# Patient Record
Sex: Male | Born: 1965
Health system: Southern US, Community
[De-identification: ages and names within clinical notes are randomized; demographics above are authoritative.]

## PROBLEM LIST (undated history)

## (undated) DIAGNOSIS — I509 Heart failure, unspecified: Secondary | ICD-10-CM

## (undated) DIAGNOSIS — E119 Type 2 diabetes mellitus without complications: Secondary | ICD-10-CM

## (undated) DIAGNOSIS — I2510 Atherosclerotic heart disease of native coronary artery without angina pectoris: Secondary | ICD-10-CM

## (undated) DIAGNOSIS — I10 Essential (primary) hypertension: Secondary | ICD-10-CM

## (undated) DIAGNOSIS — M1712 Unilateral primary osteoarthritis, left knee: Secondary | ICD-10-CM

## (undated) DIAGNOSIS — G4733 Obstructive sleep apnea (adult) (pediatric): Secondary | ICD-10-CM

## (undated) DIAGNOSIS — M1990 Unspecified osteoarthritis, unspecified site: Secondary | ICD-10-CM

## (undated) DIAGNOSIS — I639 Cerebral infarction, unspecified: Secondary | ICD-10-CM

## (undated) DIAGNOSIS — F1990 Other psychoactive substance use, unspecified, uncomplicated: Secondary | ICD-10-CM

## (undated) DIAGNOSIS — G4730 Sleep apnea, unspecified: Secondary | ICD-10-CM

## (undated) DIAGNOSIS — K219 Gastro-esophageal reflux disease without esophagitis: Secondary | ICD-10-CM

## (undated) DIAGNOSIS — E785 Hyperlipidemia, unspecified: Secondary | ICD-10-CM

## (undated) DIAGNOSIS — Z9889 Other specified postprocedural states: Secondary | ICD-10-CM

## (undated) DIAGNOSIS — R112 Nausea with vomiting, unspecified: Secondary | ICD-10-CM

## (undated) DIAGNOSIS — Z9989 Dependence on other enabling machines and devices: Secondary | ICD-10-CM

## (undated) HISTORY — PX: TOTAL SHOULDER ARTHROPLASTY: SHX126

## (undated) HISTORY — DX: Heart failure, unspecified: I50.9

## (undated) HISTORY — DX: Other psychoactive substance use, unspecified, uncomplicated: F19.90

## (undated) HISTORY — DX: Atherosclerotic heart disease of native coronary artery without angina pectoris: I25.10

## (undated) HISTORY — DX: Hyperlipidemia, unspecified: E78.5

## (undated) HISTORY — DX: Sleep apnea, unspecified: G47.30

## (undated) HISTORY — PX: KNEE ARTHROPLASTY: SHX992

## (undated) HISTORY — PX: OTHER SURGICAL HISTORY: SHX169

## (undated) HISTORY — PX: SHOULDER SURGERY: SHX246

## (undated) HISTORY — PX: APPENDECTOMY: SHX54

## (undated) HISTORY — DX: Cerebral infarction, unspecified: I63.9

## (undated) HISTORY — DX: Essential (primary) hypertension: I10

## (undated) HISTORY — PX: KNEE SURGERY: SHX244

---

## 2001-08-31 ENCOUNTER — Encounter: Admission: RE | Admit: 2001-08-31 | Discharge: 2001-08-31 | Payer: Self-pay | Admitting: Urology

## 2001-08-31 ENCOUNTER — Encounter: Payer: Self-pay | Admitting: Urology

## 2001-09-13 ENCOUNTER — Encounter: Payer: Self-pay | Admitting: Urology

## 2001-09-13 ENCOUNTER — Ambulatory Visit (HOSPITAL_COMMUNITY): Admission: RE | Admit: 2001-09-13 | Discharge: 2001-09-13 | Payer: Self-pay | Admitting: Urology

## 2002-06-27 ENCOUNTER — Encounter: Payer: Self-pay | Admitting: Neurosurgery

## 2002-06-27 ENCOUNTER — Encounter: Admission: RE | Admit: 2002-06-27 | Discharge: 2002-06-27 | Payer: Self-pay | Admitting: Neurosurgery

## 2002-07-11 ENCOUNTER — Encounter: Payer: Self-pay | Admitting: Neurosurgery

## 2002-07-11 ENCOUNTER — Encounter: Admission: RE | Admit: 2002-07-11 | Discharge: 2002-07-11 | Payer: Self-pay | Admitting: Neurosurgery

## 2002-07-26 ENCOUNTER — Encounter: Admission: RE | Admit: 2002-07-26 | Discharge: 2002-07-26 | Payer: Self-pay | Admitting: Neurosurgery

## 2002-07-26 ENCOUNTER — Encounter: Payer: Self-pay | Admitting: Neurosurgery

## 2003-12-12 ENCOUNTER — Encounter: Admission: RE | Admit: 2003-12-12 | Discharge: 2003-12-12 | Payer: Self-pay | Admitting: Neurosurgery

## 2003-12-29 ENCOUNTER — Encounter: Admission: RE | Admit: 2003-12-29 | Discharge: 2003-12-29 | Payer: Self-pay | Admitting: Neurosurgery

## 2004-01-09 ENCOUNTER — Encounter: Admission: RE | Admit: 2004-01-09 | Discharge: 2004-01-09 | Payer: Self-pay | Admitting: Neurosurgery

## 2004-06-10 ENCOUNTER — Ambulatory Visit (HOSPITAL_COMMUNITY): Admission: RE | Admit: 2004-06-10 | Discharge: 2004-06-10 | Payer: Self-pay | Admitting: Family Medicine

## 2005-03-16 ENCOUNTER — Ambulatory Visit (HOSPITAL_COMMUNITY): Admission: RE | Admit: 2005-03-16 | Discharge: 2005-03-16 | Payer: Self-pay | Admitting: Orthopedic Surgery

## 2007-01-01 ENCOUNTER — Ambulatory Visit (HOSPITAL_COMMUNITY): Admission: RE | Admit: 2007-01-01 | Discharge: 2007-01-01 | Payer: Self-pay | Admitting: Family Medicine

## 2007-01-08 ENCOUNTER — Ambulatory Visit: Payer: Self-pay | Admitting: Orthopedic Surgery

## 2007-01-22 ENCOUNTER — Ambulatory Visit: Payer: Self-pay | Admitting: Orthopedic Surgery

## 2007-03-05 ENCOUNTER — Ambulatory Visit: Payer: Self-pay | Admitting: Orthopedic Surgery

## 2007-04-23 ENCOUNTER — Ambulatory Visit: Payer: Self-pay | Admitting: Orthopedic Surgery

## 2007-05-01 ENCOUNTER — Ambulatory Visit (HOSPITAL_COMMUNITY): Admission: RE | Admit: 2007-05-01 | Discharge: 2007-05-01 | Payer: Self-pay | Admitting: Orthopedic Surgery

## 2007-05-01 ENCOUNTER — Ambulatory Visit: Payer: Self-pay | Admitting: Orthopedic Surgery

## 2007-05-03 ENCOUNTER — Ambulatory Visit: Payer: Self-pay | Admitting: Orthopedic Surgery

## 2007-05-08 ENCOUNTER — Encounter (HOSPITAL_COMMUNITY): Admission: RE | Admit: 2007-05-08 | Discharge: 2007-06-07 | Payer: Self-pay | Admitting: Orthopedic Surgery

## 2007-05-24 ENCOUNTER — Ambulatory Visit: Payer: Self-pay | Admitting: Orthopedic Surgery

## 2007-06-08 ENCOUNTER — Encounter (HOSPITAL_COMMUNITY): Admission: RE | Admit: 2007-06-08 | Discharge: 2007-07-08 | Payer: Self-pay | Admitting: Orthopedic Surgery

## 2007-06-14 ENCOUNTER — Ambulatory Visit: Payer: Self-pay | Admitting: Orthopedic Surgery

## 2007-07-12 ENCOUNTER — Ambulatory Visit: Payer: Self-pay | Admitting: Orthopedic Surgery

## 2007-08-13 ENCOUNTER — Ambulatory Visit: Payer: Self-pay | Admitting: Orthopedic Surgery

## 2008-03-05 ENCOUNTER — Ambulatory Visit (HOSPITAL_COMMUNITY): Admission: RE | Admit: 2008-03-05 | Discharge: 2008-03-05 | Payer: Self-pay | Admitting: Family Medicine

## 2009-05-13 DIAGNOSIS — I10 Essential (primary) hypertension: Secondary | ICD-10-CM | POA: Insufficient documentation

## 2009-05-14 ENCOUNTER — Encounter (INDEPENDENT_AMBULATORY_CARE_PROVIDER_SITE_OTHER): Payer: Self-pay | Admitting: *Deleted

## 2009-05-14 ENCOUNTER — Ambulatory Visit: Payer: Self-pay | Admitting: Orthopedic Surgery

## 2009-05-28 ENCOUNTER — Ambulatory Visit: Payer: Self-pay | Admitting: Orthopedic Surgery

## 2009-06-02 ENCOUNTER — Encounter (INDEPENDENT_AMBULATORY_CARE_PROVIDER_SITE_OTHER): Payer: Self-pay | Admitting: *Deleted

## 2009-06-02 ENCOUNTER — Ambulatory Visit: Payer: Self-pay | Admitting: Orthopedic Surgery

## 2009-06-05 ENCOUNTER — Encounter (INDEPENDENT_AMBULATORY_CARE_PROVIDER_SITE_OTHER): Payer: Self-pay | Admitting: *Deleted

## 2009-06-08 ENCOUNTER — Ambulatory Visit (HOSPITAL_COMMUNITY): Admission: RE | Admit: 2009-06-08 | Discharge: 2009-06-08 | Payer: Self-pay | Admitting: Orthopedic Surgery

## 2009-06-08 ENCOUNTER — Encounter (INDEPENDENT_AMBULATORY_CARE_PROVIDER_SITE_OTHER): Payer: Self-pay | Admitting: *Deleted

## 2009-06-11 ENCOUNTER — Ambulatory Visit: Payer: Self-pay | Admitting: Orthopedic Surgery

## 2009-06-11 ENCOUNTER — Encounter (INDEPENDENT_AMBULATORY_CARE_PROVIDER_SITE_OTHER): Payer: Self-pay | Admitting: *Deleted

## 2009-06-12 ENCOUNTER — Encounter: Payer: Self-pay | Admitting: Orthopedic Surgery

## 2009-06-15 ENCOUNTER — Telehealth: Payer: Self-pay | Admitting: Orthopedic Surgery

## 2009-06-17 ENCOUNTER — Encounter (INDEPENDENT_AMBULATORY_CARE_PROVIDER_SITE_OTHER): Payer: Self-pay | Admitting: *Deleted

## 2009-06-17 ENCOUNTER — Ambulatory Visit: Payer: Self-pay | Admitting: Orthopedic Surgery

## 2009-06-18 ENCOUNTER — Encounter: Payer: Self-pay | Admitting: Orthopedic Surgery

## 2009-06-23 ENCOUNTER — Ambulatory Visit: Payer: Self-pay | Admitting: Orthopedic Surgery

## 2009-06-23 ENCOUNTER — Ambulatory Visit (HOSPITAL_COMMUNITY): Admission: RE | Admit: 2009-06-23 | Discharge: 2009-06-23 | Payer: Self-pay | Admitting: Orthopedic Surgery

## 2009-06-24 ENCOUNTER — Telehealth: Payer: Self-pay | Admitting: Orthopedic Surgery

## 2009-06-25 ENCOUNTER — Ambulatory Visit: Payer: Self-pay | Admitting: Orthopedic Surgery

## 2009-06-25 ENCOUNTER — Encounter (INDEPENDENT_AMBULATORY_CARE_PROVIDER_SITE_OTHER): Payer: Self-pay | Admitting: *Deleted

## 2009-06-26 ENCOUNTER — Encounter (HOSPITAL_COMMUNITY): Admission: RE | Admit: 2009-06-26 | Discharge: 2009-07-26 | Payer: Self-pay | Admitting: Orthopedic Surgery

## 2009-07-01 ENCOUNTER — Encounter: Payer: Self-pay | Admitting: Orthopedic Surgery

## 2009-07-10 ENCOUNTER — Encounter: Payer: Self-pay | Admitting: Orthopedic Surgery

## 2009-07-16 ENCOUNTER — Ambulatory Visit: Payer: Self-pay | Admitting: Orthopedic Surgery

## 2009-07-23 ENCOUNTER — Encounter: Payer: Self-pay | Admitting: Orthopedic Surgery

## 2009-07-30 ENCOUNTER — Encounter (HOSPITAL_COMMUNITY): Admission: RE | Admit: 2009-07-30 | Discharge: 2009-08-29 | Payer: Self-pay | Admitting: Orthopedic Surgery

## 2009-08-05 ENCOUNTER — Encounter: Payer: Self-pay | Admitting: Orthopedic Surgery

## 2009-08-06 ENCOUNTER — Ambulatory Visit: Payer: Self-pay | Admitting: Orthopedic Surgery

## 2009-08-06 ENCOUNTER — Encounter (INDEPENDENT_AMBULATORY_CARE_PROVIDER_SITE_OTHER): Payer: Self-pay | Admitting: *Deleted

## 2009-08-10 ENCOUNTER — Encounter: Payer: Self-pay | Admitting: Orthopedic Surgery

## 2009-08-25 ENCOUNTER — Ambulatory Visit: Payer: Self-pay | Admitting: Orthopedic Surgery

## 2009-08-28 ENCOUNTER — Encounter: Payer: Self-pay | Admitting: Orthopedic Surgery

## 2009-09-02 ENCOUNTER — Ambulatory Visit: Payer: Self-pay | Admitting: Orthopedic Surgery

## 2009-09-03 ENCOUNTER — Encounter: Payer: Self-pay | Admitting: Orthopedic Surgery

## 2009-09-10 ENCOUNTER — Encounter: Payer: Self-pay | Admitting: Orthopedic Surgery

## 2009-09-10 ENCOUNTER — Telehealth: Payer: Self-pay | Admitting: Orthopedic Surgery

## 2009-09-17 ENCOUNTER — Telehealth: Payer: Self-pay | Admitting: Orthopedic Surgery

## 2009-09-17 ENCOUNTER — Encounter: Payer: Self-pay | Admitting: Orthopedic Surgery

## 2009-09-23 ENCOUNTER — Ambulatory Visit: Payer: Self-pay | Admitting: Orthopedic Surgery

## 2009-09-24 ENCOUNTER — Encounter: Payer: Self-pay | Admitting: Orthopedic Surgery

## 2009-09-28 ENCOUNTER — Ambulatory Visit: Payer: Self-pay | Admitting: Orthopedic Surgery

## 2009-09-28 DIAGNOSIS — M1711 Unilateral primary osteoarthritis, right knee: Secondary | ICD-10-CM | POA: Insufficient documentation

## 2009-10-05 ENCOUNTER — Ambulatory Visit: Payer: Self-pay | Admitting: Orthopedic Surgery

## 2009-10-12 ENCOUNTER — Ambulatory Visit: Payer: Self-pay | Admitting: Orthopedic Surgery

## 2009-10-23 ENCOUNTER — Telehealth: Payer: Self-pay | Admitting: Orthopedic Surgery

## 2009-11-16 ENCOUNTER — Ambulatory Visit: Payer: Self-pay | Admitting: Orthopedic Surgery

## 2009-11-17 ENCOUNTER — Encounter: Payer: Self-pay | Admitting: Orthopedic Surgery

## 2009-12-31 ENCOUNTER — Encounter: Payer: Self-pay | Admitting: Orthopedic Surgery

## 2010-03-16 ENCOUNTER — Ambulatory Visit: Payer: Self-pay | Admitting: Orthopedic Surgery

## 2010-07-21 ENCOUNTER — Ambulatory Visit: Payer: Self-pay | Admitting: Orthopedic Surgery

## 2010-11-22 ENCOUNTER — Emergency Department (HOSPITAL_COMMUNITY): Admission: EM | Admit: 2010-11-22 | Discharge: 2010-11-22 | Payer: Self-pay | Admitting: Emergency Medicine

## 2010-11-23 ENCOUNTER — Ambulatory Visit (HOSPITAL_COMMUNITY)
Admission: RE | Admit: 2010-11-23 | Discharge: 2010-11-23 | Payer: Self-pay | Source: Home / Self Care | Admitting: Emergency Medicine

## 2010-11-30 ENCOUNTER — Encounter: Payer: Self-pay | Admitting: Orthopedic Surgery

## 2010-12-07 ENCOUNTER — Ambulatory Visit: Payer: Self-pay | Admitting: Orthopedic Surgery

## 2010-12-07 DIAGNOSIS — M25819 Other specified joint disorders, unspecified shoulder: Secondary | ICD-10-CM | POA: Insufficient documentation

## 2010-12-07 DIAGNOSIS — M7580 Other shoulder lesions, unspecified shoulder: Secondary | ICD-10-CM

## 2010-12-29 ENCOUNTER — Telehealth: Payer: Self-pay | Admitting: Orthopedic Surgery

## 2011-01-12 ENCOUNTER — Ambulatory Visit
Admission: RE | Admit: 2011-01-12 | Discharge: 2011-01-12 | Payer: Self-pay | Source: Home / Self Care | Attending: Orthopedic Surgery | Admitting: Orthopedic Surgery

## 2011-01-21 ENCOUNTER — Encounter (INDEPENDENT_AMBULATORY_CARE_PROVIDER_SITE_OTHER): Payer: Self-pay | Admitting: *Deleted

## 2011-01-24 ENCOUNTER — Telehealth: Payer: Self-pay | Admitting: Orthopedic Surgery

## 2011-01-25 ENCOUNTER — Ambulatory Visit (HOSPITAL_COMMUNITY)
Admission: RE | Admit: 2011-01-25 | Discharge: 2011-01-25 | Payer: Self-pay | Source: Home / Self Care | Attending: Orthopedic Surgery | Admitting: Orthopedic Surgery

## 2011-01-25 NOTE — Letter (Signed)
Summary: Out of Work  [facility]  [address]  [place], [place] [address]   Phone: [phone]  Fax: [phone]    September 17, 2009   Employee:  Nicholas Delacruz    To Whom It May Concern:   For Medical reasons, please excuse the above named employee from work for the following dates:  Start:  4 hours 09/21/18 - 09/25/09  End:    Full duty starting 09/28/09  If you need additional information, please feel free to contact our office.         Sincerely,   Dr. Vickki Hearing

## 2011-01-25 NOTE — Assessment & Plan Note (Signed)
Summary: POST OP 1/LT KNEE ARTH/SURG 06/23/09/[facility]/CAF   Visit Type:  post op    History of Present Illness: I saw Nicholas Delacruz back in the office today.  He is now POST OP day 2  LEFT KNEE ARTHROSCOPY.  microfracture and medial meisectomy   06/23/09 DOS.  POD 2.  PT starts 06/26/09 230pm.  Takes pain med one at a time, Percocet,  op report not for review yet.  No Ibuprofen has been taken, in right much pain.     Allergies: 1)  ! Levaquin  Physical Exam  Additional Exam:  ROM = 5-75  incisions are normal    Impression & Recommendations:  Problem # 1:  TEAR MEDIAL MENISCUS (ICD-836.0)  Orders: Post-Op Check (25956)  Problem # 2:  KNEE PAIN (ICD-719.46)  His updated medication list for this problem includes:    Norco 5-325 Mg Tabs (Hydrocodone-acetaminophen) .Marland Kitchen... 1 q 4 as needed pain  Orders: Post-Op Check (38756)  Patient Instructions: 1)  Physical therapy start 2)  8 hours / day in the CPM  3)  WBAT in brace and crutches  4)  return in 3 weeks  5)  OOW 6 weeks

## 2011-01-25 NOTE — Assessment & Plan Note (Signed)
Summary: HAS FLUID ON KNEE/[facility]/BSF   Visit Type:  Follow-up, post op   CC:  left knee swelling .  History of Present Illness: I saw Nicholas Delacruz back in the office today. Having swelling of the left knee.  DX: OA left knee, S/P Microfracture and salk medial menisectomy. [06/23/2009]  Has been using ice on it since he has went back to work.  Ice helps alot.  Pain anterior knee, no injury.  Is on 4 hrs a day at work for the last 2 weeks.  Norco 5 and Advil help.  In economy hinge today left knee.  Allergies: 1)  ! Levaquin  Physical Exam  Additional Exam:  his effusion comes and goes it's relieved with ice and rest.  Seems to be brought on by weightbearing.     Impression & Recommendations:  Problem # 1:  JOINT EFFUSION, LEFT KNEE (ICD-719.06) Assessment Comment Only  continue work 4 hours per day recommend Synvisc injections start when he gets to medicine  Orders: Post-Op Check (09811)  Patient Instructions: 1)  continue 4 hrs per day work  2)  [ordering SYNVISC]

## 2011-01-25 NOTE — Letter (Signed)
Summary: [facility] disab form faxed 06/01/09  [facility] disab form faxed 06/01/09   Imported By: Cammie Sickle 06/02/2009 17:53:32  _____________________________________________________________________  External Attachment:    Type:   Image     Comment:   External Document

## 2011-01-25 NOTE — Assessment & Plan Note (Signed)
Summary: SYNVISC INJECTION/BSF   Visit Type:  Follow-up  CC:  left knee pain.  History of Present Illness: I saw Nicholas Delacruz in the office today for a followup visit.  He is a 45 years old man with the complaint of:  LEFT knee  YN:WGNF knee arthritis status post meniscectomy  Treatment:rest, injection of cortisone   MEDS: no current meds  Complaints:some of the swelling is gone down  Today, scheduled for:  INJECTION.of Synvisc   Allergies: 1)  ! Levaquin   Impression & Recommendations:  Problem # 1:  TEAR MEDIAL MENISCUS (ICD-836.0) inject one vial Synvisc LEFT knee sterile technique no complications  Problem # 2:  KNEE, ARTHRITIS, DEGEN./OSTEO (ICD-715.96)  His updated medication list for this problem includes:    Norco 5-325 Mg Tabs (Hydrocodone-acetaminophen) .Marland Kitchen... 1 q 4 as needed pain  Orders: Synvisc (20610M)  Patient Instructions: 1)  You have received an injection of cortisone today. You may experience increased pain at the injection site. Apply ice pack to the area for 20 minutes every 2 hours and take 2 xtra strength tylenol every 8 hours. This increased pain will usually resolve in 24 hours. The injection will take effect in 3-10 days.   2)  1 week SYNVISC

## 2011-01-25 NOTE — Letter (Signed)
Summary: Out of Work and return full duty work note  [facility]  [address]  [place], [place] [address]   Phone: [phone]  Fax: [phone]    November 16, 2009   Employee:  Nicholas Delacruz    To Whom It May Concern:   For Medical reasons, please excuse the above named employee from work for the following dates:  Start:   November 16, 2009  End:   November 20, 2009  Return to full duty work schedule, no restrictions:  November 23, 2009   If you need additional information, please feel free to contact our office.         Sincerely,    Terrance Mass, MD

## 2011-01-25 NOTE — Letter (Signed)
Summary: [facility] disability med records req  [facility] disability med records req   Imported By: Cammie Sickle 07/16/2009 09:50:46  _____________________________________________________________________  External Attachment:    Type:   Image     Comment:   External Document

## 2011-01-25 NOTE — Assessment & Plan Note (Signed)
Summary: LEFT KNEE PAIN/NO XR/NO INJURY/[facility]/BSF   History of Present Illness: I saw Nicholas Delacruz in the office today for a followup visit.  He is a 45 years old man with the complaint of:  new problem, left knee pain.  [name] is here for a new problem.  We last saw him in 2008.  He status post RIGHT knee surgery with arthroscopy and debridement.  Presents with 2 weeks of pain LEFT knee after fishing trip.  No trauma.  Complains of anterior knee pain no swelling.  After fishing or the day had trouble getting out of the boat and the knee was painful and he couldn't walk.  He has been on home confinement with rest for the last 2 week   Allergies (verified): 1)  ! Levaquin 2)  ! Vicodin  Past History:  Past Medical History:    High blood pressure     (05/13/2009)  Past Surgical History:    Right knee (05/13/2009)  Risk Factors:    Alcohol Use: N/A    >5 drinks/d w/in last 3 months: N/A    Caffeine Use: N/A    Diet: N/A    Exercise: N/A  Risk Factors:    Smoking Status: N/A    Packs/Day: N/A    Cigars/wk: N/A    Pipe Use/wk: N/A    Cans of tobacco/wk: N/A    Passive Smoke Exposure: N/A  Review of Systems MS:  See HPI.  The review of systems is negative for General, Cardiac , Resp, GI, GU, Neuro, Endo, Psych, Derm, EENT, Immunology, and Lymphatic.  Physical Exam  Additional Exam:  Kyheem weighs in at 290 pounds.  Pulse normal, respiratory rate 18.  This is a normal development well-nourished male grooming hygiene normal  Normal pulses perfusion both limbs.  No lymphadenopathy lower extremities.  Skin normal lower extremities.  Sensation normal lower extremity.  Minimal site exam normal.  Musculoskeletal inspection obvious limp favors LEFT leg.  RIGHT knee multiple portal holes from previous surgeries, no swelling, flexion 120, strength and muscle tone normal.  No instability.  LEFT knee medial joint line tenderness questionable McMurray's painful extension from  5-0 flexion painful at 125, collateral stable ACL PCL stable, muscle strength normal.   Impression & Recommendations:  Problem # 1:  TEAR MEDIAL MENISCUS (ICD-836.0) Assessment New  Orders: Est. Patient Level IV (84696)   3 views LEFT knee  The bone is normal the joint looks normal  Impression normal LEFT knee  Problem # 2:  KNEE PAIN (EXB-284.13) Assessment: New  Orders: Est. Patient Level IV (24401) Depo- Medrol 40mg  (J1030) Joint Aspirate / Injection, Large (02725) left knee  Verbal consent was obtained. The knee was prepped with alcohol and ethyl chloride. 1 cc of depomedrol 40mg /cc and 4 cc of lidocaine 1% was injected. there were no complications.  add ECON Hinge  I think he has a torn medial meniscus or stress reaction medial tibial plateau  Try injection, brace, rest, return to weeks to reassess may need MRI.  Problem # 4:  I think the patient has a torn medial meniscus or stress reaction medial tibialplateau   Medications Added to Medication List This Visit: 1)  Lotrel   Patient Instructions: 1)  You have received an injection of cortisone today. You may experience increased pain at the injection site. Apply ice pack to the area for 20 minutes every 2 hours and take 2 xtra strength tylenol every 8 hours. This increased pain will usually resolve in 24  hours. The injection will take effect in 3-10 days.  2)  rest x weekend  3)  recheck 2 weeks

## 2011-01-25 NOTE — Letter (Signed)
Summary: Out of Work  [facility]  [address]  [place], [place] [address]   Phone: [phone]  Fax: [phone]    August 28, 2009   Employee:  THANIEL COLUCCIO    To Whom It May Concern:   For Medical reasons, please excuse the above named employee from work for the following dates:  Start:   August 25, 2009  End:   September 02, 2009             (Next scheduled appointment:  September 02, 2009)  If you need additional information, please feel free to contact our office.         Sincerely,    Terrance Mass, MD

## 2011-01-25 NOTE — Assessment & Plan Note (Signed)
Summary: SYNVISC #3 LT KNEE/[facility]/CAF   Visit Type:  Follow-up   History of Present Illness: I saw Evo Hemme in the office today for a followup visit.  He is a 45 years old man with the complaint of: pain left knee   DX: Left knee arthritis post menisectomy.  Treatment: Surgery, Injections of Cortisone, Norco 5 in the past.  MEDS: Synvisc now.  Today, scheduled for: Synvisc number 3  Left knee.  Is better since 2nd injection, swelling is down.      Allergies: 1)  ! Levaquin   Impression & Recommendations:  Problem # 1:  KNEE, ARTHRITIS, DEGEN./OSTEO (ICD-715.96)  The patient is having pain and loss of function in the knee. Other conservative measures have failed (nsaids, rest, exercise, attempts at wt loss)   His updated medication list for this problem includes:    Norco 5-325 Mg Tabs (Hydrocodone-acetaminophen) .Marland Kitchen... 1 q 4 as needed pain  Orders: Synvisc (20610M) 1 vial of synvisc was injected into the knee.  Patient Instructions: 1)  5 weeks ck synvisc repsonse

## 2011-01-25 NOTE — Letter (Signed)
Summary: Out of Work Updated  [facility]  [address]  [place], [place] [address]   Phone: [phone]  Fax: [phone]    June 08, 2009   Employee:  Ural A Magid    To Whom It May Concern:   For Medical reasons, please excuse the above named employee from work for the following dates:  Start:   June 08, 2009  End:   June 11, 2009  If you need additional information, please feel free to contact our office.         Sincerely,    Terrance Mass, MD

## 2011-01-25 NOTE — Letter (Signed)
Summary: Out of Work  [facility]  [address]  [place], [place] [address]   Phone: [phone]  Fax: [phone]    June 11, 2009   Employee:  Nicholas Delacruz    To Whom It May Concern:   For Medical reasons, please excuse the above named employee from work for the following dates:  Start:   June 11, 2009  End:   June 15, 2009  Pt is cleared to Return to work:  June 15, 2009  If you need additional information, please feel free to contact our office.         Sincerely,    Terrance Mass, MD

## 2011-01-25 NOTE — Letter (Signed)
Summary: Out of Work  [facility]  [address]  [place], [place] [address]   Phone: [phone]  Fax: [phone]    June 17, 2009   Employee:  Zaid A Unrein    To Whom It May Concern:   For Medical reasons, please continue out of work status regarding above named employee for the following dates:  Start:   June 17, 2009  End:   July 17, 2009  Approximate Return to work date:   July 20, 2009  If you need additional information, please feel free to contact our office.         Sincerely,    Terrance Mass, MD

## 2011-01-25 NOTE — Medication Information (Signed)
Summary: RX Folder  RX Folder   Imported By: Cammie Sickle 06/03/2009 10:01:41  _____________________________________________________________________  External Attachment:    Type:   Image     Comment:   External Document

## 2011-01-25 NOTE — Progress Notes (Signed)
Summary: Office Visit  Office Visit   Imported By: Cammie Sickle 08/22/2007 14:40:59  _____________________________________________________________________  External Attachment:    Type:   Image     Comment:   office visit

## 2011-01-25 NOTE — Progress Notes (Signed)
Summary: needs note for 4 hours a day  Phone Note Call from Patient   Summary of Call: Nicholas Delacruz (09/13/1966) left a message, said his knee is hurting some and he is scheduled to resume full duty 09/23/09.  Asked if he can get a note to only work 4 hours a day for 1 week before resuming full schedule.   His # [phone] Initial call taken by: Jacklynn Ganong,  September 10, 2009 7:34 AM  Follow-up for Phone Call        ok Follow-up by: Fuller Canada MD,  September 10, 2009 8:22 AM  Additional Follow-up for Phone Call Additional follow up Details #1::        Patient advised to pick up the note Additional Follow-up by: Jacklynn Ganong,  September 10, 2009 2:27 PM

## 2011-01-25 NOTE — Letter (Signed)
Summary: [facility] medical rec req office notes  [facility] medical rec req office notes   Imported By: Cammie Sickle 10/10/2009 21:16:47  _____________________________________________________________________  External Attachment:    Type:   Image     Comment:   External Document

## 2011-01-25 NOTE — Assessment & Plan Note (Signed)
Summary: REVIEW MRI RESULTS [facility],LT KNEE/PT BROUGHT FILMS/[facility]HC/CAF   Visit Type:  Post imaging followup  CC:  left knee pain.  History of Present Illness: A 45 year old male complained of LEFT knee pain and had MRI @[facility]  on 06-08-2009.  MRI  1.  Flap tear of the body the medial meniscus with meniscal fragment displaced along the medial tibial plateau as above. 2.  Degenerative change most notable in the medial patellofemoral compartments.  Norco 5/325 for pain, has not taken any lately.  Has been out of work, since 06/09/09 feels better.  Not a constant pain anymore.  last visit:  Injection did not help much.       Current Medications (verified): 1)  Lotrel 2)  Norco 5-325 Mg Tabs (Hydrocodone-Acetaminophen) .Marland Kitchen.. 1 Q 4 As Needed Pain  Allergies (verified): 1)  ! Levaquin  Past History:  Past medical, surgical, family and social histories (including risk factors) reviewed, and no changes noted (except as noted below).  Past Medical History: Reviewed history from 05/13/2009 and no changes required. High blood pressure  Past Surgical History: Reviewed history from 05/13/2009 and no changes required. Right knee  Family History: Reviewed history and no changes required.  Social History: Reviewed history and no changes required.  Review of Systems MS:  See HPI.   Impression & Recommendations:  Problem # 1:  TEAR MEDIAL MENISCUS (ICD-836.0)  The MRI was done at [facility] and it was reviewed with the report  meniscal tear However, now he is less symptomatic; wants to wait on any surgery.   Orders: Est. Patient Level III (21308)  Patient Instructions: 1)  RTW on Mon Jun 21  2)  [if symptoms return, we'll schedule for surgery]

## 2011-01-25 NOTE — Letter (Signed)
Summary: Updated Out of Work Note w/Return to Work [id number]  [facility]  [address]  [place], [place] [address]   Phone: [phone]  Fax: [phone]    November 17, 2009   Employee:  Nicholas Delacruz    To Whom It May Concern:   For Medical reasons, please note:  The above named employee is to continue the work restrictions, 4-hours per day work, for the following dates:  Start:   November 16, 2009  End:   November 20, 2009   Medically cleared to return to full duty work schedule, no restrictions:   November 23, 2009    If you need additional information, please feel free to contact our office.         Sincerely,    Terrance Mass, MD

## 2011-01-25 NOTE — Miscellaneous (Signed)
Summary: PT Clinical evaluation  PT Clinical evaluation   Imported By: Jacklynn Ganong 08/05/2009 12:11:46  _____________________________________________________________________  External Attachment:    Type:   Image     Comment:   External Document

## 2011-01-25 NOTE — Assessment & Plan Note (Signed)
Summary: RE-CK LEFT KNEE/HAD XR 5/20 VISIT/[facility]/CAF   Visit Type:  Follow-up   History of Present Illness: I saw Nicholas Delacruz in the office today for a followup visit.  He is a 45 years old man with the complaint of:  2 week  recheck left knee after injection.  Injection did not help much.  Pain is medial, pain is continuous.  The more he is up on his knee the worse it gets.  Having some swelling, ice helps with pain and swelling.  Advil 400mg  two times a day, does not help.  HISTORY I saw Nicholas Delacruz in the office today for a followup visit.  He is a 45 years old man with the complaint of:  new problem, left knee pain.  [name] is here for a new problem.  We last saw him in 2008.  He status post RIGHT knee surgery with arthroscopy and debridement.  Presents with 2 weeks of pain LEFT knee after fishing trip.  No trauma.  Complains of anterior knee pain no swelling.  After fishing or the day had trouble getting out of the boat and the knee was painful and he couldn't walk.  He has been on home confinement with rest for the last 2 week   Current Medications (verified): 1)  Lotrel 2)  Norco 5-325 Mg Tabs (Hydrocodone-Acetaminophen) .Marland Kitchen.. 1 Q 4 As Needed Pain  Allergies: 1)  ! Levaquin  Past History:  Past Medical History: Last updated: 05/13/2009 High blood pressure  Past Surgical History: Last updated: 05/13/2009 Right knee  Physical Exam  Additional Exam:  Normal pulses perfusion both limbs.  No lymphadenopathy lower extremities.  Skin normal lower extremities.  Sensation normal lower extremity.  Minimal site exam normal.  Musculoskeletal inspection obvious limp favors LEFT leg.  RIGHT knee multiple portal holes from previous surgeries, no swelling, flexion 120, strength and muscle tone normal.  No instability.  LEFT knee medial joint line tenderness questionable McMurray's painful extension from 5-0 flexion painful at 125, collateral stable ACL PCL stable,  muscle strength normal.   Impression & Recommendations:  Problem # 1:  TEAR MEDIAL MENISCUS (ICD-836.0) Assessment Comment Only  Orders: Est. Patient Level III ([address])  Problem # 2:  KNEE PAIN (ICD-719.46) Assessment: Comment Only  His updated medication list for this problem includes:    Norco 5-325 Mg Tabs (Hydrocodone-acetaminophen) .Marland Kitchen... 1 q 4 as needed pain  Orders: Est. Patient Level III (60454)  Medications Added to Medication List This Visit: 1)  Norco 5-325 Mg Tabs (Hydrocodone-acetaminophen) .Marland Kitchen.. 1 q 4 as needed pain  Patient Instructions: 1)  MRI left knee  2)  OOW to Monday  Prescriptions: NORCO 5-325 MG TABS (HYDROCODONE-ACETAMINOPHEN) 1 q 4 as needed pain  #40 x 1   Entered and Authorized by:   Fuller Canada MD   Signed by:   Fuller Canada MD on 06/02/2009   Method used:   Handwritten   RxID:   [id number]

## 2011-01-25 NOTE — Assessment & Plan Note (Signed)
Summary: 3 wk RE-CK LT KNEE/ARTH SURG 06/23/09/[facility]/CAF   Visit Type:  postop visit   History of Present Illness: I saw Nicholas Delacruz in the office today for a followup visit.  He is a 45 years old man with the complaint of:    POST OP day 23   LEFT KNEE ARTHROSCOPY.  microfracture and medial meisectomy   06/23/09 DOS.  PT starts 06/26/09 230pm.  meds: percocet not taking any more  Just soreness at this time, and weakness     Allergies: 1)  ! Levaquin  Physical Exam  Additional Exam:  clinical exam shows flexion is 120, full extension, and some quadriceps weakness. He is on crutches with theT. scope brace   Impression & Recommendations:  Problem # 1:  TEAR MEDIAL MENISCUS (ICD-836.0)  Orders: Post-Op Check (29562)  Problem # 2:  KNEE PAIN (ICD-719.46)  His updated medication list for this problem includes:    Norco 5-325 Mg Tabs (Hydrocodone-acetaminophen) .Marland Kitchen... 1 q 4 as needed pain he can use the crutches, continue the brace for 3 more weeks and remove it on next visit continue therapy  Orders: Post-Op Check (13086)  Patient Instructions: 1)  3 weeks  2)  return for re eval  3)  continue the PT  4)

## 2011-01-25 NOTE — Assessment & Plan Note (Signed)
Summary: RE-CHECK LT KNEE/MAY HAVE TWISTED/NEED XR/POST OP 06/23/09/[facility]HC...   Visit Type:  Follow-up, post op  CC:  kneeinj.  History of Present Illness: I saw Nicholas Delacruz in the office today for a followup visit.  He is a 45 years old man with the complaint of:  DOS 06/23/09  Procedure Left knee arthroscopy, microfracture and medial menisectomy Medication NONE  Is using economy hinge brace and taking PT.  Injured his knee 9 days ago, he was at the refrigerator and his knee gave away and hit his knee on the fridge, had swelling the next day and will need new xrays today.  Feels like something is loose in his knee and grinding when bending knee.  He has swelling.  Has not done any exercises in a week.        Allergies: 1)  ! Levaquin  Physical Exam  Additional Exam:  he has a large joint effusion, LEFT knee. His range of motion is limited by the joint effusion, 100 of knee flexion is lateral joint line tenderness.     Impression & Recommendations:  Problem # 1:  KNEE PAIN (ICD-719.46)  knee aspiration 45 cc of fluid from the left knee   Verbal consent was obtained. The knee was prepped with alcohol and ethyl chloride. 1 cc of depomedrol 40mg /cc and 4 cc of lidocaine 1% was injected. there were no complications.  His updated medication list for this problem includes:    Norco 5-325 Mg Tabs (Hydrocodone-acetaminophen) .Marland Kitchen... 1 q 4 as needed pain  Orders: Post-Op Check (40347) Depo- Medrol 40mg  (J1030) Joint Aspirate / Injection, Large (20610)  Problem # 2:  JOINT EFFUSION, LEFT KNEE (ICD-719.06)  Orders: Post-Op Check (42595) Depo- Medrol 40mg  (J1030) Joint Aspirate / Injection, Large (20610)  Patient Instructions: 1)  REST  2)  CRYOCUFF X 3 DAYS  3)  WEAR THE BRACE  4)  RETURN IN A WEEK  5)  OOW STAY OUT TILL NEXT TUES

## 2011-01-25 NOTE — Letter (Signed)
Summary: [facility] medical records fax disability   [facility] medical records fax disability   Imported By: Cammie Sickle 06/15/2009 13:47:27  _____________________________________________________________________  External Attachment:    Type:   Image     Comment:   External Document

## 2011-01-25 NOTE — Letter (Signed)
Summary: Medical records fax [facility]  Medical records fax [facility]   Imported By: Cammie Sickle 12/23/2009 11:35:52  _____________________________________________________________________  External Attachment:    Type:   Image     Comment:   External Document

## 2011-01-25 NOTE — Op Note (Signed)
Summary: Operative Report  Operative Report   Imported By: Cammie Sickle 08/22/2007 14:43:40  _____________________________________________________________________  External Attachment:    Type:   Image     Comment:   sark

## 2011-01-25 NOTE — Assessment & Plan Note (Signed)
Summary: 3 WK RE-CK LT KNEE/POST OP ARTH SURG 06/23/09/[facility]F   Visit Type:  Follow-up   History of Present Illness: I saw Nicholas Delacruz in the office today for a followup visit.  He is a 45 years old man with the complaint of:  3 week recheck left knee post op after taking PT.   LEFT KNEE ARTHROSCOPY.  microfracture and medial meisectomy   06/23/09 DOS.  In post op brace today.  Therapy is going ok.  Has a limp because he has not been very active.  His knee flexion is good.  PT put a weight on his leg for the last 2 weeks, caused medial knee pain.  Has pain walking after 112 steps or so, no pain any other time.      HISTORY I saw Nicholas Delacruz in the office today for a followup visit.  He is a 45 years old man with the complaint of:    POST OP day 23   LEFT KNEE ARTHROSCOPY.  microfracture and medial meisectomy   06/23/09 DOS.  PT starts 06/26/09 230pm.  meds: percocet not taking any more  Just soreness at this time, and weakness     Allergies: 1)  ! Levaquin  Physical Exam  Additional Exam:  knee looks good -no swelling  -mild med lateral joint pain -FROM   Impression & Recommendations:  Problem # 1:  KNEE PAIN (ICD-719.46)  His updated medication list for this problem includes:    Norco 5-325 Mg Tabs (Hydrocodone-acetaminophen) .Marland Kitchen... 1 q 4 as needed pain  Orders: Post-Op Check (51884)  Patient Instructions: 1)  Use economy hinge brace for 6 weeks 2)  Come back in 3 weeks 3)  No more weights in therapy, will advise [facility] therapy. 4)  OOW for 3 more weeks

## 2011-01-25 NOTE — Letter (Signed)
Summary: Out of Work  [facility]  [address]  [place], [place] [address]   Phone: [phone]  Fax: [phone]    September 23, 2009   Employee:  Lynk A Cordner    To Whom It May Concern:   For Medical reasons, please excuse the above named employee from work for the following dates:  As of today, September 23, 2009,  continue work status as follows:  4 hours per day  End date/ Approximate Return to work date:  October 26, 2009, or until further notice.     If you need additional information, please feel free to contact our office.         Sincerely,    Terrance Mass, MD

## 2011-01-25 NOTE — Progress Notes (Signed)
Summary: Can he shower?  Phone Note Call from Patient   Summary of Call: Nicholas Delacruz (11/02/1966) wants to know if he can take a shower.  Had surgery yesterday. His # [phone] Initial call taken by: Jacklynn Ganong,  June 24, 2009 2:08 PM  Follow-up for Phone Call        yes Follow-up by: Fuller Canada MD,  June 24, 2009 2:22 PM  Additional Follow-up for Phone Call Additional follow up Details #1::        advised patient Additional Follow-up by: Jacklynn Ganong,  June 24, 2009 2:28 PM

## 2011-01-25 NOTE — Progress Notes (Signed)
Summary: Office Visit  Office Visit   Imported By: Cammie Sickle 08/22/2007 14:38:15  _____________________________________________________________________  External Attachment:    Type:   Image     Comment:   initial visit

## 2011-01-25 NOTE — Miscellaneous (Signed)
Summary: PT progress note  PT progress note   Imported By: Jacklynn Ganong 07/17/2009 09:01:05  _____________________________________________________________________  External Attachment:    Type:   Image     Comment:   External Document

## 2011-01-25 NOTE — Assessment & Plan Note (Signed)
Summary: LT KNEE PAIN,FLUID/S/P FALL 03/13/10/NEED XRAY/[facility]   Visit Type:  Follow-up Primary Provider:  cresenzo  CC:  left knee pain.  History of Present Illness: Nicholas Delacruz is 45 years old we've seen him in the past for various knee problems but he comes in today because he fell and landed on his LEFT knee and had swelling in the front of the knee anterior to the patella he was injured on March 19.  He said the swelling started go down once he started to ice it.  He denies any catching locking or giving way.  He is concerned that he might have some fluid on the knee and wanted it drawn off if needed   review of systems as stated.  Exam he is ambulating well he has some swelling in the prepatellar bursa and tenderness over the anterior part of his patella the joint has a trace amount of fluid.  He has no joint line tenderness his knee is very stable and has no meniscal signs.  There is an abrasion over the shin bone from the fall sensation is normal his pulses are excellent  He's awake alert is oriented x3 his mood and affect are normal his grooming and hygiene are normal he has normal development  X-rays were obtained 3 views of the LEFT knee  There is medial joint space narrowing it is mild he has some patellofemoral arthritis evidence by spurs  Impression mild arthritis LEFT knee no acute trauma  Plan continue to ice keep previous appointment if no previous appointment make one as needed.  Allergies: 1)  ! Levaquin   Impression & Recommendations:  Problem # 1:  CONTUSION, LEFT KNEE (ICD-924.11) Assessment New  Orders: Est. Patient Level III (91478) Knee x-ray,  3 views (29562)  Problem # 2:  KNEE PAIN (ZHY-865.78) Assessment: New  His updated medication list for this problem includes:    Norco 5-325 Mg Tabs (Hydrocodone-acetaminophen) .Marland Kitchen... 1 q 4 as needed pain  Orders: Est. Patient Level III (46962) Knee x-ray,  3 views (95284)  Patient Instructions: 1)   Continue the ice until the swelling goes down 2)  f/u keep previous appt if no prev appt make one as needed

## 2011-01-25 NOTE — Miscellaneous (Signed)
Summary: mri appt 06/08/09 1130am  Clinical Lists Changes   No precert needed for [facility]   P and G, AP[facility] for MRI 06/08/09, reg at 1130am.  Left message on patients machine to call us to set up results appt in our office, also left radiology number if needs to reschedule.

## 2011-01-25 NOTE — Letter (Signed)
Summary: [facility] Medical record request  [facility] record request   Imported By: Cammie Sickle 09/10/2009 18:29:47  _____________________________________________________________________  External Attachment:    Type:   Image     Comment:   External Document

## 2011-01-25 NOTE — Letter (Signed)
Summary: [employer] disability medical record fax  [employer] disability medical record fax   Imported By: Cammie Sickle 07/17/2009 20:09:06  _____________________________________________________________________  External Attachment:    Type:   Image     Comment:   External Document

## 2011-01-25 NOTE — Assessment & Plan Note (Signed)
Summary: 1 Wk [name] left knee/post op/surg 06/23/09/[facility]   Visit Type:  Follow-up  CC:  left knee pain.  History of Present Illness: I saw Nicholas Delacruz in the office today for a 1 week  followup visit.  He is a 45 years old man with the complaint of:  left knee.  DX: OA LEFT KNEE; S/P MICROFRACTURE AND SALK  MEDIAL MENISECTOMY   Treatment:  OOW FOR 1 WEEK   MEDS: NOTHING   Complaints:   HIS KNEE IS FEELING BETTER, STILL SORE.  Today, scheduled for:  RECHECK.   Allergies: 1)  ! Levaquin  Physical Exam  Additional Exam:  much improved   ROM = 125 degrees there is no joint effusion      Impression & Recommendations:  Problem # 1:  JOINT EFFUSION, LEFT KNEE (ICD-719.06)  LEFT KNEE ECON HINGE   RTW ON 20TH SEPTEMBER   Orders: Post-Op Check (66440)  Patient Instructions: 1)  Medically cleared to return to work; return to work note provided. 2)  ON 20TH SEPT  3)  F/U WEEK OF OCT 20TH

## 2011-01-25 NOTE — Miscellaneous (Signed)
Summary: PT progress note  PT progress note   Imported By: Jacklynn Ganong 08/13/2009 11:07:00  _____________________________________________________________________  External Attachment:    Type:   Image     Comment:   External Document

## 2011-01-25 NOTE — Letter (Signed)
Summary: Out of Work for Nov 12 2009  [facility]  [address]  [place], [place] [address]   Phone: [phone]  Fax: [phone]    November 16, 2009   Employee:  Jayshawn A Latella    To Whom It May Concern:   For Medical reasons, please excuse the above named employee from work for the following dates:  Start:   Thursday, November 12, 2009  End/Return to work:   Friday, November 13 2009, 4 hour work day restriction.   (Next scheduled appointment:  Monday, November 16, 2009)   If you need additional information, please feel free to contact our office.         Sincerely,    Terrance Mass, MD

## 2011-01-25 NOTE — Letter (Signed)
Summary: *Orthopedic No Show Letter  [facility]  [address]  [place], [place] [address]   Phone: [phone]  Fax: [phone]      12/31/2009   Nicholas Delacruz [address] [place], [place]  [address]    Dear Nicholas Delacruz,   Our records indicate that you missed your scheduled appointment with Dr. Beaulah Corin on Monday, 12/28/2009.  Please contact this office to reschedule your appointment as soon as possible.  It is important that you keep your scheduled appointments with your physician, so we can provide you the best care possible.  We have enclosed an appointment card for your convenience.      Sincerely,    Dr. Terrance Mass, MD [facility] Phone [phone]

## 2011-01-25 NOTE — Letter (Signed)
Summary: Medical record fax [facility] [employer]  Medical record fax [employer] dis[employer] ins   Imported By: Cammie Sickle 09/02/2009 11:21:43  _____________________________________________________________________  External Attachment:    Type:   Image     Comment:   External Document

## 2011-01-25 NOTE — Assessment & Plan Note (Signed)
Summary: SYNVISC #2 INJEC/LT KNEE/[facility]/CAF   History of Present Illness: I saw Nicholas Delacruz in the office today for a followup visit.  He is a 45 years old man with the complaint of:  DX: Left knee arthritis post menisectomy.  Treatment: Surgery, Injections of Cortisone, Norco 5 in the past.  MEDS: Synvisc now.  Today, scheduled for: Synvisc number 2  Left knee.  No relief since the 1st injection.     Allergies: 1)  ! Levaquin   Impression & Recommendations:  Problem # 1:  KNEE, ARTHRITIS, DEGEN./OSTEO (ICD-715.96)  sterile technique was used to inject one vial Synvisc in the LEFT knee for the second time   His updated medication list for this problem includes:    Norco 5-325 Mg Tabs (Hydrocodone-acetaminophen) .Marland Kitchen... 1 q 4 as needed pain  Orders: Synvisc (20610M)  Patient Instructions: 1)  You have received an injection of cortisone today. You may experience increased pain at the injection site. Apply ice pack to the area for 20 minutes every 2 hours and take 2 xtra strength tylenol every 8 hours. This increased pain will usually resolve in 24 hours. The injection will take effect in 3-10 days.   2)  synvisc # 3 next week

## 2011-01-25 NOTE — Letter (Signed)
Summary: Surgery order LT knee surg sched 06/23/09  Surgery order LT knee surg sched 06/23/09   Imported By: Cammie Sickle 06/18/2009 19:42:04  _____________________________________________________________________  External Attachment:    Type:   Image     Comment:   External Document

## 2011-01-25 NOTE — Letter (Signed)
Summary: Out of Work  [facility]  [address]  [place], [place] [address]   Phone: [phone]  Fax: [phone]    June 02, 2009   Employee:  Nicholas Delacruz    To Whom It May Concern:   For Medical reasons, please excuse the above named employee from work for the following dates:  Start:   June 02, 2009  End:   June 08, 2009  Return to work:   June 08, 2009  If you need additional information, please feel free to contact our office.         Sincerely,    Terrance Mass, MD

## 2011-01-25 NOTE — Letter (Signed)
Summary: Out of Work  [facility]  [address]  [place], [place] [address]   Phone: [phone]  Fax: [phone]    September 10, 2009   Employee:  Caliph A Rumble    To Whom It May Concern:   For Medical reasons, please excuse the above named employee from work for the following dates:  Start: 4 Hours 09/14/09 - 09/18/09  End: Full duty starting 09/21/09    If you need additional information, please feel free to contact our office.         Sincerely,    Dt. Vickki Hearing

## 2011-01-25 NOTE — Letter (Signed)
Summary: Out of Work  [facility]  [address]  [place], [place] [address]   Phone: [phone]  Fax: [phone]    September 02, 2009   Employee:  Nicholas Delacruz    To Whom It May Concern:   For Medical reasons, please excuse the above named employee from work for the following dates:  Start:     September 02, 2009  Return to Work, [employer], no restrictions:   September 14, 2009  If you need additional information, please feel free to contact our office.         Sincerely,    Terrance Mass, MD

## 2011-01-25 NOTE — Letter (Signed)
Summary: Out of Work  [facility]  [address]  [place], [place] [address]   Phone: [phone]  Fax: [phone]    August 06, 2009   Employee:  Graeson A Sleight    To Whom It May Concern:   For Medical reasons, please excuse the above named employee from work for the following dates:  Start:   August 06, 2009  End:   August 27, 2009  Next scheduled appointment:  August 27, 2009   If you need additional information, please feel free to contact our office.         Sincerely,    Terrance Mass, MD

## 2011-01-25 NOTE — Progress Notes (Signed)
Summary: Office notes faxed to [employer] disab insurer  Phone Note Outgoing Call   Call placed to: Insurer Summary of Call: Faxed to [employer] disability insurer, last 3 office notes + most recent work note, DOS 09/28/09, 10/05/09, 10/12/09, to fax  # [phone], as per request.  Patient notified.  Auth on file. Initial call taken by: Cammie Sickle,  October 23, 2009 12:10 PM

## 2011-01-25 NOTE — Letter (Signed)
Summary: [facility] medical record request fax  [employer] medical record request fax   Imported By: Cammie Sickle 06/09/2009 19:54:58  _____________________________________________________________________  External Attachment:    Type:   Image     Comment:   External Document

## 2011-01-25 NOTE — Assessment & Plan Note (Signed)
Summary: RE-CK KNEE/DISCUSS SURGERY/[facility]/CAF   Visit Type:  preop   CC:  pain knee .  History of Present Illness: I saw Nicholas Delacruz in the office today for a followup visit.  He is a 45 years old man with the complaint of:  recheck left knee, schedule surgery.  To schedule surgery for 06/23/09.  I saw Nicholas Delacruz in the office today for a followup visit.  He is a 45 years old man with the complaint of:  new problem, left knee pain.  Nicholas Delacruz is here for a new problem.  We last saw him in 2008.  He status post RIGHT knee surgery with arthroscopy and debridement.  Presents with 2 weeks of pain LEFT knee after fishing trip.  No trauma.  Complains of anterior knee pain no swelling.  After fishing or the day had trouble getting out of the boat and the knee was painful and he couldn't walk.  He has been on home confinement with rest   We also tried injection but it did not help.  He  therefore went for MRI and it shows the following  1.  Flap tear of the body the medial meniscus with meniscal fragment displaced along the medial tibial plateau as above. 2.  Degenerative change most notable in the medial patellofemoral compartments.  Norco 5/325 for pain, has not taken any lately.         Current Medications (verified): 1)  Lotrel 2)  Norco 5-325 Mg Tabs (Hydrocodone-Acetaminophen) .Marland Kitchen.. 1 Q 4 As Needed Pain  Allergies (verified): 1)  ! Levaquin  Past History:  Past medical, surgical, family and social histories (including risk factors) reviewed, and no changes noted (except as noted below).  Past Medical History: Reviewed history from 05/13/2009 and no changes required. High blood pressure  Past Surgical History: Reviewed history from 05/13/2009 and no changes required. Right knee  Family History: Reviewed history and no changes required.  Social History: Reviewed history and no changes required.  Physical Exam  Additional Exam:  Additional Exam:  Nicholas Delacruz  weighs in at 290 pounds.  Pulse normal, respiratory rate 18.  This is a normal development well-nourished male grooming hygiene normal  Normal pulses perfusion both limbs.  No lymphadenopathy lower extremities.  Skin normal lower extremities.  Sensation normal lower extremity.  Minimal site exam normal.  Musculoskeletal inspection obvious limp favors LEFT leg.  RIGHT knee multiple portal holes from previous surgeries, no swelling, flexion 120, strength and muscle tone normal.  No instability.  LEFT knee medial joint line tenderness questionable McMurray's painful extension from 5-0 flexion painful at 125, collateral stable ACL PCL stable, muscle strength normal.    Impression & Recommendations:  Problem # 1:  TEAR MEDIAL MENISCUS (ICD-836.0)  SALK med menisectomy  Orders: Est. Patient Level IV (75643)  Patient Instructions: 1)  Informed consent process: I have discussed the procedure with the patient. I have answered their questions. The risks of bleeding, infection, nerve and vascualr injury have been discussed. The diagnosis and reason for surgery have been explained. The patient demonstrates understanding of this discussion. Specific to this procedure risks include: pain, swelling, stiffness 2)  Surgery tuesday 06/23/09 3)  preop Monday 06/22/09 at 200 pm 4)  Post op 1 in our office 06/25/09 5)  Recommended remaining out of work; out of work note provided: 6)  4 weeks

## 2011-01-25 NOTE — Miscellaneous (Signed)
Summary: PT progress note  PT progress note   Imported By: Cammie Sickle 07/17/2009 20:09:43  _____________________________________________________________________  External Attachment:    Type:   Image     Comment:   External Document

## 2011-01-25 NOTE — Progress Notes (Signed)
Summary: wants to work 4 hours for another week  Phone Note Call from Patient   Summary of Call: Nicholas Delacruz (05/25/1966) left a message that he is back at work 4 hours a day, but standing on the concrete floor is hurting his knee.  He is having some discomfort and swelling.  Says he is using ice when he gets home.  Wants to know if he can get a note to work another week at  4 hours, then start 8 hours the next week.  His # [phone]  Initial call taken by: Jacklynn Ganong,  September 17, 2009 7:57 AM  Follow-up for Phone Call        ok Follow-up by: Fuller Canada MD,  September 17, 2009 8:26 AM  Additional Follow-up for Phone Call Additional follow up Details #1::        Patient advised wants me to fax to his work  Additional Follow-up by: Jacklynn Ganong,  September 17, 2009 2:30 PM

## 2011-01-25 NOTE — Letter (Signed)
Summary: [employer] disability form med record req  [employer] disability form med record req   Imported By: Cammie Sickle 06/30/2009 18:46:56  _____________________________________________________________________  External Attachment:    Type:   Image     Comment:   External Document

## 2011-01-25 NOTE — Progress Notes (Signed)
Summary: Pt called re: symptoms recurred  Phone Note Call from Patient   Caller: Mom Summary of Call: Patient states he did not ret to work today as scheduled, as per note.  States his symptoms had returned by Saturday 06/13/09.  Schedule appt in office, next available, re: discuss surgery? Initial call taken by: Cammie Sickle,  June 15, 2009 10:47 AM  Follow-up for Phone Call        next week ok to come in Follow-up by: Chasity L Kandis Ban,  June 15, 2009 11:12 AM  Additional Follow-up for Phone Call Additional follow up Details #1::        I called patient to advise.  He states that he discussed w/Dr Romeo Apple about the surgery.  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  Asking if he can have surgery this Friday.   Please advise. Additional Follow-up by: Cammie Sickle,  June 15, 2009 3:48 PM    Additional Follow-up for Phone Call Additional follow up Details #2::    come in this week then Follow-up by: Chasity Tereasa Coop,  June 15, 2009 3:52 PM  Additional Follow-up for Phone Call Additional follow up Details #3:: Details for Additional Follow-up Action Taken: phone call completed; appt scheduled for this week Additional Follow-up by: Cammie Sickle,  June 15, 2009 4:55 PM

## 2011-01-25 NOTE — Letter (Signed)
Summary: History form  History form   Imported By: Jacklynn Ganong 05/21/2009 15:17:46  _____________________________________________________________________  External Attachment:    Type:   Image     Comment:   External Document

## 2011-01-25 NOTE — Letter (Signed)
Summary: Out of Work  [facility]  [address]  [place], [place] [address]   Phone: [phone]  Fax: [phone]    June 25, 2009   Employee:  RAYVION STUMPH    To Whom It May Concern:  Per appointment in our office today, and for medical reasons, please excuse the above named employee from work for the following dates:   Start:   06/25/09  End:   08/06/09  Estimated Return to work date:  08/10/09  If you need additional information, please feel free to contact our office.   (Next scheduled appointment: 07/16/09)         Sincerely,    Terrance Mass, MD

## 2011-01-25 NOTE — Letter (Signed)
Summary: [facility] record req fax  [facility] Medical record req fax   Imported By: Cammie Sickle 08/11/2009 10:25:11  _____________________________________________________________________  External Attachment:    Type:   Image     Comment:   External Document

## 2011-01-25 NOTE — Assessment & Plan Note (Signed)
Summary: LT KNEE PAIN/REQ'S INJECTION/UHC/CAF   Visit Type:  Follow-up Primary Provider:  cresenzo  CC:  left knee pain.  History of Present Illness: 45 year old male LEFT KNEE ARTHROSCOPY.  microfracture and medial meisectomy   06/23/09 DOS.   Today he is complaining of left knee pain, requesting injeciton.  he was with his girlfriend and aggravated his knee about 10 days ago and now his recurrent medial knee pain mild swelling would like some pain medication as well as the injection  he denies catching locking or giving way  Allergies: 1)  ! Levaquin  Physical Exam  Extremities:  he is ambulating normally he has medial joint line tenderness no effusion his range of motion is normal strength is good and his knee is stable his meniscal signs are normal he has good pulses and perfusion and normal sensation in the RIGHT lower extremity   Impression & Recommendations:  Problem # 1:  KNEE PAIN (ICD-719.46) Assessment Deteriorated  LEFT knee joint injection.  Lateral approach.  Verbal consent was obtained. The knee was prepped with alcohol and ethyl chloride. 1 cc of depomedrol 40mg /cc and 4 cc of lidocaine 1% was injected. there were no complications.  His updated medication list for this problem includes:    Norco 5-325 Mg Tabs (Hydrocodone-acetaminophen) .Marland Kitchen... 1 q 4 as needed pain  Orders: Est. Patient Level III ([address]109) Joint Aspirate / Injection, Large (20610) Depo- Medrol 40mg  (J1030)  Problem # 2:  KNEE, ARTHRITIS, DEGEN./OSTEO (ICD-715.96) Assessment: Deteriorated  His updated medication list for this problem includes:    Norco 5-325 Mg Tabs (Hydrocodone-acetaminophen) .Marland Kitchen... 1 q 4 as needed pain  Orders: Est. Patient Level III (60454) Joint Aspirate / Injection, Large (20610) Depo- Medrol 40mg  (J1030)  Medications Added to Medication List This Visit: 1)  Ambien 10 Mg Tabs (Zolpidem tartrate)  Patient Instructions: 1)  You have received an injection of  cortisone today. You may experience increased pain at the injection site. Apply ice pack to the area for 20 minutes every 2 hours and take 2 xtra strength tylenol every 8 hours. This increased pain will usually resolve in 24 hours. The injection will take effect in 3-10 days.   2)  Please schedule a follow-up appointment as needed. Prescriptions: NORCO 5-325 MG TABS (HYDROCODONE-ACETAMINOPHEN) 1 q 4 as needed pain  #60 x 1   Entered and Authorized by:   Fuller Canada MD   Signed by:   Fuller Canada MD on 07/21/2010   Method used:   Print then Give to Patient   RxID:   [id number]

## 2011-01-25 NOTE — Letter (Signed)
Summary: Out of Work  [facility]  [address]  [place], [place] [address]   Phone: [phone]  Fax: [phone]    May 14, 2009   Employee:  Nicholas Delacruz    To Whom It May Concern:   For Medical reasons, please excuse the above named employee from work for the following dates:  Start:   May 08, 2009  End:   May 18, 2009  May resume work:   May 18, 2009  If you need additional information, please feel free to contact our office.         Sincerely,    Terrance Mass, MD
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
## 2011-01-27 NOTE — Progress Notes (Signed)
Summary: shoulder still hurting,appointment to be scheduled  Phone Note Call from Patient   Caller: Patient Summary of Call: Patient called to ask if we can go ahead & order the MRI of left shoulder as still having pain, per last office visit 12/07/10, or schedule back in office.  Per notes, states return to office 3 wks from this visit if not better.  Called back to patient to advise and offer appointment - left voice mail msg. Initial call taken by: Cammie Sickle,  December 29, 2010 1:07 PM  Follow-up for Phone Call        patient called back today and has been scheduled for appointment Follow-up by: Cammie Sickle,  January 03, 2011 10:47 AM

## 2011-01-27 NOTE — Miscellaneous (Signed)
Summary: waiting on new insurance card before scheduling MRI  Clinical Lists Changes  Patient has been advised to bring this to our office to proceed with MRI order

## 2011-01-27 NOTE — Letter (Signed)
Summary: History form  History form   Imported By: Jacklynn Ganong 12/08/2010 13:35:16  _____________________________________________________________________  External Attachment:    Type:   Image     Comment:   External Document
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
## 2011-02-01 ENCOUNTER — Encounter: Payer: Self-pay | Admitting: Orthopedic Surgery

## 2011-02-01 ENCOUNTER — Ambulatory Visit (INDEPENDENT_AMBULATORY_CARE_PROVIDER_SITE_OTHER): Payer: 59 | Admitting: Orthopedic Surgery

## 2011-02-01 DIAGNOSIS — M75120 Complete rotator cuff tear or rupture of unspecified shoulder, not specified as traumatic: Secondary | ICD-10-CM

## 2011-02-01 DIAGNOSIS — M069 Rheumatoid arthritis, unspecified: Secondary | ICD-10-CM | POA: Insufficient documentation

## 2011-02-02 NOTE — Progress Notes (Signed)
Summary: MRI appointment  Phone Note Outgoing Call   Call placed by: Waldon Reining,  January 24, 2011 12:12 PM Call placed to: Patient Action Taken: Phone Call Completed Summary of Call: I called to give the patient his MRI appointment at [facility] on 01-25-11 at 7:30 am. Patient has [employer], no precert is needed. Patient will follow up here for results.

## 2011-02-10 NOTE — Letter (Signed)
Summary: Work [facility]  [address]  [place], [place] [address]   Phone: [phone]  Fax: [phone]     Today's Date: February 01, 2011  Name of Patient: Nicholas Delacruz  The above named patient had a medical visit today.  Please take this into consideration when reviewing the time away from work, secondary to surgery:  Start: 03/11/2011  End:  05/11/2011 (estimated return to work date)                           (or until further notice)   ________________________________________________________________ ________________________________________________________________________   Sincerely,   Terrance Mass, MD
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
## 2011-03-04 ENCOUNTER — Encounter: Payer: Self-pay | Admitting: Orthopedic Surgery

## 2011-03-04 ENCOUNTER — Encounter (INDEPENDENT_AMBULATORY_CARE_PROVIDER_SITE_OTHER): Payer: Self-pay | Admitting: *Deleted

## 2011-03-07 ENCOUNTER — Encounter (INDEPENDENT_AMBULATORY_CARE_PROVIDER_SITE_OTHER): Payer: Self-pay | Admitting: *Deleted

## 2011-03-08 NOTE — Letter (Addendum)
Summary: Out of Work  [facility]  [address]  [place], [place] [address]   Phone: [phone]  Fax: [phone]    March 04, 2011   Employee:  Nicholas Delacruz    To Whom It May Concern:   For Medical reasons, please excuse the above named employee from work for the following dates:  Start:   March 20th  End:   May 23rd  If you need additional information, please feel free to contact our office.         Sincerely,   Dr. Fuller Canada

## 2011-03-09 ENCOUNTER — Telehealth: Payer: Self-pay | Admitting: Orthopedic Surgery

## 2011-03-14 ENCOUNTER — Ambulatory Visit: Payer: 59 | Admitting: Orthopedic Surgery

## 2011-03-15 ENCOUNTER — Telehealth: Payer: Self-pay | Admitting: Orthopedic Surgery

## 2011-03-15 ENCOUNTER — Encounter (HOSPITAL_COMMUNITY): Payer: 59

## 2011-03-15 LAB — CBC
HCT: 36.1 % — ABNORMAL LOW (ref 39.0–52.0)
Hemoglobin: 11.9 g/dL — ABNORMAL LOW (ref 13.0–17.0)
MCH: 29.1 pg (ref 26.0–34.0)
MCHC: 33 g/dL (ref 30.0–36.0)
MCV: 88.3 fL (ref 78.0–100.0)
Platelets: 308 10*3/uL (ref 150–400)
RBC: 4.09 MIL/uL — ABNORMAL LOW (ref 4.22–5.81)
RDW: 13.3 % (ref 11.5–15.5)
WBC: 6.7 10*3/uL (ref 4.0–10.5)

## 2011-03-15 LAB — BASIC METABOLIC PANEL
BUN: 13 mg/dL (ref 6–23)
CO2: 28 mEq/L (ref 19–32)
Calcium: 9.5 mg/dL (ref 8.4–10.5)
Chloride: 103 mEq/L (ref 96–112)
Creatinine, Ser: 0.72 mg/dL (ref 0.4–1.5)
GFR calc Af Amer: >60 mL/min (ref >60)
GFR calc non Af Amer: >60 mL/min (ref >60)
Glucose, Bld: 112 mg/dL — ABNORMAL HIGH (ref 70–99)
Potassium: 4.3 mEq/L (ref 3.5–5.1)
Sodium: 137 mEq/L (ref 135–145)

## 2011-03-15 LAB — SURGICAL PCR SCREEN
MRSA, PCR: NEGATIVE
Staphylococcus aureus: POSITIVE — AB

## 2011-03-15 NOTE — Progress Notes (Signed)
Summary: questions, re-scheduled surgery date  Phone Note Call from Patient   Caller: Patient Summary of Call: Patient called - states has spoken w/nurse and has re-scheduled surgery date to Fri, March 18, 2011. Asking if he can get approximate time that surgery will be on this day. Also requesting, if possible, Rx for hospital bed for 1 wk following surgery for [facility]. States concerned about trying to sleep in regular bed. Initial call taken by: Cammie Sickle,  March 09, 2011 10:18 AM  Follow-up for Phone Call        he can get orders from hospital, surgery is in the am time, he needs to come by here to pick up his folder this week Follow-up by: Ether Griffins,  March 09, 2011 10:55 AM  Additional Follow-up for Phone Call Additional follow up Details #1::        Advised patient; will pick up packet per above.  He asked if we had an answer about the Rx for hospital bed also. Additional Follow-up by: Cammie Sickle,  March 09, 2011 11:18 AM    Additional Follow-up for Phone Call Additional follow up Details #2::    in above note Follow-up by: Ether Griffins,  March 09, 2011 11:27 AM

## 2011-03-15 NOTE — Letter (Signed)
Summary: surgery order updated,scheduled 03/18/11  surgery order updated,scheduled 03/18/11   Imported By: Cammie Sickle 03/09/2011 13:46:08  _____________________________________________________________________  External Attachment:    Type:   Image     Comment:   External Document

## 2011-03-15 NOTE — Miscellaneous (Signed)
Summary: contact to insurer, no pre-authorization required for procedure  Clinical Lists Changes  contact to insurer, [employer] re: out-patient surgery scheduled 03/11/11, at [facility].   CPT [phone], [phone], ICD9 726.10.  Per Landry Mellow., no pre-authorization is required for out-patient procedure if within network. No notification # available - insurance representative's name and today's date, 03/07/11, 12:34pm received.

## 2011-03-16 ENCOUNTER — Encounter: Payer: Self-pay | Admitting: Orthopedic Surgery

## 2011-03-16 NOTE — Telephone Encounter (Signed)
03/15/11 5:24pm: reached insurer [employer], [phone], to check on pre-cert for re-scheduled out-patient surgery date: 03/18/11 at [facility].  Per Theotis Burrow, no pre-authorization required for out-pt procedures for in-network providers. No reference or notification# needed-her name, date and time.

## 2011-03-18 ENCOUNTER — Encounter: Payer: Self-pay | Admitting: Orthopedic Surgery

## 2011-03-18 ENCOUNTER — Ambulatory Visit (HOSPITAL_COMMUNITY)
Admission: RE | Admit: 2011-03-18 | Discharge: 2011-03-18 | Disposition: A | Discharge status: Home / Self Care | Payer: 59 | Source: Ambulatory Visit | Attending: Orthopedic Surgery | Admitting: Orthopedic Surgery

## 2011-03-18 DIAGNOSIS — Z01812 Encounter for preprocedural laboratory examination: Secondary | ICD-10-CM | POA: Insufficient documentation

## 2011-03-18 DIAGNOSIS — M75120 Complete rotator cuff tear or rupture of unspecified shoulder, not specified as traumatic: Secondary | ICD-10-CM

## 2011-03-18 DIAGNOSIS — M719 Bursopathy, unspecified: Secondary | ICD-10-CM | POA: Insufficient documentation

## 2011-03-18 DIAGNOSIS — I10 Essential (primary) hypertension: Secondary | ICD-10-CM | POA: Insufficient documentation

## 2011-03-18 DIAGNOSIS — M67919 Unspecified disorder of synovium and tendon, unspecified shoulder: Secondary | ICD-10-CM | POA: Insufficient documentation

## 2011-03-18 DIAGNOSIS — Z01818 Encounter for other preprocedural examination: Secondary | ICD-10-CM | POA: Insufficient documentation

## 2011-03-21 ENCOUNTER — Ambulatory Visit (INDEPENDENT_AMBULATORY_CARE_PROVIDER_SITE_OTHER): Payer: 59 | Admitting: Orthopedic Surgery

## 2011-03-21 DIAGNOSIS — M25519 Pain in unspecified shoulder: Secondary | ICD-10-CM

## 2011-03-21 DIAGNOSIS — S43429A Sprain of unspecified rotator cuff capsule, initial encounter: Secondary | ICD-10-CM

## 2011-03-21 DIAGNOSIS — M75102 Unspecified rotator cuff tear or rupture of left shoulder, not specified as traumatic: Secondary | ICD-10-CM

## 2011-03-21 NOTE — Progress Notes (Signed)
SEEN BY NURSE ONLY

## 2011-03-21 NOTE — Patient Instructions (Signed)
Keep arm in sling  Use ice as directed  Start PT on 03/24/11  Come back in a week for dr to check incision
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
## 2011-03-28 ENCOUNTER — Ambulatory Visit (HOSPITAL_COMMUNITY)
Admission: RE | Admit: 2011-03-28 | Discharge: 2011-03-28 | Disposition: A | Discharge status: Home / Self Care | Payer: 59 | Source: Ambulatory Visit | Attending: Orthopedic Surgery | Admitting: Orthopedic Surgery

## 2011-03-28 ENCOUNTER — Ambulatory Visit (INDEPENDENT_AMBULATORY_CARE_PROVIDER_SITE_OTHER): Payer: 59 | Admitting: Orthopedic Surgery

## 2011-03-28 DIAGNOSIS — S43429A Sprain of unspecified rotator cuff capsule, initial encounter: Secondary | ICD-10-CM

## 2011-03-28 DIAGNOSIS — I10 Essential (primary) hypertension: Secondary | ICD-10-CM | POA: Insufficient documentation

## 2011-03-28 DIAGNOSIS — M75100 Unspecified rotator cuff tear or rupture of unspecified shoulder, not specified as traumatic: Secondary | ICD-10-CM

## 2011-03-28 DIAGNOSIS — IMO0001 Reserved for inherently not codable concepts without codable children: Secondary | ICD-10-CM | POA: Insufficient documentation

## 2011-03-28 DIAGNOSIS — M25619 Stiffness of unspecified shoulder, not elsewhere classified: Secondary | ICD-10-CM | POA: Insufficient documentation

## 2011-03-28 DIAGNOSIS — M25519 Pain in unspecified shoulder: Secondary | ICD-10-CM | POA: Insufficient documentation

## 2011-03-28 DIAGNOSIS — M6281 Muscle weakness (generalized): Secondary | ICD-10-CM | POA: Insufficient documentation

## 2011-03-28 MED ORDER — HYDROCODONE-ACETAMINOPHEN 10-325 MG PO TABS: 1.0000 | ORAL_TABLET | Freq: Four times a day (QID) | ORAL | Status: AC | PRN

## 2011-03-28 NOTE — Progress Notes (Signed)
Postop visit #3 status post arthroscopy, LEFT shoulder with a mini open rotator cuff repair for an minimally retracted 1 cm tear. We used 1 suture anchor.  Patient comfortable. Start therapy today. Wound looks good. Stitches are removed.  Continue therapy wear a sling, total of 4 weeks come back in 4 weeks for reevaluation and check up

## 2011-03-29 ENCOUNTER — Telehealth: Payer: Self-pay | Admitting: Orthopedic Surgery
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
## 2011-03-29 NOTE — Telephone Encounter (Signed)
Beth, physical therapist, returned call from yesterday 03/28/11 [facility] [phone]; seeing patient there today

## 2011-03-30 ENCOUNTER — Ambulatory Visit (HOSPITAL_COMMUNITY)
Admission: RE | Admit: 2011-03-30 | Discharge: 2011-03-30 | Disposition: A | Discharge status: Home / Self Care | Payer: 59 | Source: Ambulatory Visit | Admitting: Specialist

## 2011-04-04 ENCOUNTER — Ambulatory Visit (HOSPITAL_COMMUNITY)
Admission: RE | Admit: 2011-04-04 | Discharge: 2011-04-04 | Disposition: A | Discharge status: Home / Self Care | Payer: 59 | Source: Ambulatory Visit | Attending: *Deleted | Admitting: *Deleted

## 2011-04-04 LAB — BASIC METABOLIC PANEL
BUN: 16 mg/dL (ref 6–23)
CO2: 29 mEq/L (ref 19–32)
Calcium: 9.5 mg/dL (ref 8.4–10.5)
Chloride: 101 mEq/L (ref 96–112)
Creatinine, Ser: 0.95 mg/dL (ref 0.4–1.5)
GFR calc Af Amer: >60 mL/min (ref >60)
GFR calc non Af Amer: >60 mL/min (ref >60)
Glucose, Bld: 117 mg/dL — ABNORMAL HIGH (ref 70–99)
Potassium: 4.2 mEq/L (ref 3.5–5.1)
Sodium: 137 mEq/L (ref 135–145)

## 2011-04-04 LAB — HEMOGLOBIN AND HEMATOCRIT, BLOOD
HCT: 37.2 % — ABNORMAL LOW (ref 39.0–52.0)
Hemoglobin: 12.6 g/dL — ABNORMAL LOW (ref 13.0–17.0)

## 2011-04-06 ENCOUNTER — Ambulatory Visit (HOSPITAL_COMMUNITY): Payer: 59 | Admitting: Occupational Therapy

## 2011-04-08 ENCOUNTER — Ambulatory Visit (HOSPITAL_COMMUNITY)
Admission: RE | Admit: 2011-04-08 | Discharge: 2011-04-08 | Disposition: A | Discharge status: Home / Self Care | Payer: 59 | Source: Ambulatory Visit | Admitting: Occupational Therapy

## 2011-04-11 ENCOUNTER — Ambulatory Visit (HOSPITAL_COMMUNITY)
Admission: RE | Admit: 2011-04-11 | Discharge: 2011-04-11 | Disposition: A | Discharge status: Home / Self Care | Payer: 59 | Source: Ambulatory Visit | Attending: Family Medicine | Admitting: Family Medicine

## 2011-04-13 ENCOUNTER — Ambulatory Visit (HOSPITAL_COMMUNITY)
Admission: RE | Admit: 2011-04-13 | Discharge: 2011-04-13 | Disposition: A | Discharge status: Home / Self Care | Payer: 59 | Source: Ambulatory Visit | Attending: Family Medicine | Admitting: Family Medicine

## 2011-04-15 ENCOUNTER — Ambulatory Visit (HOSPITAL_COMMUNITY): Payer: 59 | Admitting: Occupational Therapy

## 2011-04-18 ENCOUNTER — Ambulatory Visit (HOSPITAL_COMMUNITY)
Admission: RE | Admit: 2011-04-18 | Discharge: 2011-04-18 | Disposition: A | Discharge status: Home / Self Care | Payer: 59 | Source: Ambulatory Visit | Attending: Family Medicine | Admitting: Family Medicine

## 2011-04-19 ENCOUNTER — Ambulatory Visit (HOSPITAL_COMMUNITY)
Admission: RE | Admit: 2011-04-19 | Discharge: 2011-04-19 | Disposition: A | Discharge status: Home / Self Care | Payer: 59 | Source: Ambulatory Visit | Attending: Family Medicine | Admitting: Family Medicine

## 2011-04-22 ENCOUNTER — Ambulatory Visit (HOSPITAL_COMMUNITY)
Admission: RE | Admit: 2011-04-22 | Discharge: 2011-04-22 | Disposition: A | Discharge status: Home / Self Care | Payer: 59 | Source: Ambulatory Visit | Attending: Family Medicine | Admitting: Family Medicine

## 2011-04-26 ENCOUNTER — Ambulatory Visit (INDEPENDENT_AMBULATORY_CARE_PROVIDER_SITE_OTHER): Payer: 59 | Admitting: Orthopedic Surgery

## 2011-04-26 ENCOUNTER — Ambulatory Visit (HOSPITAL_COMMUNITY): Payer: 59 | Admitting: Specialist

## 2011-04-26 DIAGNOSIS — Z9889 Other specified postprocedural states: Secondary | ICD-10-CM

## 2011-04-26 NOTE — Progress Notes (Signed)
Surgery March 23  Arthroscopy and mini open rotator cuff repair with one suture anchor  Patient reached behind him for some reason and then started having pain over his lateral deltoid  Passive range of motion is normal up until we get to 130.  Abduction is normal to 90.  Deltoid repair which was a split is intact.  Not sure what happened here.  The patient was advised not to reach behind his body or externally rotate his arm he reached behind him to get the remote and here we are at this point  Recommend rest no therapy this week continue cane assisted flexion as well as pendulums  Resume therapy in a week followup in 3 weeks

## 2011-04-26 NOTE — Patient Instructions (Signed)
No therapy till Monday

## 2011-04-27 ENCOUNTER — Ambulatory Visit (HOSPITAL_COMMUNITY): Payer: 59 | Admitting: Specialist

## 2011-05-05 ENCOUNTER — Ambulatory Visit (HOSPITAL_COMMUNITY)
Admission: RE | Admit: 2011-05-05 | Discharge: 2011-05-05 | Disposition: A | Discharge status: Home / Self Care | Payer: 59 | Source: Ambulatory Visit | Attending: Family Medicine | Admitting: Family Medicine

## 2011-05-05 DIAGNOSIS — M25619 Stiffness of unspecified shoulder, not elsewhere classified: Secondary | ICD-10-CM | POA: Insufficient documentation

## 2011-05-05 DIAGNOSIS — M25519 Pain in unspecified shoulder: Secondary | ICD-10-CM | POA: Insufficient documentation

## 2011-05-05 DIAGNOSIS — I10 Essential (primary) hypertension: Secondary | ICD-10-CM | POA: Insufficient documentation

## 2011-05-05 DIAGNOSIS — M6281 Muscle weakness (generalized): Secondary | ICD-10-CM | POA: Insufficient documentation

## 2011-05-05 DIAGNOSIS — M25419 Effusion, unspecified shoulder: Secondary | ICD-10-CM | POA: Insufficient documentation

## 2011-05-05 DIAGNOSIS — Z5189 Encounter for other specified aftercare: Secondary | ICD-10-CM | POA: Insufficient documentation

## 2011-05-06 ENCOUNTER — Ambulatory Visit (HOSPITAL_COMMUNITY): Payer: 59 | Admitting: Specialist

## 2011-05-09 ENCOUNTER — Ambulatory Visit (HOSPITAL_COMMUNITY)
Admission: RE | Admit: 2011-05-09 | Discharge: 2011-05-09 | Disposition: A | Discharge status: Home / Self Care | Payer: 59 | Source: Ambulatory Visit | Attending: Family Medicine | Admitting: Family Medicine

## 2011-05-10 NOTE — H&P (Signed)
NAME:  Nicholas Delacruz, Nicholas Delacruz              ACCOUNT NO.:  [id number]   MEDICAL RECORD NO.:  [id number]          PATIENT TYPE:  AMB   LOCATION:  DAY                           FACILITY:  [facility]   PHYSICIAN:  Vickki Hearing, M.D.DATE OF BIRTH:  [birth year]   DATE OF ADMISSION:  DATE OF DISCHARGE:  LH                              HISTORY & PHYSICAL   CHIEF COMPLAINT:  Right knee pain.   BRIEF HISTORY:  He is 45 years old.  He has had four surgeries on his  right knee since 1985.  He has been treated conservatively since I have  seen him for knee pain.  His last MRI was done on January 01, 2007.  It  showed a torn lateral meniscus, irregular medial meniscus, knee  effusion, three-compartment arthritis.  I gave him injections and  analgesics.  His major complaints are pain, catching, giving, and  swelling.   REVIEW OF SYSTEMS:  Review of systems x10 negative.   ALLERGIES:  LEVAQUIN CAUSES THROAT SWELLING, VICODIN CAUSES ITCHING.   PAST MEDICAL HISTORY:  1. Hypertension.  He takes two blood pressure medications, one is      Lotrel.  2. He has had four knee surgeries as stated.   FAMILY HISTORY:  Diabetes.   SOCIAL HISTORY:  He is single.  He works at [employer].  His  grade level is 12.  He does not smoke.  He drinks approximately a pint  of alcoholic beverages per week.   PHYSICAL EXAMINATION:  VITAL SIGNS:  Weight is 280.  Pulse is 80,  respiratory rate is 18.  HEENT:  Normal.  NECK:  Supple.  CHEST:  Clear.  HEART:  Rate and rhythm normal.  ABDOMEN:  Soft.  EXTREMITIES:  Right knee exam:  Normal pulses, no varicose veins, no  peripheral edema, normal sensation, normal reflexes.  He has a painful  right lower extremity gait.  He has some mild swelling with crepitation,  lateral and medial tenderness.  Flexion 120 degrees, 5-degree loss of  extension.  Ligaments are stable.  Muscle strength and tone normal.  SKIN:  Intact.   IMPRESSION:  Knee pain, osteoarthritis, torn  lateral meniscus.   PLAN:  Arthroscopy, right knee, partial lateral meniscectomy.  Code  09323, 715.16. 719.46, 717.41.      Vickki Hearing, M.D.  Electronically Signed     SEH/MEDQ  D:  04/30/2007  T:  04/30/2007  Job:  [id number]   cc:   [facility]

## 2011-05-10 NOTE — Op Note (Signed)
NAME:  Nicholas Delacruz, Nicholas Delacruz              ACCOUNT NO.:  [id number]   MEDICAL RECORD NO.:  [id number]          PATIENT TYPE:  AMB   LOCATION:  DAY                           FACILITY:  [facility]   PHYSICIAN:  Vickki Hearing, M.D.DATE OF BIRTH:  01/27/1966   DATE OF PROCEDURE:  DATE OF DISCHARGE:                               OPERATIVE REPORT   PREOPERATIVE DIAGNOSIS:  Pain and arthritis with torn meniscus, left  knee.   POSTOPERATIVE DIAGNOSIS:  Medial meniscal tear, osteoarthritis, left  knee.   PROCEDURES:  Arthroscopy left knee, microfracture medial femoral  condyle, partial medial meniscectomy.   ANESTHETIC:  General.   OPERATIVE FINDINGS:  There was a grade 4 lesion of the medial femoral  condyle on the weightbearing surfaces, a parrot-beak type tear at the  medial meniscus with reflection of the meniscus under the tibial  condyle.  There was mild chondromalacia of the patella, lateral  compartment had a lateral tibial plateau flap lesion on the tibial side.  ACL and PCL were intact.   ASSISTANTS:  None.   DETAILS OF PROCEDURE:  The patient was identified in the preop area,  site marking was performed.  Antibiotics were started.  He was taken to  Surgery for general anesthetic.  Left leg was prepped with DuraPrep and  draped sterilely.  Time-out was completed.   Lateral portal was established.  Diagnostic arthroscopy was performed.  A diagnostic arthroscopy was then repeated with a probe in the joint  through the medial compartment and medial portal.  Intra-articular  structures were probed and then meniscal resection of tear was performed  medially with a duckbill forceps and motorized shaver.  Meniscal  fragments were removed.  The rim of the articular lesion was debrided to  a stable base and then microfracture was performed.   Knee was then irrigated with fluid, any remaining meniscal fragments  were removed and we injected approximately 30 mL of Marcaine with  epinephrine, closed the portals and then applied sterile dressings and  Cryo/Cuff, then the patient was extubated and taken to recovery room in  stable condition.      Vickki Hearing, M.D.  Electronically Signed    SEH/MEDQ  D:  07/02/2009  T:  07/02/2009  Job:  [id number]

## 2011-05-10 NOTE — Op Note (Signed)
NAME:  Nicholas Delacruz, Nicholas Delacruz              ACCOUNT NO.:  [id number]   MEDICAL RECORD NO.:  [id number]          PATIENT TYPE:  AMB   LOCATION:  DAY                           FACILITY:  [facility]   PHYSICIAN:  Vickki Hearing, M.D.DATE OF BIRTH:  February 13, 1966   DATE OF PROCEDURE:  05/01/2007  DATE OF DISCHARGE:                               OPERATIVE REPORT   PREOPERATIVE DIAGNOSIS:  Knee pain, arthritis, torn lateral meniscus.   POSTOPERATIVE DIAGNOSIS:  Arthritis right knee.   PROCEDURE:  Arthroscopy right knee, microfracture medial femoral  condyle, abrasion arthroplasty lateral tibial plateau, debridement of  notch, right knee.   ANESTHETIC:  General by intubation.   OPERATIVE FINDINGS:  The knee was severely arthritic.  I will start in  the lateral compartment.  There was grade 4 change of the lateral  compartment, primarily involving the weightbearing surface.  There was  an old partial resection of the medial meniscus which had fraying of the  free edge of the posterior horn.  There was grade 3 changes of the  tibial plateau.   The notch area had impinging osteophytes which impinged on the ACL and  narrowed the notch.  The ACL and PCL, however were intact.   The lateral compartment, the lateral meniscus had been resected back to  the popliteus hiatus with a small rim of tissue still remaining.  The  tibial plateau there was grade 4 change.  On the lateral femoral  condyle, there were grade 4 changes as well.  In the trochlear region  the trochlea had grade 3 to 4 changes.  The medial and lateral facets of  the patella fortunately seemed to be fairly well preserved.   There were no assistants.   DETAILS OF PROCEDURE:  This procedure was done in the following manner.  First in the preop area the patient was started on antibiotics.  His  right knee was marked for surgery and countersigned by me the surgeon.  His history and physical was updated at the same time.   He was taken to  surgery for general anesthesia and his right knee was  prepped and draped using DuraPrep.   The time-out procedure was completed, the procedure was confirmed on  Verle Cheyney's right knee for arthroscopy.   A lateral portal was established and diagnostic arthroscopy was  performed.  A repeat diagnostic arthroscopy was performed with the  insertion of a probe in the medial compartment.  We probed the intra-  articular structures and then began working on the medial compartment.   A motorized bur was used to perform abrasion arthroplasty of the medial  femoral condyle.  Collene Mares was used to stabilize the rim of the area of  bone which was exposed and then microfracture was performed with several  holes being placed in the area of exposed bone.   We then debrided the free edge of the medial meniscus which was torn,  cleaned up any loose cartilaginous tissue on the medial tibial plateau.   We then turned our attention to the lateral compartment where an  abrasion arthroplasty was done on the tibial  plateau.  A rasp was used  to smooth out the femoral and tibial condylar surfaces.   A motorized bur was then used to open up the notch of impinging  osteophytes.  We debrided this with the shaver and the series of rasps.  We then irrigated the knee, cleaned it, washed it out and then suctioned  it free of debris.   We injected a total of 60 mL of Marcaine with epinephrine into the  joint, placed Steri-Strips over the wound, sterile dressing with Ace  bandage and cryo cuff.   The patient will be nonweightbearing.  I gave him 90 Percocet for pain.  I would like to get him a CPM machine if his insurance will prove and I  expect a long course.   He will come to total knee replacement very soon.  Would like to hold  off as long as possible because he is only 45 years old; however, I do  not see how this as possible based on what I have seen in the surgical  procedure today.      Vickki Hearing, M.D.  Electronically Signed     SEH/MEDQ  D:  05/01/2007  T:  05/01/2007  Job:  [id number]

## 2011-05-11 ENCOUNTER — Ambulatory Visit (HOSPITAL_COMMUNITY)
Admission: RE | Admit: 2011-05-11 | Discharge: 2011-05-11 | Disposition: A | Discharge status: Home / Self Care | Payer: 59 | Source: Ambulatory Visit | Attending: Family Medicine | Admitting: Family Medicine

## 2011-05-13 ENCOUNTER — Ambulatory Visit (HOSPITAL_COMMUNITY)
Admission: RE | Admit: 2011-05-13 | Discharge: 2011-05-13 | Disposition: A | Discharge status: Home / Self Care | Payer: 59 | Source: Ambulatory Visit | Attending: Family Medicine | Admitting: Family Medicine

## 2011-05-13 NOTE — Op Note (Signed)
[facility]  Patient:    Nicholas Delacruz, Nicholas Delacruz Visit Number: [id number] MRN: [id number]          Service Type: DSU Location: DAY Attending Physician:  Lurene Shadow. Date: 09/13/01 Admit Date:  09/13/2001   CC:         Luciana Axe, M.D., [facility], [place], [place]   Operative Report  PREOPERATIVE DIAGNOSIS:  Urethral stricture disease.  POSTOPERATIVE DIAGNOSIS:  Urethral stricture disease.  OPERATION:  Cystourethroscopy and urethral dilation.  SURGEON:  Sigmund I. Patsi Sears, M.D.  ANESTHESIA:  General endotracheal.  PREPARATION:  After appropriate preanesthesia, the patient was brought to the operating room, placed on the operating table in the dorsal supine position where general endotracheal anesthesia was introduced.  He was re-placed in the dorsal lithotomy position where the pubis was prepped with Betadine solution and draped in the usual fashion.  REVIEW OF HISTORY:  This 45 year old male has a history of urethral stricture disease, dilated as a child, now with severe urinary frequency and urgency, and flexible cystoscopy in the office showing recurrent pendulous urethral stricture disease.  He is now for cystoscopic dilation.  DESCRIPTION OF PROCEDURE:  The rigid 12 degree scope was placed into the pendulous urethra, and the stricture was identified.  Under anesthesia, the stricture was able to be dilated, and the scope passed into the bladder. Photodocumentation of this strictured area was identified.  The stricture appeared to be longer that at first thought and more proximal.  A concentric, whitish area was identified just distal to the verumontanum.  Following repeat dilation to a size 28 [place], cystoscopy was accomplished, and the scope moved easily in and out of the urethra.  Minimal bleeding was noted there, and there was trabeculation of the bladder but no evidence of any further  obstructive phenomenon.  There was no stone, no tumor.  The patient was then awakened after Xylocaine jelly was placed in the urethra. He will be given Toradol IV.  He was taken to the recovery room in good condition. Attending Physician:  Laqueta Jean DD:  09/13/01 TD:  09/13/01 Job: [id number]

## 2011-05-16 ENCOUNTER — Ambulatory Visit (HOSPITAL_COMMUNITY)
Admission: RE | Admit: 2011-05-16 | Discharge: 2011-05-16 | Disposition: A | Discharge status: Home / Self Care | Payer: 59 | Source: Ambulatory Visit | Attending: Family Medicine | Admitting: Family Medicine

## 2011-05-16 ENCOUNTER — Other Ambulatory Visit: Payer: Self-pay | Admitting: *Deleted

## 2011-05-16 DIAGNOSIS — R52 Pain, unspecified: Secondary | ICD-10-CM

## 2011-05-16 MED ORDER — HYDROCODONE-ACETAMINOPHEN 7.5-325 MG PO TABS: 1.0000 | ORAL_TABLET | ORAL | Status: DC | PRN

## 2011-05-17 ENCOUNTER — Encounter: Payer: Self-pay | Admitting: Orthopedic Surgery

## 2011-05-17 ENCOUNTER — Ambulatory Visit (INDEPENDENT_AMBULATORY_CARE_PROVIDER_SITE_OTHER): Payer: 59 | Admitting: Orthopedic Surgery

## 2011-05-17 DIAGNOSIS — M75100 Unspecified rotator cuff tear or rupture of unspecified shoulder, not specified as traumatic: Secondary | ICD-10-CM

## 2011-05-17 DIAGNOSIS — S43429A Sprain of unspecified rotator cuff capsule, initial encounter: Secondary | ICD-10-CM

## 2011-05-17 NOTE — Patient Instructions (Signed)
OOW x 1 month

## 2011-05-17 NOTE — Progress Notes (Signed)
Postoperative visit approximate 8 weeks status post mini open cuff repair with arthroscopy LEFT shoulder  Date of surgery March 23  In the postop period had an episode of pain seemed to resolve with rest  Now in therapy doing well progressing well  Some crepitance in the subacromial space  Continued therapy followup in one month out of work one month

## 2011-05-18 ENCOUNTER — Ambulatory Visit (HOSPITAL_COMMUNITY): Payer: 59 | Admitting: Specialist

## 2011-05-20 ENCOUNTER — Ambulatory Visit (HOSPITAL_COMMUNITY): Payer: 59 | Admitting: Specialist

## 2011-05-24 ENCOUNTER — Ambulatory Visit (HOSPITAL_COMMUNITY): Payer: 59

## 2011-05-26 ENCOUNTER — Ambulatory Visit (HOSPITAL_COMMUNITY)
Admission: RE | Admit: 2011-05-26 | Discharge: 2011-05-26 | Disposition: A | Discharge status: Home / Self Care | Payer: 59 | Source: Ambulatory Visit | Attending: Family Medicine | Admitting: Family Medicine

## 2011-05-30 ENCOUNTER — Ambulatory Visit (HOSPITAL_COMMUNITY)
Admission: RE | Admit: 2011-05-30 | Discharge: 2011-05-30 | Disposition: A | Discharge status: Home / Self Care | Payer: 59 | Source: Ambulatory Visit | Attending: Orthopedic Surgery | Admitting: Orthopedic Surgery

## 2011-05-30 DIAGNOSIS — M25619 Stiffness of unspecified shoulder, not elsewhere classified: Secondary | ICD-10-CM | POA: Insufficient documentation

## 2011-05-30 DIAGNOSIS — IMO0001 Reserved for inherently not codable concepts without codable children: Secondary | ICD-10-CM | POA: Insufficient documentation

## 2011-05-30 DIAGNOSIS — I10 Essential (primary) hypertension: Secondary | ICD-10-CM | POA: Insufficient documentation

## 2011-05-30 DIAGNOSIS — M25519 Pain in unspecified shoulder: Secondary | ICD-10-CM | POA: Insufficient documentation

## 2011-05-30 DIAGNOSIS — M6281 Muscle weakness (generalized): Secondary | ICD-10-CM | POA: Insufficient documentation

## 2011-06-01 ENCOUNTER — Ambulatory Visit (HOSPITAL_COMMUNITY)
Admission: RE | Admit: 2011-06-01 | Discharge: 2011-06-01 | Disposition: A | Discharge status: Home / Self Care | Payer: 59 | Source: Ambulatory Visit | Attending: Family Medicine | Admitting: Family Medicine

## 2011-06-03 ENCOUNTER — Ambulatory Visit (HOSPITAL_COMMUNITY): Payer: 59 | Admitting: Specialist

## 2011-06-06 ENCOUNTER — Ambulatory Visit (HOSPITAL_COMMUNITY): Payer: 59 | Admitting: Specialist

## 2011-06-08 ENCOUNTER — Ambulatory Visit (HOSPITAL_COMMUNITY): Payer: 59 | Admitting: Specialist

## 2011-06-09 ENCOUNTER — Ambulatory Visit (HOSPITAL_COMMUNITY)
Admission: RE | Admit: 2011-06-09 | Discharge: 2011-06-09 | Disposition: A | Discharge status: Home / Self Care | Payer: 59 | Source: Ambulatory Visit | Attending: Family Medicine | Admitting: Family Medicine

## 2011-06-14 ENCOUNTER — Ambulatory Visit (HOSPITAL_COMMUNITY)
Admission: RE | Admit: 2011-06-14 | Discharge: 2011-06-14 | Disposition: A | Discharge status: Home / Self Care | Payer: 59 | Source: Ambulatory Visit | Attending: Family Medicine | Admitting: Family Medicine

## 2011-06-15 ENCOUNTER — Ambulatory Visit (HOSPITAL_COMMUNITY)
Admission: RE | Admit: 2011-06-15 | Discharge: 2011-06-15 | Disposition: A | Discharge status: Home / Self Care | Payer: 59 | Source: Ambulatory Visit | Attending: Family Medicine | Admitting: Family Medicine

## 2011-06-17 ENCOUNTER — Ambulatory Visit (HOSPITAL_COMMUNITY)
Admission: RE | Admit: 2011-06-17 | Discharge: 2011-06-17 | Disposition: A | Discharge status: Home / Self Care | Payer: 59 | Source: Ambulatory Visit | Attending: Family Medicine | Admitting: Family Medicine

## 2011-06-20 ENCOUNTER — Other Ambulatory Visit: Payer: Self-pay | Admitting: *Deleted

## 2011-06-20 ENCOUNTER — Ambulatory Visit (HOSPITAL_COMMUNITY): Payer: 59 | Admitting: Occupational Therapy

## 2011-06-20 DIAGNOSIS — R52 Pain, unspecified: Secondary | ICD-10-CM

## 2011-06-20 MED ORDER — HYDROCODONE-ACETAMINOPHEN 7.5-325 MG PO TABS: 1.0000 | ORAL_TABLET | ORAL | Status: DC | PRN

## 2011-06-21 ENCOUNTER — Encounter: Payer: Self-pay | Admitting: Orthopedic Surgery

## 2011-06-21 ENCOUNTER — Ambulatory Visit (INDEPENDENT_AMBULATORY_CARE_PROVIDER_SITE_OTHER): Payer: 59 | Admitting: Orthopedic Surgery

## 2011-06-21 ENCOUNTER — Ambulatory Visit: Payer: 59 | Admitting: Orthopedic Surgery

## 2011-06-21 DIAGNOSIS — Z9889 Other specified postprocedural states: Secondary | ICD-10-CM

## 2011-06-21 DIAGNOSIS — M75120 Complete rotator cuff tear or rupture of unspecified shoulder, not specified as traumatic: Secondary | ICD-10-CM

## 2011-06-21 NOTE — Progress Notes (Signed)
Rotator cuff surgery LEFT shoulder  Postoperative visit approximate 8 weeks status post mini open cuff repair with arthroscopy LEFT shoulder  Date of surgery March 23  Doing well  He does have some crepitance in the shoulder with his range of motion.  He elevated the arm 150 and forward elevation.  His therapy notes indicate he still has some weakness  I gave him a subacromial injection  Continue therapy followup in one month  Return to work August 27 estimated

## 2011-06-21 NOTE — Patient Instructions (Signed)
Continue therapy  Come back in a month  August 27th return to work

## 2011-06-22 ENCOUNTER — Ambulatory Visit (HOSPITAL_COMMUNITY): Payer: 59 | Admitting: Occupational Therapy

## 2011-06-24 ENCOUNTER — Ambulatory Visit (HOSPITAL_COMMUNITY): Payer: 59 | Admitting: Occupational Therapy

## 2011-06-27 ENCOUNTER — Ambulatory Visit (HOSPITAL_COMMUNITY)
Admission: RE | Admit: 2011-06-27 | Discharge: 2011-06-27 | Disposition: A | Discharge status: Home / Self Care | Payer: 59 | Source: Ambulatory Visit | Attending: Orthopedic Surgery | Admitting: Orthopedic Surgery

## 2011-06-27 DIAGNOSIS — I10 Essential (primary) hypertension: Secondary | ICD-10-CM | POA: Insufficient documentation

## 2011-06-27 DIAGNOSIS — M25519 Pain in unspecified shoulder: Secondary | ICD-10-CM | POA: Insufficient documentation

## 2011-06-27 DIAGNOSIS — M6281 Muscle weakness (generalized): Secondary | ICD-10-CM | POA: Insufficient documentation

## 2011-06-27 DIAGNOSIS — M25619 Stiffness of unspecified shoulder, not elsewhere classified: Secondary | ICD-10-CM | POA: Insufficient documentation

## 2011-06-27 DIAGNOSIS — IMO0001 Reserved for inherently not codable concepts without codable children: Secondary | ICD-10-CM | POA: Insufficient documentation

## 2011-06-28 ENCOUNTER — Ambulatory Visit (HOSPITAL_COMMUNITY)
Admission: RE | Admit: 2011-06-28 | Discharge: 2011-06-28 | Disposition: A | Discharge status: Home / Self Care | Payer: 59 | Source: Ambulatory Visit | Attending: Family Medicine | Admitting: Family Medicine

## 2011-06-30 ENCOUNTER — Ambulatory Visit (HOSPITAL_COMMUNITY)
Admission: RE | Admit: 2011-06-30 | Discharge: 2011-06-30 | Disposition: A | Discharge status: Home / Self Care | Payer: 59 | Source: Ambulatory Visit | Attending: Family Medicine | Admitting: Family Medicine

## 2011-07-04 ENCOUNTER — Ambulatory Visit (HOSPITAL_COMMUNITY)
Admission: RE | Admit: 2011-07-04 | Discharge: 2011-07-04 | Disposition: A | Discharge status: Home / Self Care | Payer: 59 | Source: Ambulatory Visit | Attending: Family Medicine | Admitting: Family Medicine

## 2011-07-04 NOTE — Progress Notes (Signed)
Occupational Therapy Treatment  Patient Name: Nicholas Delacruz MRN: [id number] Today's Date: 07/04/2011  HPI: Pain Assessment Currently in Pain?: Yes Pain Score:   4 Pain Location: Shoulder Pain Orientation: Left Pain Type: Acute pain;Other (Comment) (feels like grinding.) Pain Onset: Yesterday Pain Frequency: Other (Comment) (with movement)  Precautions/Restrictions    Mobility       Exercise/Treatments @FLOW [phone]  Goals Long Term Goals Long Term Goal 1: Decreased pain in his left shoulder to 1/10 while reaching overhead at work. Long Term Goal 1 Progress: Progressing toward goal Long Term Goal 2: Increase left shoulder AROM to WNL for increased independence reaching overhead. Long Term Goal 2 Progress: Met Long Term Goal 3: Return to prior level of independence with all daily, work and leisure activities. Long Term Goal 3 Progress: Met Long Term Goal 4: Decrease fascial restrictions to trace in his left shoulder. Long Term Goal 4 Progress: Met Long Term Goal 5: Increase left shoulder strength to 5/5 for increased independence with lifting pumps at work. Long Term Goal 5 Progress: Progressing toward goal End of Session Patient Active Problem List  Diagnoses  . KNEE, ARTHRITIS, DEGEN./OSTEO  . JOINT EFFUSION, LEFT KNEE  . KNEE PAIN  . TEAR MEDIAL MENISCUS  . CONTUSION, LEFT KNEE  . HIGH BLOOD PRESSURE  . SHOULDER PAIN  . IMPINGEMENT SYNDROME  . SUPERIOR GLENOID LABRUM TEAR  . RHEUMATOID ARTHRITIS  . RUPTURE ROTATOR CUFF   End of Session Activity Tolerance: Patient tolerated treatment well General Behavior During Session: [facility] for  tasks performed Cognition: [facility] for tasks performed OT Assessment and Plan Clinical Impression Statement: pain decreased to 2/10 after manuel.  Reassess Prognosis: Good OT Frequency: Min 2X/week OT Duration: 2 weeks (12 weeks) OT Treatment/Interventions: Therapeutic exercise;Manual therapy OT Plan: reassess   Vaughan Browner 07/04/2011, 4:17 PM

## 2011-07-06 ENCOUNTER — Telehealth (HOSPITAL_COMMUNITY): Payer: Self-pay | Admitting: Occupational Therapy

## 2011-07-06 ENCOUNTER — Inpatient Hospital Stay (HOSPITAL_COMMUNITY): Admission: RE | Admit: 2011-07-06 | Payer: 59 | Source: Ambulatory Visit | Admitting: Occupational Therapy

## 2011-07-08 ENCOUNTER — Ambulatory Visit (HOSPITAL_COMMUNITY): Payer: 59 | Admitting: Occupational Therapy

## 2011-07-12 ENCOUNTER — Ambulatory Visit (HOSPITAL_COMMUNITY)
Admission: RE | Admit: 2011-07-12 | Discharge: 2011-07-12 | Disposition: A | Discharge status: Home / Self Care | Payer: 59 | Source: Ambulatory Visit | Attending: Family Medicine | Admitting: Family Medicine

## 2011-07-12 NOTE — Progress Notes (Signed)
Occupational Therapy Treatment  Patient Name: Nicholas Delacruz MRN: [id number] Today's Date: 07/12/2011 Time In 1038 Time Out 1125  Manual Therapy [phone]  Therapeutic Exercise 325-348  Visit 27/36  Reassessment date: 07/14/11   HPI: Symptoms/Limitations Symptoms: S:  I just have some popping and grinding. Limitations: decreased strength Pain Assessment Currently in Pain?: No/denies  Precautions/Restrictions    Mobility       Exercise/Treatments Cervical Exercises Shoulder Flexion: PROM;Strengthening;Left;15 reps;Prone;Seated;Other (comment) (5 pound seated and prone) Shoulder ABduction: PROM;Strengthening;Left;15 reps;Seated Shoulder Horizontal ABduction:  (5 pounds seated and prone) Shoulder Internal Rotation: PROM;Strengthening;Left;15 reps;Prone;Seated;Power tower;Other (comment) (5 pounds seated and prone) Shoulder External Rotation: PROM;Strengthening;Left;15 reps;Prone;Seated;Power tower (5 pounds seated and prone) UBE (Upper Arm Bike):  (3 min and 3 min 5.5) Shoulder Exercises Shoulder Flexion: PROM;Strengthening;Left;15 reps;Prone;Seated;Other (comment) (5 pound seated and prone) Shoulder Protraction: PROM;10 reps;Supine;Strengthening;Seated (and prone 5 pounds 10 reps each) Shoulder Horizontal ABduction:  (5 pounds seated and prone) Shoulder ABduction: PROM;Strengthening;Left;15 reps;Seated Shoulder External Rotation: PROM;Strengthening;Left;15 reps;Prone;Seated;Power tower (5 pounds seated and prone) Shoulder Internal Rotation: PROM;Strengthening;Left;15 reps;Prone;Seated;Power tower;Other (comment) (5 pounds seated and prone) Additional ROM/Strengthening Exercises UBE (Upper Arm Bike):  (3 min and 3 min 5.5) Cybex Press: 6 plates x 20 reps Cybex Row: 6 plates x 20 reps Wall Wash: 4 minutes with 4 pounds Proximal Shoulder Strengthening - Seated:  (5 pounds x 10 reps) Additional Elbow Exercises UBE (Upper Arm Bike):  (3 min and 3 min 5.5) Cybex Press: 6 plates  x 20 reps Cybex Row: 6 plates x 20 reps Neurological Re-education Exercises Shoulder Flexion: PROM;Strengthening;Left;15 reps;Prone;Seated;Other (comment) (5 pound seated and prone) Shoulder ABduction: PROM;Strengthening;Left;15 reps;Seated Shoulder Protraction: PROM;10 reps;Supine;Strengthening;Seated (and prone 5 pounds 10 reps each) Shoulder Horizontal ABduction:  (5 pounds seated and prone) Shoulder External Rotation: PROM;Strengthening;Left;15 reps;Prone;Seated;Power tower (5 pounds seated and prone) Shoulder Internal Rotation: PROM;Strengthening;Left;15 reps;Prone;Seated;Power tower;Other (comment) (5 pounds seated and prone) Work Hardening Exercises UBE (Upper Arm Bike):  (3 min and 3 min 5.5) Manual Therapy Manual Therapy: Myofascial release Myofascial Release: MFR and manual stretching to left shoulder and scapular region to decrease pain and increase mobility.    Exercises completed one time only. Goals Long Term Goals Long Term Goal 1 Progress: Progressing toward goal Long Term Goal 2 Progress: Progressing toward goal Long Term Goal 3 Progress: Progressing toward goal Long Term Goal 4 Progress: Progressing toward goal Long Term Goal 5 Progress: Progressing toward goal End of Session Patient Active Problem List  Diagnoses  . KNEE, ARTHRITIS, DEGEN./OSTEO  . JOINT EFFUSION, LEFT KNEE  . KNEE PAIN  . TEAR MEDIAL MENISCUS  . CONTUSION, LEFT KNEE  . HIGH BLOOD PRESSURE  . SHOULDER PAIN  . IMPINGEMENT SYNDROME  . SUPERIOR GLENOID LABRUM TEAR  . RHEUMATOID ARTHRITIS  . RUPTURE ROTATOR CUFF   End of Session Activity Tolerance: Patient tolerated treatment well General Behavior During Session: [id number] [facility] for tasks performed Cognition: [facility] for tasks performed OT Assessment and Plan Clinical Impression Statement: A:  held power tower exercises this date secondary to time constraints. Prognosis: Good OT Plan: P:  Reassess on 07/14/11.  Resume power tower exercises and add work  hardening exercises.   Sondra Barges [place] 07/12/2011, 4:50 PM

## 2011-07-14 ENCOUNTER — Ambulatory Visit (HOSPITAL_COMMUNITY)
Admission: RE | Admit: 2011-07-14 | Discharge: 2011-07-14 | Disposition: A | Discharge status: Home / Self Care | Payer: 59 | Source: Ambulatory Visit | Attending: Family Medicine | Admitting: Family Medicine

## 2011-07-14 NOTE — Progress Notes (Signed)
Occupational Therapy Treatment  Patient Name: Nicholas Delacruz MRN: [id number] Today's Date: 07/14/2011 Time In 1139 Time Out 1215  Manual Therapy [id number] Therapeutic Exercise 4782-9562  Visit 28/36  Reassessment date: 08/11/11  HPI: Symptoms/Limitations Symptoms: "Ive been feeling alot of pain in my shoulder since my last visit.  I dont know if I am doing enough." Pain Assessment Currently in Pain?: Yes Pain Score:   5 Pain Location: Shoulder Pain Orientation: Left Pain Type: Acute pain  Precautions/Restrictions    Mobility       Exercise/Treatments Cervical Exercises UBE (Upper Arm Bike): 3 min and 3 min x 6.0 Additional ROM/Strengthening Exercises UBE (Upper Arm Bike): 3 min and 3 min x 6.0 Cybex Press: 6 1/2 plates x 15 reps 3 sets Cybex Row: 6 1/2 plates x 15 reps x 3 sets Additional Elbow Exercises UBE (Upper Arm Bike): 3 min and 3 min x 6.0 Cybex Press: 6 1/2 plates x 15 reps 3 sets Cybex Row: 6 1/2 plates x 15 reps x 3 sets Work Hardening Exercises UBE (Upper Arm Bike): 3 min and 3 min x 6.0 Manual Therapy Manual Therapy: Myofascial release Myofascial Release: MFR and manual stretching to left shoulder and scapular region to decrease pain and increase AROM.  Exercises done one time only.  Goals Long Term Goals Long Term Goal 1 Progress: Progressing toward goal Long Term Goal 2 Progress: Met Long Term Goal 4 Progress: Met Long Term Goal 5 Progress: Met End of Session Patient Active Problem List  Diagnoses  . KNEE, ARTHRITIS, DEGEN./OSTEO  . JOINT EFFUSION, LEFT KNEE  . KNEE PAIN  . TEAR MEDIAL MENISCUS  . CONTUSION, LEFT KNEE  . HIGH BLOOD PRESSURE  . SHOULDER PAIN  . IMPINGEMENT SYNDROME  . SUPERIOR GLENOID LABRUM TEAR  . RHEUMATOID ARTHRITIS  . RUPTURE ROTATOR CUFF   End of Session Activity Tolerance: Patient tolerated treatment well General Behavior During Session: [facility] for tasks performed Cognition: [facility] for tasks performed OT Assessment  and Plan Clinical Impression Statement: A: Seated shoulder flexion 175 5/5, abduction 175 5/5, external rotation 80 5/5, internal rotation 84 5/5.  Omitted most ther ex secondary to patient in increased pain . OT Plan: P:  DC manual therapy.  Continue cybex press/row cybex overhead pulldown, lat pull, and power tower, UBE.  DC other exercises.  Will have 2 visits next week then to MD (note already sent) for propable DC.   Sondra Barges [place] 07/14/2011, 12:20 PM

## 2011-07-18 ENCOUNTER — Ambulatory Visit (HOSPITAL_COMMUNITY)
Admission: RE | Admit: 2011-07-18 | Discharge: 2011-07-18 | Disposition: A | Discharge status: Home / Self Care | Payer: 59 | Source: Ambulatory Visit | Attending: Family Medicine | Admitting: Family Medicine

## 2011-07-18 NOTE — Progress Notes (Signed)
Occupational Therapy Treatment  Patient Name: Nicholas Delacruz MRN: [id number] Today's Date: 07/18/2011   OT Time in:   4:35       Time out:    5:17  Manual Therapy: d/c Visit 29/36  Reassessment date: 08/11/11        HPI: Symptoms/Limitations Symptoms: It is burning a little in the front and hurting in the back.  I racked some mulch. Pain Assessment Currently in Pain?: Yes Pain Score:   4 Pain Location: Shoulder Pain Orientation: Posterior;Anterior Pain Type: Chronic pain Pain Onset: More than a month ago Pain Frequency: Constant  Precautions/Restrictions    Mobility       Exercise/Treatments Cervical Exercises Shoulder Extension: Strengthening;Power Tower;15 reps Shoulder Retraction: Strengthening;Power Tower Row: Strengthening;Power tower;15 reps Shoulder Internal Rotation: Strengthening;Power tower;15 reps;Left Shoulder External Rotation: Strengthening;Power tower;15 reps;Left UBE (Upper Arm Bike): 3 min and 3 min x 6.0 Shoulder Exercises Shoulder Extension: Strengthening;Power Tower;15 reps Shoulder Retraction: Strengthening;Power Tower Shoulder External Rotation: Strengthening;Power tower;15 reps;Left Shoulder Internal Rotation: Strengthening;Power tower;15 reps;Left Row: Strengthening;Power tower;15 reps Additional ROM/Strengthening Exercises UBE (Upper Arm Bike): 3 min and 3 min x 6.0 Cybex Press: 5 plates x [id number] 3 sets Cybex Row: 5 plates x 20 reps x 3 sets (decrese weight secondary to pain) Wall Wash: d/c Proximal Shoulder Strengthening - Seated: d/c Additional Elbow Exercises UBE (Upper Arm Bike): 3 min and 3 min x 6.0 Cybex Press: 5 plates x [id number] 3 sets Cybex Row: 5 plates x 20 reps x 3 sets (decrese weight secondary to pain) Neurological Re-education Exercises Shoulder External Rotation: Strengthening;Power tower;15 reps;Left Shoulder Internal Rotation: Strengthening;Power tower;15 reps;Left Work Hardening Exercises UBE (Upper Arm Bike): 3  min and 3 min x 6.0     Goals Long Term Goals Long Term Goal 1 Progress: Progressing toward goal Long Term Goal 3 Progress: Progressing toward goal End of Session Patient Active Problem List  Diagnoses  . KNEE, ARTHRITIS, DEGEN./OSTEO  . JOINT EFFUSION, LEFT KNEE  . KNEE PAIN  . TEAR MEDIAL MENISCUS  . CONTUSION, LEFT KNEE  . HIGH BLOOD PRESSURE  . SHOULDER PAIN  . IMPINGEMENT SYNDROME  . SUPERIOR GLENOID LABRUM TEAR  . RHEUMATOID ARTHRITIS  . RUPTURE ROTATOR CUFF   End of Session Activity Tolerance: Patient limited by pain OT Assessment and Plan Clinical Impression Statement: d/c's supine and seated with weights, patient with increased pain and fatigue tremors with ex today, may be related to yard work done prior to therapy. Encouraged patient to ice at home. OT Plan: cont with plan, add lat pull down.   Noralee Stain, Sanaia Jasso L 07/18/2011, 5:17 PM

## 2011-07-19 ENCOUNTER — Other Ambulatory Visit: Payer: Self-pay | Admitting: *Deleted

## 2011-07-19 DIAGNOSIS — R52 Pain, unspecified: Secondary | ICD-10-CM

## 2011-07-19 MED ORDER — HYDROCODONE-ACETAMINOPHEN 7.5-325 MG PO TABS: 1.0000 | ORAL_TABLET | ORAL | Status: DC | PRN

## 2011-07-20 ENCOUNTER — Ambulatory Visit (HOSPITAL_COMMUNITY)
Admission: RE | Admit: 2011-07-20 | Discharge: 2011-07-20 | Disposition: A | Discharge status: Home / Self Care | Payer: 59 | Source: Ambulatory Visit | Attending: Family Medicine | Admitting: Family Medicine

## 2011-07-20 NOTE — Progress Notes (Addendum)
Occupational Therapy Treatment  Patient Name: Nicholas Delacruz MRN: [id number] Today's Date: 07/20/2011  Time In 4:35 Time Out 5:16  Visit 30/36  Reassessment date: 08/11/11   HPI: Symptoms/Limitations Symptoms: It really starts hurting at night. Pain Assessment Currently in Pain?: Yes Pain Score:   2 Pain Location: Arm Pain Orientation: Left;Anterior Pain Onset: More than a month ago Pain Frequency: Intermittent         Exercise/Treatments Cervical Exercises Shoulder Extension: Strengthening;Power Tower;15 reps (level 11) Shoulder Retraction: Strengthening;Power Tower (level 11) Row: Strengthening;Power tower;15 reps (level 11) Shoulder Internal Rotation: Strengthening;Power tower;15 reps;Left (level 6) Shoulder External Rotation: Strengthening;Power tower;15 reps;Left (level 6) UBE (Upper Arm Bike): 3 min and 3 min x 6.5 Shoulder Exercises Shoulder Extension: Strengthening;Power Tower;15 reps (level 11) Shoulder Retraction: Strengthening;Power Tower (level 11) Shoulder External Rotation: Strengthening;Power tower;15 reps;Left (level 6) Shoulder Internal Rotation: Strengthening;Power tower;15 reps;Left (level 6) Row: Strengthening;Power tower;15 reps (level 11) Additional ROM/Strengthening Exercises UBE (Upper Arm Bike): 3 min and 3 min x 6.5 Cybex Press: 5 plates x [id number] 3 sets Cybex Row: 5 plates x 20 reps x 3 sets Additional Elbow Exercises UBE (Upper Arm Bike): 3 min and 3 min x 6.5 Cybex Press: 5 plates x [id number] 3 sets Cybex Row: 5 plates x 20 reps x 3 sets Neurological Re-education Exercises Shoulder External Rotation: Strengthening;Power tower;15 reps;Left (level 6) Shoulder Internal Rotation: Strengthening;Power tower;15 reps;Left (level 6) Work Hardening Exercises UBE (Upper Arm Bike): 3 min and 3 min x 6.5   Cable Column:  Lat. Pull down 2x20 9 plates  Goals Long Term Goals Long Term Goal 1 Progress: Progressing toward goal Long Term Goal 3  Progress: Progressing toward goal End of Session Patient Active Problem List  Diagnoses  . KNEE, ARTHRITIS, DEGEN./OSTEO  . JOINT EFFUSION, LEFT KNEE  . KNEE PAIN  . TEAR MEDIAL MENISCUS  . CONTUSION, LEFT KNEE  . HIGH BLOOD PRESSURE  . SHOULDER PAIN  . IMPINGEMENT SYNDROME  . SUPERIOR GLENOID LABRUM TEAR  . RHEUMATOID ARTHRITIS  . RUPTURE ROTATOR CUFF       Vaughan Browner 07/20/2011, 5:17 PM

## 2011-07-21 ENCOUNTER — Ambulatory Visit (INDEPENDENT_AMBULATORY_CARE_PROVIDER_SITE_OTHER): Payer: 59 | Admitting: Orthopedic Surgery

## 2011-07-21 ENCOUNTER — Encounter: Payer: Self-pay | Admitting: Orthopedic Surgery

## 2011-07-21 DIAGNOSIS — Z9889 Other specified postprocedural states: Secondary | ICD-10-CM

## 2011-07-21 DIAGNOSIS — M25519 Pain in unspecified shoulder: Secondary | ICD-10-CM

## 2011-07-21 MED ORDER — IBUPROFEN 800 MG PO TABS: 800.0000 mg | ORAL_TABLET | Freq: Three times a day (TID) | ORAL | Status: DC | PRN

## 2011-07-21 NOTE — Progress Notes (Signed)
Postoperative visit approximate status post Arthroscopy LEFT shoulder with mini open rotator cuff repair  Date of surgery March 23  Now 4 months from surgery  Physical therapy [facility]  I gave him an injection last visit her shoulder hurt for 8 days and got better.  Over the last 10 days been having pain; more at night; relieved by ice.  He has some pain at the posterior joint line seems to be in the infraspinatus.  Otherwise he has full range of motion has good manual muscle testing strength of the supraspinatus has excellent external rotation at his side and basically his motion has returned to normal he does have some subacromial pain and posterior joint line pain  Recommend decrease activity and therapy the only include Thera-Band's  Followup one month  Out of work one month

## 2011-07-22 ENCOUNTER — Ambulatory Visit (HOSPITAL_COMMUNITY): Payer: 59 | Admitting: Occupational Therapy

## 2011-08-16 ENCOUNTER — Ambulatory Visit (HOSPITAL_COMMUNITY)
Admission: RE | Admit: 2011-08-16 | Discharge: 2011-08-16 | Disposition: A | Discharge status: Home / Self Care | Payer: 59 | Source: Ambulatory Visit | Attending: Family Medicine | Admitting: Family Medicine

## 2011-08-16 ENCOUNTER — Other Ambulatory Visit (HOSPITAL_COMMUNITY): Payer: Self-pay | Admitting: Family Medicine

## 2011-08-16 DIAGNOSIS — M25579 Pain in unspecified ankle and joints of unspecified foot: Secondary | ICD-10-CM

## 2011-08-16 DIAGNOSIS — S8990XA Unspecified injury of unspecified lower leg, initial encounter: Secondary | ICD-10-CM | POA: Insufficient documentation

## 2011-08-16 DIAGNOSIS — S99929A Unspecified injury of unspecified foot, initial encounter: Secondary | ICD-10-CM | POA: Insufficient documentation

## 2011-08-16 DIAGNOSIS — X58XXXA Exposure to other specified factors, initial encounter: Secondary | ICD-10-CM | POA: Insufficient documentation

## 2011-08-16 DIAGNOSIS — S99919A Unspecified injury of unspecified ankle, initial encounter: Secondary | ICD-10-CM | POA: Insufficient documentation

## 2011-08-23 ENCOUNTER — Ambulatory Visit (INDEPENDENT_AMBULATORY_CARE_PROVIDER_SITE_OTHER): Payer: 59 | Admitting: Orthopedic Surgery

## 2011-08-23 ENCOUNTER — Encounter: Payer: Self-pay | Admitting: Orthopedic Surgery

## 2011-08-23 DIAGNOSIS — M75120 Complete rotator cuff tear or rupture of unspecified shoulder, not specified as traumatic: Secondary | ICD-10-CM

## 2011-08-23 NOTE — Progress Notes (Signed)
Postoperative visit approximate status post Arthroscopy LEFT shoulder with mini open rotator cuff repair  Date of surgery March 23   Shoulder improved   Left ankle/foot injury x-rays negative   The shoulder range of motion is good strength is good he has some numbness and tingling after I palpated his neck in the supraspinatus fossa  His ankle is tender in the lateral gutter and in the sinus tarsi region.  In terms of his ankle does not appear to be a major problem  He is just about ready to go back to work as far as his shoulder goes.  RTW Sept 17 6 hrs / day

## 2011-08-23 NOTE — Patient Instructions (Signed)
Return to work the 17th 6 hrs a day x 2 weeks then resume normal activity Wear brace on foot x 2 weeks

## 2011-08-26 ENCOUNTER — Other Ambulatory Visit: Payer: Self-pay | Admitting: *Deleted

## 2011-08-26 MED ORDER — HYDROCODONE-ACETAMINOPHEN 5-325 MG PO TABS: 1.0000 | ORAL_TABLET | ORAL | Status: AC | PRN

## 2011-09-12 ENCOUNTER — Telehealth: Payer: Self-pay | Admitting: Orthopedic Surgery

## 2011-09-12 NOTE — Telephone Encounter (Signed)
Nicholas Delacruz asked for a revised return to work note stating he may return 09/13/11 for 6 hours a day for 2 weeks, then full duty

## 2011-09-12 NOTE — Telephone Encounter (Signed)
Ok

## 2011-09-13 ENCOUNTER — Encounter: Payer: Self-pay | Admitting: Orthopedic Surgery

## 2011-09-13 NOTE — Telephone Encounter (Signed)
Note completed and faxed to [employer] at [phone]. Patient is aware

## 2011-09-27 ENCOUNTER — Telehealth: Payer: Self-pay | Admitting: Orthopedic Surgery

## 2011-09-27 NOTE — Telephone Encounter (Signed)
Nicholas Delacruz is coming in for a recheck of his shoulder 10/05/11.  He wants to know if you will extend his note to work only 6 hours a day Until after he is seen on 10/05/11. Please advise.

## 2011-09-28 ENCOUNTER — Encounter: Payer: Self-pay | Admitting: Orthopedic Surgery

## 2011-09-28 NOTE — Telephone Encounter (Signed)
Advised the patient, will fax the note to [employer]  Fax# [phone]

## 2011-09-28 NOTE — Telephone Encounter (Signed)
Sure

## 2011-10-05 ENCOUNTER — Encounter: Payer: Self-pay | Admitting: Orthopedic Surgery

## 2011-10-05 ENCOUNTER — Ambulatory Visit (INDEPENDENT_AMBULATORY_CARE_PROVIDER_SITE_OTHER): Payer: 59 | Admitting: Orthopedic Surgery

## 2011-10-05 VITALS — Ht 72.0 in | Wt 265.0 lb

## 2011-10-05 DIAGNOSIS — M25519 Pain in unspecified shoulder: Secondary | ICD-10-CM

## 2011-10-05 MED ORDER — HYDROCODONE-ACETAMINOPHEN 5-325 MG PO TABS: 1.0000 | ORAL_TABLET | ORAL | Status: DC | PRN

## 2011-10-05 MED ORDER — IBUPROFEN 800 MG PO TABS: 800.0000 mg | ORAL_TABLET | Freq: Three times a day (TID) | ORAL | Status: DC | PRN

## 2011-10-05 NOTE — Patient Instructions (Signed)
You have been scheduled for I nerve test of her LEFT upper extremity to evaluate the numbness tingling and burning.  youhave the option of coming back to discuss your results or receiving them by telephone.

## 2011-10-05 NOTE — Progress Notes (Signed)
Followup visit  March 2012 rotator cuff repair LEFT shoulder  Complains of numbness and tingling in the suprascapular region and across the shoulder  Full passive range of motion of the shoulder.  Rotator cuff is intact.  Recommend nerve conduction study.

## 2011-10-14 ENCOUNTER — Telehealth: Payer: Self-pay | Admitting: Radiology

## 2011-10-14 NOTE — Telephone Encounter (Signed)
I faxed a referral for this patient to Dr. Gerilyn Pilgrim for NCS on his upper extremities.

## 2011-10-19 ENCOUNTER — Telehealth: Payer: Self-pay | Admitting: Orthopedic Surgery

## 2011-10-19 NOTE — Telephone Encounter (Signed)
NCS Report received, patient scheduled for appointment, report (copy) retained for appointment, original sent for scanning.

## 2011-10-25 ENCOUNTER — Encounter: Payer: Self-pay | Admitting: Orthopedic Surgery

## 2011-11-02 ENCOUNTER — Encounter: Payer: Self-pay | Admitting: Orthopedic Surgery

## 2011-11-02 ENCOUNTER — Ambulatory Visit: Payer: 59 | Admitting: Orthopedic Surgery

## 2011-11-02 ENCOUNTER — Ambulatory Visit (INDEPENDENT_AMBULATORY_CARE_PROVIDER_SITE_OTHER): Payer: 59 | Admitting: Orthopedic Surgery

## 2011-11-02 DIAGNOSIS — M75102 Unspecified rotator cuff tear or rupture of left shoulder, not specified as traumatic: Secondary | ICD-10-CM

## 2011-11-02 DIAGNOSIS — M25519 Pain in unspecified shoulder: Secondary | ICD-10-CM

## 2011-11-02 DIAGNOSIS — M67919 Unspecified disorder of synovium and tendon, unspecified shoulder: Secondary | ICD-10-CM

## 2011-11-02 DIAGNOSIS — M542 Cervicalgia: Secondary | ICD-10-CM

## 2011-11-02 MED ORDER — GABAPENTIN 100 MG PO CAPS: 100.0000 mg | ORAL_CAPSULE | Freq: Three times a day (TID) | ORAL | Status: DC

## 2011-11-02 MED ORDER — HYDROCODONE-ACETAMINOPHEN 5-325 MG PO TABS: 1.0000 | ORAL_TABLET | ORAL | Status: DC | PRN

## 2011-11-02 NOTE — Patient Instructions (Signed)
6 hours a day at work (note)  Continue x 4 weeks   Start PT again neck and shoulder

## 2011-11-02 NOTE — Progress Notes (Signed)
March 2012 rotator cuff repair LEFT shoulder  Complains of numbness and tingling in the suprascapular region and across the shoulder  Return visit.  Continues to have burning pain, LEFT upper scapula, and supraspinatus fossa, radiating into the LEFT shoulder.  Nerve conduction study was normal.  Patient says that when he is lying on his back and tilts his head to one side. The symptoms will come on.  His shoulder repairs doing well.  Recommend physical therapy, chiropractic treatment, Neurontin. Continue work status 6 hours.  Returns 6 weeks

## 2011-11-08 ENCOUNTER — Ambulatory Visit (HOSPITAL_COMMUNITY)
Admission: RE | Admit: 2011-11-08 | Discharge: 2011-11-08 | Disposition: A | Discharge status: Home / Self Care | Payer: 59 | Source: Ambulatory Visit | Attending: Orthopedic Surgery | Admitting: Orthopedic Surgery

## 2011-11-08 DIAGNOSIS — Z9889 Other specified postprocedural states: Secondary | ICD-10-CM

## 2011-11-08 DIAGNOSIS — M542 Cervicalgia: Secondary | ICD-10-CM | POA: Insufficient documentation

## 2011-11-08 DIAGNOSIS — IMO0001 Reserved for inherently not codable concepts without codable children: Secondary | ICD-10-CM | POA: Insufficient documentation

## 2011-11-08 DIAGNOSIS — M6281 Muscle weakness (generalized): Secondary | ICD-10-CM | POA: Insufficient documentation

## 2011-11-08 NOTE — Progress Notes (Signed)
Occupational Therapy Evaluation  Patient Details  Name: Nicholas Delacruz MRN: [id number] Date of Birth: 1966/02/14  Today's Date: 11/08/2011 Time: 1478-2956 Time Calculation (min): 30 min OT Evaluation [id number]  Manual Therapy 8469-6295 16' Visit#: 1  of 8   Re-eval: 12/06/11  Assessment Diagnosis: Neck Pain and Suprascapular Pain Next MD Visit: unknown Prior Therapy: for L RCR  Past Medical History:  Past Medical History  Diagnosis Date  . High blood pressure    Past Surgical History:  Past Surgical History  Procedure Date  . Right knee sark   . Knee surgery     Subjective Symptoms/Limitations Symptoms: S:  I have been having a burning sensation in my left shoulder for 3 months. Limitations: Mr. Rodgers had surgery to repair his left rotator cuff earlier in the year and successfully completed his rehab.  Upon discharge, he began experiencing  burning sensation in his suprascapular region with no apparent cause.  He states that it happens at rest and with activity.  Dr. Romeo Apple has referred him to occupational therapy for evaluation and treatment. Pain Assessment Currently in Pain?: Yes Pain Score:   8 Pain Location: Shoulder Pain Orientation: Left Pain Type: Acute pain Pain Radiating Towards: burning pain in suprascapular region.  Assessment LUE AROM (degrees) Overall AROM Left Upper Extremity:  (Moderate fascial restrictions in left scapular region.  ) LUE Overall AROM Comments: mild scapular winging noted in left shoulder. Cervical Assessment Cervical Assessment: Within Functional Limits  Exercise/Treatments Manual Therapy Manual Therapy: Myofascial release Myofascial Release: MFR and trigger point release to left scapular region, manual cervical traction and suboccipital release to decrease pain and burning sensation in his left scapular region.  Occupational Therapy Assessment and Plan OT Assessment and Plan Clinical Impression Statement: A:  Patient  presents with increased pain and fascial restrictions in his left shoulder region causing burning pain in his left shoulder.  Patient noted increased relief from pain after MFR this date. Rehab Potential: Excellent OT Frequency: Min 2X/week OT Duration: 4 weeks OT Treatment/Interventions: Self-care/ADL training;Therapeutic exercise;Manual therapy;Therapeutic activities;Patient/family education;Other (comment) (Modalities PRN, HEP:  cervical/thoracic stretch, scap tband) OT Plan: P:  Skilled OT intervention to increase scapular alignment and stability, decrease pain and fascial restrictions.  Treatment plan:  MFR as above.  Prone scapular strengthening, w arms, x to v, ball on wall, wall pushups, cybex press and row, UBE in reverse, progress as tolerated.   Goals Short Term Goals Time to Complete Short Term Goals: 2 weeks Short Term Goal 1: Patient will be I with HEP. Short Term Goal 2: Patient will decrease scapular winging to trace. Short Term Goal 3: Patient will increase left scapular stability from good to good+. Short Term Goal 4: Patient will decrease fascial restrictions from moderate to minimal in his left scapular region. Short Term Goal 5: Patient will decrease burning pain to 5/10 in his left scapular region. Long Term Goals Time to Complete Long Term Goals: 4 weeks Long Term Goal 1: Patient will return to prior level of I with all B/IADLs and work activities. Long Term Goal 2: Patient will have normal scapular stability and 0 winging in his left shoulder region. Long Term Goal 3: Patient will decrease fasical restrictions to trace in his left shoulder region. Long Term Goal 4: Patient will decrease pain to 2/10 in his left shoulder region. End of Session Patient Active Problem List  Diagnoses  . KNEE, ARTHRITIS, DEGEN./OSTEO  . JOINT EFFUSION, LEFT KNEE  . KNEE PAIN  . TEAR  MEDIAL MENISCUS  . CONTUSION, LEFT KNEE  . HIGH BLOOD PRESSURE  . SHOULDER PAIN  . IMPINGEMENT  SYNDROME  . SUPERIOR GLENOID LABRUM TEAR  . RHEUMATOID ARTHRITIS  . RUPTURE ROTATOR CUFF  . S/P complete repair of rotator cuff  . Cervicalgia  . Rotator cuff syndrome of left shoulder   End of Session Activity Tolerance: Patient tolerated treatment well General Behavior During Session: [facility] for tasks performed Cognition: [facility] for tasks performed  Time Calculation Start Time: 1437 Stop Time: 1507 Time Calculation (min): 30 min  Shirlean Mylar, OTR/L  11/08/2011, 5:13 PM  Physician Documentation Your signature is required to indicate approval of the treatment plan as stated above.  Please sign and either send electronically or make a copy of this report for your files and return this physician signed original.  Please mark one 1.__approve of plan  2. ___approve of plan with the following conditions.   ______________________________                                                          _____________________ Physician Signature                                                                                                             Date

## 2011-11-14 ENCOUNTER — Telehealth (HOSPITAL_COMMUNITY): Payer: Self-pay

## 2011-11-14 ENCOUNTER — Ambulatory Visit (HOSPITAL_COMMUNITY): Payer: 59 | Admitting: Specialist

## 2011-11-15 ENCOUNTER — Other Ambulatory Visit: Payer: Self-pay | Admitting: Orthopedic Surgery

## 2011-11-16 ENCOUNTER — Ambulatory Visit (HOSPITAL_COMMUNITY)
Admission: RE | Admit: 2011-11-16 | Discharge: 2011-11-16 | Disposition: A | Discharge status: Home / Self Care | Payer: 59 | Source: Ambulatory Visit | Attending: Family Medicine | Admitting: Family Medicine

## 2011-11-16 DIAGNOSIS — M542 Cervicalgia: Secondary | ICD-10-CM

## 2011-11-16 DIAGNOSIS — M75102 Unspecified rotator cuff tear or rupture of left shoulder, not specified as traumatic: Secondary | ICD-10-CM

## 2011-11-16 NOTE — Progress Notes (Signed)
Occupational Therapy Treatment  Patient Details  Name: Nicholas Delacruz MRN: [id number] Date of Birth: April 19, 1966  Today's Date: 11/16/2011 Time: 4401-0272 Time Calculation (min): 26 min Manual Therapy 26' Visit#: 2  of 8   Re-eval: 12/06/11    Subjective Symptoms/Limitations Symptoms: S:  I irritated my shoulder over the weekend hunting, but it's finally starting to feel better. Pain Assessment Currently in Pain?: Yes Pain Location: Shoulder Pain Orientation: Left Pain Type: Acute pain   Exercise/Treatments Manual Therapy Manual Therapy: Myofascial release Myofascial Release: MFR and manual stretching to bilateral scapular regions, cervical region including manual traction and suboccipital release.  [id number]  Occupational Therapy Assessment and Plan OT Assessment and Plan Clinical Impression Statement: A:  Requested all ther ex be held today. OT Plan: P:  Add ther ex and cervical traction to decrease pain in cervical and shoulder region.   Goals Short Term Goals Time to Complete Short Term Goals: 2 weeks Short Term Goal 1: Patient will be I with HEP. Short Term Goal 1 Progress: Progressing toward goal Short Term Goal 2: Patient will decrease scapular winging to trace. Short Term Goal 2 Progress: Progressing toward goal Short Term Goal 3: Patient will increase left scapular stability from good to good+. Short Term Goal 3 Progress: Progressing toward goal Short Term Goal 4: Patient will decrease fascial restrictions from moderate to minimal in his left scapular region. Short Term Goal 4 Progress: Progressing toward goal Short Term Goal 5: Patient will decrease burning pain to 5/10 in his left scapular region. Short Term Goal 5 Progress: Progressing toward goal Long Term Goals Time to Complete Long Term Goals: 4 weeks Long Term Goal 1: Patient will return to prior level of I with all B/IADLs and work activities. Long Term Goal 1 Progress: Progressing toward goal Long  Term Goal 2: Patient will have normal scapular stability and 0 winging in his left shoulder region. Long Term Goal 2 Progress: Progressing toward goal Long Term Goal 3: Patient will decrease fasical restrictions to trace in his left shoulder region. Long Term Goal 3 Progress: Progressing toward goal Long Term Goal 4: Patient will decrease pain to 2/10 in his left shoulder region. Long Term Goal 4 Progress: Progressing toward goal End of Session Patient Active Problem List  Diagnoses  . KNEE, ARTHRITIS, DEGEN./OSTEO  . JOINT EFFUSION, LEFT KNEE  . KNEE PAIN  . TEAR MEDIAL MENISCUS  . CONTUSION, LEFT KNEE  . HIGH BLOOD PRESSURE  . SHOULDER PAIN  . IMPINGEMENT SYNDROME  . SUPERIOR GLENOID LABRUM TEAR  . RHEUMATOID ARTHRITIS  . RUPTURE ROTATOR CUFF  . S/P complete repair of rotator cuff  . Cervicalgia  . Rotator cuff syndrome of left shoulder   End of Session Activity Tolerance: Patient tolerated treatment well General Behavior During Session: [facility] for tasks performed Cognition: [facility] for tasks performed   Shirlean Mylar, OTR/L  11/16/2011, 2:29 PM

## 2011-11-21 ENCOUNTER — Ambulatory Visit (HOSPITAL_COMMUNITY)
Admission: RE | Admit: 2011-11-21 | Discharge: 2011-11-21 | Disposition: A | Discharge status: Home / Self Care | Payer: 59 | Source: Ambulatory Visit | Attending: Orthopedic Surgery | Admitting: Orthopedic Surgery

## 2011-11-21 DIAGNOSIS — M542 Cervicalgia: Secondary | ICD-10-CM

## 2011-11-21 DIAGNOSIS — M75102 Unspecified rotator cuff tear or rupture of left shoulder, not specified as traumatic: Secondary | ICD-10-CM

## 2011-11-22 NOTE — Progress Notes (Signed)
Occupational Therapy Treatment  Patient Details  Name: Nicholas Delacruz MRN: [id number] Date of Birth: 01/09/1966  Today's Date: 11/22/2011 Time: 156-246 Visit#: 3  of 8   Re-eval: 12/06/11 [facility] Therapy [phone]  21' Therapeutic Exercise 212-230 Cervical Traction 231-246 15'  Subjective Symptoms/Limitations Symptoms: S:  My neck is real tight. Pain Assessment Currently in Pain?: No/denies   Exercise/Treatments  11/21/11 1416 Shoulder Exercises: Supine Protraction PROM;10 reps Horizontal ABduction PROM;10 reps External Rotation PROM;10 reps Internal Rotation PROM;10 reps Flexion PROM;10 reps ABduction PROM;10 reps Shoulder Exercises: Prone Retraction AROM;10 reps Flexion AROM;10 reps Extension AROM;10 reps External Rotation AROM;10 reps Internal Rotation AROM;10 reps Horizontal ABduction 1 AROM;10 reps Horizontal ABduction 2 AROM;10 reps Shoulder Exercises: Therapy Ball Flexion 15 reps ABduction 15 reps Shoulder Exercises: ROM/Strengthening UBE (Upper Arm Bike) 3' reverse "W" Arms x10 X to V Arms x10 Newman Pies on Wall 2'     Modalities Modalities: Traction Manual Therapy Myofascial Release: MFR and trigger point release to left scapular region, manual cervical traction and suboccipital release to decrease pain and burning sensation in his left scapular region. Traction Type of Traction: Cervical Min (lbs): 10 Max (lbs): 15 Hold Time: 45" Rest Time: 15" Time: 15  Occupational Therapy Assessment and Plan   A:  Resumed all exercises and added cervical traction which patient stated he did not feel much of a change with. P:  Make changes to settings on cervical traction to see if there is a change in response to traction. Goals  Making progress toward all goals End of Session Patient Active Problem List  Diagnoses  . KNEE, ARTHRITIS, DEGEN./OSTEO  . JOINT EFFUSION, LEFT KNEE  . KNEE PAIN  . TEAR MEDIAL MENISCUS  . CONTUSION, LEFT KNEE  . HIGH BLOOD  PRESSURE  . SHOULDER PAIN  . IMPINGEMENT SYNDROME  . SUPERIOR GLENOID LABRUM TEAR  . RHEUMATOID ARTHRITIS  . RUPTURE ROTATOR CUFF  . S/P complete repair of rotator cuff  . Cervicalgia  . Rotator cuff syndrome of left shoulder    Patient tolerated treatment well.   Carlisa Eble L. Modestine Scherzinger, COTA/L  11/22/2011, 9:19 AM

## 2011-11-23 ENCOUNTER — Telehealth (HOSPITAL_COMMUNITY): Payer: Self-pay

## 2011-11-23 ENCOUNTER — Ambulatory Visit (HOSPITAL_COMMUNITY): Payer: 59 | Admitting: Occupational Therapy

## 2011-11-23 ENCOUNTER — Other Ambulatory Visit: Payer: Self-pay | Admitting: Orthopedic Surgery

## 2011-11-23 NOTE — Telephone Encounter (Signed)
Ok to fill 

## 2011-11-28 ENCOUNTER — Ambulatory Visit (HOSPITAL_COMMUNITY): Payer: 59 | Admitting: Specialist

## 2011-11-29 ENCOUNTER — Telehealth: Payer: Self-pay | Admitting: Orthopedic Surgery

## 2011-11-29 ENCOUNTER — Encounter: Payer: Self-pay | Admitting: Orthopedic Surgery

## 2011-11-29 NOTE — Telephone Encounter (Signed)
Ok as he says

## 2011-11-29 NOTE — Telephone Encounter (Addendum)
Patient called to request return to work full duty by Monday, 12/12/11.  His return appointment here, following physical therapy, is Wed, 12/14/11.      He states if he waits until 12/14/11, his employer will put him out of work completely and he will need to start the process over.  Offered him appointment 12/13/11 (as no appointments for 12/12/11).  He said even that will be too late.  He states that he is feeling better with the physical therapy.    Please advise, okay for note, or if okay to move appointment up to as early as 12/08/11.  His ph# is [phone].

## 2011-11-29 NOTE — Telephone Encounter (Signed)
Called patient and relayed.  Note entered, and patient aware to pick up.

## 2011-11-30 ENCOUNTER — Ambulatory Visit (HOSPITAL_COMMUNITY)
Admission: RE | Admit: 2011-11-30 | Discharge: 2011-11-30 | Disposition: A | Discharge status: Home / Self Care | Payer: 59 | Source: Ambulatory Visit | Attending: Orthopedic Surgery | Admitting: Orthopedic Surgery

## 2011-11-30 DIAGNOSIS — M542 Cervicalgia: Secondary | ICD-10-CM | POA: Insufficient documentation

## 2011-11-30 DIAGNOSIS — M6281 Muscle weakness (generalized): Secondary | ICD-10-CM | POA: Insufficient documentation

## 2011-11-30 DIAGNOSIS — IMO0001 Reserved for inherently not codable concepts without codable children: Secondary | ICD-10-CM | POA: Insufficient documentation

## 2011-11-30 NOTE — Progress Notes (Signed)
Occupational Therapy Treatment  Patient Details  Name: Nicholas Delacruz MRN: [id number] Date of Birth: 1966-05-30  Today's Date: 11/30/2011 Time: 4098-1191 Time Calculation (min): 52 min Manual Therapy 1355-1415 20' Therapeutic Exercises 1416-1430 14' Cervical Mechanical Traction 4782-9562 16' Visit#: 4  of 8   Re-eval: 12/06/11    Subjective Symptoms/Limitations Symptoms: S:  My arm is burning. Pain Assessment Currently in Pain?: Yes Pain Score:   2 Pain Location: Shoulder Pain Orientation: Left Pain Type: Acute pain   Exercise/Treatments Supine Protraction: PROM;10 reps Horizontal ABduction: PROM;10 reps External Rotation: PROM;10 reps Internal Rotation: PROM;10 reps Flexion: PROM;10 reps ABduction: PROM;10 reps Seated   Prone  Retraction: AROM;15 reps Flexion: AROM;15 reps Extension: AROM;15 reps External Rotation: AROM;15 reps Internal Rotation: AROM;15 reps Horizontal ABduction 1: AROM;15 reps ROM / Strengthening / Isometric Strengthening UBE (Upper Arm Bike): omit today only "W" Arms: x 15 X to V Arms: x 15 Ball on Wall: 3'    Manual Therapy Manual Therapy: Myofascial release Myofascial Release: MFR and manual therapy to left scapular region.  manual cervical traction and suboccipital release to decrease pain and discomfort in left scapular and cervical region.  155-215 burning sensation in arm gone after MFR. Traction Type of Traction: Cervical Min (lbs): 10 Max (lbs): 22 Hold Time: constant traction with 6 steps up and down and a constant hold time of 15' Time: 17  Occupational Therapy Assessment and Plan OT Assessment and Plan Clinical Impression Statement: A:  burning sensation in neck and shoulder dissipated entirely after MFR intervention. OT Plan: P:  Attempt 1 pound with scapular strengthening.   Goals Short Term Goals Time to Complete Short Term Goals: 2 weeks Short Term Goal 1: Patient will be I with HEP. Short Term Goal 1 Progress:  Progressing toward goal Short Term Goal 2: Patient will decrease scapular winging to trace. Short Term Goal 2 Progress: Progressing toward goal Short Term Goal 3: Patient will increase left scapular stability from good to good+. Short Term Goal 3 Progress: Progressing toward goal Short Term Goal 4: Patient will decrease fascial restrictions from moderate to minimal in his left scapular region. Short Term Goal 4 Progress: Progressing toward goal Short Term Goal 5: Patient will decrease burning pain to 5/10 in his left scapular region. Short Term Goal 5 Progress: Progressing toward goal Long Term Goals Time to Complete Long Term Goals: 4 weeks Long Term Goal 1: Patient will return to prior level of I with all B/IADLs and work activities. Long Term Goal 1 Progress: Progressing toward goal Long Term Goal 2: Patient will have normal scapular stability and 0 winging in his left shoulder region. Long Term Goal 2 Progress: Progressing toward goal Long Term Goal 3: Patient will decrease fasical restrictions to trace in his left shoulder region. Long Term Goal 3 Progress: Progressing toward goal Long Term Goal 4: Patient will decrease pain to 2/10 in his left shoulder region. Long Term Goal 4 Progress: Progressing toward goal End of Session Patient Active Problem List  Diagnoses  . KNEE, ARTHRITIS, DEGEN./OSTEO  . JOINT EFFUSION, LEFT KNEE  . KNEE PAIN  . TEAR MEDIAL MENISCUS  . CONTUSION, LEFT KNEE  . HIGH BLOOD PRESSURE  . SHOULDER PAIN  . IMPINGEMENT SYNDROME  . SUPERIOR GLENOID LABRUM TEAR  . RHEUMATOID ARTHRITIS  . RUPTURE ROTATOR CUFF  . S/P complete repair of rotator cuff  . Cervicalgia  . Rotator cuff syndrome of left shoulder   End of Session Activity Tolerance: Patient tolerated treatment well  General Behavior During Session: [facility] for tasks performed Cognition: [facility] for tasks performed   Shirlean Mylar, OTR/L  11/30/2011, 3:11 PM

## 2011-12-05 ENCOUNTER — Ambulatory Visit (HOSPITAL_COMMUNITY)
Admission: RE | Admit: 2011-12-05 | Discharge: 2011-12-05 | Disposition: A | Discharge status: Home / Self Care | Payer: 59 | Source: Ambulatory Visit | Attending: Family Medicine | Admitting: Family Medicine

## 2011-12-05 DIAGNOSIS — M75102 Unspecified rotator cuff tear or rupture of left shoulder, not specified as traumatic: Secondary | ICD-10-CM

## 2011-12-05 DIAGNOSIS — M542 Cervicalgia: Secondary | ICD-10-CM

## 2011-12-05 NOTE — Progress Notes (Signed)
Occupational Therapy Treatment  Patient Details  Name: Nicholas Delacruz MRN: [id number] Date of Birth: [birth year]  Today's Date: 12/05/2011 Time: 4098-1191 Time Calculation (min): 29 min Visit#: 5  of 8   Re-eval: 12/06/11    Subjective Symptoms/Limitations Symptoms: S:  The burning has gotten better. Pain Assessment Currently in Pain?: No/denies   Exercise/Treatments  No Time-patient with MD appointment and arrived late.   Modalities Modalities: Traction Traction Type of Traction: Cervical Min (lbs): 10 Max (lbs): 22 Hold Time: static traction 6 up 6 down hold time 15" Rest Time: 15" Time: 17'  Occupational Therapy Assessment and Plan OT Assessment and Plan Clinical Impression Statement: A:  Patient arrived late and has to leave early secondary to MD appointment.  Only time for cervical traction. Rehab Potential: Excellent OT Plan: P:  Resume MFR and ther-ex.  Reassess   Goals Short Term Goals Time to Complete Short Term Goals: 2 weeks Short Term Goal 1: Patient will be I with HEP. Short Term Goal 2: Patient will decrease scapular winging to trace. Short Term Goal 3: Patient will increase left scapular stability from good to good+. Short Term Goal 4: Patient will decrease fascial restrictions from moderate to minimal in his left scapular region. Short Term Goal 5: Patient will decrease burning pain to 5/10 in his left scapular region. Long Term Goals Time to Complete Long Term Goals: 4 weeks Long Term Goal 1: Patient will return to prior level of I with all B/IADLs and work activities. Long Term Goal 2: Patient will have normal scapular stability and 0 winging in his left shoulder region. Long Term Goal 3: Patient will decrease fasical restrictions to trace in his left shoulder region. Long Term Goal 4: Patient will decrease pain to 2/10 in his left shoulder region. End of Session Patient Active Problem List  Diagnoses  . KNEE, ARTHRITIS, DEGEN./OSTEO  .  JOINT EFFUSION, LEFT KNEE  . KNEE PAIN  . TEAR MEDIAL MENISCUS  . CONTUSION, LEFT KNEE  . HIGH BLOOD PRESSURE  . SHOULDER PAIN  . IMPINGEMENT SYNDROME  . SUPERIOR GLENOID LABRUM TEAR  . RHEUMATOID ARTHRITIS  . RUPTURE ROTATOR CUFF  . S/P complete repair of rotator cuff  . Cervicalgia  . Rotator cuff syndrome of left shoulder   End of Session Activity Tolerance: Patient tolerated treatment well General Cognition: [facility] for tasks performed   Ronica Vivian L. Zaylon Bossier, COTA/L  12/05/2011, 4:46 PM

## 2011-12-07 ENCOUNTER — Ambulatory Visit (HOSPITAL_COMMUNITY)
Admission: RE | Admit: 2011-12-07 | Discharge: 2011-12-07 | Disposition: A | Discharge status: Home / Self Care | Payer: 59 | Source: Ambulatory Visit | Attending: Family Medicine | Admitting: Family Medicine

## 2011-12-07 DIAGNOSIS — M75102 Unspecified rotator cuff tear or rupture of left shoulder, not specified as traumatic: Secondary | ICD-10-CM

## 2011-12-07 DIAGNOSIS — M542 Cervicalgia: Secondary | ICD-10-CM

## 2011-12-07 NOTE — Progress Notes (Signed)
Occupational Therapy Treatment  Patient Details  Name: NISHAAN STANKE MRN: [id number] Date of Birth: [birth year]  Today's Date: 12/07/2011 Time: 4098-1191 Time Calculation (min): 38 min Visit#: 6  of 8   Re-eval: 12/06/11 Manual Therapy 148-206 21'  Cervical Traction 208-226 (17' actual traction)  Subjective Symptoms/Limitations Symptoms: S:  I have had some tingling but it is getting better, almost gone.   Manual Therapy Manual Therapy: Myofascial release Myofascial Release: MFR and trigger point release to left scapular region, manual cervical traction and suboccipital release to decrease pain and burning sensation in his left scapular region. Traction Type of Traction: Cervical Min (lbs): 10 Max (lbs): 22 Rest Time: 15" Time: 17'  Occupational Therapy Assessment and Plan OT Assessment and Plan Clinical Impression Statement: A:  Patient requested manual and traction only. Wants to do exercies at home. OT Plan: P:  Reassess and possible d/c.   Goals Short Term Goals Time to Complete Short Term Goals: 2 weeks Short Term Goal 1: Patient will be I with HEP. Short Term Goal 2: Patient will decrease scapular winging to trace. Short Term Goal 3: Patient will increase left scapular stability from good to good+. Short Term Goal 4: Patient will decrease fascial restrictions from moderate to minimal in his left scapular region. Short Term Goal 5: Patient will decrease burning pain to 5/10 in his left scapular region. Long Term Goals Time to Complete Long Term Goals: 4 weeks Long Term Goal 1: Patient will return to prior level of I with all B/IADLs and work activities. Long Term Goal 2: Patient will have normal scapular stability and 0 winging in his left shoulder region. Long Term Goal 3: Patient will decrease fasical restrictions to trace in his left shoulder region. Long Term Goal 4: Patient will decrease pain to 2/10 in his left shoulder region. End of Session Patient Active  Problem List  Diagnoses  . KNEE, ARTHRITIS, DEGEN./OSTEO  . JOINT EFFUSION, LEFT KNEE  . KNEE PAIN  . TEAR MEDIAL MENISCUS  . CONTUSION, LEFT KNEE  . HIGH BLOOD PRESSURE  . SHOULDER PAIN  . IMPINGEMENT SYNDROME  . SUPERIOR GLENOID LABRUM TEAR  . RHEUMATOID ARTHRITIS  . RUPTURE ROTATOR CUFF  . S/P complete repair of rotator cuff  . Cervicalgia  . Rotator cuff syndrome of left shoulder   End of Session Activity Tolerance: Patient tolerated treatment well General Behavior During Session: [facility] for tasks performed Cognition: [facility] for tasks performed   Sarvesh Meddaugh L. Fonnie Crookshanks, COTA/L  12/07/2011, 2:30 PM

## 2011-12-12 ENCOUNTER — Ambulatory Visit (HOSPITAL_COMMUNITY): Payer: 59 | Admitting: Occupational Therapy

## 2011-12-14 ENCOUNTER — Ambulatory Visit: Payer: 59 | Admitting: Orthopedic Surgery

## 2012-01-05 ENCOUNTER — Ambulatory Visit: Payer: 59 | Admitting: Orthopedic Surgery

## 2012-01-06 ENCOUNTER — Encounter: Payer: Self-pay | Admitting: Orthopedic Surgery

## 2012-02-20 ENCOUNTER — Telehealth: Payer: Self-pay | Admitting: Orthopedic Surgery

## 2012-02-20 NOTE — Telephone Encounter (Signed)
Patient advised.

## 2012-02-20 NOTE — Telephone Encounter (Signed)
Nicholas Delacruz says his right knee is hurting, he was scheduled to see you 02/29/12, but can't come that day because of work schedule. Asking if you can prescribe some pain medicine and fax to [facility]

## 2012-02-20 NOTE — Telephone Encounter (Signed)
No, see me on 3/6

## 2012-02-22 ENCOUNTER — Ambulatory Visit: Payer: 59 | Admitting: Orthopedic Surgery

## 2012-02-29 ENCOUNTER — Ambulatory Visit (INDEPENDENT_AMBULATORY_CARE_PROVIDER_SITE_OTHER): Payer: 59 | Admitting: Orthopedic Surgery

## 2012-02-29 ENCOUNTER — Ambulatory Visit: Payer: 59 | Admitting: Orthopedic Surgery

## 2012-02-29 ENCOUNTER — Encounter: Payer: Self-pay | Admitting: Orthopedic Surgery

## 2012-02-29 VITALS — BP 150/80 | Ht 72.0 in | Wt 300.0 lb

## 2012-02-29 DIAGNOSIS — IMO0002 Reserved for concepts with insufficient information to code with codable children: Secondary | ICD-10-CM

## 2012-02-29 DIAGNOSIS — M1710 Unilateral primary osteoarthritis, unspecified knee: Secondary | ICD-10-CM

## 2012-02-29 MED ORDER — HYDROCODONE-ACETAMINOPHEN 10-325 MG PO TABS: 1.0000 | ORAL_TABLET | ORAL | Status: AC | PRN

## 2012-02-29 NOTE — Progress Notes (Signed)
Knee  Injection Procedure Note  Pre-operative Diagnosis: right knee oa  Post-operative Diagnosis: same  Indications: pain  Anesthesia: ethyl chloride   Procedure Details   Verbal consent was obtained for the procedure. Time out was completed.The joint was prepped with alcohol, followed by  Ethyl chloride spray and A 20 gauge needle was inserted into the knee via lateral approach; 4ml 1% lidocaine and 1 ml of depomedrol  was then injected into the joint . The needle was removed and the area cleansed and dressed.  Complications:  None; patient tolerated the procedure well.  

## 2012-02-29 NOTE — Patient Instructions (Signed)
You have received a steroid shot. 15% of patients experience increased pain at the injection site with in the next 24 hours. This is best treated with ice and tylenol extra strength 2 tabs every 8 hours. If you are still having pain please call the office.    

## 2012-02-29 NOTE — Progress Notes (Signed)
Patient ID: Nicholas Delacruz, male   DOB: 06/09/1966, 46 y.o.   MRN: [id number] Chief Complaint  Patient presents with  . Knee Pain    new injury to right knee (twisted) 2 weeks ago     BP 150/80  Ht 6' (1.829 m)  Wt 300 lb (136.079 kg)  BMI 40.69 kg/m2   xrays right knee  Subjective:    Nicholas Delacruz is a 46 y.o. male who presents Extensive increasing RIGHT knee pain. 5 years ago. He had a RIGHT knee scope. He had bad arthritis. At that time. He is complaining of sharp throbbing, stabbing, 8/10. Intermittent pain, which is worse with walking. He has some locking and catching says it feels like something is in. There.  The following portions of the patient's history were reviewed and updated as appropriate: allergies, current medications, past family history, past medical history, past social history, past surgical history and problem list.   Review of Systems as stated    Objective:    BP 150/80  Ht 6' (1.829 m)  Wt 300 lb (136.079 kg)  BMI 40.69 kg/m2  Vital signs are stable as recorded  General appearance is normal  The patient is alert and oriented x3  The patient's mood and affect are normal  Gait assessment: he is limping favoring his RIGHT lower extremity The cardiovascular exam reveals normal pulses and temperature without edema swelling.  The lymphatic system is negative for palpable lymph nodes  The sensory exam is normal.  There are no pathologic reflexes.  Balance is normal.   Exam of the RIGHT knee Inspection tenderness over the lateral compartment, however, there is no joint effusion. His passive range of motion is only 90. However the knee is stable. Strength is normal. Skin is intact with no redness or erythema. The joint does not feel warm.   X-ray right knee: the knee is in slight valgus. The lateral compartment is significantly diminished compared to his thumb in 2008. He has multiple osteophytes around his patellofemoral joint. There is  also a femoral osteophyte. He has decreased joint space in his medial patellar facet trochlear area.  impression arthritis, primarily lateral, with patellofemoral disease, severe   Assessment:    Right knee osteoarthritis    Plan:    inject knee. Pain medication. Return in 2 weeks to see if this helped. Prognosis is poor. Patient will probably need some of the procedure. Knee replacement versus arthroscopy

## 2012-03-14 ENCOUNTER — Ambulatory Visit: Payer: 59 | Admitting: Orthopedic Surgery

## 2012-04-16 ENCOUNTER — Encounter: Payer: Self-pay | Admitting: Orthopedic Surgery

## 2012-04-28 ENCOUNTER — Other Ambulatory Visit: Payer: Self-pay | Admitting: Orthopedic Surgery

## 2012-07-28 ENCOUNTER — Emergency Department (HOSPITAL_COMMUNITY)
Admission: EM | Admit: 2012-07-28 | Discharge: 2012-07-28 | Disposition: A | Discharge status: Home / Self Care | Payer: 59 | Attending: Emergency Medicine | Admitting: Emergency Medicine

## 2012-07-28 ENCOUNTER — Encounter (HOSPITAL_COMMUNITY): Payer: Self-pay | Admitting: *Deleted

## 2012-07-28 DIAGNOSIS — G4733 Obstructive sleep apnea (adult) (pediatric): Secondary | ICD-10-CM | POA: Insufficient documentation

## 2012-07-28 DIAGNOSIS — I10 Essential (primary) hypertension: Secondary | ICD-10-CM | POA: Insufficient documentation

## 2012-07-28 DIAGNOSIS — J020 Streptococcal pharyngitis: Secondary | ICD-10-CM

## 2012-07-28 HISTORY — DX: Dependence on other enabling machines and devices: Z99.89

## 2012-07-28 HISTORY — DX: Obstructive sleep apnea (adult) (pediatric): G47.33

## 2012-07-28 LAB — RAPID STREP SCREEN (MED CTR MEBANE ONLY): Streptococcus, Group A Screen (Direct): POSITIVE — AB

## 2012-07-28 MED ORDER — AMOXICILLIN 500 MG PO CAPS: 500.0000 mg | ORAL_CAPSULE | Freq: Three times a day (TID) | ORAL | Status: DC

## 2012-07-28 MED ORDER — PENICILLIN G BENZATHINE 1200000 UNIT/2ML IM SUSP
1.2000 10*6.[IU] | Freq: Once | INTRAMUSCULAR | Status: AC
  Administered 2012-07-28: 1.2 10*6.[IU] via INTRAMUSCULAR
  Filled 2012-07-28: qty 2

## 2012-07-28 NOTE — ED Notes (Signed)
Pt states sore throat x ~ 3 days. Pt also states he had a fever Thurs and Fri. NAD.

## 2012-07-28 NOTE — ED Provider Notes (Signed)
History     CSN: [id number]  Arrival date & time 07/28/12  1517   First MD Initiated Contact with Patient 07/28/12 1525      Chief Complaint  Patient presents with  . Sore Throat    HPI Pt was seen at 1525.  Per pt, c/o gradual onset and persistence of constant sore throat x3 days.  Has been associated with home fevers to "101," as well as bilat ears "pain."  Denies cough, no rash, no SOB, no N/V/D.   Past Medical History  Diagnosis Date  . High blood pressure   . Obstructive sleep apnea on CPAP     Past Surgical History  Procedure Date  . Right knee sark   . Knee surgery   . Shoulder surgery     left    Family History  Problem Relation Age of Onset  . Arthritis      History  Substance Use Topics  . Smoking status: Never Smoker   . Smokeless tobacco: Not on file  . Alcohol Use: Yes     5th of Vodka every 2 weeks     Review of Systems ROS: Statement: All systems negative except as marked or noted in the HPI; Constitutional: +fever and chills. ; ; Eyes: Negative for eye pain, redness and discharge. ; ; ENMT: +ear pain, sore throat. Negative for hoarseness, nasal congestion, sinus pressure and sore throat. ; ; Cardiovascular: Negative for chest pain, palpitations, diaphoresis, dyspnea and peripheral edema. ; ; Respiratory: Negative for cough, wheezing and stridor. ; ; Gastrointestinal: Negative for nausea, vomiting, diarrhea, abdominal pain, blood in stool, hematemesis, jaundice and rectal bleeding. . ; ; Genitourinary: Negative for dysuria, flank pain and hematuria. ; ; Musculoskeletal: Negative for back pain and neck pain. Negative for swelling and trauma.; ; Skin: Negative for pruritus, rash, abrasions, blisters, bruising and skin lesion.; ; Neuro: Negative for headache, lightheadedness and neck stiffness. Negative for weakness, altered level of consciousness , altered mental status, extremity weakness, paresthesias, involuntary movement, seizure and syncope.      Allergies  Levofloxacin and Levofloxacin  Home Medications   Current Outpatient Rx  Name Route Sig Dispense Refill  . LOTREL PO Oral Take by mouth.      . DEXILANT 60 MG PO CPDR      . DICLOFENAC SODIUM 75 MG PO TBEC      . GABAPENTIN 100 MG PO CAPS Oral Take 1 capsule (100 mg total) by mouth 3 (three) times daily. 90 capsule 2  . LABETALOL HCL 200 MG PO TABS      . MOTRIN 800 MG PO TABS  TAKE (1) TABLET BY MOUTH EVERY EIGHT HOURS AS NEEDED. 90 each 1  . ZOLPIDEM TARTRATE 10 MG PO TABS Oral Take 10 mg by mouth.        BP 138/78  Pulse 79  Temp 98.5 F (36.9 C) (Oral)  Resp 20  SpO2 97%  Physical Exam 1530: Physical examination:  Nursing notes reviewed; Vital signs and O2 SAT reviewed;  Constitutional: Well developed, Well nourished, Well hydrated, In no acute distress; Head:  Normocephalic, atraumatic; Eyes: EOMI, PERRL, No scleral icterus; ENMT: TM's clear bilat.  +edemetous nasal turbinates bilat with clear rhinorrhea. Mouth and pharynx without intra-oral edema, no lesions, no hoarse voice, no drooling, no stridor. +mild post pharyngeal erythema. Mucous membranes moist; Neck: Supple, Full range of motion, No lymphadenopathy; Cardiovascular: Regular rate and rhythm, No murmur, rub, or gallop; Respiratory: Breath sounds clear & equal bilaterally, No  rales, rhonchi, wheezes.  Speaking full sentences with ease, Normal respiratory effort/excursion; Chest: Nontender, Movement normal;; Extremities: Pulses normal, No tenderness, No edema, No calf edema or asymmetry.; Neuro: AA&Ox3, Major CN grossly intact.  Speech clear. No gross focal motor or sensory deficits in extremities.; Skin: Color normal, Warm, Dry.   ED Course  Procedures    MDM  MDM Reviewed: nursing note and vitals Interpretation: labs    Results for orders placed during the hospital encounter of 07/28/12  RAPID STREP SCREEN      Component Value Range   Streptococcus, Group A Screen (Direct) POSITIVE (*)  NEGATIVE     1610:  Pt requests "a shot" vs PO meds.  Will dose IM PCN.  Dx testing d/w pt.  Questions answered.  Verb understanding, agreeable to d/c home with outpt f/u.       Laray Anger, DO 07/30/12 1309

## 2012-11-13 ENCOUNTER — Encounter: Payer: Self-pay | Admitting: Orthopedic Surgery

## 2012-11-13 ENCOUNTER — Ambulatory Visit (INDEPENDENT_AMBULATORY_CARE_PROVIDER_SITE_OTHER): Payer: 59

## 2012-11-13 ENCOUNTER — Ambulatory Visit (INDEPENDENT_AMBULATORY_CARE_PROVIDER_SITE_OTHER): Payer: 59 | Admitting: Orthopedic Surgery

## 2012-11-13 VITALS — Ht 72.0 in | Wt 317.0 lb

## 2012-11-13 DIAGNOSIS — M25569 Pain in unspecified knee: Secondary | ICD-10-CM

## 2012-11-13 DIAGNOSIS — M1710 Unilateral primary osteoarthritis, unspecified knee: Secondary | ICD-10-CM

## 2012-11-13 MED ORDER — HYDROCODONE-ACETAMINOPHEN 7.5-325 MG PO TABS: 1.0000 | ORAL_TABLET | Freq: Four times a day (QID) | ORAL | Status: DC | PRN

## 2012-11-13 NOTE — Progress Notes (Signed)
Patient ID: Nicholas Delacruz, male   DOB: [birth year], 46 y.o.   MRN: [id number] Chief Complaint  Patient presents with  . Follow-up    Recheck on left knee., Pain and swelling   46 year old male 2 years post microfracture, medial meniscectomy, LEFT knee complains of Recent onset of severe, nonradiating, anterior knee pain and swelling.  Review of systems he complains of a fractured foot, which was treated for gout for 2 weeks with steroids and then eventually treated with appropriate immobilization.  Neurologic review negative  Physical Exam(12) GENERAL: normal development   CDV: pulses are normal   Skin: normal  Lymph: nodes were not palpable/normal  Psychiatric: awake, alert and oriented  Neuro: normal sensation  MSK LEFT knee ambulation. Normal 1 Mild joint effusion, tenderness, superior pole of patella 2 Strength normal 3 Stability normal 4 Range of motion painful and 120 with a painful arc of motion of 90-120 5 McMurray sign is negative 6 Opposite knee is normal  Assessment: X-ray shows degenerative arthritis, moderate    Plan: Injection, brace, hydrocodone for pain  Knee  Injection Procedure Note  Pre-operative Diagnosis: left knee oa  Post-operative Diagnosis: same  Indications: pain  Anesthesia: ethyl chloride   Procedure Details   Verbal consent was obtained for the procedure. Time out was completed.The joint was prepped with alcohol, followed by  Ethyl chloride spray and A 20 gauge needle was inserted into the knee via lateral approach; 4ml 1% lidocaine and 1 ml of depomedrol  was then injected into the joint . The needle was removed and the area cleansed and dressed.  Complications:  None; patient tolerated the procedure well.

## 2012-11-13 NOTE — Patient Instructions (Addendum)
You have received a steroid shot. 15% of patients experience increased pain at the injection site with in the next 24 hours. This is best treated with ice and tylenol extra strength 2 tabs every 8 hours. If you are still having pain please call the office.    

## 2013-08-13 ENCOUNTER — Ambulatory Visit: Payer: 59 | Admitting: Orthopedic Surgery

## 2014-01-10 ENCOUNTER — Other Ambulatory Visit (HOSPITAL_COMMUNITY): Payer: Self-pay | Admitting: Physician Assistant

## 2014-01-10 DIAGNOSIS — M2390 Unspecified internal derangement of unspecified knee: Secondary | ICD-10-CM

## 2014-01-14 ENCOUNTER — Ambulatory Visit (HOSPITAL_COMMUNITY)
Admission: RE | Admit: 2014-01-14 | Discharge: 2014-01-14 | Disposition: A | Discharge status: Home / Self Care | Payer: 59 | Source: Ambulatory Visit | Attending: Physician Assistant | Admitting: Physician Assistant

## 2014-01-14 DIAGNOSIS — M659 Unspecified synovitis and tenosynovitis, unspecified site: Secondary | ICD-10-CM | POA: Insufficient documentation

## 2014-01-14 DIAGNOSIS — Z9889 Other specified postprocedural states: Secondary | ICD-10-CM | POA: Insufficient documentation

## 2014-01-14 DIAGNOSIS — M25469 Effusion, unspecified knee: Secondary | ICD-10-CM | POA: Insufficient documentation

## 2014-01-14 DIAGNOSIS — M25569 Pain in unspecified knee: Secondary | ICD-10-CM | POA: Insufficient documentation

## 2014-01-14 DIAGNOSIS — M259 Joint disorder, unspecified: Secondary | ICD-10-CM | POA: Insufficient documentation

## 2014-01-14 DIAGNOSIS — M2390 Unspecified internal derangement of unspecified knee: Secondary | ICD-10-CM

## 2014-01-21 ENCOUNTER — Encounter: Payer: Self-pay | Admitting: Orthopedic Surgery

## 2014-01-21 ENCOUNTER — Ambulatory Visit (INDEPENDENT_AMBULATORY_CARE_PROVIDER_SITE_OTHER): Payer: 59 | Admitting: Orthopedic Surgery

## 2014-01-21 VITALS — BP 155/84 | Ht 73.0 in | Wt 336.0 lb

## 2014-01-21 DIAGNOSIS — M129 Arthropathy, unspecified: Secondary | ICD-10-CM

## 2014-01-21 DIAGNOSIS — M1990 Unspecified osteoarthritis, unspecified site: Secondary | ICD-10-CM

## 2014-01-21 DIAGNOSIS — M25569 Pain in unspecified knee: Secondary | ICD-10-CM

## 2014-01-21 DIAGNOSIS — IMO0002 Reserved for concepts with insufficient information to code with codable children: Secondary | ICD-10-CM

## 2014-01-21 DIAGNOSIS — M1712 Unilateral primary osteoarthritis, left knee: Secondary | ICD-10-CM | POA: Insufficient documentation

## 2014-01-21 DIAGNOSIS — M1710 Unilateral primary osteoarthritis, unspecified knee: Secondary | ICD-10-CM

## 2014-01-21 MED ORDER — HYDROCODONE-ACETAMINOPHEN 7.5-325 MG PO TABS: 1.0000 | ORAL_TABLET | Freq: Four times a day (QID) | ORAL | Status: DC | PRN

## 2014-01-21 MED ORDER — DICLOFENAC SODIUM 50 MG PO TBEC: 50.0000 mg | DELAYED_RELEASE_TABLET | Freq: Two times a day (BID) | ORAL | Status: DC

## 2014-01-21 NOTE — Progress Notes (Signed)
Subjective:     Patient ID: Nicholas Delacruz, male   DOB: 1966/11/05, 48 y.o.   MRN: [id number]  Chief Complaint  Patient presents with  . Knee Pain    left knee pain, no injury    Knee Pain    48 year old male presents with 3 week history of severe pain in his left knee causing him to limp. Denies any trauma. In 2011 had microfracture partial medial meniscectomy has had been up and down course since then but was in stable condition functioning well until 3 weeks ago. Pain swelling over the medial side of the knee severe  Patient complained of inability to ambulate and a significant limp  Get an MRI by his primary care physician which showed severe arthritis of left knee primarily medial compartment with synovitis of the joint   Review of Systems Numbness tingling denied gait disturbance noted.    Objective:   Physical Exam Physical Exam(12)  Vital signs:   1.GENERAL: normal development   2. CDV: pulses are normal   3. Skin: normal  4. Lymph: nodes were not palpable/normal  5/6. Psychiatric: awake, alert and oriented, mood and affect normal   7. Neuro: normal sensation  8.   MSK  Gait: Significant limp noted in the left knee 9.   Inspection tenderness over the medial compartment quite severe 10. Range of Motion knee flexion 120 slight flexion contracture 11. Motor extension strength normal 12. Stability collaterals and cruciate ligament stable   Imaging MRI shows no meniscal tear with severe arthritis  Assessment: Severe arthritis left knee    Plan: Arthroscopy left knee debridement     Assessment:     As noted above    Plan:     See above  Inject left knee patient will call when he is ready for surgery arthroscopy left knee with debridement  Knee  Injection Procedure Note  Pre-operative Diagnosis: left knee oa  Post-operative Diagnosis: same  Indications: pain  Anesthesia: ethyl chloride   Procedure Details   Verbal consent was obtained for  the procedure. Time out was completed.The joint was prepped with alcohol, followed by  Ethyl chloride spray and A 20 gauge needle was inserted into the knee via lateral approach; 48ml 1% lidocaine and 1 ml of depomedrol  was then injected into the joint . The needle was removed and the area cleansed and dressed.  Complications:  None; patient tolerated the procedure well.

## 2014-01-21 NOTE — Patient Instructions (Signed)
Call back to schedule knee scope   You have received a steroid shot. 15% of patients experience increased pain at the injection site with in the next 24 hours. This is best treated with ice and tylenol extra strength 2 tabs every 8 hours. If you are still having pain please call the office.

## 2014-01-23 ENCOUNTER — Telehealth: Payer: Self-pay | Admitting: Orthopedic Surgery

## 2014-01-23 NOTE — Telephone Encounter (Signed)
Nicholas Delacruz said a prescription for arthritis was supposed to be sent to [facility] but he checked on it today and they had no received it

## 2014-01-23 NOTE — Telephone Encounter (Signed)
Called diclofenac(as directed) in to [facility].

## 2014-02-03 ENCOUNTER — Telehealth: Payer: Self-pay | Admitting: Radiology

## 2014-02-03 NOTE — Telephone Encounter (Signed)
Patient is asking for Voltaren gel prescription, he says that he has had it before and it worked well. He uses Personal assistant.

## 2014-02-03 NOTE — Telephone Encounter (Signed)
Refill

## 2014-02-04 ENCOUNTER — Other Ambulatory Visit: Payer: Self-pay | Admitting: *Deleted

## 2014-02-04 DIAGNOSIS — M1990 Unspecified osteoarthritis, unspecified site: Secondary | ICD-10-CM

## 2014-02-04 MED ORDER — DICLOFENAC SODIUM 1 % TD GEL: 4.0000 g | Freq: Four times a day (QID) | TRANSDERMAL | Status: DC

## 2014-02-04 NOTE — Telephone Encounter (Signed)
Sent in Clinical research associate to [facility].

## 2014-03-13 ENCOUNTER — Ambulatory Visit (HOSPITAL_COMMUNITY)
Admission: RE | Admit: 2014-03-13 | Discharge: 2014-03-13 | Disposition: A | Discharge status: Home / Self Care | Payer: 59 | Source: Ambulatory Visit | Attending: Orthopaedic Surgery | Admitting: Orthopaedic Surgery

## 2014-03-13 DIAGNOSIS — IMO0001 Reserved for inherently not codable concepts without codable children: Secondary | ICD-10-CM | POA: Insufficient documentation

## 2014-03-13 DIAGNOSIS — R29898 Other symptoms and signs involving the musculoskeletal system: Secondary | ICD-10-CM | POA: Insufficient documentation

## 2014-03-13 DIAGNOSIS — R262 Difficulty in walking, not elsewhere classified: Secondary | ICD-10-CM | POA: Insufficient documentation

## 2014-03-13 DIAGNOSIS — M25569 Pain in unspecified knee: Secondary | ICD-10-CM | POA: Insufficient documentation

## 2014-03-13 DIAGNOSIS — M25669 Stiffness of unspecified knee, not elsewhere classified: Secondary | ICD-10-CM | POA: Insufficient documentation

## 2014-03-13 DIAGNOSIS — M6281 Muscle weakness (generalized): Secondary | ICD-10-CM | POA: Insufficient documentation

## 2014-03-13 NOTE — Evaluation (Addendum)
Physical Therapy Evaluation  Patient Details  Name: FIELDING MAULT MRN: [id number] Date of Birth: 04/04/1966  Today's Date: 03/13/2014 Time: 1515-1600 PT Time Calculation (min): 45 min Charge:  evaluation             Visit#: 1 of 12  Re-eval: 04/12/14 Assessment Diagnosis: Lt knee pain Next MD Visit: 03/22/2014 Prior Therapy: none  Authorization: [employer]    Past Medical History:  Past Medical History  Diagnosis Date  . High blood pressure   . Obstructive sleep apnea on CPAP    Past Surgical History:  Past Surgical History  Procedure Laterality Date  . Right knee sark    . Knee surgery    . Shoulder surgery      left    Subjective Symptoms/Limitations Symptoms: Mr. Rudnick states that he has had significant pain in his Lt knee for the past month.  He states that MRI showed bone on bone.  At one point the pain was so great he could not walk without crutches.  He has been referred to PT to hold off a TKR.   Pertinent History: Pt is taking ibuprofen 800 mg and pain cream;  How long can you sit comfortably?: Pt states if he sits greater than 10-15 minutes he becomes stiff. How long can you stand comfortably?: Able to stand for ten minutes  How long can you walk comfortably?: Pt can walk for 2-3 minutes before he has increased pain.   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 Leisure: Hobbies-yes (Comment) Comments: farm, hunt    Sensation/Coordination/Flexibility/Functional Tests Flexibility Obers:   (tight quads )  Assessment LLE AROM (degrees) Left Hip Extension: 10 Left Hip Flexion: 100 LLE Strength Left Hip Flexion:  (4-/5) Left Hip Extension: 4/5 Left Hip ABduction: 3+/5 Left Knee Flexion:  (4-/5) Left Knee Extension:  (4-/5) Left Ankle Dorsiflexion: 4/5  Exercise/Treatments     Seated Long Arc Quad: 10 reps Supine Quad Sets: 10 reps Heel Slides: 5 reps Straight Leg Raises: 10 reps Sidelying Hip ABduction: 5 reps Prone  Hamstring Curl: 5 sets Hip Extension: 5 reps   Physical Therapy Assessment and Plan PT Assessment and Plan Clinical Impression Statement: Pt is a 48 yo male with OA of Lt knee causing increased pain, stiffness, weakness and difficulty walking.  Pt has been referred to PT to address these issues.  Pt will benefit from skilled PT to maximize his functional abilitity. Pt will benefit from skilled therapeutic intervention in order to improve on the following deficits: Difficulty walking;Decreased strength;Decreased range of motion;Pain Rehab Potential: Good PT Frequency: Min 3X/week PT Duration: 4 weeks PT Treatment/Interventions: Therapeutic activities;Therapeutic exercise;Modalities;Manual techniques PT Plan: Pt to begin LE strengthening and stretching pt to receive manual or modalities if needed    Goals Home Exercise Program Pt/caregiver will Perform Home Exercise Program: For increased strengthening PT Short Term Goals Time to Complete Short Term Goals: 2 weeks PT Short Term Goal 1: Pt pain to be no greater than a 6/10 80% of the day PT Short Term Goal 2: Pt  to be able to sit for 30 mintues without stiffness to be able to enjoy a meal PT Short Term Goal 3: Pt to be able to stand for 30 minutes without pain to be able to make a small meal PT Short Term Goal 4: Pt to be able to walk x 10 minutes to be able to pick up items at the grocery store. PT Long Term Goals Time to Complete Long Term Goals: 4 weeks PT Long Term Goal 1: I in advance  HEP PT Long Term Goal 2: Pain no greater than a 3/10 80% of the day Long Term Goal 3: able to sit for an hour in order to travel Long Term Goal 4: able to to stand for an hour in order to go back to work PT Long Term Goal 5: able to walk for 20 minutes at a  time for healthier lifestyle.  Problem List Patient Active Problem List   Diagnosis Date Noted  . Weakness of left leg 03/13/2014  . Difficulty in walking(719.7) 03/13/2014  . Stiffness of joint, not elsewhere classified, lower leg 03/13/2014  . Arthritis of knee, left 01/21/2014  . Arthritis of knee 11/13/2012  . Cervicalgia 11/08/2011  . Rotator cuff syndrome of left shoulder 11/08/2011  . S/P complete repair of rotator cuff 07/21/2011  . RHEUMATOID ARTHRITIS 02/01/2011  . RUPTURE ROTATOR CUFF 02/01/2011  . SUPERIOR GLENOID LABRUM TEAR 01/12/2011  . SHOULDER PAIN 12/07/2010  . IMPINGEMENT SYNDROME 12/07/2010  . CONTUSION, LEFT KNEE 03/16/2010  . KNEE, ARTHRITIS, DEGEN./OSTEO 09/28/2009  . JOINT EFFUSION, LEFT KNEE 08/25/2009  . KNEE PAIN 05/14/2009  . TEAR MEDIAL MENISCUS 05/14/2009  . HIGH BLOOD PRESSURE 05/13/2009    General Behavior During Therapy: [facility] for tasks assessed/performed PT Plan of Care PT Home Exercise Plan: given  GP    Fantashia Shupert,CINDY 03/13/2014, 4:53 PM  Physician Documentation Your signature is required to indicate approval of the treatment plan as stated above.  Please sign and either send electronically or make a copy of this report for your files and return this physician signed original.   Please mark one 1.__approve of plan  2. ___approve of plan with the following conditions.   ______________________________                                                          _____________________ Physician Signature                                                                                                             Date

## 2014-03-17 ENCOUNTER — Ambulatory Visit (HOSPITAL_COMMUNITY)
Admission: RE | Admit: 2014-03-17 | Discharge: 2014-03-17 | Disposition: A | Discharge status: Home / Self Care | Payer: 59 | Source: Ambulatory Visit | Attending: Orthopaedic Surgery | Admitting: Orthopaedic Surgery

## 2014-03-17 NOTE — Progress Notes (Signed)
Physical Therapy Treatment Patient Details  Name: Nicholas Delacruz MRN: [id number] Date of Birth: 30-Jun-1966  Today's Date: 03/17/2014 Time: 1937-9024 PT Time Calculation (min): 38 min Charges: Therex x 34'  Visit#: 2 of 12  Re-eval: 04/12/14  Authorization: [employer]HC    Subjective: Symptoms/Limitations Symptoms: Pt states that left calf is hurting a lot today. Pain Assessment Currently in Pain?: Yes Pain Score: 7  Pain Location: Calf Pain Orientation: Left   Exercise/Treatments Stretches Gastroc Stretch: 2 reps;30 seconds Aerobic Stationary Bike: NuStep x 10' L3 hills #3 Seated Long Arc Quad: 10 reps Supine Quad Sets: 10 reps Straight Leg Raises: 10 reps Sidelying Hip ABduction: 10 reps Prone  Hamstring Curl: 10 reps Hip Extension: 10 reps    Physical Therapy Assessment and Plan PT Assessment and Plan Clinical Impression Statement: Pt is limited by calf pain this session. Pt presents with negative Homan's sign. Pt completes therex with minimal difficulty after initial cueing and demo. Began standing gastroc stretch with minimal difficulty. Gentle MFR completes to right medial calf with reports of decrease pain after completion.  Pt will benefit from skilled therapeutic intervention in order to improve on the following deficits: Difficulty walking;Decreased strength;Decreased range of motion;Pain Rehab Potential: Good PT Frequency: Min 3X/week PT Duration: 4 weeks PT Treatment/Interventions: Therapeutic activities;Therapeutic exercise;Modalities;Manual techniques PT Plan: Continue to progress LE strength/stability and decrease pain per PT POC.     Problem List Patient Active Problem List   Diagnosis Date Noted  . Weakness of left leg 03/13/2014  . Difficulty in walking(719.7) 03/13/2014  . Stiffness of joint, not elsewhere classified, lower leg 03/13/2014  . Arthritis of knee, left 01/21/2014  . Arthritis of knee 11/13/2012  . Cervicalgia 11/08/2011  . Rotator  cuff syndrome of left shoulder 11/08/2011  . S/P complete repair of rotator cuff 07/21/2011  . RHEUMATOID ARTHRITIS 02/01/2011  . RUPTURE ROTATOR CUFF 02/01/2011  . SUPERIOR GLENOID LABRUM TEAR 01/12/2011  . SHOULDER PAIN 12/07/2010  . IMPINGEMENT SYNDROME 12/07/2010  . CONTUSION, LEFT KNEE 03/16/2010  . KNEE, ARTHRITIS, DEGEN./OSTEO 09/28/2009  . JOINT EFFUSION, LEFT KNEE 08/25/2009  . KNEE PAIN 05/14/2009  . TEAR MEDIAL MENISCUS 05/14/2009  . HIGH BLOOD PRESSURE 05/13/2009    PT - End of Session Activity Tolerance: Patient tolerated treatment well General Behavior During Therapy: [facility] for tasks assessed/performed  Rachelle Hora, PTA  03/17/2014, 4:43 PM

## 2014-03-19 ENCOUNTER — Ambulatory Visit (HOSPITAL_COMMUNITY)
Admission: RE | Admit: 2014-03-19 | Discharge: 2014-03-19 | Disposition: A | Discharge status: Home / Self Care | Payer: 59 | Source: Ambulatory Visit | Attending: Orthopaedic Surgery | Admitting: Orthopaedic Surgery

## 2014-03-19 NOTE — Progress Notes (Signed)
Physical Therapy Treatment Patient Details  Name: Nicholas Delacruz MRN: [id number] Date of Birth: [birth year]  Today's Date: 03/19/2014 Time: 0539-7673 PT Time Calculation (min): 36 min  Visit#: 3 of 12  Re-eval: 04/12/14 Authorization: [employer]  Charges:  therex 35  Subjective: Symptoms/Limitations Symptoms: Pt states his pain is much better in his Lt calf today, 5/10.  States was elevated approx 12 hours after last visit but then decreased.   Pain Assessment Currently in Pain?: Yes Pain Score: 5  Pain Orientation: Left   Exercise/Treatments Stretches Gastroc Stretch: 3 reps;30 seconds Aerobic Stationary Bike: NuStep x 10' L3 hills #3 Standing Heel Raises: 10 reps;Limitations Heel Raises Limitations: toeraises 10 reps Knee Flexion: 10 reps;Left Lateral Step Up: Left;10 reps;Step Height: 4";Hand Hold: 1 Rocker Board: Limitations Rocker Board Limitations: A/P 10 reps unable to complete R/L secondary to pain Prone  Hamstring Curl: 10 reps;Limitations Hamstring Curl Limitations: 5# Hip Extension: 10 reps;Limitations Hip Extension Limitations: 5#    Physical Therapy Assessment and Plan PT Assessment and Plan Clinical Impression Statement: Progressed to standing exercises today.  No c/o pain except with R/L rockerboard.  Overall decreased pain in Lt calf today.  Pt requested to hold manual today but would like on Friday if continues to have pain. PT Treatment/Interventions: Therapeutic activities;Therapeutic exercise;Modalities;Manual techniques PT Plan: continue to progress LE strength while decreasing pain.  Resume manual techniques next visit if pain unchanged or higher.     Problem List Patient Active Problem List   Diagnosis Date Noted  . Weakness of left leg 03/13/2014  . Difficulty in walking(719.7) 03/13/2014  . Stiffness of joint, not elsewhere classified, lower leg 03/13/2014  . Arthritis of knee, left 01/21/2014  . Arthritis of knee 11/13/2012  . Cervicalgia  11/08/2011  . Rotator cuff syndrome of left shoulder 11/08/2011  . S/P complete repair of rotator cuff 07/21/2011  . RHEUMATOID ARTHRITIS 02/01/2011  . RUPTURE ROTATOR CUFF 02/01/2011  . SUPERIOR GLENOID LABRUM TEAR 01/12/2011  . SHOULDER PAIN 12/07/2010  . IMPINGEMENT SYNDROME 12/07/2010  . CONTUSION, LEFT KNEE 03/16/2010  . KNEE, ARTHRITIS, DEGEN./OSTEO 09/28/2009  . JOINT EFFUSION, LEFT KNEE 08/25/2009  . KNEE PAIN 05/14/2009  . TEAR MEDIAL MENISCUS 05/14/2009  . HIGH BLOOD PRESSURE 05/13/2009    PT - End of Session Activity Tolerance: Patient tolerated treatment well General Behavior During Therapy: [facility] for tasks assessed/performed   Teena Irani, PTA/CLT 03/19/2014, 4:45 PM

## 2014-03-24 ENCOUNTER — Ambulatory Visit (HOSPITAL_COMMUNITY): Payer: 59 | Admitting: Physical Therapy

## 2014-03-25 ENCOUNTER — Other Ambulatory Visit (HOSPITAL_COMMUNITY): Payer: Self-pay | Admitting: Orthopaedic Surgery

## 2014-03-25 ENCOUNTER — Ambulatory Visit (HOSPITAL_COMMUNITY)
Admission: RE | Admit: 2014-03-25 | Discharge: 2014-03-25 | Disposition: A | Discharge status: Home / Self Care | Payer: 59 | Source: Ambulatory Visit | Attending: Orthopaedic Surgery | Admitting: Orthopaedic Surgery

## 2014-03-25 ENCOUNTER — Ambulatory Visit (HOSPITAL_COMMUNITY): Payer: 59 | Admitting: *Deleted

## 2014-03-25 DIAGNOSIS — R52 Pain, unspecified: Secondary | ICD-10-CM

## 2014-03-25 DIAGNOSIS — R9389 Abnormal findings on diagnostic imaging of other specified body structures: Secondary | ICD-10-CM | POA: Insufficient documentation

## 2014-03-25 DIAGNOSIS — R609 Edema, unspecified: Secondary | ICD-10-CM | POA: Insufficient documentation

## 2014-03-25 DIAGNOSIS — M25569 Pain in unspecified knee: Secondary | ICD-10-CM | POA: Insufficient documentation

## 2014-03-25 DIAGNOSIS — M79609 Pain in unspecified limb: Secondary | ICD-10-CM | POA: Insufficient documentation

## 2014-03-26 ENCOUNTER — Ambulatory Visit (HOSPITAL_COMMUNITY): Payer: 59 | Admitting: *Deleted

## 2014-03-31 ENCOUNTER — Ambulatory Visit (HOSPITAL_COMMUNITY): Payer: 59 | Admitting: Physical Therapy

## 2014-04-02 ENCOUNTER — Ambulatory Visit (HOSPITAL_COMMUNITY): Payer: 59 | Admitting: Physical Therapy

## 2014-04-29 ENCOUNTER — Telehealth: Payer: Self-pay | Admitting: Orthopedic Surgery

## 2014-04-29 NOTE — Telephone Encounter (Signed)
Patient called to relay that he is ready to decide about surgery on left knee, per last office visit 01/21/14.  New appointment needed for this prior to schedule of surgery?  Please advise patient.  Ph# [phone]

## 2014-04-30 NOTE — Telephone Encounter (Signed)
Patient scheduled for an appointment to discuss surgery.

## 2014-05-06 ENCOUNTER — Ambulatory Visit (INDEPENDENT_AMBULATORY_CARE_PROVIDER_SITE_OTHER): Payer: 59 | Admitting: Orthopedic Surgery

## 2014-05-06 ENCOUNTER — Encounter: Payer: Self-pay | Admitting: Orthopedic Surgery

## 2014-05-06 VITALS — BP 169/97 | Ht 73.0 in | Wt 324.0 lb

## 2014-05-06 DIAGNOSIS — M1710 Unilateral primary osteoarthritis, unspecified knee: Secondary | ICD-10-CM

## 2014-05-06 DIAGNOSIS — IMO0002 Reserved for concepts with insufficient information to code with codable children: Secondary | ICD-10-CM

## 2014-05-06 DIAGNOSIS — M25569 Pain in unspecified knee: Secondary | ICD-10-CM

## 2014-05-06 DIAGNOSIS — M1712 Unilateral primary osteoarthritis, left knee: Secondary | ICD-10-CM

## 2014-05-06 MED ORDER — HYDROCODONE-ACETAMINOPHEN 7.5-325 MG PO TABS: 1.0000 | ORAL_TABLET | Freq: Four times a day (QID) | ORAL | Status: DC | PRN

## 2014-05-06 NOTE — Patient Instructions (Signed)
OOW for 1 month 04/28/14 - 06/06/14

## 2014-05-06 NOTE — Progress Notes (Signed)
Patient ID: ARDON FRANKLIN, male   DOB: 1966-07-27, 48 y.o.   MRN: [id number]   Chief Complaint  Patient presents with  . Follow-up    Recheck left knee.    Continued pain left knee. The last I saw him he had an MRI has arthritis I recommended debridement because is 47 and overweight. He went for second opinion with Dr. Joni Fears. He says Dr. Durward Fortes agreed that he is too young recommended losing weight and was not in favor in debridement per se.  He is also pulled a muscle in his calf last month. His right knee is starting to hurt him he had 5 surgeries including microfracture on that leg  After discussing this with him at length including joint, with other options we have decided to give him a month out of work to rest his knee he'll take Norco 7.5 every 6 when necessary for pain he'll followup in a month and we'll decide at that time if he can manage with debridement alone.  Time spent 15 minutes

## 2014-05-07 ENCOUNTER — Telehealth: Payer: Self-pay | Admitting: Orthopedic Surgery

## 2014-05-13 ENCOUNTER — Telehealth: Payer: Self-pay | Admitting: Orthopedic Surgery

## 2014-05-13 NOTE — Telephone Encounter (Signed)
Sent as per request.  Refer to separate note "outgoing fax" 05/13/14.

## 2014-05-13 NOTE — Telephone Encounter (Signed)
Work note and out of work 1-sheet form faxed to [employer], represents employer, Personal assistant, to attention April Holding RN, Nurse case manager, fax# [phone];  Ph # [phone]; authorization on file.

## 2014-05-15 NOTE — Progress Notes (Signed)
  Patient Details  Name: Nicholas Delacruz MRN: [id number] Date of Birth: April 17, 1966  Today's Date: 05/15/2014 Pt has not returned to the clinic since 03/19/2014; will discharge pt.   Leeroy Cha 05/15/2014, 4:35 PM

## 2014-05-15 NOTE — Addendum Note (Signed)
Encounter addended by: Leeroy Cha, PT on: 05/15/2014  4:35 PM<BR>     Documentation filed: Notes Section, Episodes

## 2014-06-10 ENCOUNTER — Encounter: Payer: Self-pay | Admitting: Orthopedic Surgery

## 2014-06-10 ENCOUNTER — Ambulatory Visit (INDEPENDENT_AMBULATORY_CARE_PROVIDER_SITE_OTHER): Payer: 59

## 2014-06-10 ENCOUNTER — Ambulatory Visit (INDEPENDENT_AMBULATORY_CARE_PROVIDER_SITE_OTHER): Payer: 59 | Admitting: Orthopedic Surgery

## 2014-06-10 VITALS — BP 149/91 | Ht 73.0 in | Wt 324.0 lb

## 2014-06-10 DIAGNOSIS — M25569 Pain in unspecified knee: Secondary | ICD-10-CM

## 2014-06-10 DIAGNOSIS — M25562 Pain in left knee: Secondary | ICD-10-CM

## 2014-06-10 MED ORDER — HYDROCODONE-ACETAMINOPHEN 7.5-325 MG PO TABS: 1.0000 | ORAL_TABLET | Freq: Four times a day (QID) | ORAL | Status: DC | PRN

## 2014-06-10 NOTE — Patient Instructions (Addendum)
Left knee arthroscopy June 25, 2014 Medial menisectomy  29881  (on 04/28/2014) Nicholas Delacruz had same symptoms and we discussed trying to do conservative treatment; he had mri in Jan 2015 which showed oa of the knee)  Meds ordered this encounter  Medications  . HYDROcodone-acetaminophen (NORCO) 7.5-325 MG per tablet    Sig: Take 1 tablet by mouth every 6 (six) hours as needed.    Dispense:  56 tablet    Refill:  0

## 2014-06-10 NOTE — Progress Notes (Signed)
Patient ID: Nicholas Delacruz, male   DOB: October 31, 1966, 48 y.o.   MRN: [id number]  Chief Complaint  Patient presents with  . Follow-up    one month recheck left knee    As discussed on his last visit based on his MRI from January 2015 the patient has persistent pain and arthritis in his left knee status post previous arthroscopy. He has not improved despite attempts at weight loss, anti-inflammatory medications. Rest activity modification and even Norco 7.5 mg. His knee is getting worse and it is affecting her right knee because is depending on the right knee as he unloads the left knee  He said there was a problem with his short-term disability because there was not a previous knee examination. This is quite unusual. The patient has a long history at this office I note his history very well and no both of his knees very well. His MRI shows that he has arthritis his symptoms warranted further attempts at nonoperative treatment although we knew that he needed either an arthroscopy to surgery as I discussed last time or total knee replacement. However, at age 24 it would be better if we can avoid knee replacement at that age  In any event today he comes in for reevaluation still complaining of pain popping catching locking and giving way of the left knee  System review is gained weight he can exercise, he denies chest pain or shortness of breath.  Vital signs: BP 149/91  Ht 6\' 1"  (1.854 m)  Wt 324 lb (146.965 kg)  BMI 42.76 kg/m2   General the patient is well-developed and well-nourished grooming and hygiene are normal Oriented x3 Mood and affect normal Ambulation he favors his left lower extremity with an altered gait.  The right knee only flexes to 95 he has medial and lateral joint line tenderness as well as patellar tenderness patellar tendon tenderness. His knee is stable. He has no weakness in the knee muscles. Scans intact. He has good distal pulse no lymphadenopathy normal sensation and  normal reflexes at the knee and ankle which are either equal to the opposite knee and ankle with negative straight leg raises bilaterally normal range of motion of both hips  As far as the left knee goes he has medial joint line tenderness which is significant and severe. His knee flexion on that side measured by goniometer is 115 he does have an effusion on this knee. Ligaments remain stable. Manual muscle testing reveals 5 out of 5 strength in the quadriceps. The skin is clean dry and is intact has good pulses distally and normal sensation by pinprick and soft touch  Encounter Diagnoses  Name Primary?  . Left knee pain Yes  . Knee pain    secondary diagnosis osteoarthritis left knee  X-rays show medial compartment gonarthrosis moderate to severe  Again we have an MRI from January of 2015 which shows 3 compartment degenerative arthritis  At this point as we discussed again today we're going to try to avoid a knee replacement surgery. Instead we will try to scope the knee and try to clean it up and remove any cartilaginous debris chondral flap tears loose bodies which may be preventing his knee from good function  Arthroscopy left knee extensive debridement

## 2014-06-11 ENCOUNTER — Other Ambulatory Visit: Payer: Self-pay | Admitting: *Deleted

## 2014-06-11 ENCOUNTER — Encounter (HOSPITAL_COMMUNITY): Payer: Self-pay | Admitting: Pharmacy Technician

## 2014-06-19 ENCOUNTER — Encounter (HOSPITAL_COMMUNITY): Admission: RE | Admit: 2014-06-19 | Payer: 59 | Source: Ambulatory Visit

## 2014-06-19 ENCOUNTER — Telehealth: Payer: Self-pay | Admitting: Orthopedic Surgery

## 2014-06-19 NOTE — Telephone Encounter (Signed)
Regarding out-patient surgery scheduled 06/25/14 at [facility], [place], [phone], contacted [place], ph [phone]; per Neita Goodnight, no pre-authorization required for in-network providers.  Her name and today's date 06/19/14, 1:46p.m., for reference.

## 2014-06-20 ENCOUNTER — Encounter (HOSPITAL_COMMUNITY): Payer: Self-pay | Admitting: Pharmacy Technician

## 2014-06-20 ENCOUNTER — Encounter (HOSPITAL_COMMUNITY)
Admission: RE | Admit: 2014-06-20 | Discharge: 2014-06-20 | Disposition: A | Discharge status: Home / Self Care | Payer: 59 | Source: Ambulatory Visit | Attending: Orthopedic Surgery | Admitting: Orthopedic Surgery

## 2014-06-20 ENCOUNTER — Other Ambulatory Visit: Payer: Self-pay

## 2014-06-20 ENCOUNTER — Encounter (HOSPITAL_COMMUNITY): Payer: Self-pay

## 2014-06-20 DIAGNOSIS — Z01818 Encounter for other preprocedural examination: Secondary | ICD-10-CM | POA: Insufficient documentation

## 2014-06-20 DIAGNOSIS — Z01812 Encounter for preprocedural laboratory examination: Secondary | ICD-10-CM | POA: Insufficient documentation

## 2014-06-20 DIAGNOSIS — Z01810 Encounter for preprocedural cardiovascular examination: Secondary | ICD-10-CM | POA: Insufficient documentation

## 2014-06-20 HISTORY — DX: Gastro-esophageal reflux disease without esophagitis: K21.9

## 2014-06-20 HISTORY — DX: Other specified postprocedural states: Z98.890

## 2014-06-20 HISTORY — DX: Other specified postprocedural states: R11.2

## 2014-06-20 LAB — BASIC METABOLIC PANEL
BUN: 11 mg/dL (ref 6–23)
CO2: 26 mEq/L (ref 19–32)
Calcium: 8.9 mg/dL (ref 8.4–10.5)
Chloride: 104 mEq/L (ref 96–112)
Creatinine, Ser: 0.87 mg/dL (ref 0.50–1.35)
GFR calc Af Amer: >90 mL/min (ref >90)
GFR calc non Af Amer: >90 mL/min (ref >90)
Glucose, Bld: 127 mg/dL — ABNORMAL HIGH (ref 70–99)
Potassium: 4.5 mEq/L (ref 3.7–5.3)
Sodium: 140 mEq/L (ref 137–147)

## 2014-06-20 LAB — HEMOGLOBIN AND HEMATOCRIT, BLOOD
HCT: 38.8 % — ABNORMAL LOW (ref 39.0–52.0)
Hemoglobin: 12.8 g/dL — ABNORMAL LOW (ref 13.0–17.0)

## 2014-06-20 NOTE — Pre-Procedure Instructions (Signed)
Patient given onformation to sign up fpr my chart at home.

## 2014-06-20 NOTE — Patient Instructions (Addendum)
Nicholas Delacruz  06/20/2014   Your procedure is scheduled on:  06/25/2014  Report to [facility] at  1000  AM.  Call this number if you have problems the morning of surgery: [phone]   Remember:   Do not eat food or drink liquids after midnight.   Take these medicines the morning of surgery with A SIP OF WATER: amlodipine, labetolol, hydrocodone   Do not wear jewelry, make-up or nail polish.  Do not wear lotions, powders, or perfumes.   Do not shave 48 hours prior to surgery. Men may shave face and neck.  Do not bring valuables to the hospital.  [facility] is not responsible  for any belongings or valuables.               Contacts, dentures or bridgework may not be worn into surgery.  Leave suitcase in the car. After surgery it may be brought to your room.  For patients admitted to the hospital, discharge time is determined by your treatment team.               Patients discharged the day of surgery will not be allowed to drive home.  Name and phone number of your driver: family  Special Instructions: Shower using CHG 2 nights before surgery and the night before surgery.  If you shower the day of surgery use CHG.  Use special wash - you have one bottle of CHG for all showers.  You should use approximately 1/3 of the bottle for each shower.   Please read over the following fact sheets that you were given: Pain Booklet, Coughing and Deep Breathing, Surgical Site Infection Prevention, Anesthesia Post-op Instructions and Care and Recovery After Surgery Arthroscopic Procedure, Knee An arthroscopic procedure can find what is wrong with your knee. PROCEDURE Arthroscopy is a surgical technique that allows your orthopedic surgeon to diagnose and treat your knee injury with accuracy. They will look into your knee through a small instrument. This is almost like a small (pencil sized) telescope. Because arthroscopy affects your knee less than open knee surgery, you can anticipate a more  rapid recovery. Taking an active role by following your caregiver's instructions will help with rapid and complete recovery. Use crutches, rest, elevation, ice, and knee exercises as instructed. The length of recovery depends on various factors including type of injury, age, physical condition, medical conditions, and your rehabilitation. Your knee is the joint between the large bones (femur and tibia) in your leg. Cartilage covers these bone ends which are smooth and slippery and allow your knee to bend and move smoothly. Two menisci, thick, semi-lunar shaped pads of cartilage which form a rim inside the joint, help absorb shock and stabilize your knee. Ligaments bind the bones together and support your knee joint. Muscles move the joint, help support your knee, and take stress off the joint itself. Because of this all programs and physical therapy to rehabilitate an injured or repaired knee require rebuilding and strengthening your muscles. AFTER THE PROCEDURE  After the procedure, you will be moved to a recovery area until most of the effects of the medication have worn off. Your caregiver will discuss the test results with you.  Only take over-the-counter or prescription medicines for pain, discomfort, or fever as directed by your caregiver. SEEK MEDICAL CARE IF:   You have increased bleeding from your wounds.  You see redness, swelling, or have increasing pain in your wounds.  You have pus coming  from your wound.  You have an oral temperature above 102 F (38.9 C).  You notice a bad smell coming from the wound or dressing.  You have severe pain with any motion of your knee. SEEK IMMEDIATE MEDICAL CARE IF:   You develop a rash.  You have difficulty breathing.  You have any allergic problems. Document Released: 12/09/2000 Document Revised: 03/05/2012 Document Reviewed: 07/02/2008 [facility] Patient Information 2015 [place], [place]. This information is not intended to replace advice  given to you by your health care provider. Make sure you discuss any questions you have with your health care provider. PATIENT INSTRUCTIONS POST-ANESTHESIA  IMMEDIATELY FOLLOWING SURGERY:  Do not drive or operate machinery for the first twenty four hours after surgery.  Do not make any important decisions for twenty four hours after surgery or while taking narcotic pain medications or sedatives.  If you develop intractable nausea and vomiting or a severe headache please notify your doctor immediately.  FOLLOW-UP:  Please make an appointment with your surgeon as instructed. You do not need to follow up with anesthesia unless specifically instructed to do so.  WOUND CARE INSTRUCTIONS (if applicable):  Keep a dry clean dressing on the anesthesia/puncture wound site if there is drainage.  Once the wound has quit draining you may leave it open to air.  Generally you should leave the bandage intact for twenty four hours unless there is drainage.  If the epidural site drains for more than 36-48 hours please call the anesthesia department.  QUESTIONS?:  Please feel free to call your physician or the hospital operator if you have any questions, and they will be happy to assist you.

## 2014-06-23 NOTE — H&P (Signed)
  Chief Complaint   Patient presents with   .  Follow-up       one month recheck left knee     As discussed on his last visit based on his MRI from January 2015 the patient has persistent pain and arthritis in his left knee status post previous arthroscopy. He has not improved despite attempts at weight loss, anti-inflammatory medications. Rest activity modification and even Norco 7.5 mg. His knee is getting worse and it is affecting her right knee because is depending on the right knee as he unloads the left knee  He said there was a problem with his short-term disability because there was not a previous knee examination. This is quite unusual. The patient has a long history at this office I note his history very well and no both of his knees very well. His MRI shows that he has arthritis his symptoms warranted further attempts at nonoperative treatment although we knew that he needed either an arthroscopy to surgery as I discussed last time or total knee replacement. However, at age 48 it would be better if we can avoid knee replacement at that age  In any event today he comes in for reevaluation still complaining of pain popping catching locking and giving way of the left knee  System review is gained weight he can exercise, he denies chest pain or shortness of breath.  Vital signs: BP 149/91  Ht 6\' 1"  (1.854 m)  Wt 324 lb (146.965 kg)  BMI 42.76 kg/m2   General the patient is well-developed and well-nourished grooming and hygiene are normal Oriented x3 Mood and affect normal Ambulation he favors his left lower extremity with an altered gait.  The right knee only flexes to 95 he has medial and lateral joint line tenderness as well as patellar tenderness patellar tendon tenderness. His knee is stable. He has no weakness in the knee muscles. Scans intact. He has good distal pulse no lymphadenopathy normal sensation and normal reflexes at the knee and ankle which are either equal to the  opposite knee and ankle with negative straight leg raises bilaterally normal range of motion of both hips  As far as the left knee goes he has medial joint line tenderness which is significant and severe. His knee flexion on that side measured by goniometer is 115 he does have an effusion on this knee. Ligaments remain stable. Manual muscle testing reveals 5 out of 5 strength in the quadriceps. The skin is clean dry and is intact has good pulses distally and normal sensation by pinprick and soft touch    Encounter Diagnoses   Name  Primary?   .  Left knee pain  Yes   .  Knee pain      diagnosis osteoarthritis left knee  X-rays show medial compartment gonarthrosis moderate to severe  Again we have an MRI from January of 2015 which shows 3 compartment degenerative arthritis  At this point as we discussed again today we're going to try to avoid a knee replacement surgery. Instead we will try to scope the knee and try to clean it up and remove any cartilaginous debris chondral flap tears loose bodies which may be preventing his knee from good function  Arthroscopy left knee extensive debridement

## 2014-06-25 ENCOUNTER — Encounter (HOSPITAL_COMMUNITY)
Admission: RE | Disposition: A | Discharge status: Home / Self Care | Payer: Self-pay | Source: Ambulatory Visit | Attending: Orthopedic Surgery

## 2014-06-25 ENCOUNTER — Encounter (HOSPITAL_COMMUNITY): Payer: 59 | Admitting: Anesthesiology

## 2014-06-25 ENCOUNTER — Encounter (HOSPITAL_COMMUNITY): Payer: Self-pay | Admitting: *Deleted

## 2014-06-25 ENCOUNTER — Ambulatory Visit (HOSPITAL_COMMUNITY)
Admission: RE | Admit: 2014-06-25 | Discharge: 2014-06-25 | Disposition: A | Discharge status: Home / Self Care | Payer: 59 | Source: Ambulatory Visit | Attending: Orthopedic Surgery | Admitting: Orthopedic Surgery

## 2014-06-25 ENCOUNTER — Ambulatory Visit (HOSPITAL_COMMUNITY): Payer: 59 | Admitting: Anesthesiology

## 2014-06-25 DIAGNOSIS — K219 Gastro-esophageal reflux disease without esophagitis: Secondary | ICD-10-CM | POA: Insufficient documentation

## 2014-06-25 DIAGNOSIS — M659 Unspecified synovitis and tenosynovitis, unspecified site: Secondary | ICD-10-CM | POA: Insufficient documentation

## 2014-06-25 DIAGNOSIS — M1710 Unilateral primary osteoarthritis, unspecified knee: Secondary | ICD-10-CM

## 2014-06-25 DIAGNOSIS — X58XXXA Exposure to other specified factors, initial encounter: Secondary | ICD-10-CM | POA: Insufficient documentation

## 2014-06-25 DIAGNOSIS — Z6841 Body Mass Index (BMI) 40.0 and over, adult: Secondary | ICD-10-CM | POA: Insufficient documentation

## 2014-06-25 DIAGNOSIS — IMO0002 Reserved for concepts with insufficient information to code with codable children: Secondary | ICD-10-CM

## 2014-06-25 DIAGNOSIS — I10 Essential (primary) hypertension: Secondary | ICD-10-CM | POA: Insufficient documentation

## 2014-06-25 HISTORY — PX: KNEE ARTHROSCOPY WITH MEDIAL MENISECTOMY: SHX5651

## 2014-06-25 SURGERY — ARTHROSCOPY, KNEE, WITH MEDIAL MENISCECTOMY
Anesthesia: General | Site: Knee | Laterality: Left

## 2014-06-25 MED ORDER — CEFAZOLIN SODIUM-DEXTROSE 2-3 GM-% IV SOLR
INTRAVENOUS | Status: AC
  Filled 2014-06-25: qty 50

## 2014-06-25 MED ORDER — NEOSTIGMINE METHYLSULFATE 10 MG/10ML IV SOLN
INTRAVENOUS | Status: DC | PRN
  Administered 2014-06-25: 2 mg via INTRAVENOUS

## 2014-06-25 MED ORDER — OXYCODONE HCL 5 MG PO TABS
5.0000 mg | ORAL_TABLET | Freq: Once | ORAL | Status: AC
  Administered 2014-06-25: 5 mg via ORAL

## 2014-06-25 MED ORDER — PROPOFOL 10 MG/ML IV BOLUS
INTRAVENOUS | Status: DC | PRN
  Administered 2014-06-25: 250 mg via INTRAVENOUS
  Administered 2014-06-25: 100 mg via INTRAVENOUS

## 2014-06-25 MED ORDER — CEFAZOLIN SODIUM 1-5 GM-% IV SOLN
INTRAVENOUS | Status: AC
Start: 2014-06-25 — End: 2014-06-25
  Filled 2014-06-25: qty 50

## 2014-06-25 MED ORDER — KETOROLAC TROMETHAMINE 30 MG/ML IJ SOLN
30.0000 mg | Freq: Once | INTRAMUSCULAR | Status: AC
  Administered 2014-06-25: 30 mg via INTRAVENOUS

## 2014-06-25 MED ORDER — LIDOCAINE HCL (CARDIAC) 20 MG/ML IV SOLN
INTRAVENOUS | Status: DC | PRN
  Administered 2014-06-25: 50 mg via INTRAVENOUS

## 2014-06-25 MED ORDER — ONDANSETRON HCL 4 MG/2ML IJ SOLN
INTRAMUSCULAR | Status: AC
  Filled 2014-06-25: qty 2

## 2014-06-25 MED ORDER — DEXAMETHASONE SODIUM PHOSPHATE 4 MG/ML IJ SOLN
INTRAMUSCULAR | Status: AC
  Filled 2014-06-25: qty 1

## 2014-06-25 MED ORDER — PROPOFOL 10 MG/ML IV BOLUS
INTRAVENOUS | Status: AC
  Filled 2014-06-25: qty 20

## 2014-06-25 MED ORDER — BUPIVACAINE-EPINEPHRINE (PF) 0.5% -1:200000 IJ SOLN
INTRAMUSCULAR | Status: AC
  Filled 2014-06-25: qty 30

## 2014-06-25 MED ORDER — GLYCOPYRROLATE 0.2 MG/ML IJ SOLN
0.2000 mg | Freq: Once | INTRAMUSCULAR | Status: AC
  Administered 2014-06-25: 0.2 mg via INTRAVENOUS

## 2014-06-25 MED ORDER — GLYCOPYRROLATE 0.2 MG/ML IJ SOLN
INTRAMUSCULAR | Status: DC | PRN
  Administered 2014-06-25: 0.2 mg via INTRAVENOUS

## 2014-06-25 MED ORDER — KETOROLAC TROMETHAMINE 30 MG/ML IJ SOLN
INTRAMUSCULAR | Status: AC
  Filled 2014-06-25: qty 1

## 2014-06-25 MED ORDER — CHLORHEXIDINE GLUCONATE 4 % EX LIQD
60.0000 mL | Freq: Once | CUTANEOUS | Status: DC
Start: 2014-06-25 — End: 2014-06-25

## 2014-06-25 MED ORDER — MIDAZOLAM HCL 2 MG/2ML IJ SOLN
INTRAMUSCULAR | Status: AC
  Filled 2014-06-25: qty 2

## 2014-06-25 MED ORDER — CEFAZOLIN SODIUM-DEXTROSE 2-3 GM-% IV SOLR
2.0000 g | Freq: Once | INTRAVENOUS | Status: AC
  Administered 2014-06-25: 2 g via INTRAVENOUS

## 2014-06-25 MED ORDER — FENTANYL CITRATE 0.05 MG/ML IJ SOLN
INTRAMUSCULAR | Status: AC
  Filled 2014-06-25: qty 2

## 2014-06-25 MED ORDER — OXYCODONE-ACETAMINOPHEN 5-325 MG PO TABS: 1.0000 | ORAL_TABLET | ORAL | Status: DC | PRN

## 2014-06-25 MED ORDER — FENTANYL CITRATE 0.05 MG/ML IJ SOLN: 25.0000 ug | INTRAMUSCULAR | Status: DC | PRN

## 2014-06-25 MED ORDER — DEXTROSE 5 % IV SOLN: 3.0000 g | INTRAVENOUS | Status: DC

## 2014-06-25 MED ORDER — CEFAZOLIN SODIUM 1-5 GM-% IV SOLN
1.0000 g | Freq: Once | INTRAVENOUS | Status: AC
  Administered 2014-06-25: 1 g via INTRAVENOUS

## 2014-06-25 MED ORDER — FENTANYL CITRATE 0.05 MG/ML IJ SOLN
INTRAMUSCULAR | Status: AC
  Filled 2014-06-25: qty 5

## 2014-06-25 MED ORDER — LACTATED RINGERS IV SOLN
INTRAVENOUS | Status: DC
  Administered 2014-06-25: 11:00:00 via INTRAVENOUS

## 2014-06-25 MED ORDER — PROMETHAZINE HCL 12.5 MG PO TABS: 12.5000 mg | ORAL_TABLET | Freq: Four times a day (QID) | ORAL | Status: DC | PRN

## 2014-06-25 MED ORDER — DEXAMETHASONE SODIUM PHOSPHATE 4 MG/ML IJ SOLN
4.0000 mg | Freq: Once | INTRAMUSCULAR | Status: AC
  Administered 2014-06-25: 4 mg via INTRAVENOUS

## 2014-06-25 MED ORDER — BUPIVACAINE-EPINEPHRINE (PF) 0.5% -1:200000 IJ SOLN
INTRAMUSCULAR | Status: DC | PRN
  Administered 2014-06-25: 60 mL

## 2014-06-25 MED ORDER — SCOPOLAMINE 1 MG/3DAYS TD PT72
1.0000 | MEDICATED_PATCH | Freq: Once | TRANSDERMAL | Status: DC
  Administered 2014-06-25: 1.5 mg via TRANSDERMAL

## 2014-06-25 MED ORDER — ONDANSETRON HCL 4 MG/2ML IJ SOLN
4.0000 mg | Freq: Once | INTRAMUSCULAR | Status: AC
  Administered 2014-06-25: 4 mg via INTRAVENOUS

## 2014-06-25 MED ORDER — SODIUM CHLORIDE 0.9 % IR SOLN
Status: DC | PRN
  Administered 2014-06-25: 1000 mL

## 2014-06-25 MED ORDER — MIDAZOLAM HCL 5 MG/5ML IJ SOLN
INTRAMUSCULAR | Status: DC | PRN
  Administered 2014-06-25: 2 mg via INTRAVENOUS

## 2014-06-25 MED ORDER — SODIUM CHLORIDE 0.9 % IR SOLN
Status: DC | PRN
  Administered 2014-06-25 (×3)

## 2014-06-25 MED ORDER — MIDAZOLAM HCL 2 MG/2ML IJ SOLN
1.0000 mg | INTRAMUSCULAR | Status: DC | PRN
  Administered 2014-06-25: 2 mg via INTRAVENOUS

## 2014-06-25 MED ORDER — LACTATED RINGERS IV SOLN
INTRAVENOUS | Status: DC | PRN
  Administered 2014-06-25 (×2): via INTRAVENOUS

## 2014-06-25 MED ORDER — SUCCINYLCHOLINE CHLORIDE 20 MG/ML IJ SOLN
INTRAMUSCULAR | Status: AC
  Filled 2014-06-25: qty 1

## 2014-06-25 MED ORDER — LIDOCAINE HCL (PF) 1 % IJ SOLN
INTRAMUSCULAR | Status: AC
  Filled 2014-06-25: qty 5

## 2014-06-25 MED ORDER — ROCURONIUM BROMIDE 50 MG/5ML IV SOLN
INTRAVENOUS | Status: AC
  Filled 2014-06-25: qty 1

## 2014-06-25 MED ORDER — ONDANSETRON HCL 4 MG/2ML IJ SOLN: 4.0000 mg | Freq: Once | INTRAMUSCULAR | Status: DC | PRN

## 2014-06-25 MED ORDER — ROCURONIUM BROMIDE 100 MG/10ML IV SOLN
INTRAVENOUS | Status: DC | PRN
  Administered 2014-06-25: 10 mg via INTRAVENOUS

## 2014-06-25 MED ORDER — SUCCINYLCHOLINE CHLORIDE 20 MG/ML IJ SOLN
INTRAMUSCULAR | Status: DC | PRN
  Administered 2014-06-25: 160 mg via INTRAVENOUS

## 2014-06-25 MED ORDER — SCOPOLAMINE 1 MG/3DAYS TD PT72
MEDICATED_PATCH | TRANSDERMAL | Status: AC
  Filled 2014-06-25: qty 1

## 2014-06-25 MED ORDER — EPINEPHRINE HCL 1 MG/ML IJ SOLN
INTRAMUSCULAR | Status: AC
  Filled 2014-06-25: qty 5

## 2014-06-25 MED ORDER — OXYCODONE HCL 5 MG PO TABS
ORAL_TABLET | ORAL | Status: AC
  Filled 2014-06-25: qty 1

## 2014-06-25 MED ORDER — GLYCOPYRROLATE 0.2 MG/ML IJ SOLN
INTRAMUSCULAR | Status: AC
  Filled 2014-06-25: qty 1

## 2014-06-25 MED ORDER — FENTANYL CITRATE 0.05 MG/ML IJ SOLN
INTRAMUSCULAR | Status: DC | PRN
  Administered 2014-06-25 (×5): 50 ug via INTRAVENOUS

## 2014-06-25 SURGICAL SUPPLY — 58 items
ARTHROWAND PARAGON T2 (SURGICAL WAND)
BAG HAMPER (MISCELLANEOUS) ×2 IMPLANT
BANDAGE ELASTIC 6 VELCRO NS (GAUZE/BANDAGES/DRESSINGS) ×2 IMPLANT
BLADE 11 SAFETY STRL DISP (BLADE) ×2 IMPLANT
BLADE AGGRESSIVE PLUS 4.0 (BLADE) ×2 IMPLANT
CHLORAPREP W/TINT 26ML (MISCELLANEOUS) ×4 IMPLANT
CLOTH BEACON ORANGE TIMEOUT ST (SAFETY) ×2 IMPLANT
COOLER CRYO IC GRAV AND TUBE (ORTHOPEDIC SUPPLIES) ×2 IMPLANT
CUFF CRYO KNEE LG 20X31 COOLER (ORTHOPEDIC SUPPLIES) ×1 IMPLANT
CUFF CRYO KNEE18X23 MED (MISCELLANEOUS) IMPLANT
CUFF TOURNIQUET SINGLE 34IN LL (TOURNIQUET CUFF) ×1 IMPLANT
CUFF TOURNIQUET SINGLE 44IN (TOURNIQUET CUFF) IMPLANT
CUTTER ANGLED DBL BITE 4.5 (BURR) IMPLANT
DECANTER SPIKE VIAL GLASS SM (MISCELLANEOUS) ×4 IMPLANT
GAUZE SPONGE 4X4 12PLY STRL (GAUZE/BANDAGES/DRESSINGS) ×2 IMPLANT
GAUZE SPONGE 4X4 16PLY XRAY LF (GAUZE/BANDAGES/DRESSINGS) ×2 IMPLANT
GAUZE XEROFORM 5X9 LF (GAUZE/BANDAGES/DRESSINGS) ×2 IMPLANT
GLOVE BIOGEL M 6.5 STRL (GLOVE) ×2 IMPLANT
GLOVE BIOGEL PI IND STRL 6.5 (GLOVE) IMPLANT
GLOVE BIOGEL PI IND STRL 7.0 (GLOVE) IMPLANT
GLOVE BIOGEL PI INDICATOR 6.5 (GLOVE) ×1
GLOVE BIOGEL PI INDICATOR 7.0 (GLOVE) ×1
GLOVE EXAM NITRILE MD LF STRL (GLOVE) ×1 IMPLANT
GLOVE SKINSENSE NS SZ8.0 LF (GLOVE) ×1
GLOVE SKINSENSE STRL SZ8.0 LF (GLOVE) ×1 IMPLANT
GLOVE SS N UNI LF 8.5 STRL (GLOVE) ×2 IMPLANT
GOWN STRL REUS W/TWL LRG LVL3 (GOWN DISPOSABLE) ×6 IMPLANT
GOWN STRL REUS W/TWL XL LVL3 (GOWN DISPOSABLE) ×2 IMPLANT
HLDR LEG FOAM (MISCELLANEOUS) ×1 IMPLANT
IV NS IRRIG 3000ML ARTHROMATIC (IV SOLUTION) ×5 IMPLANT
KIT BLADEGUARD II DBL (SET/KITS/TRAYS/PACK) ×2 IMPLANT
KIT ROOM TURNOVER AP CYSTO (KITS) ×2 IMPLANT
LEG HOLDER FOAM (MISCELLANEOUS) ×1
MANIFOLD NEPTUNE II (INSTRUMENTS) ×2 IMPLANT
MARKER SKIN DUAL TIP RULER LAB (MISCELLANEOUS) ×2 IMPLANT
NDL HYPO 18GX1.5 BLUNT FILL (NEEDLE) ×1 IMPLANT
NDL HYPO 21X1.5 SAFETY (NEEDLE) ×1 IMPLANT
NDL SPNL 18GX3.5 QUINCKE PK (NEEDLE) ×1 IMPLANT
NEEDLE HYPO 18GX1.5 BLUNT FILL (NEEDLE) ×2 IMPLANT
NEEDLE HYPO 21X1.5 SAFETY (NEEDLE) ×2 IMPLANT
NEEDLE SPNL 18GX3.5 QUINCKE PK (NEEDLE) ×2 IMPLANT
NS IRRIG 1000ML POUR BTL (IV SOLUTION) ×2 IMPLANT
PACK ARTHRO LIMB DRAPE STRL (MISCELLANEOUS) ×1 IMPLANT
PAD ABD 5X9 TENDERSORB (GAUZE/BANDAGES/DRESSINGS) ×2 IMPLANT
PAD ARMBOARD 7.5X6 YLW CONV (MISCELLANEOUS) ×2 IMPLANT
PADDING CAST COTTON 6X4 STRL (CAST SUPPLIES) ×2 IMPLANT
PADDING WEBRIL 6 STERILE (GAUZE/BANDAGES/DRESSINGS) ×1 IMPLANT
SET ARTHROSCOPY INST (INSTRUMENTS) ×2 IMPLANT
SET ARTHROSCOPY PUMP TUBE (IRRIGATION / IRRIGATOR) ×2 IMPLANT
SET BASIN LINEN APH (SET/KITS/TRAYS/PACK) ×2 IMPLANT
SPONGE GAUZE 4X4 12PLY (GAUZE/BANDAGES/DRESSINGS) ×1 IMPLANT
SUT ETHILON 3 0 FSL (SUTURE) IMPLANT
SYR 30ML LL (SYRINGE) ×2 IMPLANT
SYRINGE 10CC LL (SYRINGE) ×2 IMPLANT
WAND 50 DEG COVAC W/CORD (SURGICAL WAND) ×1 IMPLANT
WAND 90 DEG TURBOVAC W/CORD (SURGICAL WAND) IMPLANT
WAND ARTHRO PARAGON T2 (SURGICAL WAND) IMPLANT
YANKAUER SUCT BULB TIP 10FT TU (MISCELLANEOUS) ×6 IMPLANT

## 2014-06-25 NOTE — Addendum Note (Signed)
Addendum created 06/25/14 1537 by Vista Deck, CRNA   Modules edited: Charges VN

## 2014-06-25 NOTE — Op Note (Addendum)
06/25/2014  11:48 AM  PATIENT:  Nicholas Delacruz  48 y.o. male  PRE-OPERATIVE DIAGNOSIS:  Osteoarthritis left knee  POST-OPERATIVE DIAGNOSIS:  Osteoarthritis left knee, Torn medial meniscus  Operative findings extensive degeneration of the knee joint  Lateral compartment normal  Patellofemoral compartment trochlear and patella degenerative arthritis grade 2 and 3  Medial compartment vertical tear medial meniscus at the posterior horn body junction. Degeneration grade 3 articular cartilage  Synovitis moderate  Anterior cruciate ligament PCL intact  Spur at the base of the anterior cruciate ligament insertion which was resected    PROCEDURE:  Procedure(s): KNEE ARTHROSCOPY WITH MEDIAL MENISECTOMY (Left)  SURGEON:  Surgeon(s) and Role:    * Carole Civil, MD - Primary  PHYSICIAN ASSISTANT:   ASSISTANTS: none   ANESTHESIA:   general  EBL:  Total I/O In: 1000 [I.V.:1000] Out: -   BLOOD ADMINISTERED:none  DRAINS: none   LOCAL MEDICATIONS USED:  MARCAINE     SPECIMEN:  No Specimen  DISPOSITION OF SPECIMEN:  N/A  COUNTS:  YES  TOURNIQUET:    DICTATION: .Dragon Dictation  PLAN OF CARE: Discharge after PACU  PATIENT DISPOSITION:  PACU - hemodynamically stable.   Delay start of Pharmacological VTE agent (>24hrs) due to surgical blood loss or risk of bleeding: not applicable   Indications for procedure pain mechanical symptoms unresponsive to nonoperative treatment  The patient was identified in the preop holding area as Nicholas Delacruz left knee was confirmed as a surgical site and marked. The chart was reviewed  The patient was taken to the operating room and  was given appropriate preoperative antibiotic Ancef 3 g and general anesthesia was administered. The operative leg (left) was placed in the arthroscopic leg holder, the well leg was placed in a well leg holder  The left leg was then prepped and draped sterile The surgical site was confirmed and  the timeout procedure was completed  The lateral portal was injected with Marcaine with epinephrine solution and a stab wound was made. The scope was placed in the lateral portal into the medial compartment. The  Diagnostic portion of the  arthroscopy was completed. A medial portal was established in the same fashion and a probe was placed into the joint. The diagnostic arthroscopy   was repeated using a probe to palpate intra-articular structures  A duckbill forceps was used to morcellized the medial meniscal tear, the fragments were removed with a motorized shaver. The meniscus was then balanced with an ArthroCare wand 50 probe.  A probe was then used to confirm a stable rim.  The shaver was then used to remove the anterior osteophyte at the base of the anterior cruciate ligament.  The knee was irrigated meniscal fragments remaining were removed. The portals were closed with 3-0 nylon suture. The knee joint was then injected with 45 cc of Marcaine with epinephrine. A sterile dressing was applied followed by an Ace bandage and a Cryo/Cuff.  The patient was extubated and taken to the recovery room in stable condition.  Postoperative plan weightbearing as tolerated  Physical therapy x4 weeks

## 2014-06-25 NOTE — Interval H&P Note (Signed)
History and Physical Interval Note:  06/25/2014 10:35 AM  Nicholas Delacruz  has presented today for surgery, with the diagnosis of Osteoarthritis left knee  The various methods of treatment have been discussed with the patient and family. After consideration of risks, benefits and other options for treatment, the patient has consented to  Procedure(s) with comments: KNEE ARTHROSCOPY WITH MEDIAL MENISECTOMY (Left) - Extensive Debridement as a surgical intervention .  The patient's history has been reviewed, patient examined, no change in status, stable for surgery.  I have reviewed the patient's chart and labs.  Questions were answered to the patient's satisfaction.     Arther Abbott

## 2014-06-25 NOTE — Anesthesia Procedure Notes (Signed)
Procedure Name: Intubation Date/Time: 06/25/2014 11:02 AM Performed by: Andree Elk, AMY A Pre-anesthesia Checklist: Patient identified, Patient being monitored, Timeout performed, Emergency Drugs available and Suction available Patient Re-evaluated:Patient Re-evaluated prior to inductionOxygen Delivery Method: Circle System Utilized Preoxygenation: Pre-oxygenation with 100% oxygen Intubation Type: IV induction, Rapid sequence and Cricoid Pressure applied Ventilation: Mask ventilation without difficulty Laryngoscope Size: Mac and 3 Grade View: Grade I Tube type: Oral Tube size: 7.0 mm Number of attempts: 1 Airway Equipment and Method: stylet and Video-laryngoscopy Placement Confirmation: ETT inserted through vocal cords under direct vision,  positive ETCO2 and breath sounds checked- equal and bilateral Secured at: 21 cm Tube secured with: Tape Dental Injury: Teeth and Oropharynx as per pre-operative assessment

## 2014-06-25 NOTE — Transfer of Care (Signed)
Immediate Anesthesia Transfer of Care Note  Patient: Nicholas Delacruz  Procedure(s) Performed: Procedure(s): KNEE ARTHROSCOPY WITH MEDIAL MENISECTOMY (Left)  Patient Location: PACU  Anesthesia Type:General  Level of Consciousness: awake, alert , oriented and patient cooperative  Airway & Oxygen Therapy: Patient Spontanous Breathing and Patient connected to face mask oxygen  Post-op Assessment: Report given to PACU RN and Post -op Vital signs reviewed and stable  Post vital signs: Reviewed and stable  Complications: No apparent anesthesia complications

## 2014-06-25 NOTE — Anesthesia Preprocedure Evaluation (Addendum)
Anesthesia Evaluation  Patient identified by MRN, date of birth, ID band Patient awake    Reviewed: Allergy & Precautions, NPO status , Patient's Chart, lab work & pertinent test results  History of Anesthesia Complications (+) PONV and history of anesthetic complications  Airway Mallampati: III TM Distance: >3 FB Neck ROM: Full    Dental  (+) Teeth Intact, Dental Advisory Given   Pulmonary sleep apnea and Continuous Positive Airway Pressure Ventilation ,  breath sounds clear to auscultation        Cardiovascular hypertension, Pt. on medications Rhythm:Regular Rate:Normal     Neuro/Psych    GI/Hepatic GERD-  Medicated,  Endo/Other  Morbid obesity  Renal/GU      Musculoskeletal  (+) Arthritis -,   Abdominal (+) + obese,   Peds  Hematology   Anesthesia Other Findings   Reproductive/Obstetrics                          Anesthesia Physical Anesthesia Plan  ASA: III  Anesthesia Plan: General   Post-op Pain Management:    Induction: Intravenous, Rapid sequence and Cricoid pressure planned  Airway Management Planned: Oral ETT and Video Laryngoscope Planned  Additional Equipment:   Intra-op Plan:   Post-operative Plan: Extubation in OR  Informed Consent: I have reviewed the patients History and Physical, chart, labs and discussed the procedure including the risks, benefits and alternatives for the proposed anesthesia with the patient or authorized representative who has indicated his/her understanding and acceptance.     Plan Discussed with:   Anesthesia Plan Comments:        Anesthesia Quick Evaluation

## 2014-06-25 NOTE — Brief Op Note (Signed)
06/25/2014  11:48 AM  PATIENT:  Nicholas Delacruz  48 y.o. male  PRE-OPERATIVE DIAGNOSIS:  Osteoarthritis left knee  POST-OPERATIVE DIAGNOSIS:  Osteoarthritis left knee, Torn medial meniscus  Operative findings extensive degeneration of the knee joint  Lateral compartment normal  Patellofemoral compartment trochlear and patella degenerative arthritis grade 2 and 3  Medial compartment vertical tear medial meniscus at the posterior horn body junction. Degeneration grade 3 articular cartilage  Synovitis moderate  Anterior cruciate ligament PCL intact  Spur at the base of the anterior cruciate ligament insertion which was resected    PROCEDURE:  Procedure(s): KNEE ARTHROSCOPY WITH MEDIAL MENISECTOMY (Left)  SURGEON:  Surgeon(s) and Role:    * Carole Civil, MD - Primary  PHYSICIAN ASSISTANT:   ASSISTANTS: none   ANESTHESIA:   general  EBL:  Total I/O In: 1000 [I.V.:1000] Out: -   BLOOD ADMINISTERED:none  DRAINS: none   LOCAL MEDICATIONS USED:  MARCAINE     SPECIMEN:  No Specimen  DISPOSITION OF SPECIMEN:  N/A  COUNTS:  YES  TOURNIQUET:    DICTATION: .Dragon Dictation  PLAN OF CARE: Discharge after PACU  PATIENT DISPOSITION:  PACU - hemodynamically stable.   Delay start of Pharmacological VTE agent (>24hrs) due to surgical blood loss or risk of bleeding: not applicable   Indications for procedure pain mechanical symptoms unresponsive to nonoperative treatment  The patient was identified in the preop holding area as [name] left knee was confirmed as a surgical site and marked. The chart was reviewed  The patient was taken to the operating room and  was given appropriate preoperative antibiotic Ancef 3 g and general anesthesia was administered. The operative leg (left) was placed in the arthroscopic leg holder, the well leg was placed in a well leg holder  The left leg was then prepped and draped sterile The surgical site was confirmed and  the timeout procedure was completed  The lateral portal was injected with Marcaine with epinephrine solution and a stab wound was made. The scope was placed in the lateral portal into the medial compartment. The  Diagnostic portion of the  arthroscopy was completed. A medial portal was established in the same fashion and a probe was placed into the joint. The diagnostic arthroscopy   was repeated using a probe to palpate intra-articular structures  A duckbill forceps was used to morcellized the medial meniscal tear, the fragments were removed with a motorized shaver. The meniscus was then balanced with an ArthroCare wand 50 probe.  A probe was then used to confirm a stable rim.  The knee was irrigated meniscal fragments remaining were removed. The portals were closed with 3-0 nylon suture. The knee joint was then injected with 45 cc of Marcaine with epinephrine. A sterile dressing was applied followed by an Ace bandage and a Cryo/Cuff.  The patient was extubated and taken to the recovery room in stable condition.  Postoperative plan weightbearing as tolerated  Physical therapy x4 weeks

## 2014-06-25 NOTE — Anesthesia Postprocedure Evaluation (Signed)
  Anesthesia Post-op Note  Patient: Nicholas Delacruz  Procedure(s) Performed: Procedure(s): KNEE ARTHROSCOPY WITH MEDIAL MENISECTOMY (Left)  Patient Location: PACU  Anesthesia Type:General  Level of Consciousness: awake, alert , oriented and patient cooperative  Airway and Oxygen Therapy: Patient Spontanous Breathing and Patient connected to face mask oxygen  Post-op Pain: none  Post-op Assessment: Post-op Vital signs reviewed, Patient's Cardiovascular Status Stable, Respiratory Function Stable, Patent Airway, No signs of Nausea or vomiting and Pain level controlled  Post-op Vital Signs: Reviewed and stable  Last Vitals:  Filed Vitals:   06/25/14 1029  Temp: 46.6 C    Complications: No apparent anesthesia complications

## 2014-06-25 NOTE — Discharge Instructions (Signed)
Arthroscopic Procedure, Knee, Care After °Refer to this sheet in the next few weeks. These discharge instructions provide you with general information on caring for yourself after you leave the hospital. Your health care provider may also give you specific instructions. Your treatment has been planned according to the most current medical practices available, but unavoidable complications sometimes occur. If you have any problems or questions after discharge, please call your health care provider. °HOME CARE INSTRUCTIONS  °· It is normal to be sore for a couple days after surgery. See your health care provider if this seems to be getting worse rather than better. °· Only take over-the-counter or prescription medicines for pain, discomfort, or fever as directed by your health care provider. °· Take showers rather than baths, or as directed by your health care provider. °· Change bandages (dressings) if necessary or as directed. °· You may resume normal diet and activities as directed or allowed. °· Avoid lifting and driving until you are directed otherwise. °· Make an appointment to see your health care provider for stitches (suture) or staple removal as directed. °· You may put ice on the area. °· Put the ice in a plastic bag. Place a towel between your skin and the bag. °· Leave the ice on for 15-20 minutes, three to four times per day for the first 2 days. °· Elevate the knee above the level of your heart to reduce swelling, and avoid dangling the leg. °· Do 10-15 ankle pumps (pointing your toes toward you and then away from you) two to three times daily. °· If you are given compression stockings to wear after surgery, use them for as long as your surgeon tells you (around 10-14 days). °· Avoid smoking and exposure to secondhand smoke. °SEEK MEDICAL CARE IF:  °· You have increased bleeding from your wounds. °· You see redness or swelling or you have increasing pain in your wounds. °· You have pus coming from your  wound. °· You have a fever or persistent symptoms for more than 2-3 days. °· You notice a bad smell coming from the wound or dressing. °· You have severe pain with any motion of your knee. °SEEK IMMEDIATE MEDICAL CARE IF:  °· You develop a rash. °· You have difficulty breathing. °· You develop any reaction or side effects to medicines taken. °· You develop pain in the calves or back of the knee. °· You develop chest pain, shortness of breath, or difficulty breathing. °· You develop numbness or tingling in the leg or foot. °MAKE SURE YOU:  °· Understand these instructions. °· Will watch your condition. °· Will get help right away if you are not doing well or you get worse. °Document Released: 07/01/2005 Document Revised: 12/17/2013 Document Reviewed: 05/09/2013 °[employer]® Patient Information ©2015 [employer]. This information is not intended to replace advice given to you by your health care provider. Make sure you discuss any questions you have with your health care provider. ° °

## 2014-06-26 ENCOUNTER — Encounter (HOSPITAL_COMMUNITY): Payer: Self-pay | Admitting: Orthopedic Surgery

## 2014-06-30 ENCOUNTER — Ambulatory Visit (INDEPENDENT_AMBULATORY_CARE_PROVIDER_SITE_OTHER): Payer: 59 | Admitting: Orthopedic Surgery

## 2014-06-30 ENCOUNTER — Encounter: Payer: Self-pay | Admitting: Orthopedic Surgery

## 2014-06-30 VITALS — BP 148/92 | Ht 73.0 in | Wt 350.0 lb

## 2014-06-30 DIAGNOSIS — M23322 Other meniscus derangements, posterior horn of medial meniscus, left knee: Secondary | ICD-10-CM

## 2014-06-30 DIAGNOSIS — Z9889 Other specified postprocedural states: Secondary | ICD-10-CM

## 2014-06-30 DIAGNOSIS — IMO0002 Reserved for concepts with insufficient information to code with codable children: Secondary | ICD-10-CM

## 2014-06-30 DIAGNOSIS — M1712 Unilateral primary osteoarthritis, left knee: Secondary | ICD-10-CM

## 2014-06-30 DIAGNOSIS — M1710 Unilateral primary osteoarthritis, unspecified knee: Secondary | ICD-10-CM

## 2014-06-30 DIAGNOSIS — M23329 Other meniscus derangements, posterior horn of medial meniscus, unspecified knee: Secondary | ICD-10-CM

## 2014-06-30 MED ORDER — OXYCODONE-ACETAMINOPHEN 5-325 MG PO TABS: 1.0000 | ORAL_TABLET | ORAL | Status: DC | PRN

## 2014-06-30 NOTE — Patient Instructions (Addendum)
Start PT at [facility]

## 2014-06-30 NOTE — Progress Notes (Signed)
Patient ID: Nicholas Delacruz, male   DOB: Jan 07, 1966, 48 y.o.   MRN: [id number]  Chief Complaint  Patient presents with  . Follow-up    Post op #1, left knee. DOS 06-25-14.   PRE-OPERATIVE DIAGNOSIS:  Osteoarthritis left knee  POST-OPERATIVE DIAGNOSIS:  Osteoarthritis left knee, Torn medial meniscus  Operative findings extensive degeneration of the knee joint  Lateral compartment normal  Patellofemoral compartment trochlear and patella degenerative arthritis grade 2 and 3  Medial compartment vertical tear medial meniscus at the posterior horn body junction. Degeneration grade 3 articular cartilage  Synovitis moderate  Anterior cruciate ligament PCL intact  Spur at the base of the anterior cruciate ligament insertion which was resected   The knee looks good today mild swelling over the portals. He still complains of crepitation.  Hopefully his pain will improve after resecting the torn medial meniscus and cleaning of the arthritic knee joint  We will start physical therapy.  There still seemed to be some confusion regarding his disability leading up to this surgery. The patient was incapacitated secondary to the condition of his left knee this started back in January of 2015. He was trying to prevent necessity for knee replacement surgery. He "gutted" until he couldn't stand it anymore and then have the surgery as described above.

## 2014-07-01 ENCOUNTER — Telehealth: Payer: Self-pay | Admitting: Orthopedic Surgery

## 2014-07-01 NOTE — Telephone Encounter (Signed)
ROUTING TO DR HARRISON 

## 2014-07-01 NOTE — Telephone Encounter (Signed)
Patient called to relay that he was scheduled for physical therapy at [facility], but not till 07/15/14.  Asking if this is okay; also, if okay, can he pick up a list of some home exercises?  His ph# is [phone] (Home)

## 2014-07-02 ENCOUNTER — Other Ambulatory Visit: Payer: Self-pay | Admitting: *Deleted

## 2014-07-02 DIAGNOSIS — Z9889 Other specified postprocedural states: Secondary | ICD-10-CM

## 2014-07-02 NOTE — Telephone Encounter (Signed)
Per Dr Aline Brochure request, referral was sent to [facility] to be seen sooner, they will call patient.

## 2014-07-15 ENCOUNTER — Inpatient Hospital Stay (HOSPITAL_COMMUNITY): Admission: RE | Admit: 2014-07-15 | Payer: 59 | Source: Ambulatory Visit | Admitting: Physical Therapy

## 2014-07-28 ENCOUNTER — Telehealth: Payer: Self-pay | Admitting: Orthopedic Surgery

## 2014-07-28 ENCOUNTER — Ambulatory Visit (INDEPENDENT_AMBULATORY_CARE_PROVIDER_SITE_OTHER): Payer: 59 | Admitting: Orthopedic Surgery

## 2014-07-28 ENCOUNTER — Ambulatory Visit (INDEPENDENT_AMBULATORY_CARE_PROVIDER_SITE_OTHER): Payer: 59

## 2014-07-28 VITALS — BP 153/86 | Ht 73.0 in | Wt 350.0 lb

## 2014-07-28 DIAGNOSIS — M25569 Pain in unspecified knee: Secondary | ICD-10-CM

## 2014-07-28 DIAGNOSIS — M25561 Pain in right knee: Secondary | ICD-10-CM

## 2014-07-28 NOTE — Telephone Encounter (Signed)
Notes, date of service 07/28/14, faxed to [facility], representative for employer, [employer] per request, fax [phone] / copy to patient - as per signed release.

## 2014-07-28 NOTE — Progress Notes (Signed)
Patient ID: Nicholas Delacruz, male   DOB: [birth year], 48 y.o.   MRN: [id number] Chief Complaint  Patient presents with  . Follow-up    4 week recheck left knee, SALK,DOS 06/25/14  . 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 Knee stable motor exam normal mild tenderness no effusion skin intact distal pulses normal sensation. Oriented x3 mood and affect are normal as well. Vital signs Ht 6\' 1"  (1.854 m)  Wt 350 lb (158.759 kg)  BMI 46.19 kg/m2   Left knee doing well continue therapy with strengthening exercises using quadriceps exercises only nothing else  Right knee osteoarthritis progressing  Consider arthroscopic debridement and lavage in 4 weeks  Continue work for 4 weeks He will start therapy on the right knee as well.  No orders of the defined types were placed in this encounter.

## 2014-07-28 NOTE — Patient Instructions (Addendum)
NO squats Out of work 4 weeks

## 2014-07-29 NOTE — Telephone Encounter (Signed)
Patient has picked up a copy of the notes for date of service 07/28/14 as per request; signed release on file.

## 2014-08-25 ENCOUNTER — Ambulatory Visit (INDEPENDENT_AMBULATORY_CARE_PROVIDER_SITE_OTHER): Payer: 59 | Admitting: Orthopedic Surgery

## 2014-08-25 ENCOUNTER — Encounter: Payer: Self-pay | Admitting: Orthopedic Surgery

## 2014-08-25 VITALS — BP 158/96 | Ht 73.0 in | Wt 318.0 lb

## 2014-08-25 DIAGNOSIS — M1712 Unilateral primary osteoarthritis, left knee: Secondary | ICD-10-CM

## 2014-08-25 DIAGNOSIS — M1710 Unilateral primary osteoarthritis, unspecified knee: Secondary | ICD-10-CM

## 2014-08-25 MED ORDER — HYDROCODONE-ACETAMINOPHEN 10-325 MG PO TABS: 1.0000 | ORAL_TABLET | Freq: Four times a day (QID) | ORAL | Status: DC | PRN

## 2014-08-25 NOTE — Progress Notes (Signed)
BP 158/96  Ht 6\' 1"  (1.854 m)  Wt 318 lb (144.244 kg)  BMI 41.96 kg/m2

## 2014-08-25 NOTE — Progress Notes (Signed)
Chief Complaint  Patient presents with  . Follow-up    recheck Left knee, DOS 06/25/14    BP 158/96  Ht 6\' 1"  (1.854 m)  Wt 318 lb (144.244 kg)  BMI 41.96 kg/m2  For clarity, the patient is currently disabled from his knee surgery.   Left knee improving but persistent medial knee pain 3/10 range of motion 5-120  Right knee worsening range of motion limited to 100 flexion is pain catching locking giving way. Patient interested in arthroscopic debridement right knee.  I agree with this more conservative approach versus knee replacement with his BMI greater than 40  Followup 3 weeks continue therapy for now stay out of work.

## 2014-08-25 NOTE — Patient Instructions (Addendum)
BP 158/96  Ht 6\' 1"  (1.854 m)  Wt 310 lb (140.615 kg)  BMI 40.91 kg/m2.  Continue therapy, stay out of work

## 2014-09-03 ENCOUNTER — Telehealth: Payer: Self-pay | Admitting: Orthopedic Surgery

## 2014-09-03 NOTE — Telephone Encounter (Signed)
Routing to Dr Harrison 

## 2014-09-03 NOTE — Telephone Encounter (Signed)
Patient called to ask if he can have an MRI of his right knee. He is scheduled for appointment here 09/11/14 for post op visit/re-check of left knee, and to discuss option of surgery for his right knee, per last office visit 08/25/14.  Relayed there is process and protocol for MRI - please advise. Patient phone# [phone]

## 2014-09-04 NOTE — Telephone Encounter (Signed)
CALLED PATIENT, NO ANSWER, LEFT VM

## 2014-09-04 NOTE — Telephone Encounter (Signed)
No discuss next visit

## 2014-09-11 ENCOUNTER — Encounter: Payer: Self-pay | Admitting: Orthopedic Surgery

## 2014-09-11 ENCOUNTER — Ambulatory Visit (INDEPENDENT_AMBULATORY_CARE_PROVIDER_SITE_OTHER): Payer: 59 | Admitting: Orthopedic Surgery

## 2014-09-11 VITALS — BP 142/82 | Ht 73.0 in | Wt 318.0 lb

## 2014-09-11 DIAGNOSIS — M1710 Unilateral primary osteoarthritis, unspecified knee: Secondary | ICD-10-CM

## 2014-09-11 DIAGNOSIS — M25569 Pain in unspecified knee: Secondary | ICD-10-CM

## 2014-09-11 DIAGNOSIS — Z5189 Encounter for other specified aftercare: Secondary | ICD-10-CM

## 2014-09-11 DIAGNOSIS — Z9889 Other specified postprocedural states: Secondary | ICD-10-CM

## 2014-09-11 DIAGNOSIS — S83206D Unspecified tear of unspecified meniscus, current injury, right knee, subsequent encounter: Secondary | ICD-10-CM

## 2014-09-11 NOTE — Patient Instructions (Signed)
MRI right knee  Continue OOW  Therapy right knee at [facility] out patient

## 2014-09-11 NOTE — Progress Notes (Signed)
Chief Complaint  Patient presents with  . Follow-up    Recheck left knee, post op SALK. DOS 06-25-14.    Followup visit left knee status post left knee arthroscopy details of his arthroscopic procedure are detailed in previous notes and operative report  He has regained full range of motion he has some medial tenderness and some suprapatellar lateral tenderness but no effusion. He does have mild crepitance. He has excellent motion now and is happy with his left knee  The right knee continues to give him trouble we think he may have a transfer mood issue from unloading the left knee. He has complaints of crepitance pain and restricted range of motion. He would like reevaluation of that knee.  His exam shows no fever chills redness numbness in the right lower extremity  BP 142/82  Ht 6\' 1"  (1.854 m)  Wt 318 lb (144.244 kg)  BMI 41.96 kg/m2 Has crepitance on range of motion and medial and lateral joint line tenderness his range of motion is restricted to 110 he comes to full extension. The knee is stable strength is normal skin is normal pulses are normal temperature normal there is no lymph node abnormality sensation is normal there are no pathologic reflexes she has good control of the limb is appearance is normal is oriented x3 his mood is normal his gait is unsupported  His McMurray sign is positive for medial meniscal tear in the right knee  MRI right knee  Come back for reading and further treatment recommendation  Status post left knee arthroscopy osteoarthritis Osteoarthritis right knee, medial meniscal tear  32440-10 Evaluation right knee and the global period which lasts from July 1 through October 1 or late September

## 2014-09-16 ENCOUNTER — Ambulatory Visit (HOSPITAL_COMMUNITY)
Admission: RE | Admit: 2014-09-16 | Discharge: 2014-09-16 | Disposition: A | Discharge status: Home / Self Care | Payer: 59 | Source: Ambulatory Visit | Attending: Orthopedic Surgery | Admitting: Orthopedic Surgery

## 2014-09-16 DIAGNOSIS — M6281 Muscle weakness (generalized): Secondary | ICD-10-CM | POA: Diagnosis not present

## 2014-09-16 DIAGNOSIS — M25569 Pain in unspecified knee: Secondary | ICD-10-CM | POA: Diagnosis not present

## 2014-09-16 DIAGNOSIS — R262 Difficulty in walking, not elsewhere classified: Secondary | ICD-10-CM | POA: Insufficient documentation

## 2014-09-16 DIAGNOSIS — M25669 Stiffness of unspecified knee, not elsewhere classified: Secondary | ICD-10-CM | POA: Insufficient documentation

## 2014-09-16 DIAGNOSIS — IMO0001 Reserved for inherently not codable concepts without codable children: Secondary | ICD-10-CM | POA: Diagnosis present

## 2014-09-16 NOTE — Evaluation (Signed)
Physical Therapy Evaluation  Patient Details  Name: Nicholas Delacruz MRN: [id number] Date of Birth: Sep 26, 1966  Today's Date: 09/16/2014 Time: 7001-7494 PT Time Calculation (min): 45 min     1 Evaluation         Visit#: 1 of 12  Re-eval: 10/16/14 Assessment Diagnosis: Lt and Rt knee pain/stiffness follwoign arthroscopic surgeries.  Surgical Date: 06/25/14 Next MD Visit: [place] Prior Therapy: yes, OP, helpful  Authorization: [facility]     Past Medical History:  Past Medical History  Diagnosis Date  . High blood pressure   . Obstructive sleep apnea on CPAP   . PONV (postoperative nausea and vomiting)   . GERD (gastroesophageal reflux disease)    Past Surgical History:  Past Surgical History  Procedure Laterality Date  . Right knee sark    . Knee surgery Left   . Shoulder surgery      left  . Appendectomy    . Knee arthroscopy with medial menisectomy Left 06/25/2014    Procedure: KNEE ARTHROSCOPY WITH MEDIAL MENISECTOMY;  Surgeon: Carole Civil, MD;  Location: [facility];  Service: Orthopedics;  Laterality: Left;    Subjective Symptoms/Limitations Pertinent History: Surgery on June 25, 2014, on th Lt knee, Patient also has pain and stiffness in Rt knee. Patient has had 5 arthroscopic surgeries on Rt LE. "My Lt knee is movign good now but i also have pain and stiffness in my Lt knee.  How long can you sit comfortably?: no difficulty but causes stiffness.  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  Pain Assessment Currently in Pain?: Yes Pain Score: 3  Pain Location: Knee Pain Orientation: Left;Anterior;Medial (Rt knee on inside 5/10) Pain Type: Chronic pain Pain Onset: More than a month ago Pain Frequency: Constant Pain Relieving Factors: sitting (sitting) Effect of Pain on Daily Activities: un able to bear weight on knees, difficukty walking,  difficulty doing stairs.   Cognition/Observation Observation/Other Assessments Observations: Walking: limited Rt toe in with internal rotation walk, limited tibial interanal rotation, lateral toe whip.   Sensation/Coordination/Flexibility/Functional Tests Flexibility Thomas: Positive Obers: Positive 90/90: Negative Functional Tests Functional Tests: Ely's test: positive for Rt LE.   Assessment RLE AROM (degrees) Right Knee Extension: -5 Right Knee Flexion: 97 Right Ankle Dorsiflexion: 13 RLE Strength Right Hip Flexion: 5/5 Right Hip External Rotation : 45 Right Hip Internal Rotation : 20 Right Hip ABduction: 3+/5 Right Knee Flexion:  (4-/5) Right Ankle Dorsiflexion: 5/5 LLE AROM (degrees) Left Hip External Rotation : 45 Left Hip Internal Rotation : 20 Left Knee Extension: 0 Left Knee Flexion: 120 Left Ankle Dorsiflexion: 7 LLE Strength Left Hip Flexion: 5/5 Left Knee Flexion: 4/5 Left Knee Extension: 4/5 Left Ankle Dorsiflexion: 5/5  Exercise/Treatments Mobility/Balance  Ambulation/Gait Stairs: Yes Stair Management Technique: Step to pattern;One rail Left;One rail Right   Physical Therapy Assessment and Plan PT Assessment and Plan Clinical Impression Statement: Patient displays bilateral knee pain and stiffness witht the Lt knee stiffness/weakness attributed to arthroscopic surgery and the Rt knee pain stiffness attributed to likely arthritis or meniscal pathology. Patient will benefit from skilled phsyical therapy to address the above listed limitations so patient can return to prior level of fucntion and  return to work.  Pt will benefit from skilled therapeutic intervention in order to improve on the following deficits: Abnormal gait;Decreased activity tolerance;Decreased balance;Decreased strength;Difficulty walking;Pain;Impaired flexibility;Increased fascial restricitons Rehab Potential: Fair  Clinical Impairments Affecting Rehab Potential: patient has a long  history of knee pain.  PT Frequency: Min 2X/week PT Duration: 6 weeks PT Treatment/Interventions: Gait training;Stair training;Functional mobility training;Therapeutic activities;Therapeutic exercise;Balance training;Neuromuscular re-education;Manual techniques;Modalities;Patient/family education PT Plan: Ibnitial focus of therapy to be on restoring knee AROM and increasign hip and ankle mobility to decrease strain on knee. THerapy to progress to strengtheing as mobility and pain improve.     Goals PT Short Term Goals Time to Complete Short Term Goals: 3 weeks PT Short Term Goal 1: Patient will demosntrate increased hip internal rotatio bilaterally of >30 degrees to improve patient's deceleration of foot strike mechanics.  PT Short Term Goal 2: Patient will demosntrate bilateral ankle dorsiflexion of >15 degrees so patient can ambulate with increased stride length PT Short Term Goal 3: Patient will demsontrate a negative 90/90 hamstrign test indicatign increased hamstring mobility so patient can more easily tie shoes PT Short Term Goal 4: Patient will dmeosntrate a negative ely test bilaterally indicating increased quadraceps mobility to patient can more easily squat to floor PT Long Term Goals Time to Complete Long Term Goals:  (6 weeks) PT Long Term Goal 1: Patient will demosntrate increased knee extension strength of 4/5 MMT bilaterally without pain so patient can ambulate up steps with UE support with reciprocal gait.  PT Long Term Goal 2: Patient will dmeosntrate increased hamstring, glut med/max strength of 4/5 MMT so patient can single leg stand > 20 seconds.  Long Term Goal 3: Patient will demosntrate increased knee extension strength of +4/5 MMT bilaterally without pain so patient can ambulate downsteps with UE support with reciprocal gait.   Problem List Patient Active Problem List   Diagnosis Date Noted  . Derangement of posterior horn of medial meniscus 06/30/2014  . S/P knee  surgery 06/30/2014  . Weakness of left leg 03/13/2014  . Difficulty in walking(719.7) 03/13/2014  . Stiffness of joint, not elsewhere classified, lower leg 03/13/2014  . Arthritis of knee, left 01/21/2014  . Arthritis of knee 11/13/2012  . Cervicalgia 11/08/2011  . Rotator cuff syndrome of left shoulder 11/08/2011  . S/P complete repair of rotator cuff 07/21/2011  . RHEUMATOID ARTHRITIS 02/01/2011  . RUPTURE ROTATOR CUFF 02/01/2011  . SUPERIOR GLENOID LABRUM TEAR 01/12/2011  . SHOULDER PAIN 12/07/2010  . IMPINGEMENT SYNDROME 12/07/2010  . CONTUSION, LEFT KNEE 03/16/2010  . KNEE, ARTHRITIS, DEGEN./OSTEO 09/28/2009  . JOINT EFFUSION, LEFT KNEE 08/25/2009  . KNEE PAIN 05/14/2009  . TEAR MEDIAL MENISCUS 05/14/2009  . HIGH BLOOD PRESSURE 05/13/2009    PT - End of Session Activity Tolerance: Patient tolerated treatment well General Behavior During Therapy: [facility] for tasks assessed/performed PT Plan of Care PT Home Exercise Plan: quadraceps stretch4x 20seconds  GP    Ellinore Merced R 09/16/2014, 6:16 PM  Physician Documentation Your signature is required to indicate approval of the treatment plan as stated above.  Please sign and either send electronically or make a copy of this report for your files and return this physician signed original.   Please mark one 1.__approve of plan  2. ___approve of plan with the following conditions.   ______________________________                                                          _____________________ Physician Signature  Date  

## 2014-09-19 ENCOUNTER — Ambulatory Visit (HOSPITAL_COMMUNITY)
Admission: RE | Admit: 2014-09-19 | Discharge: 2014-09-19 | Disposition: A | Discharge status: Home / Self Care | Payer: 59 | Source: Ambulatory Visit | Attending: Orthopedic Surgery | Admitting: Orthopedic Surgery

## 2014-09-19 DIAGNOSIS — IMO0001 Reserved for inherently not codable concepts without codable children: Secondary | ICD-10-CM | POA: Diagnosis not present

## 2014-09-19 NOTE — Progress Notes (Signed)
Physical Therapy Treatment Patient Details  Name: Nicholas Delacruz MRN: [id number] Date of Birth: [birth year]  Today's Date: 09/19/2014 Time: 1497-0263 PT Time Calculation (min): 45 min Charge there ex  7858-8502 Visit#: 2 of 12  Re-eval: 10/16/14    Authorization: [employer]   Subjective: Symptoms/Limitations Symptoms: Pt states he was bit by a spider on his Lt leg which is hurting him more than his knee at this time. Pain Assessment Currently in Pain?: Yes Pain Score: 3  Pain Location: Knee Pain Orientation: Left Exercise/Treatments       Stretches Passive Hamstring Stretch: 3 reps;30 seconds;Limitations Passive Hamstring Stretch Limitations: 14' step  Quad Stretch: 3 reps;30 seconds;Limitations Quad Stretch Limitations: standing  Gastroc Stretch: 3 reps;30 seconds;Limitations Gastroc Stretch Limitations: slant board  Aerobic Stationary Bike: nustep hills L 4 x 10:00    Standing Heel Raises: 10 reps;Limitations Heel Raises Limitations: toe raises 10 x  Knee Flexion: 10 reps;Limitations Knee Flexion Limitations: 4 Forward Lunges: 5 reps;Limitations Forward Lunges Limitations: onto 14' step Terminal Knee Extension: Limitations Lateral Step Up: 10 reps Forward Step Up: 10 reps Functional Squat: 10 reps Rocker Board: 2 minutes SLS: 3 x max 12" Supine Quad Sets: 10 reps Heel Slides: 10 reps Knee Extension: PROM Knee Flexion: PROM    Physical Therapy Assessment and Plan PT Assessment and Plan Clinical Impression Statement: Pt has difficulty with Rt knee as well as LT, (states he is going to have to have his Rt knee operated on soon), Rt knee limits pt exercise ability.  Pt able to complete exercises with proper technique with therapist facilitation.  PT Plan: continue to restoration of knee and hip mobility.          Problem List Patient Active Problem List   Diagnosis Date Noted  . Derangement of posterior horn of medial meniscus 06/30/2014  . S/P knee surgery  06/30/2014  . Weakness of left leg 03/13/2014  . Difficulty in walking(719.7) 03/13/2014  . Stiffness of joint, not elsewhere classified, lower leg 03/13/2014  . Arthritis of knee, left 01/21/2014  . Arthritis of knee 11/13/2012  . Cervicalgia 11/08/2011  . Rotator cuff syndrome of left shoulder 11/08/2011  . S/P complete repair of rotator cuff 07/21/2011  . RHEUMATOID ARTHRITIS 02/01/2011  . RUPTURE ROTATOR CUFF 02/01/2011  . SUPERIOR GLENOID LABRUM TEAR 01/12/2011  . SHOULDER PAIN 12/07/2010  . IMPINGEMENT SYNDROME 12/07/2010  . CONTUSION, LEFT KNEE 03/16/2010  . KNEE, ARTHRITIS, DEGEN./OSTEO 09/28/2009  . JOINT EFFUSION, LEFT KNEE 08/25/2009  . KNEE PAIN 05/14/2009  . TEAR MEDIAL MENISCUS 05/14/2009  . HIGH BLOOD PRESSURE 05/13/2009       GP    Aayushi Solorzano,CINDY 09/19/2014, 5:04 PM

## 2014-09-23 ENCOUNTER — Inpatient Hospital Stay (HOSPITAL_COMMUNITY): Admission: RE | Admit: 2014-09-23 | Payer: 59 | Source: Ambulatory Visit | Admitting: Physical Therapy

## 2014-09-24 ENCOUNTER — Encounter: Payer: Self-pay | Admitting: Orthopedic Surgery

## 2014-09-24 ENCOUNTER — Ambulatory Visit (INDEPENDENT_AMBULATORY_CARE_PROVIDER_SITE_OTHER): Payer: 59 | Admitting: Orthopedic Surgery

## 2014-09-24 ENCOUNTER — Ambulatory Visit (HOSPITAL_COMMUNITY): Admission: RE | Admit: 2014-09-24 | Payer: 59 | Source: Ambulatory Visit | Admitting: Physical Therapy

## 2014-09-24 VITALS — BP 156/95 | Ht 73.0 in | Wt 318.0 lb

## 2014-09-24 DIAGNOSIS — M1711 Unilateral primary osteoarthritis, right knee: Secondary | ICD-10-CM

## 2014-09-24 DIAGNOSIS — M1710 Unilateral primary osteoarthritis, unspecified knee: Secondary | ICD-10-CM

## 2014-09-24 DIAGNOSIS — Z9889 Other specified postprocedural states: Secondary | ICD-10-CM

## 2014-09-24 NOTE — Progress Notes (Signed)
Chief Complaint  Patient presents with  . Follow-up    Recheck right knee. Discuss surgery.   Arthroscopy left knee 06/25/2014  90 day.    Recheck right knee and left knee  Status post left knee arthroscopy  Right knee MRI he denied Patient complains a loss of motion and grinding although is having more pain in his left knee than right knee  We will go ahead and get him scheduled for therapy on the right and left knee.  BP 156/95  Ht 6\' 1"  (1.854 m)  Wt 318 lb (144.244 kg)  BMI 41.96 kg/m2  Right knee knee flexion Limited 100  with crepitance  medial lateral jointline tenderness  muscle tone normal, strength normal, reflexes normal, McMurray sign positive  ligament stable  Arthritis right knee therapy and  Weakness left knee continue therapy

## 2014-09-24 NOTE — Patient Instructions (Addendum)
Out of work 1 month Call to arrange therapy at [facility]

## 2014-09-25 ENCOUNTER — Telehealth (HOSPITAL_COMMUNITY): Payer: Self-pay

## 2014-09-25 ENCOUNTER — Ambulatory Visit: Payer: 59 | Admitting: Orthopedic Surgery

## 2014-09-25 NOTE — Telephone Encounter (Signed)
Left a message to cancel all appointments.  He saw Dr. Aline Brochure and he told him to hold off therapy until after surgery

## 2014-09-29 ENCOUNTER — Ambulatory Visit (HOSPITAL_COMMUNITY): Payer: 59

## 2014-10-01 ENCOUNTER — Ambulatory Visit (HOSPITAL_COMMUNITY): Payer: 59

## 2014-10-06 ENCOUNTER — Ambulatory Visit (HOSPITAL_COMMUNITY): Payer: 59 | Admitting: Physical Therapy

## 2014-10-08 ENCOUNTER — Ambulatory Visit (HOSPITAL_COMMUNITY): Payer: 59

## 2014-10-13 ENCOUNTER — Ambulatory Visit (HOSPITAL_COMMUNITY): Payer: 59 | Admitting: Physical Therapy

## 2014-10-14 NOTE — Progress Notes (Signed)
  Patient Details  Name: Nicholas Delacruz MRN: [id number] Date of Birth: 11/24/1966  Today's Date: 10/14/2014  Patient discharged following 2 visits as patient anticipates surgery.  [name] 10/14/2014, 8:27 AM

## 2014-10-14 NOTE — Addendum Note (Signed)
Encounter addended by: Leia Alf, PT on: 10/14/2014  8:29 AM<BR>     Documentation filed: Episodes, Letters, Notes Section

## 2014-10-15 ENCOUNTER — Ambulatory Visit (HOSPITAL_COMMUNITY): Payer: 59

## 2014-10-15 NOTE — Addendum Note (Signed)
Encounter addended by: Leia Alf, PT on: 10/15/2014  6:27 PM<BR>     Documentation filed: Episodes

## 2014-10-20 ENCOUNTER — Ambulatory Visit (HOSPITAL_COMMUNITY): Payer: 59

## 2014-10-22 ENCOUNTER — Ambulatory Visit (HOSPITAL_COMMUNITY): Payer: 59 | Admitting: Physical Therapy

## 2014-10-23 ENCOUNTER — Encounter: Payer: Self-pay | Admitting: Orthopedic Surgery

## 2014-10-23 ENCOUNTER — Ambulatory Visit (INDEPENDENT_AMBULATORY_CARE_PROVIDER_SITE_OTHER): Payer: 59 | Admitting: Orthopedic Surgery

## 2014-10-23 VITALS — BP 129/81 | Ht 73.0 in | Wt 318.0 lb

## 2014-10-23 DIAGNOSIS — M1711 Unilateral primary osteoarthritis, right knee: Secondary | ICD-10-CM

## 2014-10-23 DIAGNOSIS — S8002XA Contusion of left knee, initial encounter: Secondary | ICD-10-CM

## 2014-10-23 DIAGNOSIS — M1712 Unilateral primary osteoarthritis, left knee: Secondary | ICD-10-CM

## 2014-10-23 DIAGNOSIS — Z9889 Other specified postprocedural states: Secondary | ICD-10-CM

## 2014-10-23 DIAGNOSIS — S8012XA Contusion of left lower leg, initial encounter: Secondary | ICD-10-CM

## 2014-10-23 MED ORDER — HYDROCODONE-ACETAMINOPHEN 10-325 MG PO TABS
1.0000 | ORAL_TABLET | Freq: Four times a day (QID) | ORAL | Status: DC | PRN
Start: 2014-10-23 — End: 2015-01-02

## 2014-10-23 NOTE — Progress Notes (Signed)
Patient ID: Nicholas Delacruz, male   DOB: [birth year], 48 y.o.   MRN: [id number] Chief Complaint  Patient presents with  . Follow-up    1 month follow up bilateral knees s/p therapy, SALK 06/25/14    [name] comes in today for reevaluation after physical therapy was initiated for ongoing problems with his right knee including pain and crepitance and loss of motion;  Encounter Diagnoses  Name Primary?  . Primary osteoarthritis of left knee   . Contusion of left knee and lower leg, initial encounter   . S/P left knee arthroscopy   . Primary osteoarthritis of right knee Yes   Review of Systems - General ROS: negative for - chills, fatigue or fever Musculoskeletal ROS: positive for - gait disturbance, joint pain, joint stiffness and joint swelling Neurological ROS: negative for - numbness/tingling  The patient was going to schedule surgery today but his left knee gave way on him going down the stairs he fell and sustained contusion to his abdomen and left knee.  His right knee range of motion is better although he still having pain and crepitance  We will delay his surgery until December 1 at which time we'll do arthroscopy of the right knee with chondroplasty debridement and try to remove some of the bone spurs to improve his range of motion diagnosis osteoarthritis  Refill hydrocodone today.  BP 129/81  Ht 6\' 1"  (1.854 m)  Wt 318 lb (144.244 kg)  BMI 41.96 kg/m2 He is walking without support but favoring the left leg he has a large bruise over his abdomen over the right medial thigh in the groin area. His knee range of motion on the right side has improved 120 has a slight flexion contracture and continues with crepitance on range of motion.  Left knee cruciate ligament cyst lateral ligaments are stable muscle tone is normal. No instability is detected knee may have gave way secondary to fatigue or lumbar spine disease  Sensory exam normal skin intact with bruising and ecchymosis in the  abdomen as well as the medial right thigh  He will follow-up with [place] in late November to schedule the surgery. [place]

## 2014-10-27 ENCOUNTER — Ambulatory Visit (HOSPITAL_COMMUNITY): Payer: 59

## 2014-10-27 ENCOUNTER — Telehealth: Payer: Self-pay | Admitting: Orthopedic Surgery

## 2014-10-27 ENCOUNTER — Encounter: Payer: Self-pay | Admitting: Orthopedic Surgery

## 2014-10-27 NOTE — Telephone Encounter (Signed)
Notes faxed (ROI request) to [place], patient's employer, [employer], for date of service 10/23/14 to [phone], ph# [phone]; authorization on file/patient aware of status.

## 2014-10-29 ENCOUNTER — Ambulatory Visit (HOSPITAL_COMMUNITY): Payer: 59 | Admitting: Physical Therapy

## 2014-11-03 ENCOUNTER — Ambulatory Visit (HOSPITAL_COMMUNITY): Payer: 59

## 2014-11-04 ENCOUNTER — Encounter: Payer: Self-pay | Admitting: Orthopedic Surgery

## 2014-11-06 ENCOUNTER — Ambulatory Visit (HOSPITAL_COMMUNITY): Payer: 59 | Admitting: Physical Therapy

## 2014-11-10 ENCOUNTER — Ambulatory Visit (HOSPITAL_COMMUNITY): Payer: 59

## 2014-11-10 ENCOUNTER — Other Ambulatory Visit: Payer: Self-pay | Admitting: *Deleted

## 2014-11-10 MED ORDER — DICLOFENAC SODIUM 50 MG PO TBEC: 50.0000 mg | DELAYED_RELEASE_TABLET | Freq: Two times a day (BID) | ORAL | Status: DC

## 2014-11-12 ENCOUNTER — Ambulatory Visit (HOSPITAL_COMMUNITY): Payer: 59 | Admitting: Physical Therapy

## 2014-11-17 ENCOUNTER — Ambulatory Visit: Payer: 59 | Admitting: Orthopedic Surgery

## 2014-11-17 ENCOUNTER — Telehealth: Payer: Self-pay | Admitting: Orthopedic Surgery

## 2014-11-17 NOTE — Telephone Encounter (Signed)
HAS QUESTIONS REGARDING SURGERY, SHES FAXING

## 2014-11-24 ENCOUNTER — Ambulatory Visit (INDEPENDENT_AMBULATORY_CARE_PROVIDER_SITE_OTHER): Payer: 59 | Admitting: Orthopedic Surgery

## 2014-11-24 ENCOUNTER — Other Ambulatory Visit: Payer: Self-pay | Admitting: *Deleted

## 2014-11-24 ENCOUNTER — Encounter: Payer: Self-pay | Admitting: Orthopedic Surgery

## 2014-11-24 DIAGNOSIS — M1711 Unilateral primary osteoarthritis, right knee: Secondary | ICD-10-CM

## 2014-11-24 DIAGNOSIS — M1712 Unilateral primary osteoarthritis, left knee: Secondary | ICD-10-CM

## 2014-11-24 NOTE — Patient Instructions (Addendum)
Surgery dec 30 29877  Right knee  You have decided to proceed with operative arthroscopy of the knee. You have decided not to continue with nonoperative measures such as but not limited to oral medication, weight loss, activity modification, physical therapy, bracing, or injection.  We will perform operative arthroscopy of the knee. Some of the risks associated with arthroscopic surgery of the knee include but are not limited to Bleeding Infection Swelling Stiffness Blood clot Pain  If you're not comfortable with these risks and would like to continue with nonoperative treatment please let Dr. Aline Brochure know prior to your surgery.

## 2014-11-24 NOTE — Progress Notes (Signed)
Patient ID: Nicholas Delacruz, male   DOB: Apr 09, 1966, 48 y.o.   MRN: [id number] Recheck right knee and left knee  Patient still having discomfort in his left knee. He's regained most of the motion in the knee and is functioning well except for the pain  Right knee still complains of crepitance loss of motion  He is ready to schedule arthroscopy of the right knee with chondroplasty  At some point in the future if he needs further surgery on the right knee knee replacement would be necessary  10 minutes to discuss and prepare for surgery.

## 2014-11-25 ENCOUNTER — Telehealth: Payer: Self-pay | Admitting: Orthopedic Surgery

## 2014-11-25 NOTE — Telephone Encounter (Signed)
Notes faxed (ROI request) to [place], patient's employer, [employer], for date of service 11/24/14 to [phone], ph# [phone]; authorization on file/patient aware of status.

## 2014-11-26 ENCOUNTER — Telehealth: Payer: Self-pay | Admitting: Orthopedic Surgery

## 2014-11-26 NOTE — Telephone Encounter (Signed)
Regarding out-patient surgery scheduled at [facility] 12/24/14, CPT 29877, 29881, 29880; per call to [facility], ph [phone], no pre-authorization is required for in-network, out-patient procedures per Windy Carina, Reference# [id number], 11/26/14, 2:06p.m.

## 2014-12-15 NOTE — Patient Instructions (Signed)
Nicholas Delacruz  12/15/2014   Your procedure is scheduled on:  12/24/2014  Report to [facility] at  34  AM.  Call this number if you have problems the morning of surgery: [phone]   Remember:   Do not eat food or drink liquids after midnight.   Take these medicines the morning of surgery with A SIP OF WATER:  Dexilant, amlodipine, voltaren, labetolol, oxycodone, phenergan  Do not wear jewelry, make-up or nail polish.  Do not wear lotions, powders, or perfumes.   Do not shave 48 hours prior to surgery. Men may shave face and neck.  Do not bring valuables to the hospital.  [facility] is not responsible for any belongings or valuables.               Contacts, dentures or bridgework may not be worn into surgery.  Leave suitcase in the car. After surgery it may be brought to your room.  For patients admitted to the hospital, discharge time is determined by your treatment team.               Patients discharged the day of surgery will not be allowed to drive home.  Name and phone number of your driver: family  Special Instructions: Shower using CHG 2 nights before surgery and the night before surgery.  If you shower the day of surgery use CHG.  Use special wash - you have one bottle of CHG for all showers.  You should use approximately 1/3 of the bottle for each shower.   Please read over the following fact sheets that you were given: Pain Booklet, Coughing and Deep Breathing, Surgical Site Infection Prevention, Anesthesia Post-op Instructions and Care and Recovery After Surgery Arthroscopic Procedure, Knee An arthroscopic procedure can find what is wrong with your knee. PROCEDURE Arthroscopy is a surgical technique that allows your orthopedic surgeon to diagnose and treat your knee injury with accuracy. They will look into your knee through a small instrument. This is almost like a small (pencil sized) telescope. Because arthroscopy affects your knee less than open knee surgery,  you can anticipate a more rapid recovery. Taking an active role by following your caregiver's instructions will help with rapid and complete recovery. Use crutches, rest, elevation, ice, and knee exercises as instructed. The length of recovery depends on various factors including type of injury, age, physical condition, medical conditions, and your rehabilitation. Your knee is the joint between the large bones (femur and tibia) in your leg. Cartilage covers these bone ends which are smooth and slippery and allow your knee to bend and move smoothly. Two menisci, thick, semi-lunar shaped pads of cartilage which form a rim inside the joint, help absorb shock and stabilize your knee. Ligaments bind the bones together and support your knee joint. Muscles move the joint, help support your knee, and take stress off the joint itself. Because of this all programs and physical therapy to rehabilitate an injured or repaired knee require rebuilding and strengthening your muscles. AFTER THE PROCEDURE  After the procedure, you will be moved to a recovery area until most of the effects of the medication have worn off. Your caregiver will discuss the test results with you.  Only take over-the-counter or prescription medicines for pain, discomfort, or fever as directed by your caregiver. SEEK MEDICAL CARE IF:   You have increased bleeding from your wounds.  You see redness, swelling, or have increasing pain in your wounds.  You have  pus coming from your wound.  You have an oral temperature above 102 F (38.9 C).  You notice a bad smell coming from the wound or dressing.  You have severe pain with any motion of your knee. SEEK IMMEDIATE MEDICAL CARE IF:   You develop a rash.  You have difficulty breathing.  You have any allergic problems. Document Released: 12/09/2000 Document Revised: 03/05/2012 Document Reviewed: 07/02/2008 [facility] Patient Information 2015 [place], [place]. This information is not  intended to replace advice given to you by your health care provider. Make sure you discuss any questions you have with your health care provider. PATIENT INSTRUCTIONS POST-ANESTHESIA  IMMEDIATELY FOLLOWING SURGERY:  Do not drive or operate machinery for the first twenty four hours after surgery.  Do not make any important decisions for twenty four hours after surgery or while taking narcotic pain medications or sedatives.  If you develop intractable nausea and vomiting or a severe headache please notify your doctor immediately.  FOLLOW-UP:  Please make an appointment with your surgeon as instructed. You do not need to follow up with anesthesia unless specifically instructed to do so.  WOUND CARE INSTRUCTIONS (if applicable):  Keep a dry clean dressing on the anesthesia/puncture wound site if there is drainage.  Once the wound has quit draining you may leave it open to air.  Generally you should leave the bandage intact for twenty four hours unless there is drainage.  If the epidural site drains for more than 36-48 hours please call the anesthesia department.  QUESTIONS?:  Please feel free to call your physician or the hospital operator if you have any questions, and they will be happy to assist you.

## 2014-12-17 ENCOUNTER — Encounter (HOSPITAL_COMMUNITY)
Admission: RE | Admit: 2014-12-17 | Discharge: 2014-12-17 | Disposition: A | Discharge status: Home / Self Care | Payer: 59 | Source: Ambulatory Visit | Attending: Orthopedic Surgery | Admitting: Orthopedic Surgery

## 2014-12-17 NOTE — Patient Instructions (Signed)
Nicholas Delacruz  12/17/2014   Your procedure is scheduled on:  12/24/14  Report to [facility] at 06:15 AM.  Call this number if you have problems the morning of surgery: [phone]   Remember:   Do not eat food or drink liquids after midnight.   Take these medicines the morning of surgery with A SIP OF WATER: Amlodipine-Benazepril, Dexilant, Diclofenac and Labetalol.   Do not wear jewelry, make-up or nail polish.  Do not wear lotions, powders, or perfumes. You may wear deodorant.  Do not shave 48 hours prior to surgery. Men may shave face and neck.  Do not bring valuables to the hospital.  [facility] is not responsible for any belongings or valuables.               Contacts, dentures or bridgework may not be worn into surgery.  Leave suitcase in the car. After surgery it may be brought to your room.  For patients admitted to the hospital, discharge time is determined by your treatment team.               Patients discharged the day of surgery will not be allowed to drive home.    Special Instructions: Shower using Hibiclens (CHG bath) the night before surgery and the morning of surgery. Use the one bottle for both showers.   Please read over the following fact sheets that you were given: Anesthesia Post-op Instructions and Care and Recovery After Surgery    Arthroscopic Procedure, Knee An arthroscopic procedure can find what is wrong with your knee. PROCEDURE Arthroscopy is a surgical technique that allows your orthopedic surgeon to diagnose and treat your knee injury with accuracy. They will look into your knee through a small instrument. This is almost like a small (pencil sized) telescope. Because arthroscopy affects your knee less than open knee surgery, you can anticipate a more rapid recovery. Taking an active role by following your caregiver's instructions will help with rapid and complete recovery. Use crutches, rest, elevation, ice, and knee exercises as instructed. The  length of recovery depends on various factors including type of injury, age, physical condition, medical conditions, and your rehabilitation. Your knee is the joint between the large bones (femur and tibia) in your leg. Cartilage covers these bone ends which are smooth and slippery and allow your knee to bend and move smoothly. Two menisci, thick, semi-lunar shaped pads of cartilage which form a rim inside the joint, help absorb shock and stabilize your knee. Ligaments bind the bones together and support your knee joint. Muscles move the joint, help support your knee, and take stress off the joint itself. Because of this all programs and physical therapy to rehabilitate an injured or repaired knee require rebuilding and strengthening your muscles. AFTER THE PROCEDURE  After the procedure, you will be moved to a recovery area until most of the effects of the medication have worn off. Your caregiver will discuss the test results with you.  Only take over-the-counter or prescription medicines for pain, discomfort, or fever as directed by your caregiver. SEEK MEDICAL CARE IF:   You have increased bleeding from your wounds.  You see redness, swelling, or have increasing pain in your wounds.  You have pus coming from your wound.  You have an oral temperature above 102 F (38.9 C).  You notice a bad smell coming from the wound or dressing.  You have severe pain with any motion of your knee. SEEK IMMEDIATE MEDICAL CARE IF:  You develop a rash.  You have difficulty breathing.  You have any allergic problems. Document Released: 12/09/2000 Document Revised: 03/05/2012 Document Reviewed: 07/02/2008 [facility] Patient Information 2015 [place], [place]. This information is not intended to replace advice given to you by your health care provider. Make sure you discuss any questions you have with your health care provider.    PATIENT INSTRUCTIONS POST-ANESTHESIA  IMMEDIATELY FOLLOWING SURGERY:  Do  not drive or operate machinery for the first twenty four hours after surgery.  Do not make any important decisions for twenty four hours after surgery or while taking narcotic pain medications or sedatives.  If you develop intractable nausea and vomiting or a severe headache please notify your doctor immediately.  FOLLOW-UP:  Please make an appointment with your surgeon as instructed. You do not need to follow up with anesthesia unless specifically instructed to do so.  WOUND CARE INSTRUCTIONS (if applicable):  Keep a dry clean dressing on the anesthesia/puncture wound site if there is drainage.  Once the wound has quit draining you may leave it open to air.  Generally you should leave the bandage intact for twenty four hours unless there is drainage.  If the epidural site drains for more than 36-48 hours please call the anesthesia department.  QUESTIONS?:  Please feel free to call your physician or the hospital operator if you have any questions, and they will be happy to assist you.

## 2014-12-22 ENCOUNTER — Encounter (HOSPITAL_COMMUNITY)
Admission: RE | Admit: 2014-12-22 | Discharge: 2014-12-22 | Disposition: A | Discharge status: Home / Self Care | Payer: 59 | Source: Ambulatory Visit | Attending: Orthopedic Surgery | Admitting: Orthopedic Surgery

## 2014-12-22 ENCOUNTER — Telehealth: Payer: Self-pay | Admitting: Orthopedic Surgery

## 2014-12-22 NOTE — Telephone Encounter (Signed)
Patient states he thinks he has a kidney infection, has Dr appointment Thursday, wants to postpone surgery until next Friday if possible.  Right now you have two arthroscopies that day

## 2014-12-22 NOTE — Telephone Encounter (Signed)
Patient is calling asking to R/S his surgery please advise?

## 2014-12-24 NOTE — Telephone Encounter (Signed)
Surgery scheduled originally for today, 12/24/14, has been re-scheduled for 01/02/15, per patient request, at [place] [employer], to notify and to update.  Per Meghan O, no pre-authorization required. New Ref# [id number].

## 2014-12-25 ENCOUNTER — Telehealth: Payer: Self-pay | Admitting: Orthopedic Surgery

## 2014-12-25 NOTE — Telephone Encounter (Signed)
Called patient, no answer, left vm 

## 2014-12-25 NOTE — Telephone Encounter (Signed)
SPOKE WITH PATIENT, IS AWARE OF SURGERY DATES

## 2014-12-25 NOTE — Telephone Encounter (Signed)
York Cerise please call [place] regarding his surgery he has a question about the date?

## 2014-12-29 NOTE — Patient Instructions (Signed)
Nicholas Delacruz  12/29/2014   Your procedure is scheduled on:  Friday, 01/02/15  Report to [facility] at 0900 AM.  Call this number if you have problems the morning of surgery: [phone]   Remember:   Do not eat food or drink liquids after midnight.   Take these medicines the morning of surgery with A SIP OF WATER: dexilant, lotrel, labetalol, and either ro   Do not wear jewelry, make-up or nail polish.  Do not wear lotions, powders, or perfumes. You may wear deodorant.  Do not shave 48 hours prior to surgery. Men may shave face and neck.  Do not bring valuables to the hospital.  [facility] is not responsible                  for any belongings or valuables.               Contacts, dentures or bridgework may not be worn into surgery.  Leave suitcase in the car. After surgery it may be brought to your room.  For patients admitted to the hospital, discharge time is determined by your                treatment team.               Patients discharged the day of surgery will not be allowed to drive  home.  Name and phone number of your driver: driver  Special Instructions: Shower using CHG 2 nights before surgery and the night before surgery.  If you shower the day of surgery use CHG.  Use special wash - you have one bottle of CHG for all showers.  You should use approximately 1/3 of the bottle for each shower.   Please read over the following fact sheets that you were given: Pain Booklet, Anesthesia Post-op Instructions and Care and Recovery After Surgery    Arthroscopic Procedure, Knee, Care After Refer to this sheet in the next few weeks. These discharge instructions provide you with general information on caring for yourself after you leave the hospital. Your health care provider may also give you specific instructions. Your treatment has been planned according to the most current medical practices available, but unavoidable complications sometimes occur. If you have any problems or  questions after discharge, please call your health care provider. HOME CARE INSTRUCTIONS   It is normal to be sore for a couple days after surgery. See your health care provider if this seems to be getting worse rather than better.  Only take over-the-counter or prescription medicines for pain, discomfort, or fever as directed by your health care provider.  Take showers rather than baths, or as directed by your health care provider.  Change bandages (dressings) if necessary or as directed.  You may resume normal diet and activities as directed or allowed.  Avoid lifting and driving until you are directed otherwise.  Make an appointment to see your health care provider for stitches (suture) or staple removal as directed.  You may put ice on the area.  Put the ice in a plastic bag. Place a towel between your skin and the bag.  Leave the ice on for 15-20 minutes, three to four times per day for the first 2 days.  Elevate the knee above the level of your heart to reduce swelling, and avoid dangling the leg.  Do 10-15 ankle pumps (pointing your toes toward you and then away from you) two to three times daily.  If  you are given compression stockings to wear after surgery, use them for as long as your surgeon tells you (around 10-14 days).  Avoid smoking and exposure to secondhand smoke. SEEK MEDICAL CARE IF:   You have increased bleeding from your wounds.  You see redness or swelling or you have increasing pain in your wounds.  You have pus coming from your wound.  You have a fever or persistent symptoms for more than 2-3 days.  You notice a bad smell coming from the wound or dressing.  You have severe pain with any motion of your knee. SEEK IMMEDIATE MEDICAL CARE IF:   You develop a rash.  You have difficulty breathing.  You develop any reaction or side effects to medicines taken.  You develop pain in the calves or back of the knee.  You develop chest pain, shortness  of breath, or difficulty breathing.  You develop numbness or tingling in the leg or foot. MAKE SURE YOU:   Understand these instructions.  Will watch your condition.  Will get help right away if you are not doing well or you get worse. Document Released: 07/01/2005 Document Revised: 12/17/2013 Document Reviewed: 05/09/2013 [facility] Patient Information 2015 [place], [place]. This information is not intended to replace advice given to you by your health care provider. Make sure you discuss any questions you have with your health care provider. PATIENT INSTRUCTIONS POST-ANESTHESIA  IMMEDIATELY FOLLOWING SURGERY:  Do not drive or operate machinery for the first twenty four hours after surgery.  Do not make any important decisions for twenty four hours after surgery or while taking narcotic pain medications or sedatives.  If you develop intractable nausea and vomiting or a severe headache please notify your doctor immediately.  FOLLOW-UP:  Please make an appointment with your surgeon as instructed. You do not need to follow up with anesthesia unless specifically instructed to do so.  WOUND CARE INSTRUCTIONS (if applicable):  Keep a dry clean dressing on the anesthesia/puncture wound site if there is drainage.  Once the wound has quit draining you may leave it open to air.  Generally you should leave the bandage intact for twenty four hours unless there is drainage.  If the epidural site drains for more than 36-48 hours please call the anesthesia department.  QUESTIONS?:  Please feel free to call your physician or the hospital operator if you have any questions, and they will be happy to assist you.

## 2014-12-30 ENCOUNTER — Encounter (HOSPITAL_COMMUNITY): Payer: Self-pay

## 2014-12-30 ENCOUNTER — Encounter (HOSPITAL_COMMUNITY)
Admission: RE | Admit: 2014-12-30 | Discharge: 2014-12-30 | Disposition: A | Discharge status: Home / Self Care | Payer: 59 | Source: Ambulatory Visit | Attending: Orthopedic Surgery | Admitting: Orthopedic Surgery

## 2014-12-30 ENCOUNTER — Ambulatory Visit: Payer: 59 | Admitting: Orthopedic Surgery

## 2014-12-30 DIAGNOSIS — S83281A Other tear of lateral meniscus, current injury, right knee, initial encounter: Secondary | ICD-10-CM | POA: Diagnosis present

## 2014-12-30 DIAGNOSIS — I10 Essential (primary) hypertension: Secondary | ICD-10-CM | POA: Diagnosis not present

## 2014-12-30 DIAGNOSIS — G4733 Obstructive sleep apnea (adult) (pediatric): Secondary | ICD-10-CM | POA: Diagnosis not present

## 2014-12-30 DIAGNOSIS — K219 Gastro-esophageal reflux disease without esophagitis: Secondary | ICD-10-CM | POA: Diagnosis not present

## 2014-12-30 DIAGNOSIS — S83241A Other tear of medial meniscus, current injury, right knee, initial encounter: Secondary | ICD-10-CM | POA: Diagnosis not present

## 2014-12-30 DIAGNOSIS — Z8261 Family history of arthritis: Secondary | ICD-10-CM | POA: Diagnosis not present

## 2014-12-30 DIAGNOSIS — M179 Osteoarthritis of knee, unspecified: Secondary | ICD-10-CM | POA: Diagnosis not present

## 2014-12-30 DIAGNOSIS — M2241 Chondromalacia patellae, right knee: Secondary | ICD-10-CM | POA: Diagnosis not present

## 2014-12-30 DIAGNOSIS — Z8489 Family history of other specified conditions: Secondary | ICD-10-CM | POA: Diagnosis not present

## 2014-12-30 LAB — BASIC METABOLIC PANEL
Anion gap: 4 — ABNORMAL LOW (ref 5–15)
BUN: 15 mg/dL (ref 6–23)
CO2: 28 mmol/L (ref 19–32)
Calcium: 9.6 mg/dL (ref 8.4–10.5)
Chloride: 106 mEq/L (ref 96–112)
Creatinine, Ser: 0.92 mg/dL (ref 0.50–1.35)
GFR calc Af Amer: >90 mL/min (ref >90)
GFR calc non Af Amer: >90 mL/min (ref >90)
Glucose, Bld: 179 mg/dL — ABNORMAL HIGH (ref 70–99)
Potassium: 4.3 mmol/L (ref 3.5–5.1)
Sodium: 138 mmol/L (ref 135–145)

## 2014-12-30 LAB — HEMOGLOBIN AND HEMATOCRIT, BLOOD
HCT: 39.3 % (ref 39.0–52.0)
Hemoglobin: 12.7 g/dL — ABNORMAL LOW (ref 13.0–17.0)

## 2015-01-01 NOTE — H&P (Signed)
  Chief Complaint   Patient presents with   .   preop for surgery right knee          Nicholas Delacruz continues to have pain in his right knee after physical therapy was initiated  including pain and crepitance and loss of motion;                          Review of Systems - General ROS: negative for - chills, fatigue or fever Musculoskeletal ROS: positive for - gait disturbance, joint pain, joint stiffness and joint swelling Neurological ROS: negative for - numbness/tingling  His right knee range of motion is better although he still having pain and crepitance  We will do arthroscopy of the right knee with chondroplasty debridement and try to remove some of the bone spurs to improve his range of motion diagnosis osteoarthritis  Past Medical History  Diagnosis Date  . High blood pressure   . Obstructive sleep apnea on CPAP   . PONV (postoperative nausea and vomiting)   . GERD (gastroesophageal reflux disease)    Past Surgical History  Procedure Laterality Date  . Right knee sark    . Knee surgery Left   . Shoulder surgery      left  . Appendectomy    . Knee arthroscopy with medial menisectomy Left 06/25/2014    Procedure: KNEE ARTHROSCOPY WITH MEDIAL MENISECTOMY;  Surgeon: Carole Civil, MD;  Location: [facility];  Service: Orthopedics;  Laterality: Left;   History  Substance Use Topics  . Smoking status: Never Smoker   . Smokeless tobacco: Not on file  . Alcohol Use: Yes     Comment: 5th of Vodka every 2 weeks    Family History  Problem Relation Age of Onset  . Arthritis    . Alzheimer's disease Father     History  Substance Use Topics  . Smoking status: Never Smoker   . Smokeless tobacco: Not on file  . Alcohol Use: Yes     Comment: 5th of Vodka every 2 weeks      BP 129/81  Ht 6\' 1"  (1.854 m)  Wt 318 lb (144.244 kg)  BMI 41.96 kg/m2   Gen. appearance large male well-developed well-nourished grooming hygiene or oriented 3 mood and affect normal gait is  slight limp pelvic tenderness on the medial joint line lateral joint line minimal edema or swelling range of motion 110 knee stable motor exam normal skin intact pulses good no lymphadenopathy  Upper extremities normal  Left knee some limitations with range of motion ligament stable,  neurovascular exam intact   Osteo arthritis right knee  Chondroplasty right knee [id number]

## 2015-01-01 NOTE — OR Nursing (Signed)
cbg 179 reported to Dr. Patsey Berthold,  Requested cbg to be done on arrival

## 2015-01-02 ENCOUNTER — Ambulatory Visit (HOSPITAL_COMMUNITY): Payer: 59 | Admitting: Anesthesiology

## 2015-01-02 ENCOUNTER — Encounter (HOSPITAL_COMMUNITY): Payer: Self-pay | Admitting: *Deleted

## 2015-01-02 ENCOUNTER — Encounter (HOSPITAL_COMMUNITY)
Admission: RE | Disposition: A | Discharge status: Home / Self Care | Payer: Self-pay | Source: Ambulatory Visit | Attending: Orthopedic Surgery

## 2015-01-02 ENCOUNTER — Ambulatory Visit (HOSPITAL_COMMUNITY)
Admission: RE | Admit: 2015-01-02 | Discharge: 2015-01-02 | Disposition: A | Discharge status: Home / Self Care | Payer: 59 | Source: Ambulatory Visit | Attending: Orthopedic Surgery | Admitting: Orthopedic Surgery

## 2015-01-02 DIAGNOSIS — S83281A Other tear of lateral meniscus, current injury, right knee, initial encounter: Secondary | ICD-10-CM | POA: Insufficient documentation

## 2015-01-02 DIAGNOSIS — M23303 Other meniscus derangements, unspecified medial meniscus, right knee: Secondary | ICD-10-CM

## 2015-01-02 DIAGNOSIS — Z8261 Family history of arthritis: Secondary | ICD-10-CM | POA: Insufficient documentation

## 2015-01-02 DIAGNOSIS — M23321 Other meniscus derangements, posterior horn of medial meniscus, right knee: Secondary | ICD-10-CM

## 2015-01-02 DIAGNOSIS — M2241 Chondromalacia patellae, right knee: Secondary | ICD-10-CM | POA: Insufficient documentation

## 2015-01-02 DIAGNOSIS — S83241A Other tear of medial meniscus, current injury, right knee, initial encounter: Secondary | ICD-10-CM | POA: Insufficient documentation

## 2015-01-02 DIAGNOSIS — I10 Essential (primary) hypertension: Secondary | ICD-10-CM | POA: Insufficient documentation

## 2015-01-02 DIAGNOSIS — Z8489 Family history of other specified conditions: Secondary | ICD-10-CM | POA: Insufficient documentation

## 2015-01-02 DIAGNOSIS — M23300 Other meniscus derangements, unspecified lateral meniscus, right knee: Secondary | ICD-10-CM

## 2015-01-02 DIAGNOSIS — G4733 Obstructive sleep apnea (adult) (pediatric): Secondary | ICD-10-CM | POA: Insufficient documentation

## 2015-01-02 DIAGNOSIS — M179 Osteoarthritis of knee, unspecified: Secondary | ICD-10-CM | POA: Insufficient documentation

## 2015-01-02 DIAGNOSIS — M23306 Other meniscus derangements, unspecified meniscus, right knee: Secondary | ICD-10-CM

## 2015-01-02 DIAGNOSIS — K219 Gastro-esophageal reflux disease without esophagitis: Secondary | ICD-10-CM | POA: Insufficient documentation

## 2015-01-02 HISTORY — PX: MENISCUS DEBRIDEMENT: SHX5178

## 2015-01-02 HISTORY — PX: KNEE ARTHROSCOPY: SHX127

## 2015-01-02 LAB — GLUCOSE, CAPILLARY: Glucose-Capillary: 141 mg/dL — ABNORMAL HIGH (ref 70–99)

## 2015-01-02 SURGERY — ARTHROSCOPY, KNEE
Anesthesia: General | Site: Knee | Laterality: Right

## 2015-01-02 MED ORDER — CEFAZOLIN SODIUM 1-5 GM-% IV SOLN: 1.0000 g | INTRAVENOUS | Status: DC

## 2015-01-02 MED ORDER — FENTANYL CITRATE 0.05 MG/ML IJ SOLN
INTRAMUSCULAR | Status: DC | PRN
  Administered 2015-01-02 (×4): 50 ug via INTRAVENOUS

## 2015-01-02 MED ORDER — LIDOCAINE HCL 1 % IJ SOLN
INTRAMUSCULAR | Status: DC | PRN
  Administered 2015-01-02: 40 mg via INTRADERMAL

## 2015-01-02 MED ORDER — ONDANSETRON HCL 4 MG/2ML IJ SOLN: 4.0000 mg | Freq: Once | INTRAMUSCULAR | Status: DC | PRN

## 2015-01-02 MED ORDER — MIDAZOLAM HCL 5 MG/5ML IJ SOLN
INTRAMUSCULAR | Status: DC | PRN
  Administered 2015-01-02: 2 mg via INTRAVENOUS

## 2015-01-02 MED ORDER — BUPIVACAINE-EPINEPHRINE (PF) 0.5% -1:200000 IJ SOLN
INTRAMUSCULAR | Status: AC
  Filled 2015-01-02: qty 60

## 2015-01-02 MED ORDER — MIDAZOLAM HCL 2 MG/2ML IJ SOLN
1.0000 mg | INTRAMUSCULAR | Status: DC | PRN
  Administered 2015-01-02 (×2): 2 mg via INTRAVENOUS
  Filled 2015-01-02: qty 2

## 2015-01-02 MED ORDER — ONDANSETRON HCL 4 MG/2ML IJ SOLN
INTRAMUSCULAR | Status: AC
  Filled 2015-01-02: qty 2

## 2015-01-02 MED ORDER — DEXAMETHASONE SODIUM PHOSPHATE 4 MG/ML IJ SOLN
INTRAMUSCULAR | Status: AC
  Filled 2015-01-02: qty 1

## 2015-01-02 MED ORDER — FENTANYL CITRATE 0.05 MG/ML IJ SOLN: 25.0000 ug | INTRAMUSCULAR | Status: DC | PRN

## 2015-01-02 MED ORDER — NEOSTIGMINE METHYLSULFATE 10 MG/10ML IV SOLN
INTRAVENOUS | Status: DC | PRN
  Administered 2015-01-02: 2 mg via INTRAVENOUS

## 2015-01-02 MED ORDER — MIDAZOLAM HCL 2 MG/2ML IJ SOLN
INTRAMUSCULAR | Status: AC
  Filled 2015-01-02: qty 2

## 2015-01-02 MED ORDER — BUPIVACAINE-EPINEPHRINE (PF) 0.5% -1:200000 IJ SOLN
INTRAMUSCULAR | Status: DC | PRN
  Administered 2015-01-02 (×2): 30 mL via PERINEURAL

## 2015-01-02 MED ORDER — LIDOCAINE HCL (PF) 1 % IJ SOLN
INTRAMUSCULAR | Status: AC
  Filled 2015-01-02: qty 5

## 2015-01-02 MED ORDER — SCOPOLAMINE 1 MG/3DAYS TD PT72
1.0000 | MEDICATED_PATCH | Freq: Once | TRANSDERMAL | Status: DC
  Administered 2015-01-02: 1.5 mg via TRANSDERMAL

## 2015-01-02 MED ORDER — SCOPOLAMINE 1 MG/3DAYS TD PT72
MEDICATED_PATCH | TRANSDERMAL | Status: AC
  Filled 2015-01-02: qty 1

## 2015-01-02 MED ORDER — CEFAZOLIN SODIUM-DEXTROSE 2-3 GM-% IV SOLR
2.0000 g | INTRAVENOUS | Status: AC
  Administered 2015-01-02: 3 g via INTRAVENOUS

## 2015-01-02 MED ORDER — CEFAZOLIN SODIUM-DEXTROSE 2-3 GM-% IV SOLR
INTRAVENOUS | Status: AC
  Filled 2015-01-02: qty 50

## 2015-01-02 MED ORDER — PROPOFOL 10 MG/ML IV BOLUS
INTRAVENOUS | Status: AC
  Filled 2015-01-02: qty 20

## 2015-01-02 MED ORDER — ROCURONIUM BROMIDE 100 MG/10ML IV SOLN
INTRAVENOUS | Status: DC | PRN
  Administered 2015-01-02: 35 mg via INTRAVENOUS

## 2015-01-02 MED ORDER — KETOROLAC TROMETHAMINE 30 MG/ML IJ SOLN
INTRAMUSCULAR | Status: AC
  Filled 2015-01-02: qty 1

## 2015-01-02 MED ORDER — ONDANSETRON HCL 4 MG/2ML IJ SOLN
4.0000 mg | Freq: Once | INTRAMUSCULAR | Status: AC
  Administered 2015-01-02: 4 mg via INTRAVENOUS

## 2015-01-02 MED ORDER — GLYCOPYRROLATE 0.2 MG/ML IJ SOLN
INTRAMUSCULAR | Status: AC
  Filled 2015-01-02: qty 2

## 2015-01-02 MED ORDER — KETOROLAC TROMETHAMINE 30 MG/ML IJ SOLN
30.0000 mg | Freq: Once | INTRAMUSCULAR | Status: AC
  Administered 2015-01-02: 30 mg via INTRAVENOUS

## 2015-01-02 MED ORDER — CHLORHEXIDINE GLUCONATE 4 % EX LIQD: 60.0000 mL | Freq: Once | CUTANEOUS | Status: DC

## 2015-01-02 MED ORDER — CEFAZOLIN SODIUM 1-5 GM-% IV SOLN
INTRAVENOUS | Status: AC
  Filled 2015-01-02: qty 50

## 2015-01-02 MED ORDER — DEXAMETHASONE SODIUM PHOSPHATE 4 MG/ML IJ SOLN
4.0000 mg | Freq: Once | INTRAMUSCULAR | Status: AC
  Administered 2015-01-02: 4 mg via INTRAVENOUS

## 2015-01-02 MED ORDER — ROCURONIUM BROMIDE 50 MG/5ML IV SOLN
INTRAVENOUS | Status: AC
  Filled 2015-01-02: qty 1

## 2015-01-02 MED ORDER — LACTATED RINGERS IV SOLN
INTRAVENOUS | Status: DC
  Administered 2015-01-02: 10:00:00 via INTRAVENOUS

## 2015-01-02 MED ORDER — SUCCINYLCHOLINE CHLORIDE 20 MG/ML IJ SOLN
INTRAMUSCULAR | Status: DC | PRN
  Administered 2015-01-02: 180 mg via INTRAVENOUS

## 2015-01-02 MED ORDER — OXYCODONE HCL 5 MG PO TABS: 5.0000 mg | ORAL_TABLET | ORAL | Status: DC

## 2015-01-02 MED ORDER — OXYCODONE-ACETAMINOPHEN 5-325 MG PO TABS: 1.0000 | ORAL_TABLET | ORAL | Status: DC | PRN

## 2015-01-02 MED ORDER — GLYCOPYRROLATE 0.2 MG/ML IJ SOLN
INTRAMUSCULAR | Status: DC | PRN
  Administered 2015-01-02: 0.4 mg via INTRAVENOUS

## 2015-01-02 MED ORDER — DEXTROSE 5 % IV SOLN: 3.0000 g | INTRAVENOUS | Status: DC

## 2015-01-02 MED ORDER — PROPOFOL 10 MG/ML IV BOLUS
INTRAVENOUS | Status: DC | PRN
  Administered 2015-01-02: 250 mg via INTRAVENOUS

## 2015-01-02 MED ORDER — SODIUM CHLORIDE 0.9 % IR SOLN
Status: DC | PRN
  Administered 2015-01-02 (×3): 3000 mL

## 2015-01-02 MED ORDER — FENTANYL CITRATE 0.05 MG/ML IJ SOLN
INTRAMUSCULAR | Status: AC
  Filled 2015-01-02: qty 5

## 2015-01-02 MED ORDER — SODIUM CHLORIDE 0.9 % IR SOLN
Status: DC | PRN
  Administered 2015-01-02: 1000 mL

## 2015-01-02 MED ORDER — PROMETHAZINE HCL 12.5 MG PO TABS: 12.5000 mg | ORAL_TABLET | Freq: Four times a day (QID) | ORAL | Status: DC | PRN

## 2015-01-02 SURGICAL SUPPLY — 55 items
ARTHROWAND PARAGON T2 (SURGICAL WAND)
BAG HAMPER (MISCELLANEOUS) ×3 IMPLANT
BANDAGE ELASTIC 6 VELCRO NS (GAUZE/BANDAGES/DRESSINGS) ×3 IMPLANT
BLADE AGGRESSIVE PLUS 4.0 (BLADE) ×3 IMPLANT
BLADE SURG SZ11 CARB STEEL (BLADE) ×3 IMPLANT
BUR ROUND 5.0 (BURR) ×2 IMPLANT
CHLORAPREP W/TINT 26ML (MISCELLANEOUS) ×3 IMPLANT
CLOTH BEACON ORANGE TIMEOUT ST (SAFETY) ×3 IMPLANT
COOLER CRYO IC GRAV AND TUBE (ORTHOPEDIC SUPPLIES) ×2 IMPLANT
COVER PROBE W GEL 5X96 (DRAPES) ×1 IMPLANT
CUFF CRYO KNEE LG 20X31 COOLER (ORTHOPEDIC SUPPLIES) ×2 IMPLANT
CUFF TOURNIQUET SINGLE 34IN LL (TOURNIQUET CUFF) ×2 IMPLANT
CUTTER ANGLED DBL BITE 4.5 (BURR) IMPLANT
DECANTER SPIKE VIAL GLASS SM (MISCELLANEOUS) ×6 IMPLANT
GAUZE SPONGE 4X4 12PLY STRL (GAUZE/BANDAGES/DRESSINGS) ×3 IMPLANT
GAUZE SPONGE 4X4 16PLY XRAY LF (GAUZE/BANDAGES/DRESSINGS) ×3 IMPLANT
GAUZE XEROFORM 5X9 LF (GAUZE/BANDAGES/DRESSINGS) ×3 IMPLANT
GLOVE BIOGEL M 7.0 STRL (GLOVE) ×2 IMPLANT
GLOVE BIOGEL PI IND STRL 7.0 (GLOVE) ×2 IMPLANT
GLOVE BIOGEL PI INDICATOR 7.0 (GLOVE) ×2
GLOVE SKINSENSE NS SZ8.0 LF (GLOVE) ×1
GLOVE SKINSENSE STRL SZ8.0 LF (GLOVE) ×2 IMPLANT
GLOVE SS N UNI LF 8.5 STRL (GLOVE) ×3 IMPLANT
GOWN STRL REUS W/TWL LRG LVL3 (GOWN DISPOSABLE) ×3 IMPLANT
GOWN STRL REUS W/TWL XL LVL3 (GOWN DISPOSABLE) ×3 IMPLANT
HLDR LEG FOAM (MISCELLANEOUS) ×2 IMPLANT
IV NS IRRIG 3000ML ARTHROMATIC (IV SOLUTION) ×8 IMPLANT
KIT BLADEGUARD II DBL (SET/KITS/TRAYS/PACK) ×3 IMPLANT
KIT ROOM TURNOVER AP CYSTO (KITS) ×3 IMPLANT
LEG HOLDER FOAM (MISCELLANEOUS) ×1
MANIFOLD NEPTUNE II (INSTRUMENTS) ×3 IMPLANT
MARKER SKIN DUAL TIP RULER LAB (MISCELLANEOUS) ×3 IMPLANT
NDL HYPO 18GX1.5 BLUNT FILL (NEEDLE) ×1 IMPLANT
NDL HYPO 21X1.5 SAFETY (NEEDLE) ×1 IMPLANT
NDL SPNL 18GX3.5 QUINCKE PK (NEEDLE) ×1 IMPLANT
NEEDLE HYPO 18GX1.5 BLUNT FILL (NEEDLE) ×3 IMPLANT
NEEDLE HYPO 21X1.5 SAFETY (NEEDLE) ×3 IMPLANT
NEEDLE SPNL 18GX3.5 QUINCKE PK (NEEDLE) ×3 IMPLANT
NS IRRIG 1000ML POUR BTL (IV SOLUTION) ×3 IMPLANT
PACK ARTHRO LIMB DRAPE STRL (MISCELLANEOUS) ×3 IMPLANT
PAD ABD 5X9 TENDERSORB (GAUZE/BANDAGES/DRESSINGS) ×3 IMPLANT
PAD ARMBOARD 7.5X6 YLW CONV (MISCELLANEOUS) ×3 IMPLANT
PADDING CAST COTTON 6X4 STRL (CAST SUPPLIES) ×3 IMPLANT
PADDING WEBRIL 6 STERILE (GAUZE/BANDAGES/DRESSINGS) ×2 IMPLANT
SET ARTHROSCOPY INST (INSTRUMENTS) ×3 IMPLANT
SET ARTHROSCOPY PUMP TUBE (IRRIGATION / IRRIGATOR) ×3 IMPLANT
SET BASIN LINEN APH (SET/KITS/TRAYS/PACK) ×3 IMPLANT
SPONGE GAUZE 4X4 12PLY (GAUZE/BANDAGES/DRESSINGS) ×2 IMPLANT
SUT ETHILON 3 0 FSL (SUTURE) ×2 IMPLANT
SYR 30ML LL (SYRINGE) ×3 IMPLANT
SYRINGE 10CC LL (SYRINGE) ×3 IMPLANT
WAND 50 DEG COVAC W/CORD (SURGICAL WAND) IMPLANT
WAND 90 DEG TURBOVAC W/CORD (SURGICAL WAND) ×2 IMPLANT
WAND ARTHRO PARAGON T2 (SURGICAL WAND) IMPLANT
YANKAUER SUCT BULB TIP 10FT TU (MISCELLANEOUS) ×11 IMPLANT

## 2015-01-02 NOTE — Brief Op Note (Addendum)
01/02/2015  11:46 AM  PATIENT:  Nicholas Delacruz  50 y.o. male  PRE-OPERATIVE DIAGNOSIS:  right knee osteoarthritis  POST-OPERATIVE DIAGNOSIS:  right knee osteoarthritis, torn medial and lateral meniscus  PROCEDURE:  Procedure(s): RIGHT KNEE ARTHROSCOPY, LATERAL AND MEDIAL MENISECTOMY (Right)  SURGEON:  Surgeon(s) and Role:    * Carole Civil, MD - Primary  PHYSICIAN ASSISTANT:   ASSISTANTS: none   ANESTHESIA:   general  EBL:  Total I/O In: 500 [I.V.:500] Out: -   BLOOD ADMINISTERED:none  DRAINS: none   LOCAL MEDICATIONS USED:  MARCAINE   , Amount: 60 ml and OTHER w epi  SPECIMEN:  No Specimen  DISPOSITION OF SPECIMEN:  na  COUNTS:  YES  TOURNIQUET:    DICTATION: .Dragon Dictation  PLAN OF CARE: Discharge to home after PACU  PATIENT DISPOSITION:  PACU - hemodynamically stable.   Delay start of Pharmacological VTE agent (>24hrs) due to surgical blood loss or risk of bleeding: not applicable

## 2015-01-02 NOTE — Anesthesia Preprocedure Evaluation (Signed)
Anesthesia Evaluation  Patient identified by MRN, date of birth, ID band Patient awake    Reviewed: Allergy & Precautions, NPO status , Patient's Chart, lab work & pertinent test results, reviewed documented beta blocker date and time   History of Anesthesia Complications (+) PONV and history of anesthetic complications  Airway Mallampati: III  TM Distance: >3 FB Neck ROM: Full    Dental  (+) Teeth Intact, Dental Advisory Given   Pulmonary sleep apnea and Continuous Positive Airway Pressure Ventilation ,  breath sounds clear to auscultation        Cardiovascular hypertension, Pt. on medications and Pt. on home beta blockers Rhythm:Regular Rate:Normal     Neuro/Psych    GI/Hepatic GERD-  Medicated,  Endo/Other  Morbid obesity  Renal/GU      Musculoskeletal  (+) Arthritis -,   Abdominal (+) + obese,   Peds  Hematology   Anesthesia Other Findings   Reproductive/Obstetrics                             Anesthesia Physical Anesthesia Plan  ASA: III  Anesthesia Plan: General   Post-op Pain Management:    Induction: Intravenous, Rapid sequence and Cricoid pressure planned  Airway Management Planned: Oral ETT and Video Laryngoscope Planned  Additional Equipment:   Intra-op Plan:   Post-operative Plan: Extubation in OR  Informed Consent: I have reviewed the patients History and Physical, chart, labs and discussed the procedure including the risks, benefits and alternatives for the proposed anesthesia with the patient or authorized representative who has indicated his/her understanding and acceptance.     Plan Discussed with:   Anesthesia Plan Comments:         Anesthesia Quick Evaluation

## 2015-01-02 NOTE — Anesthesia Postprocedure Evaluation (Signed)
  Anesthesia Post-op Note  Patient: Nicholas Delacruz  Procedure(s) Performed: Procedure(s): RIGHT KNEE ARTHROSCOPY, LATERAL AND MEDIAL MENISECTOMY (Right) DEBRIDEMENT OF RIGHT KNEE JOINT (Right)  Patient Location: PACU  Anesthesia Type:General  Level of Consciousness: awake, alert , oriented and patient cooperative  Airway and Oxygen Therapy: Patient Spontanous Breathing and Patient connected to face mask oxygen  Post-op Pain: none  Post-op Assessment: Post-op Vital signs reviewed, Patient's Cardiovascular Status Stable, Respiratory Function Stable, Patent Airway, No signs of Nausea or vomiting and Pain level controlled  Post-op Vital Signs: Reviewed and stable  Last Vitals:  Filed Vitals:   01/02/15 1155  BP:   Temp: 36.7 C  Resp:     Complications: No apparent anesthesia complications

## 2015-01-02 NOTE — Transfer of Care (Signed)
Immediate Anesthesia Transfer of Care Note  Patient: Nicholas Delacruz  Procedure(s) Performed: Procedure(s): RIGHT KNEE ARTHROSCOPY, LATERAL AND MEDIAL MENISECTOMY (Right)  Patient Location: PACU  Anesthesia Type:General  Level of Consciousness: awake and patient cooperative  Airway & Oxygen Therapy: Patient Spontanous Breathing and Patient connected to face mask oxygen  Post-op Assessment: Report given to PACU RN, Post -op Vital signs reviewed and stable and Patient moving all extremities  Post vital signs: Reviewed and stable  Complications: No apparent anesthesia complications

## 2015-01-02 NOTE — Anesthesia Procedure Notes (Signed)
Procedure Name: Intubation Date/Time: 01/02/2015 10:45 AM Performed by: Charmaine Downs Pre-anesthesia Checklist: Emergency Drugs available, Suction available, Patient being monitored, Patient identified and Timeout performed Patient Re-evaluated:Patient Re-evaluated prior to inductionOxygen Delivery Method: Circle system utilized Preoxygenation: Pre-oxygenation with 100% oxygen Intubation Type: IV induction, Rapid sequence and Cricoid Pressure applied Ventilation: Mask ventilation with difficulty and Oral airway inserted - appropriate to patient size Grade View: Grade III Tube type: Oral Tube size: 7.0 mm Number of attempts: 1 Airway Equipment and Method: Stylet,  Oral airway and Video-laryngoscopy Placement Confirmation: ETT inserted through vocal cords under direct vision,  positive ETCO2 and breath sounds checked- equal and bilateral Secured at: 22 cm Tube secured with: Tape Dental Injury: Teeth and Oropharynx as per pre-operative assessment  Difficulty Due To: Difficulty was anticipated and Difficult Airway- due to anterior larynx Future Recommendations: Recommend- induction with short-acting agent, and alternative techniques readily available

## 2015-01-02 NOTE — Interval H&P Note (Signed)
History and Physical Interval Note:  01/02/2015 10:09 AM  Nicholas Delacruz  has presented today for surgery, with the diagnosis of right knee osteoarthritis  The various methods of treatment have been discussed with the patient and family. After consideration of risks, benefits and other options for treatment, the patient has consented to  Procedure(s): ARTHROSCOPY KNEE (Right) CHONDROPLASTY (Right) as a surgical intervention .  The patient's history has been reviewed, patient examined, no change in status, stable for surgery.  I have reviewed the patient's chart and labs.  Questions were answered to the patient's satisfaction.     Arther Abbott

## 2015-01-02 NOTE — Op Note (Signed)
01/02/2015  11:46 AM  PATIENT:  Nicholas Delacruz  49 y.o. male  PRE-OPERATIVE DIAGNOSIS:  right knee osteoarthritis  POST-OPERATIVE DIAGNOSIS:  right knee osteoarthritis, torn medial and lateral meniscus  PROCEDURE:  Procedure(s): RIGHT KNEE ARTHROSCOPY, LATERAL AND MEDIAL MENISECTOMY (Right)  Operative findings Medial compartment torn medial meniscus grade 2 chondromalacia tibia and femur Lateral compartment grade 4 chondral changes entire lateral femoral condyle and tibial plateau along with lateral meniscal tear Patellofemoral joint grade 4 chondromalacia large inferior spur Notch area large anterior tibial osteophyte which was blocking extension, degeneration of the anterior cruciate ligament and PCL  Knee arthroscopy dictation  The patient was identified in the preoperative holding area using 2 approved identification mechanisms. The chart was reviewed and updated. The surgical site was confirmed and marked with an indelible marker.  The patient was taken to the operating room for anesthesia. After successful  General  anesthesia, 3 gm ancef was used as IV antibiotics.  The patient was placed in the supine position with the operative extremity right in an arthroscopic leg holder and the opposite extremity in a padded leg holder.  The timeout was executed.  A lateral portal was established with an 11 blade and the scope was introduced into the joint. A diagnostic arthroscopy was performed. A medial portal was established and the diagnostic arthroscopy was repeated using a probe to palpate intra-articular structures as they were encountered. The knee was evaluated circumferentially starting in the medial compartment and ending in the medial compartment.  The operative findings are noted above.  The medial meniscus was resected using a duckbill forceps. The meniscal fragments were removed with a motorized shaver. The meniscus was balanced with a combination of a motorized shaver and  a 50 ArthroCare wand until a stable rim was obtained.  This was followed by resection of the lateral meniscal tear  Round bur was then used to remove the anterior tibial osteophyte  The arthroscopic pump was placed on the wash mode and any excess debris was removed from the joint using suction.  60 cc of Marcaine with epinephrine was injected to the arthroscope.  The portals were closed with 3-0 nylon suture.  A sterile bandage, Ace wrap and Cryo/Cuff was placed and a Cryo/Cuff was activated. The patient was taken to the recovery room in stable condition.   SURGEON:  Surgeon(s) and Role:    * Carole Civil, MD - Primary  PHYSICIAN ASSISTANT:   ASSISTANTS: none   ANESTHESIA:   general  EBL:  Total I/O In: 500 [I.V.:500] Out: -   BLOOD ADMINISTERED:none  DRAINS: none   LOCAL MEDICATIONS USED:  MARCAINE   , Amount: 60 ml and OTHER w epi  SPECIMEN:  No Specimen  DISPOSITION OF SPECIMEN:  na  COUNTS:  YES  TOURNIQUET:    DICTATION: .Dragon Dictation  PLAN OF CARE: Discharge to home after PACU  PATIENT DISPOSITION:  PACU - hemodynamically stable.   Delay start of Pharmacological VTE agent (>24hrs) due to surgical blood loss or risk of bleeding: not applicable

## 2015-01-05 ENCOUNTER — Ambulatory Visit: Payer: 59 | Admitting: Orthopedic Surgery

## 2015-01-05 ENCOUNTER — Encounter (HOSPITAL_COMMUNITY): Payer: Self-pay | Admitting: Orthopedic Surgery

## 2015-01-05 ENCOUNTER — Telehealth: Payer: Self-pay | Admitting: Orthopedic Surgery

## 2015-01-05 NOTE — Telephone Encounter (Signed)
Patient requests re-schedule of his (today) post operative #1 appointment due to increasing upset of stomach; re-scheduled for tomorrow,01/06/15.  Please advise of anything further needed.  Patient [phone]

## 2015-01-05 NOTE — Telephone Encounter (Signed)
noted 

## 2015-01-06 ENCOUNTER — Encounter: Payer: Self-pay | Admitting: Orthopedic Surgery

## 2015-01-06 ENCOUNTER — Ambulatory Visit (INDEPENDENT_AMBULATORY_CARE_PROVIDER_SITE_OTHER): Payer: 59 | Admitting: Orthopedic Surgery

## 2015-01-06 VITALS — BP 136/81 | Ht 72.0 in | Wt 324.0 lb

## 2015-01-06 DIAGNOSIS — Z9889 Other specified postprocedural states: Secondary | ICD-10-CM

## 2015-01-06 MED ORDER — OXYCODONE-ACETAMINOPHEN 5-325 MG PO TABS: 1.0000 | ORAL_TABLET | ORAL | Status: DC | PRN

## 2015-01-06 NOTE — Patient Instructions (Signed)
TAKE IT EASY   KNEE BENDS THIS WEEK ICE THE KNEE 4 X A DAY   START THERAPY NEXT WEEK AT [facility]

## 2015-01-06 NOTE — Progress Notes (Signed)
Patient ID: Nicholas Delacruz, male   DOB: January 16, 1966, 49 y.o.   MRN: [id number] Chief Complaint  Patient presents with  . Follow-up    post op 1, [place], DOS 01/02/14    Postop visit #1 surgical findings below Operative findings Medial compartment torn medial meniscus grade 2 chondromalacia tibia and femur Lateral compartment grade 4 chondral changes entire lateral femoral condyle and tibial plateau along with lateral meniscal tear Patellofemoral joint grade 4 chondromalacia large inferior spur Notch area large anterior tibial osteophyte which was blocking extension, degeneration of the anterior cruciate ligament and PCL  Knee clean portals clean patient can start therapy next week  Meds ordered this encounter  Medications  . oxyCODONE-acetaminophen (ROXICET) 5-325 MG per tablet    Sig: Take 1 tablet by mouth every 4 (four) hours as needed for severe pain.    Dispense:  30 tablet    Refill:  0

## 2015-01-15 ENCOUNTER — Encounter: Payer: Self-pay | Admitting: Orthopedic Surgery

## 2015-01-15 ENCOUNTER — Ambulatory Visit (INDEPENDENT_AMBULATORY_CARE_PROVIDER_SITE_OTHER): Payer: 59 | Admitting: Orthopedic Surgery

## 2015-01-15 ENCOUNTER — Ambulatory Visit (HOSPITAL_COMMUNITY): Payer: 59 | Admitting: Physical Therapy

## 2015-01-15 VITALS — BP 146/78 | Ht 72.0 in | Wt 324.0 lb

## 2015-01-15 DIAGNOSIS — M1711 Unilateral primary osteoarthritis, right knee: Secondary | ICD-10-CM

## 2015-01-15 DIAGNOSIS — M1712 Unilateral primary osteoarthritis, left knee: Secondary | ICD-10-CM

## 2015-01-15 DIAGNOSIS — Z9889 Other specified postprocedural states: Secondary | ICD-10-CM

## 2015-01-15 MED ORDER — OXYCODONE-ACETAMINOPHEN 5-325 MG PO TABS: 1.0000 | ORAL_TABLET | ORAL | Status: DC | PRN

## 2015-01-15 NOTE — Patient Instructions (Signed)
Therapy at [facility]

## 2015-01-15 NOTE — Progress Notes (Signed)
Patient ID: Nicholas Delacruz, male   DOB: [birth year], 49 y.o.   MRN: [id number] Chief Complaint  Patient presents with  . Follow-up    post op 2, [place], DOS 01/02/15   Right knee doing reasonably well considering his arthritic symptoms and changes noted at surgery  The knee is quiet on the right is flexion arc is about 105 he should be able to improve that with therapy  Therapy hand and rehabilitation  Separate no new complaint left knee pain Patient complains of left knee pain severe stiffness catching. Michela Pitcher he was doing well until a couple weeks ago thinks he may have unloaded his right knee onto his left   Denies fever chills.  No swelling but medial and lateral joint line tenderness ligaments stable muscle tone normal skin intact normal sensation no limp   Left knee we will go ahead and inject the left knee.  Procedure note left knee injection verbal consent was obtained to inject left knee joint  Timeout was completed to confirm the site of injection  The medications used were 40 mg of Depo-Medrol and 1% lidocaine 3 cc  Anesthesia was provided by ethyl chloride and the skin was prepped with alcohol.  After cleaning the skin with alcohol a 20-gauge needle was used to inject the left knee joint. There were no complications. A sterile bandage was applied.

## 2015-02-12 ENCOUNTER — Encounter: Payer: Self-pay | Admitting: Orthopedic Surgery

## 2015-02-12 ENCOUNTER — Ambulatory Visit (INDEPENDENT_AMBULATORY_CARE_PROVIDER_SITE_OTHER): Payer: 59 | Admitting: Orthopedic Surgery

## 2015-02-12 VITALS — BP 158/79 | Ht 72.0 in | Wt 324.0 lb

## 2015-02-12 DIAGNOSIS — Z9889 Other specified postprocedural states: Secondary | ICD-10-CM

## 2015-02-12 DIAGNOSIS — M1712 Unilateral primary osteoarthritis, left knee: Secondary | ICD-10-CM

## 2015-02-12 DIAGNOSIS — M1711 Unilateral primary osteoarthritis, right knee: Secondary | ICD-10-CM

## 2015-02-12 NOTE — Progress Notes (Signed)
Chief Complaint  Patient presents with  . Follow-up    follow up [place], [place] 01/02/15   Encounter Diagnoses  Name Primary?  . S/P right knee arthroscopy   . Primary osteoarthritis of left knee   . Primary osteoarthritis of right knee   . S/P left knee arthroscopy Yes   BP 158/79 mmHg  Ht 6' (1.829 m)  Wt 324 lb (146.965 kg)  BMI 43.93 kg/m2  Routine follow-up visit 6 weeks after his most recent knee surgery. He says that his knee overall feels better in some ways but not so good in other ways. He still having the crepitation which bothers him. His activities of daily living are still difficult his walking has improved slightly he less pain with sitting his portals are little sensitive   Read bilateral knee replacements. He needs to lose about 50 more pounds to get his BMI down to 40 or less day decreased scalp occasion rate and decrease his increased weight of wear which has been associated with obesity  BP 158/79 mmHg  Ht 6' (1.829 m)  Wt 324 lb (146.965 kg)  BMI 43.93 kg/m2

## 2015-03-12 ENCOUNTER — Ambulatory Visit: Payer: 59 | Admitting: Orthopedic Surgery

## 2015-03-16 ENCOUNTER — Ambulatory Visit (INDEPENDENT_AMBULATORY_CARE_PROVIDER_SITE_OTHER): Payer: 59 | Admitting: Orthopedic Surgery

## 2015-03-16 ENCOUNTER — Encounter: Payer: Self-pay | Admitting: Orthopedic Surgery

## 2015-03-16 VITALS — BP 163/88 | Ht 72.0 in | Wt 324.0 lb

## 2015-03-16 DIAGNOSIS — Z9889 Other specified postprocedural states: Secondary | ICD-10-CM

## 2015-03-16 DIAGNOSIS — M1712 Unilateral primary osteoarthritis, left knee: Secondary | ICD-10-CM

## 2015-03-16 DIAGNOSIS — M1711 Unilateral primary osteoarthritis, right knee: Secondary | ICD-10-CM

## 2015-03-16 MED ORDER — OXYCODONE-ACETAMINOPHEN 5-325 MG PO TABS: 1.0000 | ORAL_TABLET | ORAL | Status: DC | PRN

## 2015-03-16 NOTE — Progress Notes (Signed)
Post op Note   Chief Complaint  Patient presents with  . Follow-up    follow up [facility] 01/02/15    Encounter Diagnoses  Name Primary?  . S/P right knee arthroscopy Yes  . Primary osteoarthritis of left knee   . Primary osteoarthritis of right knee   . S/P left knee arthroscopy     The patient reports continued weakness in both legs and difficulty getting up and down a seated position although his therapy is going well  He comes in without assistive devices today no significant limp is noted  Given been treating the knee fairly well with restricted flexion which is been chronic in flexion contracture also chronic  No effusion  Recommend continue we have 4 weeks  Follow-up 4 weeks  Refill Percocet for pain  Plan on total knee replacement in July  Continue to work on weight loss  Patient will need bilateral knee replacements in the near future.

## 2015-03-18 ENCOUNTER — Encounter: Payer: Self-pay | Admitting: Orthopedic Surgery

## 2015-04-21 ENCOUNTER — Encounter: Payer: Self-pay | Admitting: Orthopedic Surgery

## 2015-04-21 ENCOUNTER — Ambulatory Visit (INDEPENDENT_AMBULATORY_CARE_PROVIDER_SITE_OTHER): Payer: 59 | Admitting: Orthopedic Surgery

## 2015-04-21 VITALS — BP 144/94 | Ht 72.0 in | Wt 324.0 lb

## 2015-04-21 DIAGNOSIS — M1712 Unilateral primary osteoarthritis, left knee: Secondary | ICD-10-CM

## 2015-04-21 DIAGNOSIS — Z9889 Other specified postprocedural states: Secondary | ICD-10-CM

## 2015-04-21 DIAGNOSIS — M1711 Unilateral primary osteoarthritis, right knee: Secondary | ICD-10-CM | POA: Diagnosis not present

## 2015-04-21 MED ORDER — OXYCODONE-ACETAMINOPHEN 5-325 MG PO TABS: 1.0000 | ORAL_TABLET | ORAL | Status: DC | PRN

## 2015-04-21 NOTE — Progress Notes (Signed)
Chief Complaint  Patient presents with  . Follow-up    4 week follow up [place], DOS 01/02/15    BP 144/94 mmHg  Ht 6' (1.829 m)  Wt 324 lb (146.965 kg)  BMI 43.93 kg/m2  The patient has continued problems with both knees. He can walk well. His range of motion has plateaued on the right knee. He's having more pain on the left knee.   System review negative for neurologic symptoms   He also has crepitance in the right knee  His function has decreased significantly in terms of his activities daily living to include include limitations of squatting bending kneeling and getting out of a chair right knee flexion 110 left knee flexion 120 both knees stable neurovascular exams intact bilaterally   He would like to have knee surgery for total knee replacement sometime in the summer we'll see him back at that time for evaluation.

## 2015-06-23 ENCOUNTER — Ambulatory Visit (INDEPENDENT_AMBULATORY_CARE_PROVIDER_SITE_OTHER): Payer: 59 | Admitting: Orthopedic Surgery

## 2015-06-23 ENCOUNTER — Encounter: Payer: Self-pay | Admitting: Orthopedic Surgery

## 2015-06-23 VITALS — BP 164/95 | Ht 72.0 in | Wt 322.0 lb

## 2015-06-23 DIAGNOSIS — Z9889 Other specified postprocedural states: Secondary | ICD-10-CM | POA: Diagnosis not present

## 2015-06-23 DIAGNOSIS — M1711 Unilateral primary osteoarthritis, right knee: Secondary | ICD-10-CM | POA: Diagnosis not present

## 2015-06-23 DIAGNOSIS — M1712 Unilateral primary osteoarthritis, left knee: Secondary | ICD-10-CM

## 2015-06-23 MED ORDER — OXYCODONE-ACETAMINOPHEN 5-325 MG PO TABS: 1.0000 | ORAL_TABLET | ORAL | Status: DC | PRN

## 2015-06-23 NOTE — Progress Notes (Signed)
Patient ID: Nicholas Delacruz, male   DOB: [birth year], 49 y.o.   MRN: [id number] Chief Complaint  Patient presents with  . Follow-up    2 month follow up bilateral knees, discuss TKA    Nicholas Delacruz presents today to discuss possible knee surgery. He still having more symptoms in the left knee than the right and we had planned to do the right knee. We still would like him to lose is willing to work on that further prior to knee replacement surgery  We will have him come back in August and reevaluate both knees with the possibility of the left knee arthroscopy versus a right total knee. We will need a left knee x-ray. If we decide to do right total knee however I will get a right total knee preop film

## 2015-06-25 ENCOUNTER — Encounter: Payer: Self-pay | Admitting: Orthopedic Surgery

## 2015-07-28 ENCOUNTER — Ambulatory Visit (INDEPENDENT_AMBULATORY_CARE_PROVIDER_SITE_OTHER): Payer: 59 | Admitting: Orthopedic Surgery

## 2015-07-28 ENCOUNTER — Encounter: Payer: Self-pay | Admitting: Orthopedic Surgery

## 2015-07-28 VITALS — BP 169/92 | Ht 72.0 in | Wt 345.0 lb

## 2015-07-28 DIAGNOSIS — I10 Essential (primary) hypertension: Secondary | ICD-10-CM | POA: Diagnosis not present

## 2015-07-28 DIAGNOSIS — Z9889 Other specified postprocedural states: Secondary | ICD-10-CM

## 2015-07-28 DIAGNOSIS — M1711 Unilateral primary osteoarthritis, right knee: Secondary | ICD-10-CM

## 2015-07-28 DIAGNOSIS — M1712 Unilateral primary osteoarthritis, left knee: Secondary | ICD-10-CM | POA: Diagnosis not present

## 2015-07-28 NOTE — Progress Notes (Signed)
Patient ID: Nicholas Delacruz, male   DOB: [birth year], 49 y.o.   MRN: [id number]  Follow up visit  Chief Complaint  Patient presents with  . Follow-up    5 week follow up left knee, discuss surgery    BP 169/92 mmHg  Ht 6' (1.829 m)  Wt 345 lb (156.491 kg)  BMI 46.78 kg/m2  Encounter Diagnoses  Name Primary?  . S/P right knee arthroscopy Yes  . S/P left knee arthroscopy   . Primary osteoarthritis of left knee   . Primary osteoarthritis of right knee   . Uncontrolled hypertension      Mr. Easterly came in today to discuss possible knee replacement. His basic metabolic index is 46 much over 40 as we discussed. His blood pressure is now not well controlled despite recent increase in both his medications. He continues to have bilateral knee pain left greater than right. The right knee however is more significant in terms of function when combined with pain  At this point we recommend that he get his blood pressure under control and then we can proceed with a right total knee.  He will work on that over the next few weeks he will see his doctor  At follow-up we will do a right knee x-ray and a left knee injection   Is not able to work at this point and not able to have surgery secondary to blood pressure problems

## 2015-08-17 ENCOUNTER — Ambulatory Visit (INDEPENDENT_AMBULATORY_CARE_PROVIDER_SITE_OTHER): Payer: 59

## 2015-08-17 ENCOUNTER — Ambulatory Visit (INDEPENDENT_AMBULATORY_CARE_PROVIDER_SITE_OTHER): Payer: 59 | Admitting: Orthopedic Surgery

## 2015-08-17 ENCOUNTER — Encounter: Payer: Self-pay | Admitting: Orthopedic Surgery

## 2015-08-17 VITALS — BP 156/83 | Ht 72.0 in | Wt 340.0 lb

## 2015-08-17 DIAGNOSIS — M129 Arthropathy, unspecified: Secondary | ICD-10-CM

## 2015-08-17 DIAGNOSIS — Z9889 Other specified postprocedural states: Secondary | ICD-10-CM

## 2015-08-17 DIAGNOSIS — M1711 Unilateral primary osteoarthritis, right knee: Secondary | ICD-10-CM

## 2015-08-17 DIAGNOSIS — M1712 Unilateral primary osteoarthritis, left knee: Secondary | ICD-10-CM

## 2015-08-17 DIAGNOSIS — I10 Essential (primary) hypertension: Secondary | ICD-10-CM | POA: Diagnosis not present

## 2015-08-17 DIAGNOSIS — M1710 Unilateral primary osteoarthritis, unspecified knee: Secondary | ICD-10-CM

## 2015-08-17 NOTE — Patient Instructions (Signed)
Return after BP normalizes or if knee pain changes

## 2015-08-24 NOTE — Progress Notes (Signed)
Chief Complaint  Patient presents with  . Follow-up    3 week recheck on left knee and recheck blood pressure.    Nicholas Delacruz comes in with continued elevation in his blood pressure BP 156/83 mmHg  Ht 6' (1.829 m)  Wt 340 lb (154.223 kg)  BMI 46.10 kg/m2  He is medically not clear for surgery at this point    Encounter Diagnoses  Name Primary?  Marland Kitchen Arthritis of right knee Yes  . S/P left knee arthroscopy   . S/P right knee arthroscopy   . Uncontrolled hypertension   . Arthritis of knee, left     He is still not able to work secondary to problems ongoing with both knees and now his blood pressure prevents him from having his knee replaced  He is also gaining weight and not losing weight making his BMI greater than 40 which increases his risk for surgical complications  We will inject his knee at this point and then continue to work on his blood pressure with his primary care physician with added a hydrochlorothiazide. We will follow-up with him after his blood pressures under control  Inject knee

## 2015-10-16 ENCOUNTER — Telehealth: Payer: Self-pay | Admitting: Orthopedic Surgery

## 2015-10-16 NOTE — Telephone Encounter (Signed)
Per letter received in fax, primary care provider, Dr. Sharilyn Sites, [facility], notes that patient is not recommended for surgery at this time, due to elevated blood pressure and uncontrolled blood sugars.  Patient and Dr Aline Brochure aware. (Copy of letter in scanned documents)

## 2015-10-26 ENCOUNTER — Encounter: Payer: Self-pay | Admitting: Orthopedic Surgery

## 2015-11-10 ENCOUNTER — Ambulatory Visit (INDEPENDENT_AMBULATORY_CARE_PROVIDER_SITE_OTHER): Payer: 59 | Admitting: Orthopedic Surgery

## 2015-11-10 VITALS — BP 179/109 | Ht 72.0 in | Wt 355.0 lb

## 2015-11-10 DIAGNOSIS — M1711 Unilateral primary osteoarthritis, right knee: Secondary | ICD-10-CM | POA: Diagnosis not present

## 2015-11-10 DIAGNOSIS — I10 Essential (primary) hypertension: Secondary | ICD-10-CM

## 2015-11-10 NOTE — Progress Notes (Signed)
Chief Complaint  Patient presents with  . Follow-up    request right knee injection    Nicholas Delacruz comes in today blood pressure still high actually worse  Right knee pain increased  He's not able to work secondary to problems with both knees and his blood pressure. His blood pressure is still too high to get his knee replaced   System review no headache no increased urinary symptoms   He's gaining weight and not losing weight and his BMI is greater than 40 which makes him at increased risk for surgical complications as well BP 179/109 mmHg  Ht 6' (1.829 m)  Wt 355 lb (161.027 kg)  BMI 48.14 kg/m2  We injected his right knee temporizing his condition until he can get the blood pressure fixed  He can get an injection every month until his blood pressure normalizes for surgery  Procedure note right knee injection verbal consent was obtained to inject right knee joint  Timeout was completed to confirm the site of injection  The medications used were 40 mg of Depo-Medrol and 1% lidocaine 3 cc  Anesthesia was provided by ethyl chloride and the skin was prepped with alcohol.  After cleaning the skin with alcohol a 20-gauge needle was used to inject the right knee joint. There were no complications. A sterile bandage was applied.

## 2015-11-11 ENCOUNTER — Encounter: Payer: Self-pay | Admitting: Orthopedic Surgery

## 2015-11-18 ENCOUNTER — Telehealth: Payer: Self-pay | Admitting: *Deleted

## 2015-11-18 NOTE — Telephone Encounter (Signed)
Patient called requesting something for pain, patient stated when he was in here last he didn't mention to Dr. Aline Brochure about getting something for pain, I made patient aware that Dr. Aline Brochure is out of the office until next week. Please advise

## 2015-11-23 NOTE — Telephone Encounter (Signed)
Routing to Dr Harrison for review 

## 2015-11-24 ENCOUNTER — Other Ambulatory Visit: Payer: Self-pay | Admitting: *Deleted

## 2015-11-24 MED ORDER — OXYCODONE-ACETAMINOPHEN 5-325 MG PO TABS: 1.0000 | ORAL_TABLET | ORAL | Status: DC | PRN

## 2015-11-24 NOTE — Telephone Encounter (Signed)
Prescription available, patient aware  

## 2015-11-24 NOTE — Telephone Encounter (Signed)
Check his prescriptions and if he is due for refill   refill

## 2015-11-24 NOTE — Telephone Encounter (Signed)
Noted  

## 2015-12-10 ENCOUNTER — Ambulatory Visit: Payer: 59 | Admitting: Orthopedic Surgery

## 2015-12-17 ENCOUNTER — Encounter: Payer: Self-pay | Admitting: *Deleted

## 2015-12-17 ENCOUNTER — Ambulatory Visit: Payer: 59 | Admitting: Orthopedic Surgery

## 2015-12-31 ENCOUNTER — Other Ambulatory Visit: Payer: Self-pay | Admitting: *Deleted

## 2015-12-31 DIAGNOSIS — M1990 Unspecified osteoarthritis, unspecified site: Secondary | ICD-10-CM

## 2015-12-31 DIAGNOSIS — M25562 Pain in left knee: Principal | ICD-10-CM

## 2015-12-31 DIAGNOSIS — M25561 Pain in right knee: Secondary | ICD-10-CM

## 2015-12-31 MED ORDER — DICLOFENAC SODIUM 50 MG PO TBEC: 50.0000 mg | DELAYED_RELEASE_TABLET | Freq: Two times a day (BID) | ORAL | Status: DC

## 2016-01-05 ENCOUNTER — Ambulatory Visit (INDEPENDENT_AMBULATORY_CARE_PROVIDER_SITE_OTHER): Payer: 59 | Admitting: Orthopedic Surgery

## 2016-01-05 ENCOUNTER — Encounter: Payer: Self-pay | Admitting: Orthopedic Surgery

## 2016-01-05 VITALS — BP 167/99 | Ht 72.0 in | Wt 355.0 lb

## 2016-01-05 DIAGNOSIS — M1711 Unilateral primary osteoarthritis, right knee: Secondary | ICD-10-CM

## 2016-01-05 DIAGNOSIS — M1712 Unilateral primary osteoarthritis, left knee: Secondary | ICD-10-CM

## 2016-01-05 DIAGNOSIS — I10 Essential (primary) hypertension: Secondary | ICD-10-CM

## 2016-01-05 DIAGNOSIS — Z9889 Other specified postprocedural states: Secondary | ICD-10-CM

## 2016-01-05 DIAGNOSIS — M75102 Unspecified rotator cuff tear or rupture of left shoulder, not specified as traumatic: Secondary | ICD-10-CM | POA: Diagnosis not present

## 2016-01-05 NOTE — Progress Notes (Signed)
This is a follow-up visit for repeat evaluation of bilateral knee pain with osteoarthritis, obesity, hypertension and a new complaint of left shoulder pain.  First addressing the knees the patient has osteoarthritis in both knees right is currently worse than left. We are in the processes of trying to prep for knee replacement surgery on the right however his blood pressure continues to be a problem 167/99 today. His obesity has improved with a 30 pound weight loss  Body mass index is 48.14 kg/(m^2).   Left shoulder the patient was reaching up into a cabinet felt a pop in the previously repaired rotator cuff. Now complains of crepitance pain with elevation mild weakness and some numbness and tingling in the upper deltoid  Review of systems constitutional symptoms negative other musculoskeletal symptoms negative neurologic symptoms otherwise negative  Past Medical History  Diagnosis Date  . High blood pressure   . Obstructive sleep apnea on CPAP   . PONV (postoperative nausea and vomiting)   . GERD (gastroesophageal reflux disease)      BP 167/99 mmHg  Ht 6' (1.829 m)  Wt 355 lb (161.027 kg)  BMI 48.14 kg/m2 Mr. Volkov comes in well-groomed. He is oriented 3. His mood is pleasant. He is ambulatory with a limp favoring his right leg. His left shoulder is painful with elevation of the arm no weakness in either shoulder right to left stability tests were normal impingement sign was positive skin was intact neurovascular exam was normal  Obesity slightly improved  Hypertension still not suitable for surgery  Osteoarthritis bilateral knees right worse than left no improvement Plan for total knee replacement right knee  New problem impingement syndrome left shoulder  Procedure note the subacromial injection shoulder left   Verbal consent was obtained to inject the  Left   Shoulder  Timeout was completed to confirm the injection site is a subacromial space of the  left   shoulder  Medication used Depo-Medrol 40 mg and lidocaine 1% 3 cc  Anesthesia was provided by ethyl chloride  The injection was performed in the left  posterior subacromial space. After pinning the skin with alcohol and anesthetized the skin with ethyl chloride the subacromial space was injected using a 20-gauge needle. There were no complications  Sterile dressing was applied.   Six-week follow-up check blood pressure and weight

## 2016-02-16 ENCOUNTER — Ambulatory Visit: Payer: 59 | Admitting: Orthopedic Surgery

## 2016-03-09 ENCOUNTER — Ambulatory Visit (INDEPENDENT_AMBULATORY_CARE_PROVIDER_SITE_OTHER): Payer: 59 | Admitting: Orthopedic Surgery

## 2016-03-09 ENCOUNTER — Encounter: Payer: Self-pay | Admitting: Orthopedic Surgery

## 2016-03-09 VITALS — BP 158/94 | Ht 72.0 in | Wt 346.0 lb

## 2016-03-09 DIAGNOSIS — M75102 Unspecified rotator cuff tear or rupture of left shoulder, not specified as traumatic: Secondary | ICD-10-CM

## 2016-03-09 DIAGNOSIS — I10 Essential (primary) hypertension: Secondary | ICD-10-CM

## 2016-03-09 DIAGNOSIS — M1712 Unilateral primary osteoarthritis, left knee: Secondary | ICD-10-CM | POA: Diagnosis not present

## 2016-03-09 DIAGNOSIS — Z9889 Other specified postprocedural states: Secondary | ICD-10-CM | POA: Diagnosis not present

## 2016-03-09 DIAGNOSIS — M1711 Unilateral primary osteoarthritis, right knee: Secondary | ICD-10-CM

## 2016-03-09 MED ORDER — OXYCODONE-ACETAMINOPHEN 5-325 MG PO TABS: 1.0000 | ORAL_TABLET | ORAL | Status: DC | PRN

## 2016-03-09 NOTE — Progress Notes (Signed)
  Patient ID: Nicholas Delacruz, male   DOB: May 08, 1966, 50 y.o.   MRN: [id number]  Chief Complaint  Patient presents with  . Follow-up    FOLLOW UP RIGHT KNEE    HPI follow-up visit osteoarthritis right knee and left knee. Patient is in line for total knee replacement on the right.  However, he continued to have hypertension he was placed on 3 Medications and diabetic medication. His blood pressure in the office today was still elevated. He had a kidney ultrasound to check for secondary reasons of hypertension that was normal    ROS  BP 158/94 mmHg  Ht 6' (1.829 m)  Wt 346 lb (156.945 kg)  BMI 46.92 kg/m2  well-groomed. He is oriented 3. His mood is pleasant. He is ambulatory with a limp favoring his right leg. His left shoulder is painful with elevation of the arm no weakness in either shoulder right to left stability tests were normal impingement sign was positive skin was intact neurovascular exam was normal  Obesity slightly improved  ASSESSMENT AND PLAN   Return around May recheck blood pressure and his situation in regards regarding right knee replacement

## 2016-03-10 ENCOUNTER — Encounter: Payer: Self-pay | Admitting: Orthopedic Surgery

## 2016-05-16 ENCOUNTER — Ambulatory Visit: Payer: 59 | Admitting: Orthopedic Surgery

## 2016-05-25 ENCOUNTER — Telehealth: Payer: Self-pay

## 2016-05-25 ENCOUNTER — Ambulatory Visit: Payer: 59 | Admitting: Orthopedic Surgery

## 2016-05-25 NOTE — Telephone Encounter (Signed)
Request rx refill oxycodone 5-325

## 2016-05-25 NOTE — Telephone Encounter (Signed)
Patient No showed to his appointment today. Do you want to refill?

## 2016-05-27 ENCOUNTER — Other Ambulatory Visit: Payer: Self-pay | Admitting: Orthopedic Surgery

## 2016-05-27 MED ORDER — OXYCODONE-ACETAMINOPHEN 5-325 MG PO TABS: 1.0000 | ORAL_TABLET | Freq: Three times a day (TID) | ORAL | Status: DC | PRN

## 2016-05-30 ENCOUNTER — Ambulatory Visit: Payer: 59 | Admitting: Orthopedic Surgery

## 2016-05-30 NOTE — Telephone Encounter (Signed)
Routing to Dr Harrison for approval 

## 2016-05-30 NOTE — Telephone Encounter (Signed)
PLEASE SCHEDULE APPT FOR REFILL, NO REFILL WITHOUT APPT PER DR HARRSION

## 2016-05-30 NOTE — Telephone Encounter (Signed)
Patient called back to follow up on refill request; states he received a voice message.  I relayed that it was likely to discuss the refill request at his re-scheduled appointment for today, Monday, 05/30/16. He states he's been sitting with his dad, who has recently fractured his hip, and may again be unable to come to appointment if can't get another family member to stay with dad.  Please advise regarding refill.

## 2016-05-30 NOTE — Telephone Encounter (Signed)
Cant refill 

## 2016-05-31 NOTE — Telephone Encounter (Signed)
Called patient to relay per Dr Ruthe Mannan response. Left voice mail message to return call.

## 2016-05-31 NOTE — Telephone Encounter (Signed)
Patient called back; voiced understanding; states will be re-scheduling as soon as he can make arrangements around staying with his dad during illness.

## 2016-06-22 ENCOUNTER — Other Ambulatory Visit: Payer: Self-pay | Admitting: Orthopedic Surgery

## 2016-06-23 ENCOUNTER — Other Ambulatory Visit: Payer: Self-pay | Admitting: *Deleted

## 2016-06-23 DIAGNOSIS — M1990 Unspecified osteoarthritis, unspecified site: Secondary | ICD-10-CM

## 2016-06-23 DIAGNOSIS — M25561 Pain in right knee: Secondary | ICD-10-CM

## 2016-06-23 DIAGNOSIS — M25562 Pain in left knee: Principal | ICD-10-CM

## 2016-06-23 MED ORDER — DICLOFENAC SODIUM 50 MG PO TBEC: 50.0000 mg | DELAYED_RELEASE_TABLET | Freq: Two times a day (BID) | ORAL | Status: DC

## 2016-07-15 ENCOUNTER — Inpatient Hospital Stay (HOSPITAL_COMMUNITY): Admission: RE | Admit: 2016-07-15 | Payer: 59 | Source: Ambulatory Visit

## 2016-08-19 ENCOUNTER — Encounter (HOSPITAL_COMMUNITY)
Admission: RE | Admit: 2016-08-19 | Discharge: 2016-08-19 | Disposition: A | Discharge status: Home / Self Care | Payer: 59 | Source: Ambulatory Visit | Attending: Orthopedic Surgery | Admitting: Orthopedic Surgery

## 2016-08-19 ENCOUNTER — Encounter (HOSPITAL_COMMUNITY): Payer: Self-pay

## 2016-08-19 DIAGNOSIS — M1711 Unilateral primary osteoarthritis, right knee: Secondary | ICD-10-CM | POA: Insufficient documentation

## 2016-08-19 DIAGNOSIS — Z01812 Encounter for preprocedural laboratory examination: Secondary | ICD-10-CM | POA: Diagnosis not present

## 2016-08-19 HISTORY — DX: Type 2 diabetes mellitus without complications: E11.9

## 2016-08-19 LAB — CBC
HCT: 40.1 % (ref 39.0–52.0)
Hemoglobin: 12.7 g/dL — ABNORMAL LOW (ref 13.0–17.0)
MCH: 27.4 pg (ref 26.0–34.0)
MCHC: 31.7 g/dL (ref 30.0–36.0)
MCV: 86.6 fL (ref 78.0–100.0)
Platelets: 394 10*3/uL (ref 150–400)
RBC: 4.63 MIL/uL (ref 4.22–5.81)
RDW: 14.4 % (ref 11.5–15.5)
WBC: 10 10*3/uL (ref 4.0–10.5)

## 2016-08-19 LAB — BASIC METABOLIC PANEL
Anion gap: 7 (ref 5–15)
BUN: 15 mg/dL (ref 6–20)
CO2: 26 mmol/L (ref 22–32)
Calcium: 9.9 mg/dL (ref 8.9–10.3)
Chloride: 104 mmol/L (ref 101–111)
Creatinine, Ser: 1.1 mg/dL (ref 0.61–1.24)
GFR calc Af Amer: >60 mL/min (ref >60)
GFR calc non Af Amer: >60 mL/min (ref >60)
Glucose, Bld: 147 mg/dL — ABNORMAL HIGH (ref 65–99)
Potassium: 4.2 mmol/L (ref 3.5–5.1)
Sodium: 137 mmol/L (ref 135–145)

## 2016-08-19 LAB — GLUCOSE, CAPILLARY: Glucose-Capillary: 133 mg/dL — ABNORMAL HIGH (ref 65–99)

## 2016-08-19 LAB — SURGICAL PCR SCREEN
MRSA, PCR: POSITIVE — AB
Staphylococcus aureus: POSITIVE — AB

## 2016-08-19 NOTE — Pre-Procedure Instructions (Addendum)
Nicholas Delacruz  08/19/2016      [facility] - [place], [place] [id number] [place] [id number] Phone: [phone] Fax: [phone]    Your procedure is scheduled on Sept. 5   Report to [facility] Admitting at 9:00 A.M.  Call this number if you have problems the morning of surgery:  [phone]   Remember:  Do not eat food or drink liquids after midnight on Sept. 4   Take these medicines the morning of surgery with A SIP OF WATER : diltiazem (cardizem), hydrocodone, labetalol, aciphex, nasal spray             Stop NSAIDS: advil, motrin, ibuprofen, aleve, BC Powder, goodys,vitamins, herbal medicines.    How to Manage Your Diabetes Before and After Surgery  Why is it important to control my blood sugar before and after surgery? . Improving blood sugar levels before and after surgery helps healing and can limit problems. . A way of improving blood sugar control is eating a healthy diet by: o  Eating less sugar and carbohydrates o  Increasing activity/exercise o  Talking with your doctor about reaching your blood sugar goals . High blood sugars (greater than 180 mg/dL) can raise your risk of infections and slow your recovery, so you will need to focus on controlling your diabetes during the weeks before surgery. . Make sure that the doctor who takes care of your diabetes knows about your planned surgery including the date and location.  How do I manage my blood sugar before surgery? . Check your blood sugar at least 4 times a day, starting 2 days before surgery, to make sure that the level is not too high or low. o Check your blood sugar the morning of your surgery when you wake up and every 2 hours until you get to the Short Stay unit. . If your blood sugar is less than 70 mg/dL, you will need to treat for low blood sugar: o Do not take insulin. o Treat a low blood sugar (less than 70 mg/dL) with  cup of clear  juice (cranberry or apple), 4 glucose tablets, OR glucose gel. o Recheck blood sugar in 15 minutes after treatment (to make sure it is greater than 70 mg/dL). If your blood sugar is not greater than 70 mg/dL on recheck, call [phone] for further instructions. . Report your blood sugar to the short stay nurse when you get to Short Stay.  . If you are admitted to the hospital after surgery: o Your blood sugar will be checked by the staff and you will probably be given insulin after surgery (instead of oral diabetes medicines) to make sure you have good blood sugar levels. o The goal for blood sugar control after surgery is 80-180 mg/dL.              WHAT DO I DO ABOUT MY DIABETES MEDICATION?   [place] Kitchen Do not take oral diabetes medicines (pills) the morning of surgery.   Do not wear jewelry.  Do not wear lotions, powders, or cologne, or deoderant.  Do not shave 48 hours prior to surgery.  Men may shave face and neck.  Do not bring valuables to the hospital.  [facility] is not responsible for any belongings or valuables.  Contacts, dentures or bridgework may not be worn into surgery.  Leave your suitcase in the car.  After surgery it may be brought to your room.  For patients  admitted to the hospital, discharge time will be determined by your treatment team.  Patients discharged the day of surgery will not be allowed to drive home.  Special instructions:  Preparing for surgery  Please read over the following fact sheets that you were given. Total Joint Packet and MRSA Information

## 2016-08-19 NOTE — Progress Notes (Signed)
I called a prescription for Mupirocin ointment to [facility], [place], [place].

## 2016-08-19 NOTE — Progress Notes (Signed)
PCP: Dr. Jenny Reichmann golden @ [facility] in [place], he manages pt's diabetes. Will request clearance note, ekg, Fasting blood sugars 110-120 No cardiologist.  Request sleep study from [facility].

## 2016-08-20 LAB — HEMOGLOBIN A1C
Hgb A1c MFr Bld: 6.4 % — ABNORMAL HIGH (ref 4.8–5.6)
Mean Plasma Glucose: 137 mg/dL

## 2016-08-22 ENCOUNTER — Other Ambulatory Visit: Payer: Self-pay | Admitting: Orthopedic Surgery

## 2016-08-26 MED ORDER — DEXTROSE 5 % IV SOLN
3.0000 g | INTRAVENOUS | Status: AC
  Administered 2016-08-30: 3 g via INTRAVENOUS
  Filled 2016-08-26: qty 3000

## 2016-08-30 ENCOUNTER — Encounter (HOSPITAL_COMMUNITY): Payer: Self-pay | Admitting: *Deleted

## 2016-08-30 ENCOUNTER — Inpatient Hospital Stay (HOSPITAL_COMMUNITY): Payer: 59

## 2016-08-30 ENCOUNTER — Encounter (HOSPITAL_COMMUNITY)
Admission: RE | Disposition: A | Discharge status: Skilled Nursing Facility | Payer: Self-pay | Source: Ambulatory Visit | Attending: Orthopedic Surgery

## 2016-08-30 ENCOUNTER — Inpatient Hospital Stay (HOSPITAL_COMMUNITY)
Admission: RE | Admit: 2016-08-30 | Discharge: 2016-09-02 | DRG: 470 | Disposition: A | Discharge status: Skilled Nursing Facility | Payer: 59 | Source: Ambulatory Visit | Attending: Orthopedic Surgery | Admitting: Orthopedic Surgery

## 2016-08-30 ENCOUNTER — Inpatient Hospital Stay (HOSPITAL_COMMUNITY): Payer: 59 | Admitting: Anesthesiology

## 2016-08-30 ENCOUNTER — Inpatient Hospital Stay (HOSPITAL_COMMUNITY): Payer: 59 | Admitting: Emergency Medicine

## 2016-08-30 DIAGNOSIS — I10 Essential (primary) hypertension: Secondary | ICD-10-CM | POA: Diagnosis present

## 2016-08-30 DIAGNOSIS — Z7982 Long term (current) use of aspirin: Secondary | ICD-10-CM

## 2016-08-30 DIAGNOSIS — K219 Gastro-esophageal reflux disease without esophagitis: Secondary | ICD-10-CM | POA: Diagnosis present

## 2016-08-30 DIAGNOSIS — M1711 Unilateral primary osteoarthritis, right knee: Secondary | ICD-10-CM | POA: Diagnosis present

## 2016-08-30 DIAGNOSIS — Z96659 Presence of unspecified artificial knee joint: Secondary | ICD-10-CM

## 2016-08-30 DIAGNOSIS — Z881 Allergy status to other antibiotic agents status: Secondary | ICD-10-CM

## 2016-08-30 DIAGNOSIS — Z79899 Other long term (current) drug therapy: Secondary | ICD-10-CM

## 2016-08-30 DIAGNOSIS — Z7984 Long term (current) use of oral hypoglycemic drugs: Secondary | ICD-10-CM

## 2016-08-30 DIAGNOSIS — Z6841 Body Mass Index (BMI) 40.0 and over, adult: Secondary | ICD-10-CM

## 2016-08-30 DIAGNOSIS — E119 Type 2 diabetes mellitus without complications: Secondary | ICD-10-CM | POA: Diagnosis present

## 2016-08-30 DIAGNOSIS — G4733 Obstructive sleep apnea (adult) (pediatric): Secondary | ICD-10-CM | POA: Diagnosis present

## 2016-08-30 HISTORY — PX: TOTAL KNEE ARTHROPLASTY: SHX125

## 2016-08-30 LAB — GLUCOSE, CAPILLARY
Glucose-Capillary: 143 mg/dL — ABNORMAL HIGH (ref 65–99)
Glucose-Capillary: 212 mg/dL — ABNORMAL HIGH (ref 65–99)
Glucose-Capillary: 215 mg/dL — ABNORMAL HIGH (ref 65–99)
Glucose-Capillary: 188 mg/dL — ABNORMAL HIGH (ref 65–99)

## 2016-08-30 SURGERY — ARTHROPLASTY, KNEE, TOTAL
Anesthesia: Spinal | Site: Knee | Laterality: Right

## 2016-08-30 MED ORDER — DEXMEDETOMIDINE HCL IN NACL 200 MCG/50ML IV SOLN
INTRAVENOUS | Status: AC
  Filled 2016-08-30: qty 50

## 2016-08-30 MED ORDER — PROPOFOL 10 MG/ML IV BOLUS
INTRAVENOUS | Status: AC
  Filled 2016-08-30: qty 20

## 2016-08-30 MED ORDER — FENTANYL CITRATE (PF) 100 MCG/2ML IJ SOLN
INTRAMUSCULAR | Status: AC
  Administered 2016-08-30: 50 ug
  Filled 2016-08-30: qty 2

## 2016-08-30 MED ORDER — DIPHENHYDRAMINE HCL 12.5 MG/5ML PO ELIX: 12.5000 mg | ORAL_SOLUTION | ORAL | Status: DC | PRN

## 2016-08-30 MED ORDER — VANCOMYCIN HCL IN DEXTROSE 1-5 GM/200ML-% IV SOLN
1000.0000 mg | Freq: Two times a day (BID) | INTRAVENOUS | Status: AC
Start: 2016-08-30 — End: 2016-08-30
  Administered 2016-08-30: 1000 mg via INTRAVENOUS
  Filled 2016-08-30: qty 200

## 2016-08-30 MED ORDER — ONDANSETRON HCL 4 MG PO TABS: 4.0000 mg | ORAL_TABLET | Freq: Three times a day (TID) | ORAL | 0 refills | Status: DC | PRN

## 2016-08-30 MED ORDER — LACTATED RINGERS IV SOLN
INTRAVENOUS | Status: DC
  Administered 2016-08-30 (×2): via INTRAVENOUS

## 2016-08-30 MED ORDER — FENTANYL CITRATE (PF) 100 MCG/2ML IJ SOLN
25.0000 ug | INTRAMUSCULAR | Status: DC | PRN
  Administered 2016-08-30 (×2): 50 ug via INTRAVENOUS

## 2016-08-30 MED ORDER — FENTANYL CITRATE (PF) 100 MCG/2ML IJ SOLN
INTRAMUSCULAR | Status: AC
  Filled 2016-08-30: qty 2

## 2016-08-30 MED ORDER — FENTANYL CITRATE (PF) 100 MCG/2ML IJ SOLN
INTRAMUSCULAR | Status: DC | PRN
  Administered 2016-08-30 (×4): 50 ug via INTRAVENOUS

## 2016-08-30 MED ORDER — SENNA 8.6 MG PO TABS
1.0000 | ORAL_TABLET | Freq: Two times a day (BID) | ORAL | Status: DC
Start: 2016-08-30 — End: 2016-09-02
  Administered 2016-08-30 – 2016-09-02 (×6): 8.6 mg via ORAL
  Filled 2016-08-30 (×6): qty 1

## 2016-08-30 MED ORDER — DOCUSATE SODIUM 100 MG PO CAPS
100.0000 mg | ORAL_CAPSULE | Freq: Two times a day (BID) | ORAL | Status: DC
  Administered 2016-08-30 – 2016-09-02 (×6): 100 mg via ORAL
  Filled 2016-08-30 (×6): qty 1

## 2016-08-30 MED ORDER — EPHEDRINE SULFATE-NACL 50-0.9 MG/10ML-% IV SOSY
PREFILLED_SYRINGE | INTRAVENOUS | Status: DC | PRN
  Administered 2016-08-30: 10 mg via INTRAVENOUS
  Administered 2016-08-30: 5 mg via INTRAVENOUS
  Administered 2016-08-30: 15 mg via INTRAVENOUS

## 2016-08-30 MED ORDER — DEXMEDETOMIDINE HCL 200 MCG/2ML IV SOLN
INTRAVENOUS | Status: DC | PRN
  Administered 2016-08-30: 20 ug via INTRAVENOUS

## 2016-08-30 MED ORDER — RIVAROXABAN 10 MG PO TABS: 10.0000 mg | ORAL_TABLET | Freq: Every day | ORAL | 0 refills | Status: DC

## 2016-08-30 MED ORDER — METFORMIN HCL 500 MG PO TABS
500.0000 mg | ORAL_TABLET | Freq: Two times a day (BID) | ORAL | Status: DC
  Administered 2016-08-30 – 2016-09-02 (×6): 500 mg via ORAL
  Filled 2016-08-30 (×6): qty 1

## 2016-08-30 MED ORDER — DEXAMETHASONE SODIUM PHOSPHATE 10 MG/ML IJ SOLN
INTRAMUSCULAR | Status: AC
  Filled 2016-08-30: qty 1

## 2016-08-30 MED ORDER — PROPOFOL 10 MG/ML IV BOLUS
INTRAVENOUS | Status: DC | PRN
  Administered 2016-08-30: 50 mg via INTRAVENOUS
  Administered 2016-08-30: 260 mg via INTRAVENOUS

## 2016-08-30 MED ORDER — OXYCODONE HCL 5 MG PO TABS
5.0000 mg | ORAL_TABLET | ORAL | Status: DC | PRN
  Administered 2016-08-30 – 2016-09-02 (×17): 10 mg via ORAL
  Filled 2016-08-30 (×17): qty 2

## 2016-08-30 MED ORDER — PANTOPRAZOLE SODIUM 40 MG PO TBEC
40.0000 mg | DELAYED_RELEASE_TABLET | Freq: Every day | ORAL | Status: DC
  Administered 2016-08-31 – 2016-09-02 (×3): 40 mg via ORAL
  Filled 2016-08-30 (×3): qty 1

## 2016-08-30 MED ORDER — PROMETHAZINE HCL 25 MG/ML IJ SOLN
6.2500 mg | INTRAMUSCULAR | Status: DC | PRN
Start: 2016-08-30 — End: 2016-08-30

## 2016-08-30 MED ORDER — MIDAZOLAM HCL 2 MG/2ML IJ SOLN
INTRAMUSCULAR | Status: AC
  Administered 2016-08-30: 1 mg
  Filled 2016-08-30: qty 2

## 2016-08-30 MED ORDER — ONDANSETRON HCL 4 MG/2ML IJ SOLN
INTRAMUSCULAR | Status: AC
  Filled 2016-08-30: qty 2

## 2016-08-30 MED ORDER — ONDANSETRON HCL 4 MG/2ML IJ SOLN
INTRAMUSCULAR | Status: DC | PRN
  Administered 2016-08-30 (×2): 4 mg via INTRAVENOUS

## 2016-08-30 MED ORDER — ACETAMINOPHEN 325 MG PO TABS: 650.0000 mg | ORAL_TABLET | Freq: Four times a day (QID) | ORAL | Status: DC | PRN

## 2016-08-30 MED ORDER — DEXAMETHASONE SODIUM PHOSPHATE 10 MG/ML IJ SOLN
INTRAMUSCULAR | Status: DC | PRN
  Administered 2016-08-30: 5 mg via INTRAVENOUS

## 2016-08-30 MED ORDER — PROPOFOL 1000 MG/100ML IV EMUL
INTRAVENOUS | Status: AC
  Filled 2016-08-30: qty 200

## 2016-08-30 MED ORDER — METHOCARBAMOL 1000 MG/10ML IJ SOLN
500.0000 mg | Freq: Four times a day (QID) | INTRAVENOUS | Status: DC | PRN
  Filled 2016-08-30: qty 5

## 2016-08-30 MED ORDER — PHENYLEPHRINE HCL 10 MG/ML IJ SOLN
INTRAVENOUS | Status: DC | PRN
  Administered 2016-08-30: 50 ug/min via INTRAVENOUS

## 2016-08-30 MED ORDER — MIDAZOLAM HCL 5 MG/5ML IJ SOLN
INTRAMUSCULAR | Status: DC | PRN
  Administered 2016-08-30: 2 mg via INTRAVENOUS

## 2016-08-30 MED ORDER — SENNA-DOCUSATE SODIUM 8.6-50 MG PO TABS: 2.0000 | ORAL_TABLET | Freq: Every day | ORAL | 1 refills | Status: DC

## 2016-08-30 MED ORDER — MAGNESIUM CITRATE PO SOLN: 1.0000 | Freq: Once | ORAL | Status: DC | PRN

## 2016-08-30 MED ORDER — METHOCARBAMOL 500 MG PO TABS
ORAL_TABLET | ORAL | Status: AC
  Administered 2016-08-30: 500 mg via ORAL
  Filled 2016-08-30: qty 1

## 2016-08-30 MED ORDER — POLYETHYLENE GLYCOL 3350 17 G PO PACK
17.0000 g | PACK | Freq: Every day | ORAL | Status: DC | PRN
  Administered 2016-09-01: 17 g via ORAL
  Filled 2016-08-30: qty 1

## 2016-08-30 MED ORDER — POTASSIUM CHLORIDE IN NACL 20-0.45 MEQ/L-% IV SOLN
INTRAVENOUS | Status: DC
  Administered 2016-08-31: 07:00:00 via INTRAVENOUS
  Filled 2016-08-30 (×2): qty 1000

## 2016-08-30 MED ORDER — METOCLOPRAMIDE HCL 5 MG PO TABS: 5.0000 mg | ORAL_TABLET | Freq: Three times a day (TID) | ORAL | Status: DC | PRN

## 2016-08-30 MED ORDER — FENTANYL CITRATE (PF) 100 MCG/2ML IJ SOLN
INTRAMUSCULAR | Status: AC
Start: 2016-08-30 — End: 2016-08-30
  Administered 2016-08-30: 50 ug via INTRAVENOUS
  Filled 2016-08-30: qty 2

## 2016-08-30 MED ORDER — METHOCARBAMOL 500 MG PO TABS
500.0000 mg | ORAL_TABLET | Freq: Four times a day (QID) | ORAL | Status: DC | PRN
  Administered 2016-08-30 – 2016-09-02 (×8): 500 mg via ORAL
  Filled 2016-08-30 (×8): qty 1

## 2016-08-30 MED ORDER — HYDROMORPHONE HCL 1 MG/ML IJ SOLN
0.5000 mg | INTRAMUSCULAR | Status: DC | PRN
  Administered 2016-08-30 (×2): 0.5 mg via INTRAVENOUS
  Filled 2016-08-30 (×2): qty 1

## 2016-08-30 MED ORDER — BISACODYL 10 MG RE SUPP: 10.0000 mg | Freq: Every day | RECTAL | Status: DC | PRN

## 2016-08-30 MED ORDER — EPHEDRINE 5 MG/ML INJ
INTRAVENOUS | Status: AC
  Filled 2016-08-30: qty 10

## 2016-08-30 MED ORDER — OXYCODONE HCL 5 MG PO TABS
ORAL_TABLET | ORAL | Status: AC
  Administered 2016-08-30: 10 mg via ORAL
  Filled 2016-08-30: qty 2

## 2016-08-30 MED ORDER — ONDANSETRON HCL 4 MG/2ML IJ SOLN: 4.0000 mg | Freq: Four times a day (QID) | INTRAMUSCULAR | Status: DC | PRN

## 2016-08-30 MED ORDER — RIVAROXABAN 10 MG PO TABS
10.0000 mg | ORAL_TABLET | Freq: Every day | ORAL | Status: DC
Start: 2016-08-31 — End: 2016-09-02
  Administered 2016-08-31 – 2016-09-02 (×3): 10 mg via ORAL
  Filled 2016-08-30 (×3): qty 1

## 2016-08-30 MED ORDER — FLUTICASONE PROPIONATE 50 MCG/ACT NA SUSP
1.0000 | Freq: Every day | NASAL | Status: DC | PRN
  Filled 2016-08-30: qty 16

## 2016-08-30 MED ORDER — INSULIN ASPART 100 UNIT/ML ~~LOC~~ SOLN
0.0000 [IU] | Freq: Three times a day (TID) | SUBCUTANEOUS | Status: DC
  Administered 2016-08-30 – 2016-08-31 (×2): 5 [IU] via SUBCUTANEOUS
  Administered 2016-08-31: 2 [IU] via SUBCUTANEOUS
  Administered 2016-08-31 – 2016-09-01 (×4): 3 [IU] via SUBCUTANEOUS
  Administered 2016-09-02: 2 [IU] via SUBCUTANEOUS

## 2016-08-30 MED ORDER — VANCOMYCIN HCL 10 G IV SOLR
1500.0000 mg | INTRAVENOUS | Status: AC
  Administered 2016-08-30: 1500 mg via INTRAVENOUS
  Filled 2016-08-30: qty 1500

## 2016-08-30 MED ORDER — ROCURONIUM BROMIDE 10 MG/ML (PF) SYRINGE
PREFILLED_SYRINGE | INTRAVENOUS | Status: AC
  Filled 2016-08-30: qty 10

## 2016-08-30 MED ORDER — MIDAZOLAM HCL 2 MG/2ML IJ SOLN
INTRAMUSCULAR | Status: AC
  Filled 2016-08-30: qty 2

## 2016-08-30 MED ORDER — KETOROLAC TROMETHAMINE 15 MG/ML IJ SOLN
7.5000 mg | Freq: Four times a day (QID) | INTRAMUSCULAR | Status: AC
  Administered 2016-08-30 – 2016-08-31 (×3): 7.5 mg via INTRAVENOUS
  Filled 2016-08-30 (×3): qty 1

## 2016-08-30 MED ORDER — LABETALOL HCL 200 MG PO TABS
200.0000 mg | ORAL_TABLET | Freq: Two times a day (BID) | ORAL | Status: DC
  Administered 2016-08-30 – 2016-09-02 (×5): 200 mg via ORAL
  Filled 2016-08-30 (×7): qty 1

## 2016-08-30 MED ORDER — LOSARTAN POTASSIUM 50 MG PO TABS
100.0000 mg | ORAL_TABLET | Freq: Every day | ORAL | Status: DC
  Administered 2016-08-31 – 2016-09-02 (×3): 100 mg via ORAL
  Filled 2016-08-30 (×3): qty 2

## 2016-08-30 MED ORDER — LIDOCAINE 2% (20 MG/ML) 5 ML SYRINGE
INTRAMUSCULAR | Status: DC | PRN
  Administered 2016-08-30: 100 mg via INTRAVENOUS

## 2016-08-30 MED ORDER — SODIUM CHLORIDE 0.9 % IR SOLN
Status: DC | PRN
  Administered 2016-08-30: 3000 mL

## 2016-08-30 MED ORDER — LIDOCAINE 2% (20 MG/ML) 5 ML SYRINGE
INTRAMUSCULAR | Status: AC
  Filled 2016-08-30: qty 5

## 2016-08-30 MED ORDER — SUGAMMADEX SODIUM 200 MG/2ML IV SOLN
INTRAVENOUS | Status: AC
  Filled 2016-08-30: qty 2

## 2016-08-30 MED ORDER — ACETAMINOPHEN 650 MG RE SUPP: 650.0000 mg | Freq: Four times a day (QID) | RECTAL | Status: DC | PRN

## 2016-08-30 MED ORDER — SUGAMMADEX SODIUM 200 MG/2ML IV SOLN
INTRAVENOUS | Status: DC | PRN
  Administered 2016-08-30: 200 mg via INTRAVENOUS

## 2016-08-30 MED ORDER — ROCURONIUM BROMIDE 100 MG/10ML IV SOLN
INTRAVENOUS | Status: DC | PRN
  Administered 2016-08-30: 70 mg via INTRAVENOUS
  Administered 2016-08-30 (×2): 30 mg via INTRAVENOUS
  Administered 2016-08-30: 50 mg via INTRAVENOUS

## 2016-08-30 MED ORDER — ONDANSETRON HCL 4 MG PO TABS: 4.0000 mg | ORAL_TABLET | Freq: Four times a day (QID) | ORAL | Status: DC | PRN

## 2016-08-30 MED ORDER — METOCLOPRAMIDE HCL 5 MG/ML IJ SOLN: 5.0000 mg | Freq: Three times a day (TID) | INTRAMUSCULAR | Status: DC | PRN

## 2016-08-30 MED ORDER — BACLOFEN 10 MG PO TABS: 10.0000 mg | ORAL_TABLET | Freq: Three times a day (TID) | ORAL | 0 refills | Status: DC

## 2016-08-30 MED ORDER — ZOLPIDEM TARTRATE 5 MG PO TABS
10.0000 mg | ORAL_TABLET | Freq: Every day | ORAL | Status: DC
  Administered 2016-09-01 (×2): 10 mg via ORAL
  Filled 2016-08-30 (×3): qty 2

## 2016-08-30 MED ORDER — MENTHOL 3 MG MT LOZG: 1.0000 | LOZENGE | OROMUCOSAL | Status: DC | PRN

## 2016-08-30 MED ORDER — CEFAZOLIN SODIUM-DEXTROSE 2-4 GM/100ML-% IV SOLN
2.0000 g | Freq: Three times a day (TID) | INTRAVENOUS | Status: AC
  Administered 2016-08-30 – 2016-08-31 (×2): 2 g via INTRAVENOUS
  Filled 2016-08-30 (×2): qty 100

## 2016-08-30 MED ORDER — ALUM & MAG HYDROXIDE-SIMETH 200-200-20 MG/5ML PO SUSP: 30.0000 mL | ORAL | Status: DC | PRN

## 2016-08-30 MED ORDER — DEXAMETHASONE SODIUM PHOSPHATE 10 MG/ML IJ SOLN
10.0000 mg | Freq: Once | INTRAMUSCULAR | Status: AC
  Administered 2016-08-31: 10 mg via INTRAVENOUS
  Filled 2016-08-30: qty 1

## 2016-08-30 MED ORDER — DILTIAZEM HCL ER COATED BEADS 180 MG PO CP24
300.0000 mg | ORAL_CAPSULE | Freq: Every day | ORAL | Status: DC
  Administered 2016-08-30 – 2016-09-01 (×2): 300 mg via ORAL
  Filled 2016-08-30 (×3): qty 1

## 2016-08-30 MED ORDER — OXYCODONE-ACETAMINOPHEN 10-325 MG PO TABS: 1.0000 | ORAL_TABLET | Freq: Four times a day (QID) | ORAL | 0 refills | Status: DC | PRN

## 2016-08-30 MED ORDER — PHENYLEPHRINE 40 MCG/ML (10ML) SYRINGE FOR IV PUSH (FOR BLOOD PRESSURE SUPPORT)
PREFILLED_SYRINGE | INTRAVENOUS | Status: DC | PRN
  Administered 2016-08-30: 80 ug via INTRAVENOUS
  Administered 2016-08-30: 120 ug via INTRAVENOUS
  Administered 2016-08-30: 80 ug via INTRAVENOUS
  Administered 2016-08-30: 160 ug via INTRAVENOUS
  Administered 2016-08-30: 200 ug via INTRAVENOUS

## 2016-08-30 MED ORDER — PHENOL 1.4 % MT LIQD: 1.0000 | OROMUCOSAL | Status: DC | PRN

## 2016-08-30 SURGICAL SUPPLY — 64 items
APL SKNCLS STERI-STRIP NONHPOA (GAUZE/BANDAGES/DRESSINGS) ×1
BANDAGE ACE 6X5 VEL STRL LF (GAUZE/BANDAGES/DRESSINGS) ×3 IMPLANT
BANDAGE ESMARK 6X9 LF (GAUZE/BANDAGES/DRESSINGS) ×1 IMPLANT
BENZOIN TINCTURE PRP APPL 2/3 (GAUZE/BANDAGES/DRESSINGS) ×2 IMPLANT
BLADE SAG 18X100X1.27 (BLADE) ×2 IMPLANT
BLADE SAW SGTL 13X75X1.27 (BLADE) ×2 IMPLANT
BNDG CMPR 9X6 STRL LF SNTH (GAUZE/BANDAGES/DRESSINGS) ×1
BNDG ESMARK 6X9 LF (GAUZE/BANDAGES/DRESSINGS) ×2
BOOTCOVER CLEANROOM LRG (PROTECTIVE WEAR) ×4 IMPLANT
BOWL SMART MIX CTS (DISPOSABLE) ×2 IMPLANT
CAPT KNEE TOTAL 3 ×1 IMPLANT
CEMENT HV SMART SET (Cement) ×4 IMPLANT
CLSR STERI-STRIP ANTIMIC 1/2X4 (GAUZE/BANDAGES/DRESSINGS) ×2 IMPLANT
COVER SURGICAL LIGHT HANDLE (MISCELLANEOUS) ×2 IMPLANT
CUFF TOURNIQUET SINGLE 34IN LL (TOURNIQUET CUFF) ×2 IMPLANT
DRAPE EXTREMITY T 121X128X90 (DRAPE) ×2 IMPLANT
DRAPE U-SHAPE 47X51 STRL (DRAPES) ×2 IMPLANT
DRSG PAD ABDOMINAL 8X10 ST (GAUZE/BANDAGES/DRESSINGS) ×1 IMPLANT
DURAPREP 26ML APPLICATOR (WOUND CARE) ×2 IMPLANT
ELECT CAUTERY BLADE 6.4 (BLADE) ×2 IMPLANT
ELECT REM PT RETURN 9FT ADLT (ELECTROSURGICAL) ×2
ELECTRODE REM PT RTRN 9FT ADLT (ELECTROSURGICAL) ×1 IMPLANT
FACESHIELD STD STERILE (MASK) ×2 IMPLANT
GAUZE SPONGE 4X4 12PLY STRL (GAUZE/BANDAGES/DRESSINGS) ×2 IMPLANT
GLOVE BIOGEL PI IND STRL 8 (GLOVE) ×1 IMPLANT
GLOVE BIOGEL PI INDICATOR 8 (GLOVE) ×1
GLOVE BIOGEL PI ORTHO PRO SZ8 (GLOVE) ×1
GLOVE ORTHO TXT STRL SZ7.5 (GLOVE) ×2 IMPLANT
GLOVE PI ORTHO PRO STRL SZ8 (GLOVE) ×1 IMPLANT
GLOVE SURG ORTHO 8.0 STRL STRW (GLOVE) ×2 IMPLANT
GOWN STRL REUS W/ TWL XL LVL3 (GOWN DISPOSABLE) ×1 IMPLANT
GOWN STRL REUS W/TWL 2XL LVL3 (GOWN DISPOSABLE) ×2 IMPLANT
GOWN STRL REUS W/TWL XL LVL3 (GOWN DISPOSABLE) ×2
HANDPIECE INTERPULSE COAX TIP (DISPOSABLE) ×2
HOOD PEEL AWAY FACE SHEILD DIS (HOOD) ×4 IMPLANT
IMMOBILIZER KNEE 22 (SOFTGOODS) ×2 IMPLANT
KIT BASIN OR (CUSTOM PROCEDURE TRAY) ×2 IMPLANT
KIT ROOM TURNOVER OR (KITS) ×2 IMPLANT
MANIFOLD NEPTUNE II (INSTRUMENTS) ×2 IMPLANT
NDL 18GX1X1/2 (RX/OR ONLY) (NEEDLE) ×1 IMPLANT
NEEDLE 18GX1X1/2 (RX/OR ONLY) (NEEDLE) ×2 IMPLANT
NS IRRIG 1000ML POUR BTL (IV SOLUTION) ×2 IMPLANT
PACK TOTAL JOINT (CUSTOM PROCEDURE TRAY) ×2 IMPLANT
PAD ABD 8X10 STRL (GAUZE/BANDAGES/DRESSINGS) ×2 IMPLANT
PAD ARMBOARD 7.5X6 YLW CONV (MISCELLANEOUS) ×4 IMPLANT
PAD CAST 4YDX4 CTTN HI CHSV (CAST SUPPLIES) ×1 IMPLANT
PADDING CAST COTTON 4X4 STRL (CAST SUPPLIES) ×2
PADDING CAST COTTON 6X4 STRL (CAST SUPPLIES) ×2 IMPLANT
SET HNDPC FAN SPRY TIP SCT (DISPOSABLE) ×1 IMPLANT
SPONGE GAUZE 4X4 12PLY STER LF (GAUZE/BANDAGES/DRESSINGS) ×1 IMPLANT
SUCTION FRAZIER HANDLE 10FR (MISCELLANEOUS) ×1
SUCTION TUBE FRAZIER 10FR DISP (MISCELLANEOUS) ×1 IMPLANT
SUT MNCRL AB 4-0 PS2 18 (SUTURE) IMPLANT
SUT VIC AB 0 CT1 27 (SUTURE) ×2
SUT VIC AB 0 CT1 27XBRD ANBCTR (SUTURE) ×1 IMPLANT
SUT VIC AB 2-0 CT1 27 (SUTURE) ×2
SUT VIC AB 2-0 CT1 TAPERPNT 27 (SUTURE) ×1 IMPLANT
SUT VIC AB 3-0 SH 8-18 (SUTURE) ×4 IMPLANT
SYR 30ML LL (SYRINGE) IMPLANT
SYR 50ML LL SCALE MARK (SYRINGE) ×2 IMPLANT
TOWEL OR 17X24 6PK STRL BLUE (TOWEL DISPOSABLE) ×2 IMPLANT
TOWEL OR 17X26 10 PK STRL BLUE (TOWEL DISPOSABLE) ×2 IMPLANT
TRAY CATH 16FR W/PLASTIC CATH (SET/KITS/TRAYS/PACK) IMPLANT
WATER STERILE IRR 1000ML POUR (IV SOLUTION) ×4 IMPLANT

## 2016-08-30 NOTE — Anesthesia Procedure Notes (Signed)
Procedure Name: Intubation Date/Time: 08/30/2016 11:45 AM Performed by: Freddie Breech Pre-anesthesia Checklist: Patient identified, Emergency Drugs available, Suction available and Patient being monitored Patient Re-evaluated:Patient Re-evaluated prior to inductionOxygen Delivery Method: Circle System Utilized Preoxygenation: Pre-oxygenation with 100% oxygen Intubation Type: IV induction Ventilation: Mask ventilation without difficulty Laryngoscope Size: Glidescope and 4 Grade View: Grade I Tube type: Oral Tube size: 7.0 mm Number of attempts: 1 Airway Equipment and Method: Oral airway,  Rigid stylet and Video-laryngoscopy Placement Confirmation: positive ETCO2 and breath sounds checked- equal and bilateral (ETT location confirmed with video from glidescope. ) Secured at: 24 cm Tube secured with: Tape Dental Injury: Teeth and Oropharynx as per pre-operative assessment

## 2016-08-30 NOTE — Op Note (Signed)
DATE OF SURGERY:  08/30/2016 TIME: 2:25 PM  PATIENT NAME:  Nicholas Delacruz   AGE: 50 y.o.    PRE-OPERATIVE DIAGNOSIS:  Right knee primary localized osteoarthritis  POST-OPERATIVE DIAGNOSIS:  Same  PROCEDURE:  Procedure(s): Right TOTAL KNEE ARTHROPLASTY   SURGEON:  Johnny Bridge, MD   ASSISTANT:  Joya Gaskins, OPA-C, present and scrubbed throughout the case, critical for assistance with exposure, retraction, instrumentation, and closure.   OPERATIVE IMPLANTS: Biomet Vanguard size 83 tibia, fixed bearing with an interlocking bar, with a size 75 femur with a 10 mm vitamin E polyethylene insert and a size 34 mm patellar button.   PREOPERATIVE INDICATIONS:  Nicholas Delacruz is a 50 y.o. year old male with end stage bone on bone degenerative arthritis of the knee who failed conservative treatment, including injections, antiinflammatories, activity modification, and assistive devices, and had significant impairment of their activities of daily living, and elected for Total Knee Arthroplasty.   The risks, benefits, and alternatives were discussed at length including but not limited to the risks of infection, bleeding, nerve injury, stiffness, blood clots, the need for revision surgery, cardiopulmonary complications, among others, and they were willing to proceed.  He has had multiple previous knee arthroscopies, left knee has had 3, the right knee has had 8.  OPERATIVE FINDINGS AND UNIQUE ASPECTS OF THE CASE:  The patellar fat pad was extremely scarred down, and mobility was very challenging. The supratrochlear fat pad was also very hypertrophic, with synovial hypertrophy, which was excised.  There were fairly significant posterior hypertrophic osteophytes on both condyles, and extensive grade 4 changes throughout the knee. My initial cuts were very conservative, and in fact I removed an additional 5 mm off of the femur and an additional 4 mm off of the tibia during the second round of  cuts.  OPERATIVE DESCRIPTION:  The patient was brought to the operative room and placed in a supine position.   spinal anesthesia was attempted, but aborted, and general anesthesia was administered.  IV antibiotics were given.   he had a preoperative positive MRSA screen for MRSA as well as staph aureus, had received appropriate mupirocin decolonization and received 3 g of Ancef and also approximately one half of a bag of vancomycin at the time the tourniquet was inflated. The vancomycin was 1.5 g.   The lower extremity was prepped and draped in the usual sterile fashion.  Time out was performed.  The leg was elevated and exsanguinated and the tourniquet was inflated.  Anterior quadriceps tendon splitting approach was performed.  The patella was everted and osteophytes were removed.  The anterior horn of the medial and lateral meniscus was removed.   The distal femur was opened with the drill and the intramedullary distal femoral cutting jig was utilized, set at 5 degrees resecting 10 mm off the distal femur.  Care was taken to protect the collateral ligaments.  Then the extramedullary tibial cutting jig was utilized making the appropriate cut using the anterior tibial crest as a reference building in appropriate posterior slope.  Care was taken during the cut to protect the medial and collateral ligaments.  The proximal tibia was removed along with the posterior horns of the menisci.  The PCL was sacrificed.     the gaps were too tight, and I went back and resected more femur and tibia placing the jig at its deepest position, and cutting through the proximal slot on the femoral jig.  The extensor gap was measured and was  approximately 8mm.    The distal femoral sizing jig was applied, taking care to avoid notching.  Then the 4-in-1 cutting jig was applied and the anterior and posterior femur was cut, along with the chamfer cuts.  All posterior osteophytes were removed.  The flexion gap was then  measured and was symmetric with the extension gap.  I completed the distal femoral preparation using the appropriate jig to prepare the box.  The patella was then measured, and cut with the saw.  The thickness before the cut was 21 and after the cut was 14.  The proximal tibia sized and prepared accordingly with the reamer and the punch, and then all components were trialed with the 20mm poly insert.  The knee was found to have excellent balance and full motion.    The above named components were then cemented into place and all excess cement was removed.  The real polyethylene implant was placed.  After the cement had cured I released the tourniquet and confirmed excellent hemostasis with no major posterior vessel injury.    The knee was easily taken through a range of motion and the patella tracked well and the knee irrigated copiously and the parapatellar and subcutaneous tissue closed with vicryl, and monocryl with steri strips for the skin.  The wounds were injected with marcaine, and dressed with sterile gauze and the patient was awakened and returned to the PACU in stable and satisfactory condition.  There were no complications.  Total tourniquet time was 120 minutes.

## 2016-08-30 NOTE — Anesthesia Procedure Notes (Signed)
Anesthesia Regional Block:  Adductor canal block  Pre-Anesthetic Checklist: ,, timeout performed, Correct Patient, Correct Site, Correct Laterality, Correct Procedure, Correct Position, site marked, Risks and benefits discussed,  Surgical consent,  Pre-op evaluation,  At surgeon's request and post-op pain management  Laterality: Right  Prep: chloraprep       Needles:   Needle Type: Echogenic Needle     Needle Length: 5cm 5 cm Needle Gauge: 22 and 22 G    Additional Needles:  Procedures: ultrasound guided (picture in chart) Adductor canal block Narrative:  Injection made incrementally with aspirations every 25 mL.  Performed by: Personally  Anesthesiologist: Reginal Lutes  Additional Notes: Patient tolerated procedure well.

## 2016-08-30 NOTE — Anesthesia Preprocedure Evaluation (Addendum)
Anesthesia Evaluation  Patient identified by MRN, date of birth, ID band Patient awake    Reviewed: Allergy & Precautions, NPO status , Patient's Chart, lab work & pertinent test results, reviewed documented beta blocker date and time   History of Anesthesia Complications (+) PONV and history of anesthetic complications  Airway Mallampati: III  TM Distance: >3 FB Neck ROM: Full    Dental  (+) Teeth Intact, Dental Advisory Given   Pulmonary sleep apnea and Continuous Positive Airway Pressure Ventilation ,    breath sounds clear to auscultation       Cardiovascular hypertension, Pt. on medications and Pt. on home beta blockers  Rhythm:Regular Rate:Normal     Neuro/Psych    GI/Hepatic GERD  Medicated,  Endo/Other  diabetesMorbid obesity  Renal/GU      Musculoskeletal  (+) Arthritis ,   Abdominal (+) + obese,   Peds  Hematology   Anesthesia Other Findings   Reproductive/Obstetrics                             Anesthesia Physical  Anesthesia Plan  ASA: III  Anesthesia Plan: Spinal   Post-op Pain Management:    Induction:   Airway Management Planned:   Additional Equipment:   Intra-op Plan:   Post-operative Plan:   Informed Consent: I have reviewed the patients History and Physical, chart, labs and discussed the procedure including the risks, benefits and alternatives for the proposed anesthesia with the patient or authorized representative who has indicated his/her understanding and acceptance.     Plan Discussed with:   Anesthesia Plan Comments: (Will attempt spinal first, general if unable to place)       Anesthesia Quick Evaluation

## 2016-08-30 NOTE — Transfer of Care (Signed)
Immediate Anesthesia Transfer of Care Note  Patient: Nicholas Delacruz  Procedure(s) Performed: Procedure(s): TOTAL KNEE ARTHROPLASTY (Right)  Patient Location: PACU  Anesthesia Type:General  Level of Consciousness:  sedated, patient cooperative and responds to stimulation  Airway & Oxygen Therapy:Patient Spontanous Breathing and Patient connected to face mask oxgen  Post-op Assessment:  Report given to PACU RN and Post -op Vital signs reviewed and stable  Post vital signs:  Reviewed and stable  Last Vitals:  Vitals:   08/30/16 1030 08/30/16 1453  BP: (!) 119/47   Pulse: 66   Resp: 18   Temp:  [id number] C    Complications: No apparent anesthesia complications

## 2016-08-30 NOTE — H&P (Signed)
PREOPERATIVE H&P  Chief Complaint: djd right knee  HPI: Nicholas Delacruz is a 50 y.o. male who presents for preoperative history and physical with a diagnosis of djd right knee. Symptoms are rated as moderate to severe, and have been worsening.  This is significantly impairing activities of daily living.  He has elected for surgical management.   He has failed injections, activity modification, anti-inflammatories, and assistive devices.  Preoperative X-rays demonstrate end stage degenerative changes with osteophyte formation, loss of joint space, subchondral sclerosis.  He has had multiple previous knee arthroscopies, a total of 3 done on the left side, right knee has had a few arthroscopies.  Past Medical History:  Diagnosis Date  . Diabetes mellitus without complication ([place])   . GERD (gastroesophageal reflux disease)   . High blood pressure   . Obstructive sleep apnea on CPAP   . PONV (postoperative nausea and vomiting)    blood pressure dropped in recovery   Past Surgical History:  Procedure Laterality Date  . APPENDECTOMY    . KNEE ARTHROSCOPY Right 01/02/2015   Procedure: RIGHT KNEE ARTHROSCOPY, LATERAL AND MEDIAL MENISECTOMY;  Surgeon: Carole Civil, MD;  Location: [facility];  Service: Orthopedics;  Laterality: Right;  . KNEE ARTHROSCOPY WITH MEDIAL MENISECTOMY Left 06/25/2014   Procedure: KNEE ARTHROSCOPY WITH MEDIAL MENISECTOMY;  Surgeon: Carole Civil, MD;  Location: [facility];  Service: Orthopedics;  Laterality: Left;  . KNEE SURGERY Left   . MENISCUS DEBRIDEMENT Right 01/02/2015   Procedure: DEBRIDEMENT OF RIGHT KNEE JOINT;  Surgeon: Carole Civil, MD;  Location: [facility];  Service: Orthopedics;  Laterality: Right;  . right knee sark    . SHOULDER SURGERY     left   Social History   Social History  . Marital status: Divorced    Spouse name: N/A  . Number of children: N/A  . Years of education: 41 th grad   Occupational History  . [employer]     Social History Main Topics  . Smoking status: Never Smoker  . Smokeless tobacco: Never Used  . Alcohol use Yes     Comment: socially  . Drug use: No  . Sexual activity: Yes    Birth control/ protection: None   Other Topics Concern  . Not on file   Social History Narrative  . No narrative on file   Family History  Problem Relation Age of Onset  . Alzheimer's disease Father   . Arthritis     Allergies  Allergen Reactions  . Levofloxacin Anaphylaxis   Prior to Admission medications   Medication Sig Start Date End Date Taking? Authorizing Provider  aspirin EC 81 MG tablet Take 81 mg by mouth daily.   Yes Historical Provider, MD  diltiazem (CARDIZEM CD) 300 MG 24 hr capsule Take 300 mg by mouth at bedtime.  05/25/16  Yes Historical Provider, MD  fluticasone (FLONASE) 50 MCG/ACT nasal spray Place 1 spray into both nostrils daily as needed for allergies or rhinitis.   Yes Historical Provider, MD  HYDROcodone-acetaminophen (NORCO/VICODIN) 5-325 MG tablet Take 1 tablet by mouth every 4 (four) hours as needed for moderate pain.   Yes Historical Provider, MD  ibuprofen (ADVIL,MOTRIN) 200 MG tablet Take 400 mg by mouth every 8 (eight) hours as needed for mild pain.    Yes Historical Provider, MD  labetalol (NORMODYNE) 200 MG tablet Take 200 mg by mouth 2 (two) times daily.  06/28/11  Yes Historical Provider, MD  losartan (COZAAR) 100 MG  tablet Take 100 mg by mouth daily.   Yes Historical Provider, MD  metFORMIN (GLUCOPHAGE) 500 MG tablet Take 500 mg by mouth 2 (two) times daily with a meal.   Yes Historical Provider, MD  RABEprazole (ACIPHEX) 20 MG tablet Take 20 mg by mouth daily.   Yes Historical Provider, MD  zolpidem (AMBIEN) 10 MG tablet Take 10 mg by mouth at bedtime.    Yes Historical Provider, MD     Positive ROS: All other systems have been reviewed and were otherwise negative with the exception of those mentioned in the HPI and as above.  Physical Exam:  Estimated body mass  index is 42.79 kg/m as calculated from the following:   Height as of this encounter: 6' (1.829 m).   Weight as of this encounter: 143.1 kg (315 lb 8 oz).  General: Alert, no acute distress Cardiovascular: No pedal edema Respiratory: No cyanosis, no use of accessory musculature GI: No organomegaly, abdomen is soft and non-tender Skin: No lesions in the area of chief complaint Neurologic: Sensation intact distally Psychiatric: Patient is competent for consent with normal mood and affect Lymphatic: No axillary or cervical lymphadenopathy  MUSCULOSKELETAL: Right knee has range of motion 0-100 with intact stability, significant stiffness, positive crepitance with pain throughout the range of motion. Well-healed previous surgical wounds.  Assessment: Right knee primary localized osteoarthritis   Plan: Plan for Procedure(s): TOTAL KNEE ARTHROPLASTY  The risks benefits and alternatives were discussed with the patient including but not limited to the risks of nonoperative treatment, versus surgical intervention including infection, bleeding, nerve injury,  blood clots, cardiopulmonary complications, morbidity, mortality, among others, and they were willing to proceed.   Johnny Bridge, MD Cell [phone]   08/30/2016 10:02 AM

## 2016-08-31 ENCOUNTER — Encounter (HOSPITAL_COMMUNITY): Payer: Self-pay | Admitting: Orthopedic Surgery

## 2016-08-31 LAB — CBC
HCT: 33.1 % — ABNORMAL LOW (ref 39.0–52.0)
Hemoglobin: 10.3 g/dL — ABNORMAL LOW (ref 13.0–17.0)
MCH: 27.1 pg (ref 26.0–34.0)
MCHC: 31.1 g/dL (ref 30.0–36.0)
MCV: 87.1 fL (ref 78.0–100.0)
Platelets: 350 10*3/uL (ref 150–400)
RBC: 3.8 MIL/uL — ABNORMAL LOW (ref 4.22–5.81)
RDW: 13.9 % (ref 11.5–15.5)
WBC: 11.5 10*3/uL — ABNORMAL HIGH (ref 4.0–10.5)

## 2016-08-31 LAB — BASIC METABOLIC PANEL
Anion gap: 13 (ref 5–15)
BUN: 13 mg/dL (ref 6–20)
CO2: 24 mmol/L (ref 22–32)
Calcium: 8.8 mg/dL — ABNORMAL LOW (ref 8.9–10.3)
Chloride: 95 mmol/L — ABNORMAL LOW (ref 101–111)
Creatinine, Ser: 0.88 mg/dL (ref 0.61–1.24)
GFR calc Af Amer: >60 mL/min (ref >60)
GFR calc non Af Amer: >60 mL/min (ref >60)
Glucose, Bld: 171 mg/dL — ABNORMAL HIGH (ref 65–99)
Potassium: 4 mmol/L (ref 3.5–5.1)
Sodium: 132 mmol/L — ABNORMAL LOW (ref 135–145)

## 2016-08-31 LAB — GLUCOSE, CAPILLARY
Glucose-Capillary: 150 mg/dL — ABNORMAL HIGH (ref 65–99)
Glucose-Capillary: 183 mg/dL — ABNORMAL HIGH (ref 65–99)
Glucose-Capillary: 233 mg/dL — ABNORMAL HIGH (ref 65–99)
Glucose-Capillary: 236 mg/dL — ABNORMAL HIGH (ref 65–99)

## 2016-08-31 NOTE — Evaluation (Signed)
Physical Therapy Evaluation Patient Details Name: Nicholas Delacruz MRN: [id number] DOB: 05-Mar-1966 Today's Date: 08/31/2016   History of Present Illness  50 y.o. male now s/p Rt TKA. PMH: multiple knee surgeries, HTN, DM.    Clinical Impression  Pt is s/p TKA resulting in the deficits listed below (see PT Problem List). Able to ambulate 6 feet during initial session. Pt will benefit from skilled PT to increase their independence and safety with mobility to allow discharge to SNF for further rehabilitation prior to returning home.      Follow Up Recommendations SNF;Supervision for mobility/OOB    Equipment Recommendations  Rolling walker with 5" wheels (further assessed at next venue)    Recommendations for Other Services       Precautions / Restrictions Precautions Precautions: Knee;Fall Precaution Booklet Issued: Yes (comment) Precaution Comments: HEP provided, reviewed knee extension precautions Required Braces or Orthoses: Knee Immobilizer - Right Knee Immobilizer - Right: On when out of bed or walking per pt report Restrictions Weight Bearing Restrictions: Yes RLE Weight Bearing: Weight bearing as tolerated      Mobility  Bed Mobility Overal bed mobility: Needs Assistance Bed Mobility: Supine to Sit     Supine to sit: Min guard     General bed mobility comments: HOB elevated, using rail to assist  Transfers Overall transfer level: Needs assistance Equipment used: Rolling walker (2 wheeled) Transfers: Sit to/from Stand Sit to Stand: Min guard;From elevated surface         General transfer comment: Cues for hand position, bed elevated.   Ambulation/Gait Ambulation/Gait assistance: Min guard Ambulation Distance (Feet): 6 Feet Assistive device: Rolling walker (2 wheeled) Gait Pattern/deviations: Step-to pattern;Decreased weight shift to right Gait velocity: decreased   General Gait Details: Pt with guarded pattern, good stability, distance limited by reports  of pain.   Stairs            Wheelchair Mobility    Modified Rankin (Stroke Patients Only)       Balance Overall balance assessment: Needs assistance Sitting-balance support: No upper extremity supported Sitting balance-Leahy Scale: Good     Standing balance support: Bilateral upper extremity supported Standing balance-Leahy Scale: Poor Standing balance comment: using rw                             Pertinent Vitals/Pain Pain Assessment: 0-10 Pain Score: 7  Pain Location: Rt knee Pain Descriptors / Indicators: Aching;Burning Pain Intervention(s): Limited activity within patient's tolerance;Monitored during session;RN gave pain meds during session    Home Living Family/patient expects to be discharged to:: Skilled nursing facility Living Arrangements: Alone Available Help at Discharge:  (none) Type of Home: House Home Access: Stairs to enter Entrance Stairs-Rails: Left Entrance Stairs-Number of Steps: 3 Home Layout: One level Home Equipment: Walker - 4 wheels Additional Comments: walker was his fathers    Prior Function Level of Independence: Independent               Hand Dominance        Extremity/Trunk Assessment   Upper Extremity Assessment: Overall WFL for tasks assessed           Lower Extremity Assessment: RLE deficits/detail RLE Deficits / Details: able to perform SLR       Communication   Communication: No difficulties  Cognition Arousal/Alertness: Awake/alert Behavior During Therapy: WFL for tasks assessed/performed Overall Cognitive Status: Within Functional Limits for tasks assessed  General Comments      Exercises        Assessment/Plan    PT Assessment Patient needs continued PT services  PT Diagnosis Difficulty walking   PT Problem List Decreased strength;Decreased range of motion;Decreased activity tolerance;Decreased balance;Decreased mobility  PT Treatment  Interventions DME instruction;Gait training;Stair training;Functional mobility training;Therapeutic activities;Therapeutic exercise;Patient/family education   PT Goals (Current goals can be found in the Care Plan section) Acute Rehab PT Goals Patient Stated Goal: less pain and move better PT Goal Formulation: With patient Time For Goal Achievement: 09/14/16 Potential to Achieve Goals: Good    Frequency 7X/week   Barriers to discharge Decreased caregiver support      Co-evaluation               End of Session Equipment Utilized During Treatment: Gait belt;Right knee immobilizer Activity Tolerance: Patient limited by pain Patient left: in chair;with call bell/phone within reach;with family/visitor present Nurse Communication: Mobility status;Weight bearing status         Time: 0912-0959 PT Time Calculation (min) (ACUTE ONLY): 47 min   Charges:   PT Evaluation $PT Eval Moderate Complexity: 1 Procedure PT Treatments $Therapeutic Activity: 23-37 mins   PT G Codes:        Cassell Clement, PT, CSCS Pager [phone] Office [phone]  08/31/2016, 10:31 AM

## 2016-08-31 NOTE — Progress Notes (Signed)
Inpatient Diabetes Program Recommendations  AACE/ADA: New Consensus Statement on Inpatient Glycemic Control (2015)  Target Ranges:  Prepandial:   less than 140 mg/dL      Peak postprandial:   less than 180 mg/dL (1-2 hours)      Critically ill patients:  140 - 180 mg/dL   Lab Results  Component Value Date   GLUCAP 183 (H) 08/31/2016   HGBA1C 6.4 (H) 08/19/2016    Review of Glycemic Control:  Results for Nicholas Delacruz, Nicholas Delacruz (MRN [id number]) as of 08/31/2016 13:35  Ref. Range 08/30/2016 14:55 08/30/2016 16:29 08/30/2016 22:28 08/31/2016 06:25 08/31/2016 11:38  Glucose-Capillary Latest Ref Range: 65 - 99 mg/dL 212 (H) 215 (H) 188 (H) 150 (H) 183 (H)   Inpatient Diabetes Program Recommendations:    Consider adding Lantus 20 units daily while patient is in the hospital.  Will not need insulin at home based on A1C.    Thanks, Adah Perl, RN, BC-ADM Inpatient Diabetes Coordinator Pager [phone]

## 2016-08-31 NOTE — Care Management Note (Signed)
Case Management Note  Patient Details  Name: Nicholas Delacruz MRN: [id number] Date of Birth: [birth year]  Subjective/Objective:     50 yr old male s/p right total knee arthroplasty.              Action/Plan: Patient will need shortterm rehab at [facility]. Social worker is aware.   Expected Discharge Date:    09/01/16              Expected Discharge Plan:   Skilled Nursing Facility  In-House Referral:  Clinical Social Work  Discharge planning Services  CM Consult  Post Acute Care Choice:  NA Choice offered to:  NA  DME Arranged:  N/A DME Agency:  NA  HH Arranged:  NA HH Agency:  [facility] (now [facility])  Status of Service:  Completed, signed off  If discussed at [employer] of Stay Meetings, dates discussed:    Additional Comments:  Ninfa Meeker, RN 08/31/2016, 1:58 PM

## 2016-08-31 NOTE — Evaluation (Signed)
Occupational Therapy Evaluation Patient Details Name: Nicholas Delacruz MRN: [id number] DOB: 1966/05/08 Today's Date: 08/31/2016    History of Present Illness 50 y.o. male now s/p Rt TKA. PMH: multiple knee surgeries, HTN, DM.     Clinical Impression   Pt was independent prior to admission. Presents with post operative knee pain, generalized weakness and impaired standing balance interfering with ability to perform self care and mobility at his baseline. Pt lives alone and will need post acute, short term rehab prior to return home. Will defer further OT to SNF.    Follow Up Recommendations  SNF;Supervision/Assistance - 24 hour    Equipment Recommendations       Recommendations for Other Services       Precautions / Restrictions Precautions Precautions: Knee;Fall Required Braces or Orthoses: Knee Immobilizer - Right Knee Immobilizer - Right: On when out of bed or walking Restrictions Weight Bearing Restrictions: Yes RLE Weight Bearing: Weight bearing as tolerated      Mobility Bed Mobility Overal bed mobility: Needs Assistance Bed Mobility: Supine to Sit;Sit to Supine     Supine to sit: Min guard Sit to supine: Min guard   General bed mobility comments: pt able to manage his R LE without assist, used rail, HOB up  Transfers Overall transfer level: Needs assistance Equipment used: Rolling walker (2 wheeled) Transfers: Sit to/from [facility] Sit to Stand: Min guard;From elevated surface Stand pivot transfers: Min guard       General transfer comment: Cues for hand position, bed elevated, used momentum from chair    Balance     Sitting balance-Leahy Scale: Good       Standing balance-Leahy Scale: Poor                              ADL Overall ADL's : Needs assistance/impaired Eating/Feeding: Independent;Sitting   Grooming: Wash/dry hands;Wash/dry face;Sitting;Set up   Upper Body Bathing: Set up;Sitting   Lower Body  Bathing: Minimal assistance;Sit to/from stand   Upper Body Dressing : Set up;Sitting   Lower Body Dressing: Minimal assistance;Sit to/from stand   Toilet Transfer: Min guard;Stand-pivot;RW           Functional mobility during ADLs:  (pivoted only) General ADL Comments: Pt educated in the scope of OT and compensatory strategies for LB ADL including use of AE.      Vision     Perception     Praxis      Pertinent Vitals/Pain Pain Assessment: 0-10 Pain Score: 6  Pain Location: R knee Pain Descriptors / Indicators: Aching Pain Intervention(s): Repositioned;Ice applied;Monitored during session     Hand Dominance Right   Extremity/Trunk Assessment Upper Extremity Assessment Upper Extremity Assessment: Overall WFL for tasks assessed   Lower Extremity Assessment Lower Extremity Assessment: Defer to PT evaluation       Communication Communication Communication: No difficulties   Cognition Arousal/Alertness: Awake/alert Behavior During Therapy: WFL for tasks assessed/performed Overall Cognitive Status: Within Functional Limits for tasks assessed                     General Comments       Exercises Exercises: Total Joint     Shoulder Instructions      Home Living Family/patient expects to be discharged to:: Skilled nursing facility Living Arrangements: Alone  Prior Functioning/Environment Level of Independence: Independent             OT Diagnosis: Generalized weakness;Acute pain   OT Problem List: Decreased strength;Decreased activity tolerance;Impaired balance (sitting and/or standing);Decreased knowledge of use of DME or AE;Obesity;Pain   OT Treatment/Interventions:      OT Goals(Current goals can be found in the care plan section) Acute Rehab OT Goals Patient Stated Goal: return home after rehab at [facility]  OT Frequency:     Barriers to D/C:            Co-evaluation               End of Session Equipment Utilized During Treatment: Right knee immobilizer;Gait belt;Rolling walker  Activity Tolerance: Patient tolerated treatment well Patient left: in bed;with call bell/phone within reach   Time: [id number] OT Time Calculation (min): 27 min Charges:  OT General Charges $OT Visit: 1 Procedure OT Evaluation $OT Eval Moderate Complexity: 1 Procedure OT Treatments $Self Care/Home Management : 8-22 mins G-Codes:    Malka So 08/31/2016, 3:40 PM  [phone]

## 2016-08-31 NOTE — Progress Notes (Signed)
PT Progress Note  Pt refusing ambulation during second PT session, states that he wants to get some sleep and just got back to bed. Pt was willing to work on HEP. Instructed pt to get up with nursing this evening since he did not get up with PT. Recommending SNF for further rehabilitation following acute stay.     08/31/16 1459  PT Visit Information  Last PT Received On 08/31/16  Assistance Needed +1  History of Present Illness 50 y.o. male now s/p Rt TKA. PMH: multiple knee surgeries, HTN, DM.    Subjective Data  Subjective I just got back to bed. Reports being extremely tired.   Patient Stated Goal get some sleep  Precautions  Precautions Knee;Fall  Required Braces or Orthoses Knee Immobilizer - Right  Knee Immobilizer - Right On when out of bed or walking  Restrictions  Weight Bearing Restrictions Yes  RLE Weight Bearing WBAT  Pain Assessment  Pain Assessment 0-10  Pain Score 7  Pain Location Rt knee  Pain Descriptors / Indicators Aching  Pain Intervention(s) Limited activity within patient's tolerance;Monitored during session  Cognition  Arousal/Alertness Awake/alert  Behavior During Therapy WFL for tasks assessed/performed  Overall Cognitive Status Within Functional Limits for tasks assessed  Bed Mobility  General bed mobility comments pt refusing OOB activity  Exercises  Exercises Total Joint  Total Joint Exercises  Ankle Circles/Pumps AROM;Both;10 reps  Quad Sets Strengthening;Right;10 reps  Heel Slides AAROM;Right;10 reps  Hip ABduction/ADduction Strengthening;Right;10 reps  Straight Leg Raises Strengthening;Right;Both (mod assist)  Goniometric ROM 57 degrees knee flexion  PT - End of Session  Equipment Utilized During Treatment (pt requests having immobilizer on after session)  Activity Tolerance Patient tolerated treatment well  Patient left in bed;with call bell/phone within reach;with SCD's reapplied  Nurse Communication Mobility status;Weight bearing status   PT - Assessment/Plan  PT Plan Current plan remains appropriate  PT Frequency (ACUTE ONLY) 7X/week  Follow Up Recommendations SNF;Supervision for mobility/OOB  PT equipment Rolling walker with 5" wheels  PT Goal Progression  Progress towards PT goals Progressing toward goals  Acute Rehab PT Goals  PT Goal Formulation With patient  Time For Goal Achievement 09/14/16  Potential to Achieve Goals Good  PT Time Calculation  PT Start Time (ACUTE ONLY) 1332  PT Stop Time (ACUTE ONLY) 1351  PT Time Calculation (min) (ACUTE ONLY) 19 min  PT General Charges  $$ ACUTE PT VISIT 1 Procedure  PT Treatments  $Therapeutic Exercise 8-22 mins  Cassell Clement, PT, CSCS Pager [phone] Office [phone]

## 2016-08-31 NOTE — Progress Notes (Signed)
Patient ID: Nicholas Delacruz, male   DOB: [birth year], 50 y.o.   MRN: [id number]     Subjective:  Patient reports pain as mild to moderate.  Patient in bed and resting denies any CP or SOB  Objective:   VITALS:   Vitals:   08/30/16 1605 08/30/16 2015 08/31/16 0030 08/31/16 0500  BP:  138/79 130/70 (!) 118/54  Pulse: 65 70 68 73  Resp: 19 18 18 18   Temp: 98.5 F (36.9 C) 97.8 F (36.6 C) 98 F (36.7 C) 98.3 F (36.8 C)  TempSrc:  Oral Oral Oral  SpO2: 96% 99% 97% 99%  Weight:      Height:        ABD soft Sensation intact distally Dorsiflexion/Plantar flexion intact Incision: dressing C/D/I and no drainage   Lab Results  Component Value Date   WBC 11.5 (H) 08/31/2016   HGB 10.3 (L) 08/31/2016   HCT 33.1 (L) 08/31/2016   MCV 87.1 08/31/2016   PLT 350 08/31/2016   BMET    Component Value Date/Time   NA 132 (L) 08/31/2016 0543   K 4.0 08/31/2016 0543   CL 95 (L) 08/31/2016 0543   CO2 24 08/31/2016 0543   GLUCOSE 171 (H) 08/31/2016 0543   BUN 13 08/31/2016 0543   CREATININE 0.88 08/31/2016 0543   CALCIUM 8.8 (L) 08/31/2016 0543   GFRNONAA >60 08/31/2016 0543   GFRAA >60 08/31/2016 0543     Assessment/Plan: 1 Day Post-Op   Principal Problem:   Primary localized osteoarthritis of right knee Active Problems:   S/P total knee arthroplasty   Advance diet Up with therapy Plan for discharge tomorrow or Friday WBAT Dry dressing PRN and plan for dressing change tomorrow    Remonia Richter 08/31/2016, 8:27 AM  Seen and agree with above.   Marchia Bond, MD Cell [phone]

## 2016-09-01 LAB — GLUCOSE, CAPILLARY
Glucose-Capillary: 161 mg/dL — ABNORMAL HIGH (ref 65–99)
Glucose-Capillary: 170 mg/dL — ABNORMAL HIGH (ref 65–99)
Glucose-Capillary: 188 mg/dL — ABNORMAL HIGH (ref 65–99)
Glucose-Capillary: 147 mg/dL — ABNORMAL HIGH (ref 65–99)

## 2016-09-01 LAB — CBC
HCT: 34.3 % — ABNORMAL LOW (ref 39.0–52.0)
Hemoglobin: 10.8 g/dL — ABNORMAL LOW (ref 13.0–17.0)
MCH: 27.4 pg (ref 26.0–34.0)
MCHC: 31.5 g/dL (ref 30.0–36.0)
MCV: 87.1 fL (ref 78.0–100.0)
Platelets: 359 10*3/uL (ref 150–400)
RBC: 3.94 MIL/uL — ABNORMAL LOW (ref 4.22–5.81)
RDW: 13.9 % (ref 11.5–15.5)
WBC: 16.1 10*3/uL — ABNORMAL HIGH (ref 4.0–10.5)

## 2016-09-01 NOTE — Progress Notes (Signed)
Patient ID: Nicholas Delacruz, male   DOB: [birth year], 50 y.o.   MRN: [id number]     Subjective:  Patient reports pain as mild.  Patient denies any CP or SOB states that he is improving today  Objective:   VITALS:   Vitals:   08/31/16 2057 08/31/16 2125 09/01/16 0433 09/01/16 1401  BP: (!) 134/49  (!) 144/62 110/80  Pulse: 70 75 72 75  Resp: 18  18 20   Temp: 98.3 F (36.8 C)  97.9 F (36.6 C) 98.6 F (37 C)  TempSrc: Oral  Oral Oral  SpO2: 98%  98% 94%  Weight:      Height:        ABD soft Sensation intact distally Dorsiflexion/Plantar flexion intact Incision: dressing C/D/I and no drainage Dressing removed and dry dressing applied no sign of infection  Lab Results  Component Value Date   WBC 16.1 (H) 09/01/2016   HGB 10.8 (L) 09/01/2016   HCT 34.3 (L) 09/01/2016   MCV 87.1 09/01/2016   PLT 359 09/01/2016   BMET    Component Value Date/Time   NA 132 (L) 08/31/2016 0543   K 4.0 08/31/2016 0543   CL 95 (L) 08/31/2016 0543   CO2 24 08/31/2016 0543   GLUCOSE 171 (H) 08/31/2016 0543   BUN 13 08/31/2016 0543   CREATININE 0.88 08/31/2016 0543   CALCIUM 8.8 (L) 08/31/2016 0543   GFRNONAA >60 08/31/2016 0543   GFRAA >60 08/31/2016 0543     Assessment/Plan: 2 Days Post-Op   Principal Problem:   Primary localized osteoarthritis of right knee Active Problems:   S/P total knee arthroplasty   Advance diet Up with therapy Plan for discharge tomorrow WBAT Dry dressing PRN   Remonia Richter 09/01/2016, 5:21 PM   Marchia Bond, MD Cell [phone]

## 2016-09-01 NOTE — Clinical Social Work Placement (Signed)
   CLINICAL SOCIAL WORK PLACEMENT  NOTE  Date:  09/01/2016  Patient Details  Name: Nicholas Delacruz MRN: [id number] Date of Birth: Apr 27, 1966  Clinical Social Work is seeking post-discharge placement for this patient at the [place] level of care (*CSW will initial, date and re-position this form in  chart as items are completed):  Yes   Patient/family provided with [facility] Work Department's list of facilities offering this level of care within the geographic area requested by the patient (or if unable, by the patient's family).  Yes   Patient/family informed of their freedom to choose among providers that offer the needed level of care, that participate in Medicare, Medicaid or managed care program needed by the patient, have an available bed and are willing to accept the patient.  Yes   Patient/family informed of [facility]'s ownership interest in [facility] and [facility], as well as of the fact that they are under no obligation to receive care at these facilities.  PASRR submitted to EDS on 09/01/16     PASRR number received on 09/01/16     Existing PASRR number confirmed on       FL2 transmitted to all facilities in geographic area requested by pt/family on 09/01/16     FL2 transmitted to all facilities within larger geographic area on       Patient informed that his/her managed care company has contracts with or will negotiate with certain facilities, including the following:        Yes   Patient/family informed of bed offers received.  Patient chooses bed at [facility]     Physician recommends and patient chooses bed at      Patient to be transferred to [facility] on 09/01/16.  Patient to be transferred to facility by [facility]     Patient family notified on 09/02/16 of transfer.  Name of family member notified:  patient is alert and oriented x4 and agreeable to update family as needed     PHYSICIAN Please prepare  priority discharge summary, including medications     Additional Comment:    _______________________________________________ Dulcy Fanny, LCSW 09/01/2016, 11:20 AM

## 2016-09-01 NOTE — Anesthesia Postprocedure Evaluation (Signed)
Anesthesia Post Note  Patient: Nicholas Delacruz  Procedure(s) Performed: Procedure(s) (LRB): TOTAL KNEE ARTHROPLASTY (Right)  Patient location during evaluation: PACU Anesthesia Type: Spinal Level of consciousness: oriented and awake and alert Pain management: pain level controlled Vital Signs Assessment: post-procedure vital signs reviewed and stable Respiratory status: spontaneous breathing, respiratory function stable and patient connected to nasal cannula oxygen Cardiovascular status: blood pressure returned to baseline and stable Postop Assessment: no headache and no backache Anesthetic complications: no     Last Vitals:  Vitals:   08/31/16 2125 09/01/16 0433  BP:  (!) 144/62  Pulse: 75 72  Resp:  18  Temp:  36.6 C    Last Pain:  Vitals:   09/01/16 0514  TempSrc:   PainSc: 5    Pain Goal: Patients Stated Pain Goal: 3 (09/01/16 0414)               Reginal Lutes

## 2016-09-01 NOTE — Progress Notes (Signed)
Physical Therapy Treatment Patient Details Name: Nicholas Delacruz MRN: [id number] DOB: [birth year] Today's Date: 09/01/2016    History of Present Illness 50 y.o. male now s/p Rt TKA. PMH: multiple knee surgeries, HTN, DM.      PT Comments    Pt required cues for encouragement to participate.  Pt remains focused on pain but able to advance gait distance.  Continue to recommend short term Rehab at SNF to improve strength and promote functional independence.    Follow Up Recommendations  SNF;Supervision for mobility/OOB     Equipment Recommendations  Rolling walker with 5" wheels    Recommendations for Other Services       Precautions / Restrictions Precautions Precautions: Knee;Fall Precaution Booklet Issued: Yes (comment) Precaution Comments: HEP provided, reviewed knee extension precautions Required Braces or Orthoses: Knee Immobilizer - Right (did not use) Restrictions Weight Bearing Restrictions: Yes RLE Weight Bearing: Weight bearing as tolerated    Mobility  Bed Mobility Overal bed mobility: Needs Assistance Bed Mobility: Supine to Sit     Supine to sit: Supervision     General bed mobility comments: Increased time with poor trunk control but able to correct LOB in seated position without assist.  Likely response due to pain.  Pt used non operative limb to advance RLE.    Transfers Overall transfer level: Needs assistance Equipment used: Rolling walker (2 wheeled) Transfers: Sit to/from Stand Sit to Stand: Min guard         General transfer comment: Cues for hand placement, bed placed in chair height position.  Good technique for stand to sit with good eccentric loading.  Cues to advance RLE forward.    Ambulation/Gait Ambulation/Gait assistance: Min guard Ambulation Distance (Feet): 80 Feet Assistive device: Rolling walker (2 wheeled) Gait Pattern/deviations: Step-through pattern;Decreased stance time - right;Decreased stride length;Antalgic;Trunk  flexed Gait velocity: decreased Gait velocity interpretation: Below normal speed for age/gender General Gait Details: Cues for gait symmetry, pushing RW vs. picking up, upper trunk control and RW position.     Stairs            Wheelchair Mobility    Modified Rankin (Stroke Patients Only)       Balance     Sitting balance-Leahy Scale: Good       Standing balance-Leahy Scale: Fair                      Cognition Arousal/Alertness: Awake/alert Behavior During Therapy: WFL for tasks assessed/performed Overall Cognitive Status: Within Functional Limits for tasks assessed                      Exercises Total Joint Exercises Ankle Circles/Pumps: AROM;Both;10 reps Quad Sets: Strengthening;Right;10 reps Heel Slides: AAROM;Right;10 reps Hip ABduction/ADduction: Strengthening;Right;10 reps Straight Leg Raises: Strengthening;Right;Both (min assist) Goniometric ROM: 62 degrees flexion R knee    General Comments        Pertinent Vitals/Pain Pain Assessment: 0-10 Pain Score: 8  Pain Location: R knee cap Pain Descriptors / Indicators: Burning;Grimacing;Guarding Pain Intervention(s): Monitored during session;Repositioned;Ice applied    Home Living                      Prior Function            PT Goals (current goals can now be found in the care plan section) Acute Rehab PT Goals Patient Stated Goal: return home after rehab at [facility] Potential to Achieve Goals: Good Progress towards PT  goals: Progressing toward goals    Frequency       PT Plan Current plan remains appropriate    Co-evaluation             End of Session Equipment Utilized During Treatment: Gait belt Activity Tolerance: Patient tolerated treatment well Patient left: in bed;with call bell/phone within reach;with SCD's reapplied     Time: [id number] PT Time Calculation (min) (ACUTE ONLY): 27 min  Charges:  $Gait Training: 8-22 mins $Therapeutic  Exercise: 8-22 mins                    G Codes:      Cristela Blue 09-27-2016, 3:12 PM  Governor Rooks, PTA pager [phone]

## 2016-09-01 NOTE — Discharge Summary (Signed)
Physician Discharge Summary  Patient ID: Nicholas Delacruz MRN: [id number] DOB/AGE: 04-22-1966 50 y.o.  Admit date: 08/30/2016 Discharge date: 09/01/2016  Admission Diagnoses:  Primary localized osteoarthritis of right knee  Discharge Diagnoses:  Principal Problem:   Primary localized osteoarthritis of right knee Active Problems:   S/P total knee arthroplasty   Past Medical History:  Diagnosis Date  . Diabetes mellitus without complication ([place])   . GERD (gastroesophageal reflux disease)   . High blood pressure   . Obstructive sleep apnea on CPAP   . PONV (postoperative nausea and vomiting)    blood pressure dropped in recovery    Surgeries: Procedure(s): TOTAL KNEE ARTHROPLASTY on 08/30/2016   Consultants (if any):   Discharged Condition: Improved  Hospital Course: Nicholas Delacruz is an 50 y.o. male who was admitted 08/30/2016 with a diagnosis of Primary localized osteoarthritis of right knee and went to the operating room on 08/30/2016 and underwent the above named procedures.    He was given perioperative antibiotics:  Anti-infectives    Start     Dose/Rate Route Frequency Ordered Stop   08/30/16 2300  vancomycin (VANCOCIN) IVPB 1000 mg/200 mL premix     1,000 mg 200 mL/hr over 60 Minutes Intravenous Every 12 hours 08/30/16 1636 08/30/16 2306   08/30/16 1645  ceFAZolin (ANCEF) IVPB 2g/100 mL premix     2 g 200 mL/hr over 30 Minutes Intravenous Every 8 hours 08/30/16 1636 08/31/16 0446   08/30/16 1030  ceFAZolin (ANCEF) 3 g in dextrose 5 % 50 mL IVPB     3 g 130 mL/hr over 30 Minutes Intravenous To ShortStay Surgical 08/26/16 1150 08/30/16 1148   08/30/16 1015  vancomycin (VANCOCIN) 1,500 mg in sodium chloride 0.9 % 500 mL IVPB     1,500 mg 250 mL/hr over 120 Minutes Intravenous To ShortStay Surgical 08/30/16 1001 08/30/16 1308    .  He was given sequential compression devices, early ambulation, and xarelto for DVT prophylaxis.  He benefited maximally from the  hospital stay and there were no complications.    Recent vital signs:  Vitals:   09/01/16 0433 09/01/16 1401  BP: (!) 144/62 110/80  Pulse: 72 75  Resp: 18 20  Temp: 97.9 F (36.6 C) 98.6 F (37 C)    Recent laboratory studies:  Lab Results  Component Value Date   HGB 10.8 (L) 09/01/2016   HGB 10.3 (L) 08/31/2016   HGB 12.7 (L) 08/19/2016   Lab Results  Component Value Date   WBC 16.1 (H) 09/01/2016   PLT 359 09/01/2016   No results found for: INR Lab Results  Component Value Date   NA 132 (L) 08/31/2016   K 4.0 08/31/2016   CL 95 (L) 08/31/2016   CO2 24 08/31/2016   BUN 13 08/31/2016   CREATININE 0.88 08/31/2016   GLUCOSE 171 (H) 08/31/2016    Discharge Medications:     Medication List    STOP taking these medications   aspirin EC 81 MG tablet   HYDROcodone-acetaminophen 5-325 MG tablet Commonly known as:  NORCO/VICODIN   ibuprofen 200 MG tablet Commonly known as:  ADVIL,MOTRIN     TAKE these medications   AMBIEN 10 MG tablet Generic drug:  zolpidem Take 10 mg by mouth at bedtime.   baclofen 10 MG tablet Commonly known as:  LIORESAL Take 1 tablet (10 mg total) by mouth 3 (three) times daily. As needed for muscle spasm   diltiazem 300 MG 24 hr capsule Commonly known as:  CARDIZEM CD Take 300 mg by mouth at bedtime.   fluticasone 50 MCG/ACT nasal spray Commonly known as:  FLONASE Place 1 spray into both nostrils daily as needed for allergies or rhinitis.   labetalol 200 MG tablet Commonly known as:  NORMODYNE Take 200 mg by mouth 2 (two) times daily.   losartan 100 MG tablet Commonly known as:  COZAAR Take 100 mg by mouth daily.   metFORMIN 500 MG tablet Commonly known as:  GLUCOPHAGE Take 500 mg by mouth 2 (two) times daily with a meal.   ondansetron 4 MG tablet Commonly known as:  ZOFRAN Take 1 tablet (4 mg total) by mouth every 8 (eight) hours as needed for nausea or vomiting.   oxyCODONE-acetaminophen 10-325 MG tablet Commonly  known as:  PERCOCET Take 1-2 tablets by mouth every 6 (six) hours as needed for pain. MAXIMUM TOTAL ACETAMINOPHEN DOSE IS 4000 MG PER DAY   RABEprazole 20 MG tablet Commonly known as:  ACIPHEX Take 20 mg by mouth daily.   rivaroxaban 10 MG Tabs tablet Commonly known as:  XARELTO Take 1 tablet (10 mg total) by mouth daily.   sennosides-docusate sodium 8.6-50 MG tablet Commonly known as:  SENOKOT-S Take 2 tablets by mouth daily.       Diagnostic Studies: Dg Knee Right Port  Result Date: 08/30/2016 CLINICAL DATA:  Postop knee replacement EXAM: PORTABLE RIGHT KNEE - 1-2 VIEW COMPARISON:  08/17/2015 FINDINGS: Right knee replacement in satisfactory position and alignment. Gas in the soft tissues. Joint effusion Negative for fracture.  No immediate complication. IMPRESSION: Satisfactory right knee replacement. Electronically Signed   By: Franchot Gallo M.D.   On: 08/30/2016 17:06    Disposition: 01-Home or Self Care    Follow-up Information    Nicholas Delacruz P, MD. Schedule an appointment as soon as possible for a visit in 2 weeks.   Specialty:  Orthopedic Surgery Contact information: [place] [address] [phone]            Signed: Johnny Bridge 09/01/2016, 4:35 PM

## 2016-09-01 NOTE — Clinical Social Work Note (Signed)
Clinical Social Work Assessment  Patient Details  Name: Nicholas Delacruz MRN: [id number] Date of Birth: [birth year]  Date of referral:  09/01/16               Reason for consult:  Facility Placement                Permission sought to share information with:  Chartered certified accountant granted to share information::  Yes, Verbal Permission Granted  Name::        Agency::   ([place] [facility])  Relationship::     Contact Information:     Housing/Transportation Living arrangements for the past 2 months:  [place] of Information:  Patient Patient Interpreter Needed:  None Criminal Activity/Legal Involvement Pertinent to Current Situation/Hospitalization:  No - Comment as needed Significant Relationships:  Siblings, Parents Lives with:  Self Do you feel safe going back to the place where you live?  No Need for family participation in patient care:  No (Coment)  Care giving concerns:  No caregivers present at time of this assessment.   Social Worker assessment / plan:  CSW met with patient to discuss disposition.  Patient states he is from home alone and is agreeable to PT's recommendation of SNF at time of discharge.  Patient states he has very limited supports that can assist at time of discharge.  Patient has been set up with [facility] prior to surgery.  [name] is aware of patient's admission and agreeable to complete admission's paperwork at bedside today to prepare for discharge tomorrow (Friday 9/8).    Employment status:    Forensic scientist:   Sports administrator) PT Recommendations:    Information / Referral to community resources:  [place]  Patient/Family's Response to care:  Patient is agreeable to SNF.  Patient/Family's Understanding of and Emotional Response to Diagnosis, Current Treatment, and Prognosis:  Patient is relieved to know that he has a place to receive STR at time of discharge and that he does not have to  inconvenience his family "any more than he already has".  CSW offered support during this difficult transition of care.  Emotional Assessment Appearance:  Appears stated age Attitude/Demeanor/Rapport:    Affect (typically observed):  Accepting Orientation:  Oriented to Self, Oriented to Place, Oriented to  Time, Oriented to Situation Alcohol / Substance use:  Not Applicable Psych involvement (Current and /or in the community):     Discharge Needs  Concerns to be addressed:  No discharge needs identified Readmission within the last 30 days:  No Current discharge risk:  None Barriers to Discharge:  Ship broker, Continued Medical Work up   [employer], LCSW 09/01/2016, 11:18 AM

## 2016-09-01 NOTE — NC FL2 (Signed)
[place] LEVEL OF CARE SCREENING TOOL     IDENTIFICATION  Patient Name: Nicholas Delacruz Birthdate: 1966-08-16 Sex: male Admission Date (Current Location): 08/30/2016  [facility] and [place] Number:  [employer] and Address:  [facility] [place], [place] [address], [place], [place] [address]      Provider Number: [id number]  Attending Physician Name and Address:  Marchia Bond, MD  Relative Name and Phone Number:       Current Level of Care: Hospital Recommended Level of Care: [place] Prior Approval Number:    Date Approved/Denied:   PASRR Number: [id number]  Discharge Plan: SNF    Current Diagnoses: Patient Active Problem List   Diagnosis Date Noted  . S/P total knee arthroplasty 08/30/2016  . Weakness of left leg 03/13/2014  . Difficulty in walking(719.7) 03/13/2014  . Arthritis of knee, left 01/21/2014  . Cervicalgia 11/08/2011  . RHEUMATOID ARTHRITIS 02/01/2011  . RUPTURE ROTATOR CUFF 02/01/2011  . IMPINGEMENT SYNDROME 12/07/2010  . Primary localized osteoarthritis of right knee 09/28/2009  . HIGH BLOOD PRESSURE 05/13/2009    Orientation RESPIRATION BLADDER Height & Weight     Self, Time, Situation, Place  Normal Continent Weight: (!) 315 lb 8 oz (143.1 kg) Height:  6' (182.9 cm)  BEHAVIORAL SYMPTOMS/MOOD NEUROLOGICAL BOWEL NUTRITION STATUS      Continent Diet  AMBULATORY STATUS COMMUNICATION OF NEEDS Skin   Limited Assist Verbally Surgical wounds                       Personal Care Assistance Level of Assistance  Dressing, Bathing Bathing Assistance: Limited assistance   Dressing Assistance: Limited assistance     Functional Limitations Info             SPECIAL CARE FACTORS FREQUENCY  PT (By licensed PT), OT (By licensed OT)     PT Frequency: daily OT Frequency: daily            Contractures Contractures Info: Not present    Additional Factors Info  Allergies, Isolation  Precautions   Allergies Info: levofloxacin     Isolation Precautions Info: MRSA      Current Medications (09/01/2016):  This is the current hospital active medication list Current Facility-Administered Medications  Medication Dose Route Frequency Provider Last Rate Last Dose  . 0.45 % NaCl with KCl 20 mEq / L infusion   Intravenous Continuous Marchia Bond, MD 75 mL/hr at 08/31/16 [phone]    . acetaminophen (TYLENOL) tablet 650 mg  650 mg Oral Q6H PRN Marchia Bond, MD       Or  . acetaminophen (TYLENOL) suppository 650 mg  650 mg Rectal Q6H PRN Marchia Bond, MD      . alum & mag hydroxide-simeth (MAALOX/MYLANTA) 200-200-20 MG/5ML suspension 30 mL  30 mL Oral Q4H PRN Marchia Bond, MD      . bisacodyl (DULCOLAX) suppository 10 mg  10 mg Rectal Daily PRN Marchia Bond, MD      . diltiazem (CARDIZEM CD) 24 hr capsule 300 mg  300 mg Oral QHS Marchia Bond, MD   300 mg at 08/30/16 2204  . diphenhydrAMINE (BENADRYL) 12.5 MG/5ML elixir 12.5-25 mg  12.5-25 mg Oral Q4H PRN Marchia Bond, MD      . docusate sodium (COLACE) capsule 100 mg  100 mg Oral BID Marchia Bond, MD   100 mg at 09/01/16 0836  . fluticasone (FLONASE) 50 MCG/ACT nasal spray 1 spray  1  spray Each Nare Daily PRN Marchia Bond, MD      . HYDROmorphone (DILAUDID) injection 0.5 mg  0.5 mg Intravenous Q2H PRN Marchia Bond, MD   0.5 mg at 08/30/16 1935  . insulin aspart (novoLOG) injection 0-15 Units  0-15 Units Subcutaneous TID WC Marchia Bond, MD   3 Units at 09/01/16 [phone]  . labetalol (NORMODYNE) tablet 200 mg  200 mg Oral BID Marchia Bond, MD   200 mg at 09/01/16 [id number]  . losartan (COZAAR) tablet 100 mg  100 mg Oral Daily Marchia Bond, MD   100 mg at 09/01/16 0839  . magnesium citrate solution 1 Bottle  1 Bottle Oral Once PRN Marchia Bond, MD      . menthol-cetylpyridinium (CEPACOL) lozenge 3 mg  1 lozenge Oral PRN Marchia Bond, MD       Or  . phenol (CHLORASEPTIC) mouth spray 1 spray  1 spray Mouth/Throat PRN Marchia Bond, MD       . metFORMIN (GLUCOPHAGE) tablet 500 mg  500 mg Oral BID WC Marchia Bond, MD   500 mg at 09/01/16 0836  . methocarbamol (ROBAXIN) tablet 500 mg  500 mg Oral Q6H PRN Marchia Bond, MD   500 mg at 09/01/16 0934   Or  . methocarbamol (ROBAXIN) 500 mg in dextrose 5 % 50 mL IVPB  500 mg Intravenous Q6H PRN Marchia Bond, MD      . metoCLOPramide (REGLAN) tablet 5-10 mg  5-10 mg Oral Q8H PRN Marchia Bond, MD       Or  . metoCLOPramide (REGLAN) injection 5-10 mg  5-10 mg Intravenous Q8H PRN Marchia Bond, MD      . ondansetron [facility]) tablet 4 mg  4 mg Oral Q6H PRN Marchia Bond, MD       Or  . ondansetron [facility]) injection 4 mg  4 mg Intravenous Q6H PRN Marchia Bond, MD      . oxyCODONE (Oxy IR/ROXICODONE) immediate release tablet 5-10 mg  5-10 mg Oral Q3H PRN Marchia Bond, MD   10 mg at 09/01/16 0837  . pantoprazole (PROTONIX) EC tablet 40 mg  40 mg Oral Daily Marchia Bond, MD   40 mg at 09/01/16 0839  . polyethylene glycol (MIRALAX / GLYCOLAX) packet 17 g  17 g Oral Daily PRN Marchia Bond, MD      . rivaroxaban [facility]) tablet 10 mg  10 mg Oral Q breakfast Marchia Bond, MD   10 mg at 09/01/16 0835  . senna (SENOKOT) tablet 8.6 mg  1 tablet Oral BID Marchia Bond, MD   8.6 mg at 09/01/16 0837  . zolpidem (AMBIEN) tablet 10 mg  10 mg Oral QHS Marchia Bond, MD   10 mg at 09/01/16 0048     Discharge Medications: Please see discharge summary for a list of discharge medications.  Relevant Imaging Results:  Relevant Lab Results:   Additional Information SSN: [id number]  Dulcy Fanny, LCSW

## 2016-09-02 LAB — GLUCOSE, CAPILLARY: Glucose-Capillary: 137 mg/dL — ABNORMAL HIGH (ref 65–99)

## 2016-09-02 LAB — CBC
HCT: 35.7 % — ABNORMAL LOW (ref 39.0–52.0)
Hemoglobin: 11 g/dL — ABNORMAL LOW (ref 13.0–17.0)
MCH: 27.1 pg (ref 26.0–34.0)
MCHC: 30.8 g/dL (ref 30.0–36.0)
MCV: 87.9 fL (ref 78.0–100.0)
Platelets: 369 10*3/uL (ref 150–400)
RBC: 4.06 MIL/uL — ABNORMAL LOW (ref 4.22–5.81)
RDW: 14.2 % (ref 11.5–15.5)
WBC: 12.1 10*3/uL — ABNORMAL HIGH (ref 4.0–10.5)

## 2016-09-02 NOTE — Clinical Social Work Note (Signed)
Patient will discharge today per MD order. Patient will discharge to: [facility] SN[facility] RN to call report prior to transportation to: [place]: familiy  CSW sent discharge summary to SNF for review.    Nonnie Done, LCSW ([id number]) [id number]  Licensed Clinical Social Worker

## 2016-09-02 NOTE — Progress Notes (Signed)
Patient discharged to [facility] with instructions, and reported to nurse [place].

## 2016-09-02 NOTE — Clinical Social Work Placement (Signed)
   CLINICAL SOCIAL WORK PLACEMENT  NOTE  Date:  09/02/2016  Patient Details  Name: Nicholas Delacruz MRN: [id number] Date of Birth: 1966/07/26  Clinical Social Work is seeking post-discharge placement for this patient at the [place] level of care (*CSW will initial, date and re-position this form in  chart as items are completed):  Yes   Patient/family provided with [facility] Work Department's list of facilities offering this level of care within the geographic area requested by the patient (or if unable, by the patient's family).  Yes   Patient/family informed of their freedom to choose among providers that offer the needed level of care, that participate in Medicare, Medicaid or managed care program needed by the patient, have an available bed and are willing to accept the patient.  Yes   Patient/family informed of [facility]'s ownership interest in [facility] and [facility], as well as of the fact that they are under no obligation to receive care at these facilities.  PASRR submitted to EDS on 09/01/16     PASRR number received on 09/01/16     Existing PASRR number confirmed on       FL2 transmitted to all facilities in geographic area requested by pt/family on 09/01/16     FL2 transmitted to all facilities within larger geographic area on       Patient informed that his/her managed care company has contracts with or will negotiate with certain facilities, including the following:        Yes   Patient/family informed of bed offers received.  Patient chooses bed at [facility]     Physician recommends and patient chooses bed at      Patient to be transferred to [facility] on 09/01/16.  Patient to be transferred to facility by family- sister     Patient family notified on 09/02/16 of transfer.  Name of family member notified:  patient is alert and oriented x4 and agreeable to update family as needed     PHYSICIAN Please  prepare priority discharge summary, including medications     Additional Comment:    _______________________________________________ Dulcy Fanny, LCSW 09/02/2016, 10:29 AM

## 2016-09-02 NOTE — Progress Notes (Signed)
Patient ID: ARLINE PERSON, male   DOB: 05/10/1966, 50 y.o.   MRN: [id number]     Subjective:  Patient reports pain as mild.  Patient reports that he is doing much better and is ready to go to rehab  Objective:   VITALS:   Vitals:   09/01/16 0433 09/01/16 1401 09/01/16 2028 09/02/16 0543  BP: (!) 144/62 110/80 (!) 148/53 132/61  Pulse: 72 75 73 (!) 59  Resp: 18 20 20 20   Temp: 97.9 F (36.6 C) 98.6 F (37 C) 98.2 F (36.8 C) 98.4 F (36.9 C)  TempSrc: Oral Oral Oral Oral  SpO2: 98% 94% 99% 97%  Weight:      Height:        ABD soft Sensation intact distally Dorsiflexion/Plantar flexion intact Incision: dressing C/D/I and no drainage   Lab Results  Component Value Date   WBC 12.1 (H) 09/02/2016   HGB 11.0 (L) 09/02/2016   HCT 35.7 (L) 09/02/2016   MCV 87.9 09/02/2016   PLT 369 09/02/2016   BMET    Component Value Date/Time   NA 132 (L) 08/31/2016 0543   K 4.0 08/31/2016 0543   CL 95 (L) 08/31/2016 0543   CO2 24 08/31/2016 0543   GLUCOSE 171 (H) 08/31/2016 0543   BUN 13 08/31/2016 0543   CREATININE 0.88 08/31/2016 0543   CALCIUM 8.8 (L) 08/31/2016 0543   GFRNONAA >60 08/31/2016 0543   GFRAA >60 08/31/2016 0543     Assessment/Plan: 3 Days Post-Op   Principal Problem:   Primary localized osteoarthritis of right knee Active Problems:   S/P total knee arthroplasty   Advance diet Up with therapy Discharge to SNF WBAT Dry dressing PRN   Remonia Richter 09/02/2016, 8:07 AM   Marchia Bond, MD Cell [phone]

## 2016-09-05 ENCOUNTER — Non-Acute Institutional Stay (SKILLED_NURSING_FACILITY): Payer: 59 | Admitting: Internal Medicine

## 2016-09-05 ENCOUNTER — Encounter: Payer: Self-pay | Admitting: Internal Medicine

## 2016-09-05 DIAGNOSIS — R2681 Unsteadiness on feet: Secondary | ICD-10-CM | POA: Diagnosis not present

## 2016-09-05 DIAGNOSIS — D72829 Elevated white blood cell count, unspecified: Secondary | ICD-10-CM

## 2016-09-05 DIAGNOSIS — K219 Gastro-esophageal reflux disease without esophagitis: Secondary | ICD-10-CM

## 2016-09-05 DIAGNOSIS — E669 Obesity, unspecified: Secondary | ICD-10-CM | POA: Diagnosis not present

## 2016-09-05 DIAGNOSIS — K5900 Constipation, unspecified: Secondary | ICD-10-CM | POA: Diagnosis not present

## 2016-09-05 DIAGNOSIS — E871 Hypo-osmolality and hyponatremia: Secondary | ICD-10-CM

## 2016-09-05 DIAGNOSIS — M1711 Unilateral primary osteoarthritis, right knee: Secondary | ICD-10-CM

## 2016-09-05 DIAGNOSIS — G4700 Insomnia, unspecified: Secondary | ICD-10-CM

## 2016-09-05 DIAGNOSIS — E119 Type 2 diabetes mellitus without complications: Secondary | ICD-10-CM

## 2016-09-05 DIAGNOSIS — I10 Essential (primary) hypertension: Secondary | ICD-10-CM

## 2016-09-05 DIAGNOSIS — D62 Acute posthemorrhagic anemia: Secondary | ICD-10-CM

## 2016-09-05 DIAGNOSIS — E1169 Type 2 diabetes mellitus with other specified complication: Secondary | ICD-10-CM

## 2016-09-05 NOTE — Progress Notes (Signed)
LOCATION: Nicholas Delacruz  PCP: Purvis Kilts, MD   Code Status: Full Code  Goals of care: Advanced Directive information Advanced Directives 08/19/2016  Does patient have an advance directive? No  Would patient like information on creating an advanced directive? No - patient declined information  Pre-existing out of facility DNR order (yellow form or pink MOST form) -       Extended Emergency Contact Information Primary Emergency Contact: Saltzman,Roberta Address: [address] [place], [place] [address] [place] Phone: [phone] Relation: Mother Secondary Emergency Contact: Harrison,Sue Address: [address]          [place], [place] [address] [place] Phone: [phone] Work Phone: [phone] Mobile Phone: [phone] Relation: Sister   Allergies  Allergen Reactions  . Levofloxacin Anaphylaxis    Chief Complaint  Patient presents with  . New Admit To SNF    New Admission     HPI:  Patient is a 50 y.o. male seen today for short term rehabilitation post hospital admission from 08/30/16-09/01/16 with right knee osteoarthritis. He underwent right total knee arthroplasty. He is seen in his room today.   Review of Systems:  Constitutional: Negative for fever, chills, diaphoresis. Energy level is slowly coming back. HENT: Negative for headache, congestion, nasal discharge Eyes: Negative for blurred vision, double vision and discharge.  Respiratory: Negative for cough, shortness of breath and wheezing.   Cardiovascular: Negative for chest pain, palpitations, leg swelling.  Gastrointestinal: Negative for heartburn,vomiting, abdominal pain. Positive for some nausea. Last bowel movement was today.  Genitourinary: Negative for dysuria and flank pain.  Musculoskeletal: Negative for back pain, fall in the facility. pain medication has been helpful. Skin: Negative for itching, rash.  Neurological: Negative for  dizziness. Psychiatric/Behavioral: Negative for depression    Past Medical History:  Diagnosis Date  . Diabetes mellitus without complication ([place])   . GERD (gastroesophageal reflux disease)   . High blood pressure   . Obstructive sleep apnea on CPAP   . PONV (postoperative nausea and vomiting)    blood pressure dropped in recovery   Past Surgical History:  Procedure Laterality Date  . APPENDECTOMY    . KNEE ARTHROSCOPY Right 01/02/2015   Procedure: RIGHT KNEE ARTHROSCOPY, LATERAL AND MEDIAL MENISECTOMY;  Surgeon: Carole Civil, MD;  Location: [facility];  Service: Orthopedics;  Laterality: Right;  . KNEE ARTHROSCOPY WITH MEDIAL MENISECTOMY Left 06/25/2014   Procedure: KNEE ARTHROSCOPY WITH MEDIAL MENISECTOMY;  Surgeon: Carole Civil, MD;  Location: [facility];  Service: Orthopedics;  Laterality: Left;  . KNEE SURGERY Left   . MENISCUS DEBRIDEMENT Right 01/02/2015   Procedure: DEBRIDEMENT OF RIGHT KNEE JOINT;  Surgeon: Carole Civil, MD;  Location: [facility];  Service: Orthopedics;  Laterality: Right;  . right knee sark    . SHOULDER SURGERY     left  . TOTAL KNEE ARTHROPLASTY Right 08/30/2016   Procedure: TOTAL KNEE ARTHROPLASTY;  Surgeon: Marchia Bond, MD;  Location: [place] [facility];  Service: Orthopedics;  Laterality: Right;   Social History:   reports that he has never smoked. He has never used smokeless tobacco. He reports that he drinks alcohol. He reports that he does not use drugs.  Family History  Problem Relation Age of Onset  . Alzheimer's disease Father   . Arthritis      Medications:   Medication List       Accurate as of 09/05/16  2:33 PM. Always use your most recent med list.          AMBIEN 10 MG tablet Generic drug:  zolpidem Take 10 mg by mouth at bedtime.   baclofen 10 MG tablet Commonly known as:  LIORESAL Take 1 tablet (10 mg total) by mouth 3 (three) times daily. As needed for muscle spasm   diltiazem 300 MG 24 hr capsule Commonly known as:  CARDIZEM  CD Take 300 mg by mouth at bedtime.   fluticasone 50 MCG/ACT nasal spray Commonly known as:  FLONASE Place 1 spray into both nostrils daily as needed for allergies or rhinitis.   labetalol 200 MG tablet Commonly known as:  NORMODYNE Take 200 mg by mouth 2 (two) times daily.   losartan 100 MG tablet Commonly known as:  COZAAR Take 100 mg by mouth daily.   metFORMIN 500 MG tablet Commonly known as:  GLUCOPHAGE Take 500 mg by mouth 2 (two) times daily with a meal.   ondansetron 4 MG tablet Commonly known as:  ZOFRAN Take 1 tablet (4 mg total) by mouth every 8 (eight) hours as needed for nausea or vomiting.   oxyCODONE-acetaminophen 5-325 MG tablet Commonly known as:  PERCOCET/ROXICET Take 2 tablets by mouth every 6 (six) hours as needed for moderate pain or severe pain (until percocet 10/325 comes in).   oxyCODONE-acetaminophen 10-325 MG tablet Commonly known as:  PERCOCET Take 1-2 tablets by mouth every 6 (six) hours as needed for pain. MAXIMUM TOTAL ACETAMINOPHEN DOSE IS 4000 MG PER DAY   RABEprazole 20 MG tablet Commonly known as:  ACIPHEX Take 20 mg by mouth daily.   rivaroxaban 10 MG Tabs tablet Commonly known as:  XARELTO Take 1 tablet (10 mg total) by mouth daily.   sennosides-docusate sodium 8.6-50 MG tablet Commonly known as:  SENOKOT-S Take 2 tablets by mouth daily.       Immunizations:  There is no immunization history on file for this patient.   Physical Exam:  Vitals:   09/05/16 1428  BP: (!) 141/72  Pulse: 76  Resp: 20  Temp: 97.2 F (36.2 C)  TempSrc: Oral  Weight: (!) 322 lb 6.4 oz (146.2 kg)  Height: 6' (1.829 m)   Body mass index is 43.73 kg/m.  General- adult male, morbidly obese, in no acute distress Head- normocephalic, atraumatic Nose- no maxillary or frontal sinus tenderness, no nasal discharge Throat- moist mucus membrane  Eyes- PERRLA, EOMI, no pallor, no icterus, no discharge, normal conjunctiva, normal sclera Neck- no  cervical lymphadenopathy Cardiovascular- normal s1,s2, no murmur, trace leg edema Respiratory- bilateral clear to auscultation, no wheeze, no rhonchi, no crackles, no use of accessory muscles Abdomen- bowel sounds present, soft, non tender Musculoskeletal- able to move all 4 extremities, limited right knee range of motion Neurological- alert and oriented to person, place and time Skin- warm and dry, right knee surgical incision with mepilex dressing Psychiatry- normal mood and affect    Labs reviewed: Basic Metabolic Panel:  Recent Labs  08/19/16 1259 08/31/16 0543  NA 137 132*  K 4.2 4.0  CL 104 95*  CO2 26 24  GLUCOSE 147* 171*  BUN 15 13  CREATININE 1.10 0.88  CALCIUM 9.9 8.8*   Liver Function Tests: No results for input(s): AST, ALT, ALKPHOS, BILITOT, PROT, ALBUMIN in the last 8760 hours. No results for input(s): LIPASE, AMYLASE in the last 8760 hours. No results for input(s): AMMONIA in the last 8760 hours. CBC:  Recent Labs  08/31/16 0543 09/01/16  0407 09/02/16 0523  WBC 11.5* 16.1* 12.1*  HGB 10.3* 10.8* 11.0*  HCT 33.1* 34.3* 35.7*  MCV 87.1 87.1 87.9  PLT 350 359 369   Cardiac Enzymes: No results for input(s): CKTOTAL, CKMB, CKMBINDEX, TROPONINI in the last 8760 hours. BNP: Invalid input(s): POCBNP CBG:  Recent Labs  09/01/16 1616 09/01/16 2111 09/02/16 0641  GLUCAP 188* 147* 137*    Radiological Exams: Dg Knee Right Port  Result Date: 08/30/2016 CLINICAL DATA:  Postop knee replacement EXAM: PORTABLE RIGHT KNEE - 1-2 VIEW COMPARISON:  08/17/2015 FINDINGS: Right knee replacement in satisfactory position and alignment. Gas in the soft tissues. Joint effusion Negative for fracture.  No immediate complication. IMPRESSION: Satisfactory right knee replacement. Electronically Signed   By: Franchot Gallo M.D.   On: 08/30/2016 17:06    Assessment/Plan  unsteady gait With right knee OA s/p surgical repair. Will have patient work with PT/OT as tolerated  to regain strength and restore function.  Fall precautions are in place.  Right knee OA S/p right total knee arthroplasty. Has orthopedic follow up. Will have him work with physical therapy and occupational therapy team to help with gait training and muscle strengthening exercises.fall precautions. Skin care. Encourage to be out of bed. Continue percocet 10-325 mg 1-2 tab q6h prn pain. Continue xarelto for DVT prophylaxis. Continue baclofen 10 mg tid prn muscle spasm.   Blood loss anemia Post op, check cbc  Leukocytosis Afebrile, monitor cbc with diff, likely reactive  Hyponatremia aaox 3. Monitor bmp  Dm type 2  Lab Results  Component Value Date   HGBA1C 6.4 (H) 08/19/2016   Monitor cbg, continue metformin 500 mg bid.  HTN Continue labetalol 200 mg bid, losartan 100 mg daily and diltiazem 300 mg daily. Check bp q shift and check bmp  gerd Stable, continue rabeprazole 20 mg daily  Constipation Stable, continue senokot s 2 tab qd  Insomnia Continue ambien   Goals of care: short term rehabilitation   Labs/tests ordered: cbc, cmp 09/06/16   Family/ staff Communication: reviewed care plan with patient and nursing supervisor    Blanchie Serve, MD Internal Medicine [place], [place] [address] Cell Phone (Monday-Friday 8 am - 5 pm): [phone] On Call: [phone] and follow prompts after 5 pm and on weekends Office Phone: [phone] Office Fax: [phone]

## 2016-09-07 ENCOUNTER — Other Ambulatory Visit: Payer: Self-pay | Admitting: *Deleted

## 2016-09-07 MED ORDER — OXYCODONE-ACETAMINOPHEN 10-325 MG PO TABS: ORAL_TABLET | ORAL | 0 refills | Status: DC

## 2016-09-07 NOTE — Telephone Encounter (Signed)
[facility] [phone] Fax: [phone]  

## 2016-09-13 ENCOUNTER — Non-Acute Institutional Stay (SKILLED_NURSING_FACILITY): Payer: 59 | Admitting: Family

## 2016-09-13 DIAGNOSIS — K5901 Slow transit constipation: Secondary | ICD-10-CM

## 2016-09-13 DIAGNOSIS — G4700 Insomnia, unspecified: Secondary | ICD-10-CM | POA: Diagnosis not present

## 2016-09-13 DIAGNOSIS — Z96651 Presence of right artificial knee joint: Secondary | ICD-10-CM

## 2016-09-13 DIAGNOSIS — R2681 Unsteadiness on feet: Secondary | ICD-10-CM | POA: Diagnosis not present

## 2016-09-13 DIAGNOSIS — I10 Essential (primary) hypertension: Secondary | ICD-10-CM

## 2016-09-13 DIAGNOSIS — E669 Obesity, unspecified: Secondary | ICD-10-CM | POA: Diagnosis not present

## 2016-09-13 DIAGNOSIS — E119 Type 2 diabetes mellitus without complications: Secondary | ICD-10-CM

## 2016-09-13 DIAGNOSIS — E1169 Type 2 diabetes mellitus with other specified complication: Secondary | ICD-10-CM

## 2016-09-13 NOTE — Progress Notes (Signed)
Location:  [place]:  SNF [phone])  Provider: Marlowe Sax, NP  PCP: Purvis Kilts, MD Patient Care Team: Sharilyn Sites, MD as PCP - General [facility] Medicine)  Extended Emergency Contact Information Primary Emergency Contact: Sevcik,Roberta Address: [address] [place]          [place], [place] [address] [place] Phone: [phone] Relation: Mother Secondary Emergency Contact: Harrison,Sue Address: [address]          [place], [place] [address] [place] of [place] Phone: [phone] Work Phone: [phone] Mobile Phone: [phone] Relation: Sister  Code Status:Full Code  Goals of care:  Advanced Directive information Advanced Directives 08/19/2016  Does patient have an advance directive? No  Would patient like information on creating an advanced directive? No - patient declined information  Pre-existing out of facility DNR order (yellow form or pink MOST form) -     Allergies  Allergen Reactions  . Levofloxacin Anaphylaxis    Chief Complaint  Patient presents with  . Discharge Note    Discharge from [facility]    HPI:  50 y.o. male seen today at [facility] for discharge home. He was here for short term rehabilitation from 08/30/16-09/01/16 with right knee osteoarthritis. He underwent right total knee arthroplasty on 08/30/2016. He has a medical History of HTN, insomnia, GERD, Type 2 DM Arthritis among other conditions. He is seen in his room today. He denies any acute issues this visit. He states right knee pain under control with current regimen. He has had unremarkable stay here during his Rehab.He has worked well with PT/OT now stable for discharge home.He will be discharged home with Home health PT/OT to continue with ROM, Exercise, Gait stability and muscle strengthening. He will  require  DME: Cane  to allow her to maintain current level of independence with ADL's. Home health  services will be arranged by facility social worker prior to discharge. He will be discharged with medication from the facility.Prescription medication will be written x 1 month then patient to follow up with PCP in 1-2 weeks. Facility staff report no new concerns.      Past Medical History:  Diagnosis Date  . Diabetes mellitus without complication ([place])   . GERD (gastroesophageal reflux disease)   . High blood pressure   . Obstructive sleep apnea on CPAP   . PONV (postoperative nausea and vomiting)    blood pressure dropped in recovery    Past Surgical History:  Procedure Laterality Date  . APPENDECTOMY    . KNEE ARTHROSCOPY Right 01/02/2015   Procedure: RIGHT KNEE ARTHROSCOPY, LATERAL AND MEDIAL MENISECTOMY;  Surgeon: Carole Civil, MD;  Location: [facility];  Service: Orthopedics;  Laterality: Right;  . KNEE ARTHROSCOPY WITH MEDIAL MENISECTOMY Left 06/25/2014   Procedure: KNEE ARTHROSCOPY WITH MEDIAL MENISECTOMY;  Surgeon: Carole Civil, MD;  Location: [facility];  Service: Orthopedics;  Laterality: Left;  . KNEE SURGERY Left   . MENISCUS DEBRIDEMENT Right 01/02/2015   Procedure: DEBRIDEMENT OF RIGHT KNEE JOINT;  Surgeon: Carole Civil, MD;  Location: [facility];  Service: Orthopedics;  Laterality: Right;  . right knee sark    . SHOULDER SURGERY     left  . TOTAL KNEE ARTHROPLASTY Right 08/30/2016   Procedure: TOTAL KNEE ARTHROPLASTY;  Surgeon: Marchia Bond, MD;  Location: [facility];  Service: Orthopedics;  Laterality: Right;      reports that he has never smoked.  He has never used smokeless tobacco. He reports that he drinks alcohol. He reports that he does not use drugs. Social History   Social History  . Marital status: Divorced    Spouse name: N/A  . Number of children: N/A  . Years of education: 60 th grad   Occupational History  . [employer]    Social History Main Topics  . Smoking status: Never Smoker  . Smokeless tobacco: Never Used  . Alcohol use Yes      Comment: socially  . Drug use: No  . Sexual activity: Yes    Birth control/ protection: None   Other Topics Concern  . Not on file   Social History Narrative  . No narrative on file      Allergies  Allergen Reactions  . Levofloxacin Anaphylaxis    Pertinent  Health Maintenance Due  Topic Date Due  . FOOT EXAM  09/03/1974  . OPHTHALMOLOGY EXAM  09/03/1974  . COLONOSCOPY  09/03/2014  . INFLUENZA VACCINE  07/26/2016  . HEMOGLOBIN A1C  02/19/2017    Medications:   Medication List       Accurate as of 09/13/16  4:01 PM. Always use your most recent med list.          AMBIEN 10 MG tablet Generic drug:  zolpidem Take 10 mg by mouth at bedtime.   baclofen 10 MG tablet Commonly known as:  LIORESAL Take 1 tablet (10 mg total) by mouth 3 (three) times daily. As needed for muscle spasm   diltiazem 300 MG 24 hr capsule Commonly known as:  CARDIZEM CD Take 300 mg by mouth at bedtime.   fluticasone 50 MCG/ACT nasal spray Commonly known as:  FLONASE Place 1 spray into both nostrils daily as needed for allergies or rhinitis.   labetalol 200 MG tablet Commonly known as:  NORMODYNE Take 200 mg by mouth 2 (two) times daily.   losartan 100 MG tablet Commonly known as:  COZAAR Take 100 mg by mouth daily.   metFORMIN 500 MG tablet Commonly known as:  GLUCOPHAGE Take 500 mg by mouth 2 (two) times daily with a meal.   ondansetron 4 MG tablet Commonly known as:  ZOFRAN Take 1 tablet (4 mg total) by mouth every 8 (eight) hours as needed for nausea or vomiting.   oxyCODONE-acetaminophen 5-325 MG tablet Commonly known as:  PERCOCET/ROXICET Take 2 tablets by mouth every 6 (six) hours as needed for moderate pain or severe pain (until percocet 10/325 comes in).   oxyCODONE-acetaminophen 10-325 MG tablet Commonly known as:  PERCOCET Take one tablet by mouth every 6 hours as needed for moderate pain. Do not exceed 3gm of APAP in 24 hours   RABEprazole 20 MG tablet Commonly  known as:  ACIPHEX Take 20 mg by mouth daily.   rivaroxaban 10 MG Tabs tablet Commonly known as:  XARELTO Take 1 tablet (10 mg total) by mouth daily.   sennosides-docusate sodium 8.6-50 MG tablet Commonly known as:  SENOKOT-S Take 2 tablets by mouth daily.       Review of Systems  Constitutional: Negative for activity change, appetite change, chills, fatigue and fever.  HENT: Negative for congestion, rhinorrhea, sinus pressure, sneezing and sore throat.   Eyes: Negative.   Respiratory: Negative for cough, chest tightness, shortness of breath and wheezing.   Cardiovascular: Negative for chest pain, palpitations and leg swelling.  Gastrointestinal: Negative for abdominal distention, abdominal pain, constipation, diarrhea, nausea and vomiting.  Endocrine: Negative.  Genitourinary: Negative for dysuria, flank pain, frequency and urgency.  Musculoskeletal: Positive for gait problem.       Right Knee pain   Skin: Negative for color change, pallor and rash.  Neurological: Negative for dizziness, seizures, light-headedness and headaches.  Hematological: Does not bruise/bleed easily.  Psychiatric/Behavioral: Negative for agitation, confusion, hallucinations and sleep disturbance. The patient is not nervous/anxious.     Vitals:   09/13/16 1551  BP: (!) 141/67  Pulse: 66  Resp: 20  Temp: 97.6 F (36.4 C)  TempSrc: Oral  SpO2: 99%  Weight: (!) 322 lb 9.6 oz (146.3 kg)  Height: 6' (1.829 m)   Body mass index is 43.75 kg/m. Physical Exam  Constitutional: He is oriented to person, place, and time. He appears well-developed and well-nourished. No distress.  HENT:  Head: Normocephalic.  Mouth/Throat: Oropharynx is clear and moist. No oropharyngeal exudate.  Eyes: Conjunctivae and EOM are normal. Pupils are equal, round, and reactive to light. Right eye exhibits no discharge. Left eye exhibits no discharge. No scleral icterus.  Neck: Normal range of motion. No JVD present. No  thyromegaly present.  Cardiovascular: Normal rate, regular rhythm, normal heart sounds and intact distal pulses.  Exam reveals no gallop and no friction rub.   No murmur heard. Pulmonary/Chest: Effort normal and breath sounds normal. No respiratory distress. He has no wheezes. He has no rales.  Abdominal: Soft. Bowel sounds are normal. He exhibits no distension. There is no tenderness. There is no rebound and no guarding.  Genitourinary:  Genitourinary Comments: Continent   Musculoskeletal: He exhibits no edema, tenderness or deformity.  Moves x 4 extremities except right knee limited due to pain.   Lymphadenopathy:    He has no cervical adenopathy.  Neurological: He is oriented to person, place, and time.  Skin: Skin is warm and dry. No rash noted. No erythema. No pallor.  Right knee incision progressive healing drsg dry, clean and intact. Surrounding skin without any signs of infections.   Psychiatric: He has a normal mood and affect.    Labs reviewed: Basic Metabolic Panel:  Recent Labs  08/19/16 1259 08/31/16 0543  NA 137 132*  K 4.2 4.0  CL 104 95*  CO2 26 24  GLUCOSE 147* 171*  BUN 15 13  CREATININE 1.10 0.88  CALCIUM 9.9 8.8*    Recent Labs  08/31/16 0543 09/01/16 0407 09/02/16 0523  WBC 11.5* 16.1* 12.1*  HGB 10.3* 10.8* 11.0*  HCT 33.1* 34.3* 35.7*  MCV 87.1 87.1 87.9  PLT 350 359 369    Recent Labs  09/01/16 1616 09/01/16 2111 09/02/16 0641  GLUCAP 188* 147* 137*    Assessment/Plan:   HTN B/p stable. Continue on Diltiazem and Losartan. BMP in 1-2 weeks with PCP   Insomnia  Continue on Zolpidem 10 mg tablet at bedtime.  Unsteady Gait  S/p short term rehabilitation post right knee total Arthroplasty.will discharged home with Home health PT/OT to continue with ROM, Exercise, Gait stability and muscle strengthening. He will  require  DME: Cane  to allow her to maintain current level of independence with ADL's. Fall and safety precautions.   Type 2  DM CBG's ranging in 120's-190's. Continue on Glucophage 500 mg Tablet twice daily. Hgb A1C with PCP   Constipation  Current regimen effective. Encourage hydration.   S/p Right Total Knee Arthroplasty discharged home with Home health PT/OT to continue with ROM, Exercise, Gait stability and muscle strengthening. He will  require  DME: Cane  to allow  her to maintain current level of independence with ADL's. Continue on [place] for DVT prophylaxis.Wean of oxycodone as tolerated. CBC in 1-2 weeks with PCP   Patient is being discharged with the following home health services:    - PT/OT to continue with ROM, Exercise, Gait stability and muscle strengthening.    Patient is being discharged with the following durable medical equipment:  - Cane  to allow her to maintain current level of independence with ADL's.  Patient has been advised to f/u with their PCP in 1-2 weeks to for a transitions of care visit.  Social services at their facility was responsible for arranging this appointment.  Pt was provided with adequate prescriptions of noncontrolled medications to reach the scheduled appointment .  For controlled substances, a limited supply was provided as appropriate for the individual patient.  If the pt normally receives these medications from a pain clinic or has a contract with another physician, these medications should be received from that clinic or physician only).    Future labs/tests needed:  CBC, BMP in 1-2 weeks with PCP

## 2016-09-15 DIAGNOSIS — Z96651 Presence of right artificial knee joint: Secondary | ICD-10-CM | POA: Diagnosis not present

## 2016-09-15 DIAGNOSIS — E119 Type 2 diabetes mellitus without complications: Secondary | ICD-10-CM | POA: Diagnosis not present

## 2016-09-15 DIAGNOSIS — I10 Essential (primary) hypertension: Secondary | ICD-10-CM | POA: Diagnosis not present

## 2016-09-15 DIAGNOSIS — Z471 Aftercare following joint replacement surgery: Secondary | ICD-10-CM | POA: Diagnosis not present

## 2016-10-03 ENCOUNTER — Ambulatory Visit (HOSPITAL_COMMUNITY): Payer: 59 | Attending: Orthopedic Surgery | Admitting: Physical Therapy

## 2016-10-03 DIAGNOSIS — R600 Localized edema: Secondary | ICD-10-CM | POA: Insufficient documentation

## 2016-10-03 DIAGNOSIS — M25661 Stiffness of right knee, not elsewhere classified: Secondary | ICD-10-CM

## 2016-10-03 DIAGNOSIS — R262 Difficulty in walking, not elsewhere classified: Secondary | ICD-10-CM

## 2016-10-03 DIAGNOSIS — M6281 Muscle weakness (generalized): Secondary | ICD-10-CM | POA: Diagnosis present

## 2016-10-03 NOTE — Therapy (Signed)
[facility] [place], [place], [address] Phone: [phone]   Fax:  [phone]  Physical Therapy Evaluation  Patient Details  Name: Nicholas Delacruz MRN: [id number] Date of Birth: 02-May-1966 Referring Provider: Marchia Bond   Encounter Date: 10/03/2016      PT End of Session - 10/03/16 1622    Visit Number 1   Number of Visits 15   Date for PT Re-Evaluation 10/24/16   Authorization Type [employer]    PT Start Time 1527   PT Stop Time 1558   PT Time Calculation (min) 31 min   Activity Tolerance Patient tolerated treatment well   Behavior During Therapy [facility] for tasks assessed/performed      Past Medical History:  Diagnosis Date  . Diabetes mellitus without complication ([place])   . GERD (gastroesophageal reflux disease)   . High blood pressure   . Obstructive sleep apnea on CPAP   . PONV (postoperative nausea and vomiting)    blood pressure dropped in recovery    Past Surgical History:  Procedure Laterality Date  . APPENDECTOMY    . KNEE ARTHROSCOPY Right 01/02/2015   Procedure: RIGHT KNEE ARTHROSCOPY, LATERAL AND MEDIAL MENISECTOMY;  Surgeon: Carole Civil, MD;  Location[facility];  Service: Orthopedics;  Laterality: Right;  . KNEE ARTHROSCOPY WITH MEDIAL MENISECTOMY Left 06/25/2014   Procedure: KNEE ARTHROSCOPY WITH MEDIAL MENISECTOMY;  Surgeon: Carole Civil, MD;  Location: [facility];  Service: Orthopedics;  Laterality: Left;  . KNEE SURGERY Left   . MENISCUS DEBRIDEMENT Right 01/02/2015   Procedure: DEBRIDEMENT OF RIGHT KNEE JOINT;  Surgeon: Carole Civil, MD;  Location: [facility];  Service: Orthopedics;  Laterality: Right;  . right knee sark    . SHOULDER SURGERY     left  . TOTAL KNEE ARTHROPLASTY Right 08/30/2016   Procedure: TOTAL KNEE ARTHROPLASTY;  Surgeon: Marchia Bond, MD;  Location: [facility];  Service: Orthopedics;  Laterality: Right;    There were no vitals filed for this visit.        Subjective Assessment - 10/03/16 1531    Subjective Patient had R knee replacement done about 4 weeks ago, then went to [facility] for rehab; he then got some HHPT and has been DCed from thier care. He states that it is quite difficult to bend his knee, he can do stairs one at a time, he is OK walking outside if he takes his time with his cane. No falls or close calls recently.  He states he can get around fairly well with his cane.    Pertinent History no longer taking xarelto, HTN, L LE weakness, DM, OSA, GERD, extensive B knee surgical history    How long can you sit comfortably? 30 minutes    How long can you stand comfortably? 10-15 minutes    How long can you walk comfortably? 10 minutes   Patient Stated Goals return to PLOF    Currently in Pain? Yes   Pain Score 5    Pain Location Knee   Pain Orientation Right   Pain Descriptors / Indicators Aching   Pain Type Surgical pain   Pain Radiating Towards none    Pain Onset 1 to 4 weeks ago   Pain Frequency Constant   Aggravating Factors  bending it   Pain Relieving Factors ice    Effect of Pain on Daily Activities cannot perform PLOF  [facility] PT Assessment - 10/03/16 0001      Assessment   Medical Diagnosis R knee replacement    Referring Provider Marchia Bond    Onset Date/Surgical Date --  about 4 weeks ago    Next MD Visit Dr. Mardelle Matte on Friday      Precautions   Precaution Comments none known     Balance Screen   Has the patient fallen in the past 6 months No   Has the patient had a decrease in activity level because of a fear of falling?  No   Is the patient reluctant to leave their home because of a fear of falling?  No     Prior Function   Level of Independence Independent;Independent with basic ADLs;Independent with gait;Independent with transfers   Vocation On disability   Vocation Requirements 12 hour shifts, on his feet at [employer]      AROM   Right Knee Extension 11   Right Knee  Flexion 60     Strength   Right Hip Flexion 5/5   Right Hip Extension 4+/5   Right Hip ABduction 4+/5   Left Hip Flexion 5/5   Left Hip Extension 4+/5   Left Hip ABduction 4+/5   Right Knee Flexion 3/5   Right Knee Extension 4+/5   Left Knee Flexion 4+/5   Left Knee Extension 4+/5   Right Ankle Dorsiflexion 5/5   Left Ankle Dorsiflexion 5/5     6 minute walk test results    Aerobic Endurance Distance Walked 348   Endurance additional comments 3MWT      High Level Balance   High Level Balance Comments L LE SLS 20 seconds, R LE SLS 5 seconds intermittent UE touch                            PT Education - 10/03/16 1621    Education provided Yes   Education Details prognosis, POC, HEP; correct mechanics/positioning of cane during gait   Person(s) Educated Patient   Methods Explanation;Demonstration;Handout   Comprehension Verbalized understanding;Returned demonstration;Need further instruction          PT Short Term Goals - 10/03/16 1629      PT SHORT TERM GOAL #1   Title Patient to demonstrate R knee ROM 0-100 degrees in order to improve mechanics and general gait pattern    Time 3   Period Weeks   Status New     PT SHORT TERM GOAL #2   Title Patient to be able to ambulate unlimited distances with no assistive device, R knee pain no more than 2/10, in order to improve general mobility    Time 3   Period Weeks   Status New     PT SHORT TERM GOAL #3   Title Patient to be able to ambulate at least 590ft during 3MWT in order to show improved mobility and facilitate return to PLOF    Time 3   Period Weeks   Status New     PT SHORT TERM GOAL #4   Title Patient to be independent in correctly and consistently performing appropraite HEP, to be updated PRN    Time 1   Period Weeks   Status New           PT Long Term Goals - 10/03/16 1635      PT LONG TERM GOAL #1   Title Patient to demonstrate ROM R knee 0-115  degrees in order to improve  general gait mechanics and improve general mobility    Time 6   Period Weeks   Status New     PT LONG TERM GOAL #2   Title Patient to be able to maintain SLS each LE for at least 30 seconds B in order to show improved balance skills and reduced fall risk    Time 6   Period Weeks   Status New     PT LONG TERM GOAL #3   Title Patient to be able to ambulate without difficulty over grass and gravel with no assistive device, minimal fall risk, in order to assist in return to PLOF    Time 6   Period Weeks   Status New     PT LONG TERM GOAL #4   Title Patient to be able to reciprocally ascend/descend full flight of stairs, U railing, reciprocal pattern, minimal unsteadiness, good eccentric control, in order to improve mobilty at home and within community    Time 6   Period Weeks   Status New               Plan - 10/03/16 1626    Clinical Impression Statement Patient arrives approximately 4 weeks after R total knee replacement surgery; he reports that he feels he is doing quite well and really just wants to get back to his PLOF. Upon examination, patient reveals significant R knee ROM limitations with localized edema, functional gait impairment, functional muscle weakness, and impaired balance strategies. His incision appears well healing with no signs of infection or inflammation today. Patient appears slightly defiant of PT advice during skilled evaluation rarely making eye contact with DPT during eval, and only correcting position of cane to contralateral side for approximately 30ft during 3MWT before changing cane back to incorrect side. At this point recommend skilled PT services to address functional limitations and attempt to reach optimal level of function.    Rehab Potential Good   Clinical Impairments Affecting Rehab Potential history of multiple knee injuries/surgeries; history of knee rehabilitation; history of non-compliance   PT Frequency Other (comment)  3x/week for 3  weeks, 2x/week for 3 weeks    PT Duration 6 weeks   PT Treatment/Interventions ADLs/Self Care Home Management;Biofeedback;Cryotherapy;Moist Heat;DME Instruction;Gait training;Stair training;Functional mobility training;Therapeutic activities;Therapeutic exercise;Balance training;Neuromuscular re-education;Patient/family education;Manual techniques;Scar mobilization;Passive range of motion;Energy conservation;Taping   PT Next Visit Plan review HEP and goals; focus on ROM of knee, continue correcting position of cane and try SPC instead of quad cane. Edema control.    PT Home Exercise Plan 10/9: quad sets and knee flexion stretch    Recommended Other Services possible compression stockings and/or JAS brace    Consulted and Agree with Plan of Care Patient      Patient will benefit from skilled therapeutic intervention in order to improve the following deficits and impairments:  Abnormal gait, Decreased skin integrity, Increased fascial restricitons, Improper body mechanics, Pain, Decreased mobility, Decreased coordination, Postural dysfunction, Decreased range of motion, Decreased strength, Hypomobility, Decreased balance, Difficulty walking, Increased edema, Impaired flexibility  Visit Diagnosis: Stiffness of right knee, not elsewhere classified - Plan: PT plan of care cert/re-cert  Localized edema - Plan: PT plan of care cert/re-cert  Difficulty in walking, not elsewhere classified - Plan: PT plan of care cert/re-cert  Muscle weakness (generalized) - Plan: PT plan of care cert/re-cert     Problem List Patient Active Problem List   Diagnosis Date Noted  . S/P total knee arthroplasty 08/30/2016  .  Weakness of left leg 03/13/2014  . Difficulty in walking(719.7) 03/13/2014  . Arthritis of knee, left 01/21/2014  . Cervicalgia 11/08/2011  . RHEUMATOID ARTHRITIS 02/01/2011  . RUPTURE ROTATOR CUFF 02/01/2011  . IMPINGEMENT SYNDROME 12/07/2010  . Primary localized osteoarthritis of right  knee 09/28/2009  . Essential hypertension 05/13/2009    Deniece Ree PT, DPT [facility] [address] [place] [address], [place], [address] Phone: [phone]   Fax:  [phone]  Name: Nicholas Delacruz MRN: [id number] Date of Birth: December 20, 1966

## 2016-10-03 NOTE — Patient Instructions (Signed)
   QUAD SET WITH TOWEL UNDER HEEL  While lying or sitting with a small towel roll under your ankle, tighten your top thigh muscle to press the back of your knee downward towards the ground. Hold for 5 seconds and repeat at least 15 times, at least 3 times per day.     KNEE FLEXION STRETCH - SELF ASSISTED  While seated in a chair, use your unaffected leg to bend your affected knee until a stretch is felt.  Hold for 10 seconds, then relax; repeat 15 times at least 3 times per day.

## 2016-10-05 ENCOUNTER — Ambulatory Visit (HOSPITAL_COMMUNITY): Payer: 59 | Admitting: Physical Therapy

## 2016-10-05 ENCOUNTER — Telehealth (HOSPITAL_COMMUNITY): Payer: Self-pay

## 2016-10-05 DIAGNOSIS — R600 Localized edema: Secondary | ICD-10-CM

## 2016-10-05 DIAGNOSIS — M6281 Muscle weakness (generalized): Secondary | ICD-10-CM

## 2016-10-05 DIAGNOSIS — M25661 Stiffness of right knee, not elsewhere classified: Secondary | ICD-10-CM

## 2016-10-05 DIAGNOSIS — R262 Difficulty in walking, not elsewhere classified: Secondary | ICD-10-CM

## 2016-10-05 NOTE — Telephone Encounter (Signed)
He can not come to this apptment due to another MD visit

## 2016-10-05 NOTE — Therapy (Signed)
[place], [place], [address] Phone: [phone]   Fax:  [phone]  Physical Therapy Treatment  Patient Details  Name: Nicholas Delacruz MRN: [id number] Date of Birth: 1966-04-28 Referring Provider: Marchia Bond   Encounter Date: 10/05/2016      PT End of Session - 10/05/16 1629    Visit Number 2   Number of Visits 15   Date for PT Re-Evaluation 10/24/16   Authorization Type [employer]    PT Start Time 1518   PT Stop Time 1556   PT Time Calculation (min) 38 min   Activity Tolerance Patient tolerated treatment well   Behavior During Therapy [facility] for tasks assessed/performed      Past Medical History:  Diagnosis Date  . Diabetes mellitus without complication ([place])   . GERD (gastroesophageal reflux disease)   . High blood pressure   . Obstructive sleep apnea on CPAP   . PONV (postoperative nausea and vomiting)    blood pressure dropped in recovery    Past Surgical History:  Procedure Laterality Date  . APPENDECTOMY    . KNEE ARTHROSCOPY Right 01/02/2015   Procedure: RIGHT KNEE ARTHROSCOPY, LATERAL AND MEDIAL MENISECTOMY;  Surgeon: Carole Civil, MD;  [facility]: [facility];  Service: Orthopedics;  Laterality: Right;  . KNEE ARTHROSCOPY WITH MEDIAL MENISECTOMY Left 06/25/2014   Procedure: KNEE ARTHROSCOPY WITH MEDIAL MENISECTOMY;  Surgeon: Carole Civil, MD;  Location: [facility];  Service: Orthopedics;  Laterality: Left;  . KNEE SURGERY Left   . MENISCUS DEBRIDEMENT Right 01/02/2015   Procedure: DEBRIDEMENT OF RIGHT KNEE JOINT;  Surgeon: Carole Civil, MD;  Location: [facility];  Service: Orthopedics;  Laterality: Right;  . right knee sark    . SHOULDER SURGERY     left  . TOTAL KNEE ARTHROPLASTY Right 08/30/2016   Procedure: TOTAL KNEE ARTHROPLASTY;  Surgeon: Marchia Bond, MD;  Location: [place];  Service: Orthopedics;  Laterality: Right;    There were no vitals filed for this visit.       Subjective Assessment - 10/05/16 1520    Subjective Patient arrives today stating he is doing OK, his pain is around 5/10 and no major changes since last time    Pertinent History no longer taking xarelto, HTN, L LE weakness, DM, OSA, GERD, extensive B knee surgical history    Currently in Pain? Yes   Pain Score 5    Pain Location Knee   Pain Orientation Right            [facility] PT Assessment - 10/05/16 0001      Observation/Other Assessments   Observations noted small area of apparent pus that emerged from small open area with pressure applied to surrounding area   mid-distal incision    Skin Integrity small scabbed and open areas continue to be noted especially distal incision                      [facility] Adult PT Treatment/Exercise - 10/05/16 0001      Knee/Hip Exercises: Stretches   Active Hamstring Stretch Right;3 reps;30 seconds   Active Hamstring Stretch Limitations 12 inch box    Knee: Self-Stretch to increase Flexion Right   Knee: Self-Stretch Limitations 10 reps, 10 seconds    Gastroc Stretch Both;3 reps;30 seconds   Gastroc Stretch Limitations slantboard     Knee/Hip Exercises: Seated   Hamstring Curl 1 set;10 reps;Right   Hamstring Limitations AAROM stretch with  5 second holds      Knee/Hip Exercises: Supine   Quad Sets 2 sets;10 reps   Quad Sets Limitations first set with overpressure, second set without    Bridges Both;1 set;15 reps   Other Supine Knee/Hip Exercises supine hip ABD with red TB 1x10   Other Supine Knee/Hip Exercises rockerboard with U-B UEs x2 minutes AP and lateral      Knee/Hip Exercises: Prone   Hamstring Curl 1 set;10 reps   Hamstring Curl Limitations overpressure from PT, for AAROM      Manual Therapy   Manual Therapy Soft tissue mobilization   Manual therapy comments performed separately from all other skilled treatments    Soft tissue mobilization scar massage, patella mobilizations all directions; investigation and  appropriate dressing of area seeming to produce pus                 PT Education - 10/05/16 1629    Education provided Yes   Education Details keep dressing area containing pus, speak to MD about this at appt Friday   Person(s) Educated Patient   Methods Explanation   Comprehension Verbalized understanding          PT Short Term Goals - 10/03/16 1629      PT SHORT TERM GOAL #1   Title Patient to demonstrate R knee ROM 0-100 degrees in order to improve mechanics and general gait pattern    Time 3   Period Weeks   Status New     PT SHORT TERM GOAL #2   Title Patient to be able to ambulate unlimited distances with no assistive device, R knee pain no more than 2/10, in order to improve general mobility    Time 3   Period Weeks   Status New     PT SHORT TERM GOAL #3   Title Patient to be able to ambulate at least 545ft during 3MWT in order to show improved mobility and facilitate return to PLOF    Time 3   Period Weeks   Status New     PT SHORT TERM GOAL #4   Title Patient to be independent in correctly and consistently performing appropraite HEP, to be updated PRN    Time 1   Period Weeks   Status New           PT Long Term Goals - 10/03/16 1635      PT LONG TERM GOAL #1   Title Patient to demonstrate ROM R knee 0-115 degrees in order to improve general gait mechanics and improve general mobility    Time 6   Period Weeks   Status New     PT LONG TERM GOAL #2   Title Patient to be able to maintain SLS each LE for at least 30 seconds B in order to show improved balance skills and reduced fall risk    Time 6   Period Weeks   Status New     PT LONG TERM GOAL #3   Title Patient to be able to ambulate without difficulty over grass and gravel with no assistive device, minimal fall risk, in order to assist in return to PLOF    Time 6   Period Weeks   Status New     PT LONG TERM GOAL #4   Title Patient to be able to reciprocally ascend/descend full flight  of stairs, U railing, reciprocal pattern, minimal unsteadiness, good eccentric control, in order to improve mobilty at home and within  community    Time 6   Period Weeks   Status New               Plan - 10/05/16 1629    Clinical Impression Statement Began session with functional stretching and then proceeded to work on patella mobilization and scar mobility (avoiding areas still scabbed/healing); however did note some pus coming from one small area when pressure was applied distal-middle incision. Inspected area and dressed with clean gauze/tape, advised patient to keep this covered and speak to MD about it at his appointment this Friday. Otherwise continued with ROM and pre-gait based exercises and activities, with good performance and tolerance from patient noted throughout session.     Rehab Potential Good   Clinical Impairments Affecting Rehab Potential history of multiple knee injuries/surgeries; history of knee rehabilitation   PT Frequency Other (comment)  3x/week for 3 weeks, then 2x/week for 3 weeks    PT Duration 6 weeks   PT Treatment/Interventions ADLs/Self Care Home Management;Biofeedback;Cryotherapy;Moist Heat;DME Instruction;Gait training;Stair training;Functional mobility training;Therapeutic activities;Therapeutic exercise;Balance training;Neuromuscular re-education;Patient/family education;Manual techniques;Scar mobilization;Passive range of motion;Energy conservation;Taping   PT Next Visit Plan review HEP and goals; focus on ROM of knee, continue correcting position of cane and try SPC instead of quad cane. Edema control. Monitor open areas of incision for ongoing pus/dress appropriately with clean gauze.    PT Home Exercise Plan 10/9: quad sets and knee flexion stretch    Consulted and Agree with Plan of Care Patient      Patient will benefit from skilled therapeutic intervention in order to improve the following deficits and impairments:  Abnormal gait, Decreased  skin integrity, Increased fascial restricitons, Improper body mechanics, Pain, Decreased mobility, Decreased coordination, Postural dysfunction, Decreased range of motion, Decreased strength, Hypomobility, Decreased balance, Difficulty walking, Increased edema, Impaired flexibility  Visit Diagnosis: Stiffness of right knee, not elsewhere classified  Localized edema  Difficulty in walking, not elsewhere classified  Muscle weakness (generalized)     Problem List Patient Active Problem List   Diagnosis Date Noted  . S/P total knee arthroplasty 08/30/2016  . Weakness of left leg 03/13/2014  . Difficulty in walking(719.7) 03/13/2014  . Arthritis of knee, left 01/21/2014  . Cervicalgia 11/08/2011  . RHEUMATOID ARTHRITIS 02/01/2011  . RUPTURE ROTATOR CUFF 02/01/2011  . IMPINGEMENT SYNDROME 12/07/2010  . Primary localized osteoarthritis of right knee 09/28/2009  . Essential hypertension 05/13/2009    Deniece Ree PT, DPT [facility] [address] [place], [place], [address] Phone: [phone]   Fax:  [phone]  Name: Nicholas Delacruz MRN: [id number] Date of Birth: 29-Nov-1966

## 2016-10-07 ENCOUNTER — Ambulatory Visit (HOSPITAL_COMMUNITY): Payer: 59

## 2016-10-07 DIAGNOSIS — M6281 Muscle weakness (generalized): Secondary | ICD-10-CM

## 2016-10-07 DIAGNOSIS — M25661 Stiffness of right knee, not elsewhere classified: Secondary | ICD-10-CM

## 2016-10-07 DIAGNOSIS — R262 Difficulty in walking, not elsewhere classified: Secondary | ICD-10-CM

## 2016-10-07 DIAGNOSIS — R600 Localized edema: Secondary | ICD-10-CM

## 2016-10-07 NOTE — Therapy (Signed)
[place], [place], [address] Phone: [phone]   Fax:  [phone]  Physical Therapy Treatment  Patient Details  Name: Nicholas Delacruz MRN: [id number] Date of Birth: [birth year] Referring Provider: Marchia Bond   Encounter Date: 10/07/2016      PT End of Session - 10/07/16 1443    Visit Number 3   Number of Visits 15   Date for PT Re-Evaluation 10/24/16   Authorization Type [employer]    PT Start Time [id number]   PT Stop Time 1518   PT Time Calculation (min) 41 min   Activity Tolerance Patient tolerated treatment well   Behavior During Therapy [facility] for tasks assessed/performed      Past Medical History:  Diagnosis Date  . Diabetes mellitus without complication ([place])   . GERD (gastroesophageal reflux disease)   . High blood pressure   . Obstructive sleep apnea on CPAP   . PONV (postoperative nausea and vomiting)    blood pressure dropped in recovery    Past Surgical History:  Procedure Laterality Date  . APPENDECTOMY    . KNEE ARTHROSCOPY Right 01/02/2015   Procedure: RIGHT KNEE ARTHROSCOPY, LATERAL AND MEDIAL MENISECTOMY;  Surgeon: Carole Civil, MD;  Location: [facility];  Service: Orthopedics;  Laterality: Right;  . KNEE ARTHROSCOPY WITH MEDIAL MENISECTOMY Left 06/25/2014   Procedure: KNEE ARTHROSCOPY WITH MEDIAL MENISECTOMY;  Surgeon: Carole Civil, MD;  Location[facility];  Service: Orthopedics;  Laterality: Left;  . KNEE SURGERY Left   . MENISCUS DEBRIDEMENT Right 01/02/2015   Procedure: DEBRIDEMENT OF RIGHT KNEE JOINT;  Surgeon: Carole Civil, MD;  Location: [facility];  Service: Orthopedics;  Laterality: Right;  . right knee sark    . SHOULDER SURGERY     left  . TOTAL KNEE ARTHROPLASTY Right 08/30/2016   Procedure: TOTAL KNEE ARTHROPLASTY;  Surgeon: Marchia Bond, MD;  Location: [place];  Service: Orthopedics;  Laterality: Right;    There were no vitals filed for this visit.       Subjective Assessment - 10/07/16 1437    Subjective Pt stated he went to MD earlier today, happy with progress knee is making.  MD did look at the incision, no signs of infection and no further pussing noted.  Pt stated he got a shot in Lt knee as well as MD for pain control that is a little sore today.     Pertinent History no longer taking xarelto, HTN, L LE weakness, DM, OSA, GERD, extensive B knee surgical history    Patient Stated Goals return to PLOF    Currently in Pain? Yes   Pain Score 6    Pain Location Knee   Pain Orientation Right   Pain Descriptors / Indicators Aching;Discomfort;Tightness  Discomfort with gait; stiffness   Pain Type Surgical pain   Pain Radiating Towards none   Pain Onset 1 to 4 weeks ago   Pain Frequency Constant   Aggravating Factors  bending it   Pain Relieving Factors ice   Effect of Pain on Daily Activities cannot perform PLOF             [facility] Adult PT Treatment/Exercise - 10/07/16 0001      Knee/Hip Exercises: Stretches   Active Hamstring Stretch Right;3 reps;30 seconds   Active Hamstring Stretch Limitations supine with rope   Quad Stretch 3 reps;30 seconds   Quad Stretch Limitations prone with rope   Knee: Self-Stretch to increase Flexion Right  Knee: Self-Stretch Limitations 10 reps, 10 seconds 12in step     Knee/Hip Exercises: Supine   Quad Sets 2 sets;10 reps   Short Arc [employer] 15 reps   Short Arc Quad Sets Limitations 5" holds   Heel Slides 10 reps;AAROM   Heel Slides Limitations AAROM with rope     Manual Therapy   Manual Therapy Edema management;Joint mobilization;Myofascial release   Manual therapy comments performed separately from all other skilled treatments    Edema Management Retro massage wtih LE elevated   Joint Mobilization patella mobs all directions, tib/fib   Myofascial Release scar tissue massage                  PT Short Term Goals - 10/03/16 1629      PT SHORT TERM GOAL #1   Title Patient  to demonstrate R knee ROM 0-100 degrees in order to improve mechanics and general gait pattern    Time 3   Period Weeks   Status New     PT SHORT TERM GOAL #2   Title Patient to be able to ambulate unlimited distances with no assistive device, R knee pain no more than 2/10, in order to improve general mobility    Time 3   Period Weeks   Status New     PT SHORT TERM GOAL #3   Title Patient to be able to ambulate at least 568ft during 3MWT in order to show improved mobility and facilitate return to PLOF    Time 3   Period Weeks   Status New     PT SHORT TERM GOAL #4   Title Patient to be independent in correctly and consistently performing appropraite HEP, to be updated PRN    Time 1   Period Weeks   Status New           PT Long Term Goals - 10/03/16 1635      PT LONG TERM GOAL #1   Title Patient to demonstrate ROM R knee 0-115 degrees in order to improve general gait mechanics and improve general mobility    Time 6   Period Weeks   Status New     PT LONG TERM GOAL #2   Title Patient to be able to maintain SLS each LE for at least 30 seconds B in order to show improved balance skills and reduced fall risk    Time 6   Period Weeks   Status New     PT LONG TERM GOAL #3   Title Patient to be able to ambulate without difficulty over grass and gravel with no assistive device, minimal fall risk, in order to assist in return to PLOF    Time 6   Period Weeks   Status New     PT LONG TERM GOAL #4   Title Patient to be able to reciprocally ascend/descend full flight of stairs, U railing, reciprocal pattern, minimal unsteadiness, good eccentric control, in order to improve mobilty at home and within community    Time 6   Period Weeks   Status New               Plan - 10/07/16 1450    Clinical Impression Statement Reviewed goals, compliance with HEP and copy of eval given.  Session focus on improving knee mobility with therex quad strengthening and functional  stretches as well as manual technqiues.  Retro massage complete for edema control with LE elevated, patella and tib/fib mobs complete  to improve mobiltiy and scar tissue massage complete to reduce adhesions.  No noted pus with manual today, did avoid contacting scabs to prevent infection.  Improved AROM following manual 10-73 degrees with AAROM of rope.  Pt was limited by pain through session with increased pain scale of 7/10 (was 6/10 initially).  Encouraged pt to ambulate wtih cane in Lt UE to reduce pressure on Rt LE as well as application of ice for pain control following therex.     Rehab Potential Good   Clinical Impairments Affecting Rehab Potential history of multiple knee injuries/surgeries; history of knee rehabilitation   PT Frequency --  3x/3 wks then 2x/ 3 wks   PT Duration 6 weeks   PT Treatment/Interventions ADLs/Self Care Home Management;Biofeedback;Cryotherapy;Moist Heat;DME Instruction;Gait training;Stair training;Functional mobility training;Therapeutic activities;Therapeutic exercise;Balance training;Neuromuscular re-education;Patient/family education;Manual techniques;Scar mobilization;Passive range of motion;Energy conservation;Taping   PT Next Visit Plan focus on ROM of knee, continue correcting position of cane and try SPC instead of quad cane. Edema control. Monitor open areas of incision for ongoing pus/dress appropriately with clean gauze.       Patient will benefit from skilled therapeutic intervention in order to improve the following deficits and impairments:  Abnormal gait, Decreased skin integrity, Increased fascial restricitons, Improper body mechanics, Pain, Decreased mobility, Decreased coordination, Postural dysfunction, Decreased range of motion, Decreased strength, Hypomobility, Decreased balance, Difficulty walking, Increased edema, Impaired flexibility  Visit Diagnosis: Stiffness of right knee, not elsewhere classified  Localized edema  Difficulty in walking,  not elsewhere classified  Muscle weakness (generalized)     Problem List Patient Active Problem List   Diagnosis Date Noted  . S/P total knee arthroplasty 08/30/2016  . Weakness of left leg 03/13/2014  . Difficulty in walking(719.7) 03/13/2014  . Arthritis of knee, left 01/21/2014  . Cervicalgia 11/08/2011  . RHEUMATOID ARTHRITIS 02/01/2011  . RUPTURE ROTATOR CUFF 02/01/2011  . IMPINGEMENT SYNDROME 12/07/2010  . Primary localized osteoarthritis of right knee 09/28/2009  . Essential hypertension 05/13/2009   Ihor Austin, [place]; [place]  Aldona Lento 10/07/2016, 3:53 PM  [facility] [address] [place], [place], [address] Phone: [phone]   Fax:  [phone]  Name: Nicholas Delacruz MRN: [id number] Date of Birth: 1966/11/16

## 2016-10-10 ENCOUNTER — Ambulatory Visit (HOSPITAL_COMMUNITY): Payer: 59 | Admitting: Physical Therapy

## 2016-10-10 DIAGNOSIS — R600 Localized edema: Secondary | ICD-10-CM

## 2016-10-10 DIAGNOSIS — R262 Difficulty in walking, not elsewhere classified: Secondary | ICD-10-CM

## 2016-10-10 DIAGNOSIS — M6281 Muscle weakness (generalized): Secondary | ICD-10-CM

## 2016-10-10 DIAGNOSIS — M25661 Stiffness of right knee, not elsewhere classified: Secondary | ICD-10-CM | POA: Diagnosis not present

## 2016-10-10 NOTE — Therapy (Signed)
[place], [place], [address] Phone: [phone]   Fax:  [phone]  Physical Therapy Treatment  Patient Details  Name: Nicholas Delacruz MRN: [id number] Date of Birth: April 06, 1966 Referring Provider: Marchia Bond   Encounter Date: 10/10/2016      PT End of Session - 10/10/16 1440    Visit Number 4   Number of Visits 15   Date for PT Re-Evaluation 10/24/16   Authorization Type [place]    PT Start Time 1354  patient arrived a few minutes late    PT Stop Time 1428   PT Time Calculation (min) 34 min   Activity Tolerance Patient tolerated treatment well   Behavior During Therapy [facility] for tasks assessed/performed      Past Medical History:  Diagnosis Date  . Diabetes mellitus without complication ([place])   . GERD (gastroesophageal reflux disease)   . High blood pressure   . Obstructive sleep apnea on CPAP   . PONV (postoperative nausea and vomiting)    blood pressure dropped in recovery    Past Surgical History:  Procedure Laterality Date  . APPENDECTOMY    . KNEE ARTHROSCOPY Right 01/02/2015   Procedure: RIGHT KNEE ARTHROSCOPY, LATERAL AND MEDIAL MENISECTOMY;  Surgeon: Carole Civil, MD;  Location: [facility];  Service: Orthopedics;  Laterality: Right;  . KNEE ARTHROSCOPY WITH MEDIAL MENISECTOMY Left 06/25/2014   Procedure: KNEE ARTHROSCOPY WITH MEDIAL MENISECTOMY;  Surgeon: Carole Civil, MD;  Location: [facility];  Service: Orthopedics;  Laterality: Left;  . KNEE SURGERY Left   . MENISCUS DEBRIDEMENT Right 01/02/2015   Procedure: DEBRIDEMENT OF RIGHT KNEE JOINT;  Surgeon: Carole Civil, MD;  Location: [facility];  Service: Orthopedics;  Laterality: Right;  . right knee sark    . SHOULDER SURGERY     left  . TOTAL KNEE ARTHROPLASTY Right 08/30/2016   Procedure: TOTAL KNEE ARTHROPLASTY;  Surgeon: Marchia Bond, MD;  Location: [facility];  Service: Orthopedics;  Laterality: Right;    There were no vitals  filed for this visit.      Subjective Assessment - 10/10/16 1356    Subjective Patient arrives today stating he is doing well, his pain is staying about the same. No major changes since last session. Patient states that his knee continues to get stiff when he is sitting still, he is slightly frustrated as his MD told him it would only be 3 months before he was back to normal but last time he went the MD told him maybe 9 months.    Pertinent History no longer taking xarelto, HTN, L LE weakness, DM, OSA, GERD, extensive B knee surgical history    Currently in Pain? Yes   Pain Score 5    Pain Location Knee   Pain Orientation Right            [facility] PT Assessment - 10/10/16 0001      Observation/Other Assessments   Skin Integrity incision appears to be making progress with healing process; no pus noted today however there are still a couple scabbed areas along the course of the incision      AROM   Right Knee Extension 8   Right Knee Flexion 65                     [facility] Adult PT Treatment/Exercise - 10/10/16 0001      Knee/Hip Exercises: Stretches   Active Hamstring Stretch Right;3 reps;30 seconds  Active Hamstring Stretch Limitations 12 inch box    Knee: Self-Stretch to increase Flexion Right   Knee: Self-Stretch Limitations 10 reps, 10 seconds 12in step   Gastroc Stretch Both;3 reps;30 seconds   Gastroc Stretch Limitations slantboard     Knee/Hip Exercises: Aerobic   Stationary Bike approx 5 minutes with overpress on pedal from DPT    Nustep approx 7 minutes unsupervised, no resistance      Knee/Hip Exercises: Standing   Terminal Knee Extension Limitations 1x20 with 3 second holds      Knee/Hip Exercises: Supine   Quad Sets Right;1 set;20 reps     Manual Therapy   Manual Therapy Soft tissue mobilization   Manual therapy comments performed separately from all other skilled treatments    Soft tissue mobilization scar massage, patella mobilization all  directions, deep tissue work to distal quad                 PT Education - 10/10/16 1439    Education provided Yes   Education Details encouraged ongoing ice, importance of bike in promoting ROM, hold off on putting lotion/oils on incision until scabs fall off as a precaution    Person(s) Educated Patient   Methods Explanation   Comprehension Verbalized understanding          PT Short Term Goals - 10/03/16 1629      PT SHORT TERM GOAL #1   Title Patient to demonstrate R knee ROM 0-100 degrees in order to improve mechanics and general gait pattern    Time 3   Period Weeks   Status New     PT SHORT TERM GOAL #2   Title Patient to be able to ambulate unlimited distances with no assistive device, R knee pain no more than 2/10, in order to improve general mobility    Time 3   Period Weeks   Status New     PT SHORT TERM GOAL #3   Title Patient to be able to ambulate at least 542ft during 3MWT in order to show improved mobility and facilitate return to PLOF    Time 3   Period Weeks   Status New     PT SHORT TERM GOAL #4   Title Patient to be independent in correctly and consistently performing appropraite HEP, to be updated PRN    Time 1   Period Weeks   Status New           PT Long Term Goals - 10/03/16 1635      PT LONG TERM GOAL #1   Title Patient to demonstrate ROM R knee 0-115 degrees in order to improve general gait mechanics and improve general mobility    Time 6   Period Weeks   Status New     PT LONG TERM GOAL #2   Title Patient to be able to maintain SLS each LE for at least 30 seconds B in order to show improved balance skills and reduced fall risk    Time 6   Period Weeks   Status New     PT LONG TERM GOAL #3   Title Patient to be able to ambulate without difficulty over grass and gravel with no assistive device, minimal fall risk, in order to assist in return to PLOF    Time 6   Period Weeks   Status New     PT LONG TERM GOAL #4   Title  Patient to be able to reciprocally ascend/descend full flight  of stairs, U railing, reciprocal pattern, minimal unsteadiness, good eccentric control, in order to improve mobilty at home and within community    Time 6   Period Weeks   Status New               Plan - 10/10/16 1441    Clinical Impression Statement Continued work on functional stretching and activities based on increasing ROM surgical LE; incision appears to be almost healed however some scabbing remains. ROM measured at 8-65 degrees today, indicating some slow progress with ROM however patient does remain quite limited in this regard. Encouraged patient to keep up with icing to control edema. Educated patient to hold off on applying lotions/oils to knee until scabs are gone as a precaution/to ensure no open areas of tissue are affected. Introduced reclining bike today with focus on partial rotations but able to achieve one full rotation; performed Nustep unsupervised at end of session.    Rehab Potential Good   Clinical Impairments Affecting Rehab Potential history of multiple knee injuries/surgeries; history of knee rehabilitation   PT Frequency Other (comment)  3x/week for 3 weeks, then 2x/week for 3 weeks    PT Duration 6 weeks   PT Treatment/Interventions ADLs/Self Care Home Management;Biofeedback;Cryotherapy;Moist Heat;DME Instruction;Gait training;Stair training;Functional mobility training;Therapeutic activities;Therapeutic exercise;Balance training;Neuromuscular re-education;Patient/family education;Manual techniques;Scar mobilization;Passive range of motion;Energy conservation;Taping   PT Next Visit Plan Start on Nustep to assist in limbering knee for ROM. Focus on ROM of knee, continue correcting position of cane and try SPC instead of quad cane. Edema control. Monitor open areas of incision for ongoing pus/dress appropriately with clean gauze.    PT Home Exercise Plan 10/9: quad sets and knee flexion stretch     Consulted and Agree with Plan of Care Patient      Patient will benefit from skilled therapeutic intervention in order to improve the following deficits and impairments:  Abnormal gait, Decreased skin integrity, Increased fascial restricitons, Improper body mechanics, Pain, Decreased mobility, Decreased coordination, Postural dysfunction, Decreased range of motion, Decreased strength, Hypomobility, Decreased balance, Difficulty walking, Increased edema, Impaired flexibility  Visit Diagnosis: Stiffness of right knee, not elsewhere classified  Localized edema  Difficulty in walking, not elsewhere classified  Muscle weakness (generalized)     Problem List Patient Active Problem List   Diagnosis Date Noted  . S/P total knee arthroplasty 08/30/2016  . Weakness of left leg 03/13/2014  . Difficulty in walking(719.7) 03/13/2014  . Arthritis of knee, left 01/21/2014  . Cervicalgia 11/08/2011  . RHEUMATOID ARTHRITIS 02/01/2011  . RUPTURE ROTATOR CUFF 02/01/2011  . IMPINGEMENT SYNDROME 12/07/2010  . Primary localized osteoarthritis of right knee 09/28/2009  . Essential hypertension 05/13/2009    Deniece Ree PT, DPT [facility] [address] [place], [place], [address] Phone: [phone]   Fax:  [phone]  Name: Nicholas Delacruz MRN: [id number] Date of Birth: 10-10-1966

## 2016-10-12 ENCOUNTER — Ambulatory Visit (HOSPITAL_COMMUNITY): Payer: 59 | Admitting: Physical Therapy

## 2016-10-12 ENCOUNTER — Encounter (HOSPITAL_COMMUNITY): Payer: 59

## 2016-10-12 DIAGNOSIS — M25661 Stiffness of right knee, not elsewhere classified: Secondary | ICD-10-CM

## 2016-10-12 DIAGNOSIS — M6281 Muscle weakness (generalized): Secondary | ICD-10-CM

## 2016-10-12 DIAGNOSIS — R262 Difficulty in walking, not elsewhere classified: Secondary | ICD-10-CM

## 2016-10-12 DIAGNOSIS — R600 Localized edema: Secondary | ICD-10-CM

## 2016-10-12 NOTE — Therapy (Signed)
[place], [place], [address] Phone: [phone]   Fax:  [phone]  Physical Therapy Treatment  Patient Details  Name: Nicholas Delacruz MRN: [id number] Date of Birth: [birth year] Referring Provider: Marchia Bond   Encounter Date: 10/12/2016      PT End of Session - 10/12/16 1621    Visit Number 5   Number of Visits 15   Date for PT Re-Evaluation 10/24/16   Authorization Type [employer]    PT Start Time 1525   PT Stop Time 1605   PT Time Calculation (min) 40 min   Activity Tolerance Patient tolerated treatment well   Behavior During Therapy [facility] for tasks assessed/performed      Past Medical History:  Diagnosis Date  . Diabetes mellitus without complication ([place])   . GERD (gastroesophageal reflux disease)   . High blood pressure   . Obstructive sleep apnea on CPAP   . PONV (postoperative nausea and vomiting)    blood pressure dropped in recovery    Past Surgical History:  Procedure Laterality Date  . APPENDECTOMY    . KNEE ARTHROSCOPY Right 01/02/2015   Procedure: RIGHT KNEE ARTHROSCOPY, LATERAL AND MEDIAL MENISECTOMY;  Surgeon: Carole Civil, MD;  Location: [facility];  Service: Orthopedics;  Laterality: Right;  . KNEE ARTHROSCOPY WITH MEDIAL MENISECTOMY Left 06/25/2014   Procedure: KNEE ARTHROSCOPY WITH MEDIAL MENISECTOMY;  Surgeon: Carole Civil, MD;  Location: [facility];  Service: Orthopedics;  Laterality: Left;  . KNEE SURGERY Left   . MENISCUS DEBRIDEMENT Right 01/02/2015   Procedure: DEBRIDEMENT OF RIGHT KNEE JOINT;  Surgeon: Carole Civil, MD;  Location: [facility];  Service: Orthopedics;  Laterality: Right;  . right knee sark    . SHOULDER SURGERY     left  . TOTAL KNEE ARTHROPLASTY Right 08/30/2016   Procedure: TOTAL KNEE ARTHROPLASTY;  Surgeon: Marchia Bond, MD;  Location: [facility];  Service: Orthopedics;  Laterality: Right;    There were no vitals filed for this visit.       Subjective Assessment - 10/12/16 1530    Subjective Pt states his pain is 4/10 and has been doing his HEP. States he has difficulty doing his exercises due to environmental factors but working on it.     Currently in Pain? Yes   Pain Score 4    Pain Location Knee   Pain Orientation Right   Pain Descriptors / Indicators Aching                         [facility] Adult PT Treatment/Exercise - 10/12/16 0001      Knee/Hip Exercises: Stretches   Active Hamstring Stretch Right;3 reps;30 seconds   Active Hamstring Stretch Limitations 12 inch box    Knee: Self-Stretch to increase Flexion Right   Knee: Self-Stretch Limitations 10 reps, 10 seconds 12in step   Gastroc Stretch Both;3 reps;30 seconds   Gastroc Stretch Limitations slantboard     Knee/Hip Exercises: Aerobic   Nustep approx 7 minutes unsupervised, no resistance      Knee/Hip Exercises: Supine   Quad Sets Right;1 set;20 reps   Short Arc [employer] 15 reps   Short Arc Quad Sets Limitations 5"   Heel Slides 10 reps;AAROM   Heel Slides Limitations AAROM with rope     Manual Therapy   Manual Therapy Myofascial release;Joint mobilization   Manual therapy comments performed separately from all other skilled treatments  Joint Mobilization patella mobs all directions, tib/fib   Myofascial Release scar tissue massage                  PT Short Term Goals - 10/03/16 1629      PT SHORT TERM GOAL #1   Title Patient to demonstrate R knee ROM 0-100 degrees in order to improve mechanics and general gait pattern    Time 3   Period Weeks   Status New     PT SHORT TERM GOAL #2   Title Patient to be able to ambulate unlimited distances with no assistive device, R knee pain no more than 2/10, in order to improve general mobility    Time 3   Period Weeks   Status New     PT SHORT TERM GOAL #3   Title Patient to be able to ambulate at least 584ft during 3MWT in order to show improved mobility and facilitate return  to PLOF    Time 3   Period Weeks   Status New     PT SHORT TERM GOAL #4   Title Patient to be independent in correctly and consistently performing appropraite HEP, to be updated PRN    Time 1   Period Weeks   Status New           PT Long Term Goals - 10/03/16 1635      PT LONG TERM GOAL #1   Title Patient to demonstrate ROM R knee 0-115 degrees in order to improve general gait mechanics and improve general mobility    Time 6   Period Weeks   Status New     PT LONG TERM GOAL #2   Title Patient to be able to maintain SLS each LE for at least 30 seconds B in order to show improved balance skills and reduced fall risk    Time 6   Period Weeks   Status New     PT LONG TERM GOAL #3   Title Patient to be able to ambulate without difficulty over grass and gravel with no assistive device, minimal fall risk, in order to assist in return to PLOF    Time 6   Period Weeks   Status New     PT LONG TERM GOAL #4   Title Patient to be able to reciprocally ascend/descend full flight of stairs, U railing, reciprocal pattern, minimal unsteadiness, good eccentric control, in order to improve mobilty at home and within community    Time 6   Period Weeks   Status New               Plan - 10/12/16 1622    Clinical Impression Statement Continued with focus on ROM.  Limited in both directions but with flexion being most restricted.  Mentioned to patient maybe premedicating and having someone drive him  to therapy so we can push his ROM a little more.     Rehab Potential Good   Clinical Impairments Affecting Rehab Potential history of multiple knee injuries/surgeries; history of knee rehabilitation   PT Frequency Other (comment)  3x/week for 3 weeks, then 2x/week for 3 weeks    PT Duration 6 weeks   PT Treatment/Interventions ADLs/Self Care Home Management;Biofeedback;Cryotherapy;Moist Heat;DME Instruction;Gait training;Stair training;Functional mobility training;Therapeutic  activities;Therapeutic exercise;Balance training;Neuromuscular re-education;Patient/family education;Manual techniques;Scar mobilization;Passive range of motion;Energy conservation;Taping   PT Next Visit Plan Start on Nustep to assist in limbering knee for ROM. Focus on ROM of knee, continue correcting position of cane  and try SPC instead of quad cane. Edema control. Monitor open areas of incision for ongoing pus/dress appropriately with clean gauze.    PT Home Exercise Plan 10/9: quad sets and knee flexion stretch    Consulted and Agree with Plan of Care Patient      Patient will benefit from skilled therapeutic intervention in order to improve the following deficits and impairments:  Abnormal gait, Decreased skin integrity, Increased fascial restricitons, Improper body mechanics, Pain, Decreased mobility, Decreased coordination, Postural dysfunction, Decreased range of motion, Decreased strength, Hypomobility, Decreased balance, Difficulty walking, Increased edema, Impaired flexibility  Visit Diagnosis: Stiffness of right knee, not elsewhere classified  Localized edema  Difficulty in walking, not elsewhere classified  Muscle weakness (generalized)     Problem List Patient Active Problem List   Diagnosis Date Noted  . S/P total knee arthroplasty 08/30/2016  . Weakness of left leg 03/13/2014  . Difficulty in walking(719.7) 03/13/2014  . Arthritis of knee, left 01/21/2014  . Cervicalgia 11/08/2011  . RHEUMATOID ARTHRITIS 02/01/2011  . RUPTURE ROTATOR CUFF 02/01/2011  . IMPINGEMENT SYNDROME 12/07/2010  . Primary localized osteoarthritis of right knee 09/28/2009  . Essential hypertension 05/13/2009    Teena Irani, PTA/CLT [phone]  10/12/2016, 4:58 PM  [place] [facility] [address] [place], [place], [address] Phone: [phone]   Fax:  [phone]  Name: LUIS SHEARER MRN: [id number] Date of Birth: Apr 13, 1966

## 2016-10-14 ENCOUNTER — Ambulatory Visit (HOSPITAL_COMMUNITY): Payer: 59

## 2016-10-14 ENCOUNTER — Telehealth (HOSPITAL_COMMUNITY): Payer: Self-pay

## 2016-10-14 NOTE — Telephone Encounter (Signed)
He is sick and can not come in today °

## 2016-10-17 ENCOUNTER — Ambulatory Visit (HOSPITAL_COMMUNITY): Payer: 59 | Admitting: Physical Therapy

## 2016-10-17 DIAGNOSIS — R600 Localized edema: Secondary | ICD-10-CM

## 2016-10-17 DIAGNOSIS — M6281 Muscle weakness (generalized): Secondary | ICD-10-CM

## 2016-10-17 DIAGNOSIS — R262 Difficulty in walking, not elsewhere classified: Secondary | ICD-10-CM

## 2016-10-17 DIAGNOSIS — M25661 Stiffness of right knee, not elsewhere classified: Secondary | ICD-10-CM | POA: Diagnosis not present

## 2016-10-17 NOTE — Therapy (Signed)
[place], [place], [address] Phone: [phone]   Fax:  [phone]  Physical Therapy Treatment  Patient Details  Name: Nicholas Delacruz MRN: [id number] Date of Birth: [birth year] Referring Provider: Marchia Bond   Encounter Date: 10/17/2016      PT End of Session - 10/17/16 1612    Visit Number 6   Number of Visits 15   Date for PT Re-Evaluation 10/24/16   Authorization Type [place]    PT Start Time 1519   PT Stop Time 1550  deducted 7 minutes for time on Nustep    PT Time Calculation (min) 31 min   Activity Tolerance Patient tolerated treatment well   Behavior During Therapy [facility] for tasks assessed/performed      Past Medical History:  Diagnosis Date  . Diabetes mellitus without complication ([place])   . GERD (gastroesophageal reflux disease)   . High blood pressure   . Obstructive sleep apnea on CPAP   . PONV (postoperative nausea and vomiting)    blood pressure dropped in recovery    Past Surgical History:  Procedure Laterality Date  . APPENDECTOMY    . KNEE ARTHROSCOPY Right 01/02/2015   Procedure: RIGHT KNEE ARTHROSCOPY, LATERAL AND MEDIAL MENISECTOMY;  Surgeon: Carole Civil, MD;  Location: [facility];  Service: Orthopedics;  Laterality: Right;  . KNEE ARTHROSCOPY WITH MEDIAL MENISECTOMY Left 06/25/2014   Procedure: KNEE ARTHROSCOPY WITH MEDIAL MENISECTOMY;  Surgeon: Carole Civil, MD;  Location: [facility];  Service: Orthopedics;  Laterality: Left;  . KNEE SURGERY Left   . MENISCUS DEBRIDEMENT Right 01/02/2015   Procedure: DEBRIDEMENT OF RIGHT KNEE JOINT;  Surgeon: Carole Civil, MD;  Location: [facility];  Service: Orthopedics;  Laterality: Right;  . right knee sark    . SHOULDER SURGERY     left  . TOTAL KNEE ARTHROPLASTY Right 08/30/2016   Procedure: TOTAL KNEE ARTHROPLASTY;  Surgeon: Marchia Bond, MD;  Location: [facility];  Service: Orthopedics;  Laterality: Right;    There were no  vitals filed for this visit.      Subjective Assessment - 10/17/16 1521    Subjective Patient arrives today stating his pain is around 3-4/10, his HEP is going OK right now    Pertinent History no longer taking xarelto, HTN, L LE weakness, DM, OSA, GERD, extensive B knee surgical history    Currently in Pain? Yes   Pain Score 4    Pain Location Knee   Pain Orientation Right   Pain Descriptors / Indicators Aching   Pain Type Surgical pain   Pain Radiating Towards none    Pain Onset More than a month ago   Pain Frequency Constant   Aggravating Factors  bending it    Pain Relieving Factors ice    Effect of Pain on Daily Activities cannot perform PLOF                          [facility] Adult PT Treatment/Exercise - 10/17/16 0001      Knee/Hip Exercises: Stretches   Active Hamstring Stretch Right;3 reps;30 seconds   Active Hamstring Stretch Limitations 12 inch box    Knee: Self-Stretch to increase Flexion Right   Knee: Self-Stretch Limitations 15 reps, 10 seconds, 12 inch step    Gastroc Stretch Both;3 reps;30 seconds   Gastroc Stretch Limitations slantboard     Knee/Hip Exercises: Aerobic   Nustep approx 7 minutes unsupervised, no  resistance   not included in billing      Knee/Hip Exercises: Standing   Terminal Knee Extension Limitations 1x15 with 3 second holds      Knee/Hip Exercises: Seated   Hamstring Curl AAROM;Right;1 set;15 reps   Hamstring Limitations 3 second holds      Knee/Hip Exercises: Supine   Quad Sets Right;1 set;15 reps   Quad Sets Limitations 3 second holds      Manual Therapy   Manual Therapy Edema management;Joint mobilization   Manual therapy comments performed separately from all other skilled treatments    Edema Management Retro massage wtih LE elevated   Joint Mobilization patella mobilization all directions; knee flexion tib on femur gfrade III x3 rounds                 PT Education - 10/17/16 1612    Education provided  Yes   Education Details possible benefits of compression stocking    Person(s) Educated Patient   Methods Explanation   Comprehension Verbalized understanding          PT Short Term Goals - 10/03/16 1629      PT SHORT TERM GOAL #1   Title Patient to demonstrate R knee ROM 0-100 degrees in order to improve mechanics and general gait pattern    Time 3   Period Weeks   Status New     PT SHORT TERM GOAL #2   Title Patient to be able to ambulate unlimited distances with no assistive device, R knee pain no more than 2/10, in order to improve general mobility    Time 3   Period Weeks   Status New     PT SHORT TERM GOAL #3   Title Patient to be able to ambulate at least 551ft during 3MWT in order to show improved mobility and facilitate return to PLOF    Time 3   Period Weeks   Status New     PT SHORT TERM GOAL #4   Title Patient to be independent in correctly and consistently performing appropraite HEP, to be updated PRN    Time 1   Period Weeks   Status New           PT Long Term Goals - 10/03/16 1635      PT LONG TERM GOAL #1   Title Patient to demonstrate ROM R knee 0-115 degrees in order to improve general gait mechanics and improve general mobility    Time 6   Period Weeks   Status New     PT LONG TERM GOAL #2   Title Patient to be able to maintain SLS each LE for at least 30 seconds B in order to show improved balance skills and reduced fall risk    Time 6   Period Weeks   Status New     PT LONG TERM GOAL #3   Title Patient to be able to ambulate without difficulty over grass and gravel with no assistive device, minimal fall risk, in order to assist in return to PLOF    Time 6   Period Weeks   Status New     PT LONG TERM GOAL #4   Title Patient to be able to reciprocally ascend/descend full flight of stairs, U railing, reciprocal pattern, minimal unsteadiness, good eccentric control, in order to improve mobilty at home and within community    Time 6    Period Weeks   Status New  Plan - 10/17/16 1613    Clinical Impression Statement Continued focus on ROM, performing Nustep per patient request (not included in billing) to assist in improving knee ROM/warm up; also performed some ROM based exercises and performed manual to continue address localized edema and gross joint immobility. Patient continues to be quite limited especially in flexion and also displays quite a bit of localized edema- sent off referral to MD for possible compression stocking.    Rehab Potential Good   Clinical Impairments Affecting Rehab Potential history of multiple knee injuries/surgeries; history of knee rehabilitation   PT Frequency Other (comment)  3x/week for 3 weeks, then 2x/week    PT Duration 6 weeks   PT Treatment/Interventions ADLs/Self Care Home Management;Biofeedback;Cryotherapy;Moist Heat;DME Instruction;Gait training;Stair training;Functional mobility training;Therapeutic activities;Therapeutic exercise;Balance training;Neuromuscular re-education;Patient/family education;Manual techniques;Scar mobilization;Passive range of motion;Energy conservation;Taping   PT Next Visit Plan Start on Nustep for limbering knee for ROM, then focus on ROM. Check back in on possible compression stocking.    PT Home Exercise Plan 10/9: quad sets and knee flexion stretch    Consulted and Agree with Plan of Care Patient      Patient will benefit from skilled therapeutic intervention in order to improve the following deficits and impairments:  Abnormal gait, Decreased skin integrity, Increased fascial restricitons, Improper body mechanics, Pain, Decreased mobility, Decreased coordination, Postural dysfunction, Decreased range of motion, Decreased strength, Hypomobility, Decreased balance, Difficulty walking, Increased edema, Impaired flexibility  Visit Diagnosis: Stiffness of right knee, not elsewhere classified  Localized edema  Difficulty in walking, not  elsewhere classified  Muscle weakness (generalized)     Problem List Patient Active Problem List   Diagnosis Date Noted  . S/P total knee arthroplasty 08/30/2016  . Weakness of left leg 03/13/2014  . Difficulty in walking(719.7) 03/13/2014  . Arthritis of knee, left 01/21/2014  . Cervicalgia 11/08/2011  . RHEUMATOID ARTHRITIS 02/01/2011  . RUPTURE ROTATOR CUFF 02/01/2011  . IMPINGEMENT SYNDROME 12/07/2010  . Primary localized osteoarthritis of right knee 09/28/2009  . Essential hypertension 05/13/2009    Deniece Ree PT, DPT [facility] [address] [place], [place], [address] Phone: [phone]   Fax:  [phone]  Name: Nicholas Delacruz MRN: [id number] Date of Birth: 18-Aug-1966

## 2016-10-19 ENCOUNTER — Ambulatory Visit (HOSPITAL_COMMUNITY): Payer: 59

## 2016-10-19 DIAGNOSIS — M25661 Stiffness of right knee, not elsewhere classified: Secondary | ICD-10-CM

## 2016-10-19 DIAGNOSIS — M6281 Muscle weakness (generalized): Secondary | ICD-10-CM

## 2016-10-19 DIAGNOSIS — R262 Difficulty in walking, not elsewhere classified: Secondary | ICD-10-CM

## 2016-10-19 DIAGNOSIS — R600 Localized edema: Secondary | ICD-10-CM

## 2016-10-19 NOTE — Therapy (Signed)
[place], [place], [address] Phone: [phone]   Fax:  [phone]  Physical Therapy Treatment  Patient Details  Name: Nicholas Delacruz MRN: [id number] Date of Birth: 26-Jun-1966 Referring Provider: Marchia Bond  Encounter Date: 10/19/2016      PT End of Session - 10/19/16 1527    Visit Number 7   Number of Visits 15   Date for PT Re-Evaluation 10/24/16   Authorization Type [employer]    PT Start Time 1522   PT Stop Time 1600   PT Time Calculation (min) 38 min   Activity Tolerance Patient tolerated treatment well   Behavior During Therapy [facility] for tasks assessed/performed      Past Medical History:  Diagnosis Date  . Diabetes mellitus without complication ([place])   . GERD (gastroesophageal reflux disease)   . High blood pressure   . Obstructive sleep apnea on CPAP   . PONV (postoperative nausea and vomiting)    blood pressure dropped in recovery    Past Surgical History:  Procedure Laterality Date  . APPENDECTOMY    . KNEE ARTHROSCOPY Right 01/02/2015   Procedure: RIGHT KNEE ARTHROSCOPY, LATERAL AND MEDIAL MENISECTOMY;  Surgeon: Carole Civil, MD;  [facility];  Service: Orthopedics;  Laterality: Right;  . KNEE ARTHROSCOPY WITH MEDIAL MENISECTOMY Left 06/25/2014   Procedure: KNEE ARTHROSCOPY WITH MEDIAL MENISECTOMY;  Surgeon: Carole Civil, MD;  Location[facility];  Service: Orthopedics;  Laterality: Left;  . KNEE SURGERY Left   . MENISCUS DEBRIDEMENT Right 01/02/2015   Procedure: DEBRIDEMENT OF RIGHT KNEE JOINT;  Surgeon: Carole Civil, MD;  Location[facility];  Service: Orthopedics;  Laterality: Right;  . right knee sark    . SHOULDER SURGERY     left  . TOTAL KNEE ARTHROPLASTY Right 08/30/2016   Procedure: TOTAL KNEE ARTHROPLASTY;  Surgeon: Marchia Bond, MD;  Location: [facility];  Service: Orthopedics;  Laterality: Right;    There were no vitals filed for this visit.       Subjective Assessment - 10/19/16 1526    Subjective Pt stated pain scale 4/10 sore and achey Rt knee anterior.   Pertinent History no longer taking xarelto, HTN, L LE weakness, DM, OSA, GERD, extensive B knee surgical history    Patient Stated Goals return to PLOF    Currently in Pain? Yes   Pain Score 4    Pain Location Knee   Pain Orientation Right   Pain Descriptors / Indicators Aching   Pain Type Surgical pain   Pain Radiating Towards none    Pain Onset More than a month ago   Pain Frequency Constant   Aggravating Factors  bending it   Pain Relieving Factors ice   Effect of Pain on Daily Activities cannot perform PLOF            [facility] PT Assessment - 10/19/16 0001      Assessment   Medical Diagnosis R knee replacement    Referring Provider Marchia Bond   Onset Date/Surgical Date 08/30/16   Next MD Visit 11/07/2016                     [facility] Adult PT Treatment/Exercise - 10/19/16 0001      Knee/Hip Exercises: Stretches   Active Hamstring Stretch Right;3 reps;30 seconds   Active Hamstring Stretch Limitations 12 inch box    Knee: Self-Stretch to increase Flexion Right   Knee: Self-Stretch Limitations 15  reps, 10 seconds, 12 inch step    Gastroc Stretch Both;3 reps;30 seconds   Gastroc Stretch Limitations slantboard     Knee/Hip Exercises: Aerobic   Nustep 5 min following manual for knee mobility (no charge)     Knee/Hip Exercises: Land 2 minutes   Rocker Board Limitations R/L and A/P   Gait Training Gait training to improve heel to toe 2x 252ft     Knee/Hip Exercises: Seated   Heel Slides 2 sets;10 reps   Heel Slides Limitations AAROM     Knee/Hip Exercises: Supine   Quad Sets Right;1 set;15 reps   [employer] Limitations 3" holds   Short Arc [employer] 15 reps   Short Arc Quad Sets Limitations 5" holds   Heel Slides 10 reps;AAROM   Heel Slides Limitations AAROM with rope     Manual Therapy   Manual Therapy Edema  management;Joint mobilization   Manual therapy comments performed separately from all other skilled treatments    Edema Management Retro massage wtih LE elevated   Joint Mobilization patella mobilization all directions; knee flexion tib on femur gfrade III x3 rounds    Myofascial Release scar tissue massage                  PT Short Term Goals - 10/03/16 1629      PT SHORT TERM GOAL #1   Title Patient to demonstrate R knee ROM 0-100 degrees in order to improve mechanics and general gait pattern    Time 3   Period Weeks   Status New     PT SHORT TERM GOAL #2   Title Patient to be able to ambulate unlimited distances with no assistive device, R knee pain no more than 2/10, in order to improve general mobility    Time 3   Period Weeks   Status New     PT SHORT TERM GOAL #3   Title Patient to be able to ambulate at least 511ft during 3MWT in order to show improved mobility and facilitate return to PLOF    Time 3   Period Weeks   Status New     PT SHORT TERM GOAL #4   Title Patient to be independent in correctly and consistently performing appropraite HEP, to be updated PRN    Time 1   Period Weeks   Status New           PT Long Term Goals - 10/03/16 1635      PT LONG TERM GOAL #1   Title Patient to demonstrate ROM R knee 0-115 degrees in order to improve general gait mechanics and improve general mobility    Time 6   Period Weeks   Status New     PT LONG TERM GOAL #2   Title Patient to be able to maintain SLS each LE for at least 30 seconds B in order to show improved balance skills and reduced fall risk    Time 6   Period Weeks   Status New     PT LONG TERM GOAL #3   Title Patient to be able to ambulate without difficulty over grass and gravel with no assistive device, minimal fall risk, in order to assist in return to PLOF    Time 6   Period Weeks   Status New     PT LONG TERM GOAL #4   Title Patient to be able to reciprocally ascend/descend full  flight of stairs, U  railing, reciprocal pattern, minimal unsteadiness, good eccentric control, in order to improve mobilty at home and within community    Time [place] - 10/19/16 1740    Clinical Impression Statement Session focus on improving ROM with therex, manual and gait training to improve knee mobiltiy.  Pt continues to present with increased edema proximal knee limiting mobility.  Manual techniques complete for edema control to improve knee mobility with Nustep to warm up followed by therex.  Included gait training to improve knee mobiltiy with cueing for heel to toe and equal stance phase to reduce flat footing and antalgic gait mechanics.  No signed referral received with MD signature for compression stockings, send 2nd request next week if not back by Friday (initially sent on 10/17/2016)   Clinical Impairments Affecting Rehab Potential history of multiple knee injuries/surgeries; history of knee rehabilitation   PT Frequency --  3x/wk for 3 weeks then reduce to 2x/w   PT Duration 6 weeks   PT Treatment/Interventions ADLs/Self Care Home Management;Biofeedback;Cryotherapy;Moist Heat;DME Instruction;Gait training;Stair training;Functional mobility training;Therapeutic activities;Therapeutic exercise;Balance training;Neuromuscular re-education;Patient/family education;Manual techniques;Scar mobilization;Passive range of motion;Energy conservation;Taping   PT Next Visit Plan Start on Nustep for limbering knee for ROM, then focus on ROM. Check back in on possible compression stocking, may need to send 2nd request.      Patient will benefit from skilled therapeutic intervention in order to improve the following deficits and impairments:  Abnormal gait, Decreased skin integrity, Increased fascial restricitons, Improper body mechanics, Pain, Decreased mobility, Decreased coordination, Postural dysfunction, Decreased range of motion, Decreased  strength, Hypomobility, Decreased balance, Difficulty walking, Increased edema, Impaired flexibility  Visit Diagnosis: Stiffness of right knee, not elsewhere classified  Localized edema  Difficulty in walking, not elsewhere classified  Muscle weakness (generalized)     Problem List Patient Active Problem List   Diagnosis Date Noted  . S/P total knee arthroplasty 08/30/2016  . Weakness of left leg 03/13/2014  . Difficulty in walking(719.7) 03/13/2014  . Arthritis of knee, left 01/21/2014  . Cervicalgia 11/08/2011  . RHEUMATOID ARTHRITIS 02/01/2011  . RUPTURE ROTATOR CUFF 02/01/2011  . IMPINGEMENT SYNDROME 12/07/2010  . Primary localized osteoarthritis of right knee 09/28/2009  . Essential hypertension 05/13/2009   Ihor [place], [place]; [place]  Aldona Lento 10/19/2016, 5:48 PM  [facility] [address] [place], [place], [address] Phone: [phone]   Fax:  [phone]  Name: Nicholas Delacruz MRN: [id number] Date of Birth: 09-15-1966

## 2016-10-21 ENCOUNTER — Ambulatory Visit (HOSPITAL_COMMUNITY): Payer: 59

## 2016-10-21 DIAGNOSIS — R600 Localized edema: Secondary | ICD-10-CM

## 2016-10-21 DIAGNOSIS — M6281 Muscle weakness (generalized): Secondary | ICD-10-CM

## 2016-10-21 DIAGNOSIS — M25661 Stiffness of right knee, not elsewhere classified: Secondary | ICD-10-CM

## 2016-10-21 DIAGNOSIS — R262 Difficulty in walking, not elsewhere classified: Secondary | ICD-10-CM

## 2016-10-21 NOTE — Therapy (Signed)
[facility], [place], [address] Phone: [phone]   Fax:  [phone]  Physical Therapy Treatment  Patient Details  Name: Nicholas Delacruz MRN: [id number] Date of Birth: 1966-06-30 Referring Provider: Marchia Bond  Encounter Date: 10/21/2016      PT End of Session - 10/21/16 1448    Visit Number 8   Number of Visits 15   Date for PT Re-Evaluation 10/24/16   Authorization Type [employer]    PT Start Time [id number]   PT Stop Time 1517   PT Time Calculation (min) 38 min   Activity Tolerance Patient tolerated treatment well   Behavior During Therapy [facility] for tasks assessed/performed      Past Medical History:  Diagnosis Date  . Diabetes mellitus without complication ([place])   . GERD (gastroesophageal reflux disease)   . High blood pressure   . Obstructive sleep apnea on CPAP   . PONV (postoperative nausea and vomiting)    blood pressure dropped in recovery    Past Surgical History:  Procedure Laterality Date  . APPENDECTOMY    . KNEE ARTHROSCOPY Right 01/02/2015   Procedure: RIGHT KNEE ARTHROSCOPY, LATERAL AND MEDIAL MENISECTOMY;  Surgeon: Carole Civil, MD;  Location: [facility];  Service: Orthopedics;  Laterality: Right;  . KNEE ARTHROSCOPY WITH MEDIAL MENISECTOMY Left 06/25/2014   Procedure: KNEE ARTHROSCOPY WITH MEDIAL MENISECTOMY;  Surgeon: Carole Civil, MD;  Location: [facility];  Service: Orthopedics;  Laterality: Left;  . KNEE SURGERY Left   . MENISCUS DEBRIDEMENT Right 01/02/2015   Procedure: DEBRIDEMENT OF RIGHT KNEE JOINT;  Surgeon: Carole Civil, MD;  Location: [facility];  Service: Orthopedics;  Laterality: Right;  . right knee sark    . SHOULDER SURGERY     left  . TOTAL KNEE ARTHROPLASTY Right 08/30/2016   Procedure: TOTAL KNEE ARTHROPLASTY;  Surgeon: Marchia Bond, MD;  Location: [facility];  Service: Orthopedics;  Laterality: Right;    There were no vitals filed for this visit.       Subjective Assessment - 10/21/16 1440    Subjective Pt stated he took pain meds with lunch and feels increased nausea at entrance today.  Current pain scale 4/10.     Pertinent History no longer taking xarelto, HTN, L LE weakness, DM, OSA, GERD, extensive B knee surgical history    Patient Stated Goals return to PLOF    Currently in Pain? Yes   Pain Score 4    Pain Location Knee   Pain Orientation Right   Pain Descriptors / Indicators Aching;Nagging   Pain Type Surgical pain   Pain Radiating Towards none   Pain Onset More than a month ago   Pain Frequency Constant   Aggravating Factors  bending it   Pain Relieving Factors ice   Effect of Pain on Daily Activities cannot perform PLOF                         [facility] Adult PT Treatment/Exercise - 10/21/16 0001      Ambulation/Gait   Ambulation Distance (Feet) 226 Feet   Gait Comments Cueing to increase stride length and heel to toe pattern     Knee/Hip Exercises: Stretches   Active Hamstring Stretch Right;3 reps;30 seconds   Active Hamstring Stretch Limitations supine with rope   Quad Stretch 3 reps;30 seconds   Quad Stretch Limitations prone with rope     Knee/Hip Exercises: Aerobic  Nustep 5 min following manual for knee mobility (no charge)     Knee/Hip Exercises: Standing   Heel Raises 15 reps   Heel Raises Limitations Toe raises with cueing for form   Rocker Board 2 minutes   Rocker Board Limitations R/L and A/P   Gait Training Gait training to improve heel to toe 2x 257ft     Knee/Hip Exercises: Seated   Heel Slides 2 sets;10 reps   Heel Slides Limitations AAROM     Knee/Hip Exercises: Supine   Short Arc Quad Sets 15 reps   Short Arc Quad Sets Limitations 5" holds   Heel Slides 15 reps                  PT Short Term Goals - 10/03/16 1629      PT SHORT TERM GOAL #1   Title Patient to demonstrate R knee ROM 0-100 degrees in order to improve mechanics and general gait pattern    Time 3    Period Weeks   Status New     PT SHORT TERM GOAL #2   Title Patient to be able to ambulate unlimited distances with no assistive device, R knee pain no more than 2/10, in order to improve general mobility    Time 3   Period Weeks   Status New     PT SHORT TERM GOAL #3   Title Patient to be able to ambulate at least 544ft during 3MWT in order to show improved mobility and facilitate return to PLOF    Time 3   Period Weeks   Status New     PT SHORT TERM GOAL #4   Title Patient to be independent in correctly and consistently performing appropraite HEP, to be updated PRN    Time 1   Period Weeks   Status New           PT Long Term Goals - 10/03/16 1635      PT LONG TERM GOAL #1   Title Patient to demonstrate ROM R knee 0-115 degrees in order to improve general gait mechanics and improve general mobility    Time 6   Period Weeks   Status New     PT LONG TERM GOAL #2   Title Patient to be able to maintain SLS each LE for at least 30 seconds B in order to show improved balance skills and reduced fall risk    Time 6   Period Weeks   Status New     PT LONG TERM GOAL #3   Title Patient to be able to ambulate without difficulty over grass and gravel with no assistive device, minimal fall risk, in order to assist in return to PLOF    Time 6   Period Weeks   Status New     PT LONG TERM GOAL #4   Title Patient to be able to reciprocally ascend/descend full flight of stairs, U railing, reciprocal pattern, minimal unsteadiness, good eccentric control, in order to improve mobilty at home and within community    Time 6   Period Weeks   Status New               Plan - 10/21/16 1507    Clinical Impression Statement Continued session foucs on improving ROM and gait mechanics.  Pt c/o feeling nausea through session due to pain meds during lunch.  Knee continues to present with increased edema proximal knee and limited patella mobility.  Manual techniques complete  to assist  with edema control and to improve patella mobiliy.  Refaxed referral for compression hose (2nd attempt) Cotninued therex focus on quad strengthening to improve extension and functional stretches to assist with knee mobility.  Pt able to demonstrate all therex with minimal cueing required.   Does continues to require cueing for heel to toe pattern and to equalize stance phase with gait, added heel to toe raises for strenghtening and rockerboard to improve weight distribution.     Rehab Potential Good   Clinical Impairments Affecting Rehab Potential history of multiple knee injuries/surgeries; history of knee rehabilitation   PT Treatment/Interventions ADLs/Self Care Home Management;Biofeedback;Cryotherapy;Moist Heat;DME Instruction;Gait training;Stair training;Functional mobility training;Therapeutic activities;Therapeutic exercise;Balance training;Neuromuscular re-education;Patient/family education;Manual techniques;Scar mobilization;Passive range of motion;Energy conservation;Taping   PT Next Visit Plan Start on Nustep for limbering knee for ROM, then focus on ROM and gait.  F/U on 2nd attempt for compression hose.      Patient will benefit from skilled therapeutic intervention in order to improve the following deficits and impairments:  Abnormal gait, Decreased skin integrity, Increased fascial restricitons, Improper body mechanics, Pain, Decreased mobility, Decreased coordination, Postural dysfunction, Decreased range of motion, Decreased strength, Hypomobility, Decreased balance, Difficulty walking, Increased edema, Impaired flexibility  Visit Diagnosis: Stiffness of right knee, not elsewhere classified  Localized edema  Difficulty in walking, not elsewhere classified  Muscle weakness (generalized)     Problem List Patient Active Problem List   Diagnosis Date Noted  . S/P total knee arthroplasty 08/30/2016  . Weakness of left leg 03/13/2014  . Difficulty in walking(719.7) 03/13/2014   . Arthritis of knee, left 01/21/2014  . Cervicalgia 11/08/2011  . RHEUMATOID ARTHRITIS 02/01/2011  . RUPTURE ROTATOR CUFF 02/01/2011  . IMPINGEMENT SYNDROME 12/07/2010  . Primary localized osteoarthritis of right knee 09/28/2009  . Essential hypertension 05/13/2009   Ihor [place], [place]; [place]  Aldona Lento 10/21/2016, 4:23 PM  [facility] [address] [place], [place], [address] Phone: [phone]   Fax:  [phone]  Name: Nicholas Delacruz MRN: [id number] Date of Birth: Mar 23, 1966

## 2016-10-24 ENCOUNTER — Ambulatory Visit (HOSPITAL_COMMUNITY): Payer: 59 | Admitting: Physical Therapy

## 2016-10-24 ENCOUNTER — Encounter (HOSPITAL_COMMUNITY): Payer: 59 | Admitting: Physical Therapy

## 2016-10-24 DIAGNOSIS — M25661 Stiffness of right knee, not elsewhere classified: Secondary | ICD-10-CM

## 2016-10-24 DIAGNOSIS — R600 Localized edema: Secondary | ICD-10-CM

## 2016-10-24 DIAGNOSIS — M6281 Muscle weakness (generalized): Secondary | ICD-10-CM

## 2016-10-24 DIAGNOSIS — R262 Difficulty in walking, not elsewhere classified: Secondary | ICD-10-CM

## 2016-10-24 NOTE — Therapy (Signed)
[facility] [address] [place], [place], [address] Phone: [phone]   Fax:  [phone]  Physical Therapy Treatment (Re-Assessment)  Patient Details  Name: Nicholas Delacruz MRN: [id number] Date of Birth: 11-27-1966 Referring Provider: Marchia Bond  Encounter Date: 10/24/2016      PT End of Session - 10/24/16 1752    Visit Number 9   Number of Visits 18   Date for PT Re-Evaluation 11/14/16   Authorization Type [employer]    PT Start Time 1528  started session on Nustep for ROM, not billed    PT Stop Time 1557   PT Time Calculation (min) 29 min   Activity Tolerance Patient tolerated treatment well   Behavior During Therapy [facility] for tasks assessed/performed      Past Medical History:  Diagnosis Date  . Diabetes mellitus without complication ([place])   . GERD (gastroesophageal reflux disease)   . High blood pressure   . Obstructive sleep apnea on CPAP   . PONV (postoperative nausea and vomiting)    blood pressure dropped in recovery    Past Surgical History:  Procedure Laterality Date  . APPENDECTOMY    . KNEE ARTHROSCOPY Right 01/02/2015   Procedure: RIGHT KNEE ARTHROSCOPY, LATERAL AND MEDIAL MENISECTOMY;  Surgeon: Carole Civil, MD;  Location: [facility];  Service: Orthopedics;  Laterality: Right;  . KNEE ARTHROSCOPY WITH MEDIAL MENISECTOMY Left 06/25/2014   Procedure: KNEE ARTHROSCOPY WITH MEDIAL MENISECTOMY;  Surgeon: Carole Civil, MD;  Location: [facility];  Service: Orthopedics;  Laterality: Left;  . KNEE SURGERY Left   . MENISCUS DEBRIDEMENT Right 01/02/2015   Procedure: DEBRIDEMENT OF RIGHT KNEE JOINT;  Surgeon: Carole Civil, MD;  Location: [facility];  Service: Orthopedics;  Laterality: Right;  . right knee sark    . SHOULDER SURGERY     left  . TOTAL KNEE ARTHROPLASTY Right 08/30/2016   Procedure: TOTAL KNEE ARTHROPLASTY;  Surgeon: Marchia Bond, MD;  Location: [facility];  Service: Orthopedics;  Laterality: Right;     There were no vitals filed for this visit.      Subjective Assessment - 10/24/16 1529    Subjective Patient states that he feels like he's stuck in terms of ROM; the pain has improved quite a bit over the past 3 weeks although he is having a very hard time sleeping at night. Besides feeling stuck on his ROM, he feels like he has generally fallen out of shape and does not have a lot of energy.    Pertinent History no longer taking xarelto, HTN, L LE weakness, DM, OSA, GERD, extensive B knee surgical history    How long can you sit comfortably? 10/30- 30-45 minutes    How long can you stand comfortably? 10/30- 10-15 minutes    How long can you walk comfortably? 10/30- 5 minutes minimum    Patient Stated Goals return to PLOF    Currently in Pain? Yes   Pain Score 4    Pain Location Knee   Pain Orientation Right            [facility] PT Assessment - 10/24/16 0001      AROM   Right Knee Extension 9   Right Knee Flexion 76     Strength   Right Hip Flexion 5/5   Right Hip Extension 5/5   Right Hip ABduction 4+/5   Left Hip Flexion 5/5   Left Hip Extension 5/5   Left Hip ABduction 4+/5  Right Knee Flexion 4+/5   Right Knee Extension 5/5   Left Knee Flexion 4+/5   Left Knee Extension 4+/5   Right Ankle Dorsiflexion 5/5   Left Ankle Dorsiflexion 5/5     6 minute walk test results    Aerobic Endurance Distance Walked 522   Endurance additional comments 3MWT; 0.27ms      High Level Balance   High Level Balance Comments TUG 10.6 no device; SLS R LE 10 seconds, L 17 seconds                              PT Education - 10/24/16 1752    Education provided Yes   Education Details compression stocking order/where to obtain stocking; progress thus far; POC    Person(s) Educated Patient   Methods Explanation   Comprehension Verbalized understanding          PT Short Term Goals - 10/24/16 1547      PT SHORT TERM GOAL #1   Title Patient to demonstrate  R knee ROM 0-100 degrees in order to improve mechanics and general gait pattern    Baseline 10/30- 9 to 76   Time 3   Period Weeks   Status On-going     PT SHORT TERM GOAL #2   Title Patient to be able to ambulate unlimited distances with no assistive device, R knee pain no more than 2/10, in order to improve general mobility    Baseline 10/30- walking without device, R knee pain 3-4/10   Time 3   Period Weeks   Status Partially Met     PT SHORT TERM GOAL #3   Title Patient to be able to ambulate at least 5547fduring 3MWT in order to show improved mobility and facilitate return to PLOF    Baseline 10/30- 52245f  Time 3   Period Weeks   Status On-going     PT SHORT TERM GOAL #4   Title Patient to be independent in correctly and consistently performing appropraite HEP, to be updated PRN    Baseline 10/30- reports compliance    Time 1   Period Weeks   Status Achieved           PT Long Term Goals - 10/24/16 1548      PT LONG TERM GOAL #1   Title Patient to demonstrate ROM R knee 0-115 degrees in order to improve general gait mechanics and improve general mobility    Baseline 10/30- 9-76   Time 6   Period Weeks   Status On-going     PT LONG TERM GOAL #2   Title Patient to be able to maintain SLS each LE for at least 30 seconds B in order to show improved balance skills and reduced fall risk    Baseline 10/30- 10-20 seconds    Time 6   Period Weeks   Status On-going     PT LONG TERM GOAL #3   Title Patient to be able to ambulate without difficulty over grass and gravel with no assistive device, minimal fall risk, in order to assist in return to PLOF    Baseline 10/30- reports succcess but this is more difficult    Time 6   Period Weeks   Status On-going     PT LONG TERM GOAL #4   Title Patient to be able to reciprocally ascend/descend full flight of stairs, U railing, reciprocal pattern, minimal unsteadiness,  good eccentric control, in order to improve mobilty at  home and within community    Time 6   Period Weeks   Status On-going               Plan - 10/24/16 1753    Clinical Impression Statement Re-assessment performed today. Patient is making slow but steady progress with skilled PT services however continues to be most limited in the ROM of his surgical LE, although he does continue to demonstrate significant impairments in gait, stair navigation, and functional balance as well. Compression stocking referral was returned by MD and patient was given this/educated on local places to obtain stocking. While patient's knee does seem to be responding well to overpressure in extension by DPT, the joint remains fairly rigid with overpressure into flexion in prone with a firm end-feel; suspect that ROM may be impacted by amount of scar tissue present in area due to multiple surgeries. Recommend continuation of skilled PT services to continue addressing functional limitations and assist in reaching optimal level of function.    Rehab Potential Good   Clinical Impairments Affecting Rehab Potential history of multiple knee injuries/surgeries; history of knee rehabilitation   PT Frequency 3x / week   PT Duration 6 weeks   PT Treatment/Interventions ADLs/Self Care Home Management;Biofeedback;Cryotherapy;Moist Heat;DME Instruction;Gait training;Stair training;Functional mobility training;Therapeutic activities;Therapeutic exercise;Balance training;Neuromuscular re-education;Patient/family education;Manual techniques;Scar mobilization;Passive range of motion;Energy conservation;Taping   PT Next Visit Plan Start on Nustep for limbering knee for ROM, then focus on ROM and gait.     PT Home Exercise Plan 10/9: quad sets and knee flexion stretch    Consulted and Agree with Plan of Care Patient      Patient will benefit from skilled therapeutic intervention in order to improve the following deficits and impairments:  Abnormal gait, Decreased skin integrity,  Increased fascial restricitons, Improper body mechanics, Pain, Decreased mobility, Decreased coordination, Postural dysfunction, Decreased range of motion, Decreased strength, Hypomobility, Decreased balance, Difficulty walking, Increased edema, Impaired flexibility  Visit Diagnosis: Stiffness of right knee, not elsewhere classified  Localized edema  Difficulty in walking, not elsewhere classified  Muscle weakness (generalized)     Problem List Patient Active Problem List   Diagnosis Date Noted  . S/P total knee arthroplasty 08/30/2016  . Weakness of left leg 03/13/2014  . Difficulty in walking(719.7) 03/13/2014  . Arthritis of knee, left 01/21/2014  . Cervicalgia 11/08/2011  . RHEUMATOID ARTHRITIS 02/01/2011  . RUPTURE ROTATOR CUFF 02/01/2011  . IMPINGEMENT SYNDROME 12/07/2010  . Primary localized osteoarthritis of right knee 09/28/2009  . Essential hypertension 05/13/2009    Deniece Ree PT, DPT [facility] [address] [place], [place], [address] Phone: [phone]   Fax:  [phone]  Name: ZLATAN HORNBACK MRN: [id number] Date of Birth: May 21, 1966

## 2016-10-26 ENCOUNTER — Encounter (HOSPITAL_COMMUNITY): Payer: 59 | Admitting: Physical Therapy

## 2016-10-26 ENCOUNTER — Ambulatory Visit (HOSPITAL_COMMUNITY): Payer: 59 | Attending: Orthopedic Surgery

## 2016-10-26 DIAGNOSIS — M25661 Stiffness of right knee, not elsewhere classified: Secondary | ICD-10-CM

## 2016-10-26 DIAGNOSIS — R600 Localized edema: Secondary | ICD-10-CM | POA: Insufficient documentation

## 2016-10-26 DIAGNOSIS — M6281 Muscle weakness (generalized): Secondary | ICD-10-CM | POA: Diagnosis present

## 2016-10-26 DIAGNOSIS — R262 Difficulty in walking, not elsewhere classified: Secondary | ICD-10-CM | POA: Insufficient documentation

## 2016-10-26 NOTE — Therapy (Signed)
[place], [place], [address] Phone: [phone]   Fax:  [phone]  Physical Therapy Treatment  Patient Details  Name: Nicholas Delacruz MRN: [id number] Date of Birth: 1966/05/29 Referring Provider: Marchia Bond  Encounter Date: 10/26/2016      PT End of Session - 10/26/16 1609    Visit Number 10   Number of Visits 18   Date for PT Re-Evaluation 11/14/16   Authorization Type [employer]    PT Start Time 1601  Began Nustep x 7 min at beginning of session,  no charge   PT Stop Time 1647   PT Time Calculation (min) 46 min   Activity Tolerance Patient tolerated treatment well   Behavior During Therapy [facility] for tasks assessed/performed      Past Medical History:  Diagnosis Date  . Diabetes mellitus without complication ([place])   . GERD (gastroesophageal reflux disease)   . High blood pressure   . Obstructive sleep apnea on CPAP   . PONV (postoperative nausea and vomiting)    blood pressure dropped in recovery    Past Surgical History:  Procedure Laterality Date  . APPENDECTOMY    . KNEE ARTHROSCOPY Right 01/02/2015   Procedure: RIGHT KNEE ARTHROSCOPY, LATERAL AND MEDIAL MENISECTOMY;  Surgeon: Carole Civil, MD;  Location: [facility];  Service: Orthopedics;  Laterality: Right;  . KNEE ARTHROSCOPY WITH MEDIAL MENISECTOMY Left 06/25/2014   Procedure: KNEE ARTHROSCOPY WITH MEDIAL MENISECTOMY;  Surgeon: Carole Civil, MD;  Location: [facility];  Service: Orthopedics;  Laterality: Left;  . KNEE SURGERY Left   . MENISCUS DEBRIDEMENT Right 01/02/2015   Procedure: DEBRIDEMENT OF RIGHT KNEE JOINT;  Surgeon: Carole Civil, MD;  Location: [facility];  Service: Orthopedics;  Laterality: Right;  . right knee sark    . SHOULDER SURGERY     left  . TOTAL KNEE ARTHROPLASTY Right 08/30/2016   Procedure: TOTAL KNEE ARTHROPLASTY;  Surgeon: Marchia Bond, MD;  Location: [facility];  Service: Orthopedics;  Laterality: Right;     There were no vitals filed for this visit.      Subjective Assessment - 10/26/16 1605    Subjective Pt stated he had rough night last night, increased pain on lateral aspect of knee.  Current pain scale 4/10 today.   Pertinent History no longer taking xarelto, HTN, L LE weakness, DM, OSA, GERD, extensive B knee surgical history    Patient Stated Goals return to PLOF    Currently in Pain? Yes   Pain Score 4    Pain Location Knee   Pain Orientation Right;Lateral   Pain Descriptors / Indicators Tightness;Aching;Nagging   Pain Type Surgical pain   Pain Onset More than a month ago   Pain Frequency Constant   Aggravating Factors  bending it   Pain Relieving Factors ice   Effect of Pain on Daily Activities cannot perform PLOF                         [facility] Adult PT Treatment/Exercise - 10/26/16 0001      Knee/Hip Exercises: Aerobic   Nustep (P)  7 min initial session      Knee/Hip Exercises: Standing   Heel Raises (P)  15 reps   Heel Raises Limitations (P)  Toe raises     Knee/Hip Exercises: Seated   Heel Slides (P)  2 sets;10 reps   Heel Slides Limitations (P)  AAROM  Knee/Hip Exercises: Supine   Quad Sets (P)  Right;1 set;15 reps   Quad Sets Limitations (P)  3" holds   Short Arc [employer] (P)  15 reps   Short Arc Quad Sets Limitations (P)  5" holds   Heel Slides (P)  15 reps   Heel Slides Limitations (P)  AAROM with rope     Knee/Hip Exercises: Prone   Hamstring Curl (P)  1 set;10 reps   Hamstring Curl Limitations (P)  overpressure from PT, for AAROM      Manual Therapy   Manual Therapy (P)  Myofascial release;Joint mobilization;Passive ROM   Manual therapy comments performed separately from all other skilled treatments    Joint Mobilization patella mobilization all directions; knee flexion tib on femur gfrade III x3 rounds    Soft tissue mobilization scar massage, patella mobilization all directions, deep tissue work to distal quad    Myofascial  Release scar tissue massage                  PT Short Term Goals - 10/24/16 1547      PT SHORT TERM GOAL #1   Title Patient to demonstrate R knee ROM 0-100 degrees in order to improve mechanics and general gait pattern    Baseline 10/30- 9 to 76   Time 3   Period Weeks   Status On-going     PT SHORT TERM GOAL #2   Title Patient to be able to ambulate unlimited distances with no assistive device, R knee pain no more than 2/10, in order to improve general mobility    Baseline 10/30- walking without device, R knee pain 3-4/10   Time 3   Period Weeks   Status Partially Met     PT SHORT TERM GOAL #3   Title Patient to be able to ambulate at least 519f during 3MWT in order to show improved mobility and facilitate return to PLOF    Baseline 10/30- 5248f   Time 3   Period Weeks   Status On-going     PT SHORT TERM GOAL #4   Title Patient to be independent in correctly and consistently performing appropraite HEP, to be updated PRN    Baseline 10/30- reports compliance    Time 1   Period Weeks   Status Achieved           PT Long Term Goals - 10/24/16 1548      PT LONG TERM GOAL #1   Title Patient to demonstrate ROM R knee 0-115 degrees in order to improve general gait mechanics and improve general mobility    Baseline 10/30- 9-76   Time 6   Period Weeks   Status On-going     PT LONG TERM GOAL #2   Title Patient to be able to maintain SLS each LE for at least 30 seconds B in order to show improved balance skills and reduced fall risk    Baseline 10/30- 10-20 seconds    Time 6   Period Weeks   Status On-going     PT LONG TERM GOAL #3   Title Patient to be able to ambulate without difficulty over grass and gravel with no assistive device, minimal fall risk, in order to assist in return to PLOF    Baseline 10/30- reports succcess but this is more difficult    Time 6   Period Weeks   Status On-going     PT LONG TERM GOAL #4   Title Patient  to be able to  reciprocally ascend/descend full flight of stairs, U railing, reciprocal pattern, minimal unsteadiness, good eccentric control, in order to improve mobilty at home and within community    Time 6   Period Weeks   Status On-going               Plan - 10/26/16 1625    Clinical Impression Statement Continued session focus on improving AROM.  Began session on Nustep for knee mobility then manual technqiues to address edema and scar tissue adhesions.  Pt does continue to present with excessive scar adhesions as this is the 8th surgery to the knee.  Pt educated on scar tissue massage technqiues and encouraged to begin at home.  Therex focus on functional stretches, quad strengthening and overall knee mobility exercises.  Pt able to complete all therex with proper form and technqiue.  Did reports increased pain folllowing knee flexion activities.  Reports plans to purchase compression hose later this week to assist with edema control.     Clinical Impairments Affecting Rehab Potential history of multiple knee injuries/surgeries; history of knee rehabilitation   PT Frequency 3x / week   PT Duration 6 weeks   PT Treatment/Interventions ADLs/Self Care Home Management;Biofeedback;Cryotherapy;Moist Heat;DME Instruction;Gait training;Stair training;Functional mobility training;Therapeutic activities;Therapeutic exercise;Balance training;Neuromuscular re-education;Patient/family education;Manual techniques;Scar mobilization;Passive range of motion;Energy conservation;Taping   PT Next Visit Plan Start on Nustep for limbering knee for ROM, then focus on ROM and gait.        Patient will benefit from skilled therapeutic intervention in order to improve the following deficits and impairments:  Abnormal gait, Decreased skin integrity, Increased fascial restricitons, Improper body mechanics, Pain, Decreased mobility, Decreased coordination, Postural dysfunction, Decreased range of motion, Decreased strength,  Hypomobility, Decreased balance, Difficulty walking, Increased edema, Impaired flexibility  Visit Diagnosis: Stiffness of right knee, not elsewhere classified  Localized edema  Difficulty in walking, not elsewhere classified  Muscle weakness (generalized)     Problem List Patient Active Problem List   Diagnosis Date Noted  . S/P total knee arthroplasty 08/30/2016  . Weakness of left leg 03/13/2014  . Difficulty in walking(719.7) 03/13/2014  . Arthritis of knee, left 01/21/2014  . Cervicalgia 11/08/2011  . RHEUMATOID ARTHRITIS 02/01/2011  . RUPTURE ROTATOR CUFF 02/01/2011  . IMPINGEMENT SYNDROME 12/07/2010  . Primary localized osteoarthritis of right knee 09/28/2009  . Essential hypertension 05/13/2009   Nicholas Delacruz, [place]; Nicholas  Aldona Delacruz 10/26/2016, 5:27 PM  [place] [address] [place], [place], [address] Phone: [phone]   Fax:  [phone]  Name: Nicholas Delacruz MRN: [id number] Date of Birth: 12-12-1966

## 2016-10-28 ENCOUNTER — Encounter (HOSPITAL_COMMUNITY): Payer: 59

## 2016-10-31 ENCOUNTER — Telehealth (HOSPITAL_COMMUNITY): Payer: Self-pay | Admitting: Family Medicine

## 2016-10-31 ENCOUNTER — Ambulatory Visit (HOSPITAL_COMMUNITY): Payer: 59 | Admitting: Physical Therapy

## 2016-10-31 NOTE — Telephone Encounter (Signed)
Patient  is now having right knee pain and can't come today.

## 2016-11-02 ENCOUNTER — Ambulatory Visit (HOSPITAL_COMMUNITY): Payer: 59

## 2016-11-02 DIAGNOSIS — M25661 Stiffness of right knee, not elsewhere classified: Secondary | ICD-10-CM

## 2016-11-02 DIAGNOSIS — M6281 Muscle weakness (generalized): Secondary | ICD-10-CM

## 2016-11-02 DIAGNOSIS — R600 Localized edema: Secondary | ICD-10-CM

## 2016-11-02 DIAGNOSIS — R262 Difficulty in walking, not elsewhere classified: Secondary | ICD-10-CM

## 2016-11-02 NOTE — Therapy (Signed)
[facility] [address] [place], [place], [address] Phone: [phone]   Fax:  [phone]  Physical Therapy Treatment  Patient Details  Name: Nicholas Delacruz MRN: [id number] Date of Birth: 05-Feb-1966 Referring Provider: Teryl Lucy  Encounter Date: 11/02/2016      PT End of Session - 11/02/16 1525    Visit Number 11   Number of Visits 18   Date for PT Re-Evaluation 11/14/16   Authorization Type [employer]    PT Start Time 1520  restroom break initial session   PT Stop Time 1602   PT Time Calculation (min) 42 min   Activity Tolerance Patient tolerated treatment well   Behavior During Therapy [facility] for tasks assessed/performed      Past Medical History:  Diagnosis Date  . Diabetes mellitus without complication ([facility])   . GERD (gastroesophageal reflux disease)   . High blood pressure   . Obstructive sleep apnea on CPAP   . PONV (postoperative nausea and vomiting)    blood pressure dropped in recovery    Past Surgical History:  Procedure Laterality Date  . APPENDECTOMY    . KNEE ARTHROSCOPY Right 01/02/2015   Procedure: RIGHT KNEE ARTHROSCOPY, LATERAL AND MEDIAL MENISECTOMY;  Surgeon: Vickki Hearing, MD;  Location: [facility];  Service: Orthopedics;  Laterality: Right;  . KNEE ARTHROSCOPY WITH MEDIAL MENISECTOMY Left 06/25/2014   Procedure: KNEE ARTHROSCOPY WITH MEDIAL MENISECTOMY;  Surgeon: Vickki Hearing, MD;  Location: [facility];  Service: Orthopedics;  Laterality: Left;  . KNEE SURGERY Left   . MENISCUS DEBRIDEMENT Right 01/02/2015   Procedure: DEBRIDEMENT OF RIGHT KNEE JOINT;  Surgeon: Vickki Hearing, MD;  Location: [facility];  Service: Orthopedics;  Laterality: Right;  . right knee sark    . SHOULDER SURGERY     left  . TOTAL KNEE ARTHROPLASTY Right 08/30/2016   Procedure: TOTAL KNEE ARTHROPLASTY;  Surgeon: Teryl Lucy, MD;  Location[facility];  Service: Orthopedics;  Laterality: Right;    There were no vitals filed  for this visit.      Subjective Assessment - 11/02/16 1522    Subjective Pt stated he was practicing his heel to toe gait and got sharp pull on anterior thigh with increased pain anterior knee for 2 days.  Stated he woke up this morning wiht no pain.  Current pain scale 3-4/10 medial and lateral Rt knee, mainly tightness and achey   Pertinent History no longer taking xarelto, HTN, L LE weakness, DM, OSA, GERD, extensive B knee surgical history    Patient Stated Goals return to PLOF    Currently in Pain? Yes   Pain Score 4    Pain Location Knee   Pain Orientation Right;Medial;Lateral   Pain Descriptors / Indicators Tightness;Aching   Pain Type Surgical pain   Pain Radiating Towards none   Pain Onset More than a month ago   Pain Frequency Constant   Aggravating Factors  bending it   Pain Relieving Factors ice   Effect of Pain on Daily Activities cannot perform PLOF             [facility] Adult PT Treatment/Exercise - 11/02/16 0001      Ambulation/Gait   Ambulation Distance (Feet) 226 Feet   Assistive device None   Gait Comments cueing to equalize stance phase with gait and posture; improved heel to toe pattern noted     Knee/Hip Exercises: Stretches   Active Hamstring Stretch Right;3 reps;30 seconds   Active  Hamstring Stretch Limitations supine with rope   Quad Stretch 3 reps;30 seconds   Quad Stretch Limitations prone with rope   Knee: Self-Stretch to increase Flexion Right   Knee: Self-Stretch Limitations 15 reps, 10 seconds, 12 inch step    Gastroc Stretch Both;3 reps;30 seconds   Gastroc Stretch Limitations slantboard     Knee/Hip Exercises: Aerobic   Nustep 5 min initial session      Knee/Hip Exercises: Standing   Heel Raises 15 reps   Heel Raises Limitations Toe raises   Rocker Board 2 minutes   Rocker Board Limitations R/L and A/P     Knee/Hip Exercises: Supine   Quad Sets Right;1 set;15 reps   [employer] Limitations 3' holds   Short Arc [employer] 15 reps    Short Arc Quad Sets Limitations 5" holds   Heel Slides 15 reps   Heel Slides Limitations AAROM with rope     Manual Therapy   Manual Therapy Myofascial release;Joint mobilization;Passive ROM   Manual therapy comments performed separately from all other skilled treatments    Edema Management Retro massage wtih LE elevated   Joint Mobilization patella mobilization all directions; knee flexion tib on femur gfrade III x3 rounds    Soft tissue mobilization distal quadriceps   Myofascial Release scar tissue massage                  PT Short Term Goals - 10/24/16 1547      PT SHORT TERM GOAL #1   Title Patient to demonstrate R knee ROM 0-100 degrees in order to improve mechanics and general gait pattern    Baseline 10/30- 9 to 76   Time 3   Period Weeks   Status On-going     PT SHORT TERM GOAL #2   Title Patient to be able to ambulate unlimited distances with no assistive device, R knee pain no more than 2/10, in order to improve general mobility    Baseline 10/30- walking without device, R knee pain 3-4/10   Time 3   Period Weeks   Status Partially Met     PT SHORT TERM GOAL #3   Title Patient to be able to ambulate at least 557f during 3MWT in order to show improved mobility and facilitate return to PLOF    Baseline 10/30- 5219f   Time 3   Period Weeks   Status On-going     PT SHORT TERM GOAL #4   Title Patient to be independent in correctly and consistently performing appropraite HEP, to be updated PRN    Baseline 10/30- reports compliance    Time 1   Period Weeks   Status Achieved           PT Long Term Goals - 10/24/16 1548      PT LONG TERM GOAL #1   Title Patient to demonstrate ROM R knee 0-115 degrees in order to improve general gait mechanics and improve general mobility    Baseline 10/30- 9-76   Time 6   Period Weeks   Status On-going     PT LONG TERM GOAL #2   Title Patient to be able to maintain SLS each LE for at least 30 seconds B in order  to show improved balance skills and reduced fall risk    Baseline 10/30- 10-20 seconds    Time 6   Period Weeks   Status On-going     PT LONG TERM GOAL #3   Title Patient  to be able to ambulate without difficulty over grass and gravel with no assistive device, minimal fall risk, in order to assist in return to PLOF    Baseline 10/30- reports succcess but this is more difficult    Time 6   Period Weeks   Status On-going     PT LONG TERM GOAL #4   Title Patient to be able to reciprocally ascend/descend full flight of stairs, U railing, reciprocal pattern, minimal unsteadiness, good eccentric control, in order to improve mobilty at home and within community    Time 6   Period Weeks   Status On-going               Plan - 11/02/16 1609    Clinical Impression Statement Continued session focus on addressing ROM and gait training.  Began session on nustep for knee mobility followed by manual technques to address edema and scar tissue adhesions.  Pt does continue to present with excessive scar adhesions around knee as well as distal quadriceps.  Continued therex focus on functional stretches and ROM based exercises.  Pt is making progress with AROM at 5-82 degrees as well as improved gait mechanics with improved heel to toe pattern.  Pt does continue to require cueing to improve posture with gait and equalize stance phase.     Rehab Potential Good   Clinical Impairments Affecting Rehab Potential history of multiple knee injuries/surgeries; history of knee rehabilitation   PT Frequency 3x / week   PT Duration 6 weeks   PT Treatment/Interventions ADLs/Self Care Home Management;Biofeedback;Cryotherapy;Moist Heat;DME Instruction;Gait training;Stair training;Functional mobility training;Therapeutic activities;Therapeutic exercise;Balance training;Neuromuscular re-education;Patient/family education;Manual techniques;Scar mobilization;Passive range of motion;Energy conservation;Taping   PT Next  Visit Plan Start on Nustep for limbering knee for ROM, then focus on ROM and gait.  Next session give printout to [employer] compression hose as forgot printout this session.        Patient will benefit from skilled therapeutic intervention in order to improve the following deficits and impairments:  Abnormal gait, Decreased skin integrity, Increased fascial restricitons, Improper body mechanics, Pain, Decreased mobility, Decreased coordination, Postural dysfunction, Decreased range of motion, Decreased strength, Hypomobility, Decreased balance, Difficulty walking, Increased edema, Impaired flexibility  Visit Diagnosis: Stiffness of right knee, not elsewhere classified  Localized edema  Difficulty in walking, not elsewhere classified  Muscle weakness (generalized)     Problem List Patient Active Problem List   Diagnosis Date Noted  . S/P total knee arthroplasty 08/30/2016  . Weakness of left leg 03/13/2014  . Difficulty in walking(719.7) 03/13/2014  . Arthritis of knee, left 01/21/2014  . Cervicalgia 11/08/2011  . RHEUMATOID ARTHRITIS 02/01/2011  . RUPTURE ROTATOR CUFF 02/01/2011  . IMPINGEMENT SYNDROME 12/07/2010  . Primary localized osteoarthritis of right knee 09/28/2009  . Essential hypertension 05/13/2009   Ihor Austin, [place]; [place]  Aldona Lento 11/02/2016, 4:18 PM  [facility], [place], [address] Phone: [phone]   Fax:  [phone]  Name: Nicholas Delacruz MRN: [id number] Date of Birth: [birth year]

## 2016-11-07 ENCOUNTER — Ambulatory Visit (HOSPITAL_COMMUNITY): Payer: 59 | Admitting: Physical Therapy

## 2016-11-07 DIAGNOSIS — R262 Difficulty in walking, not elsewhere classified: Secondary | ICD-10-CM

## 2016-11-07 DIAGNOSIS — M25661 Stiffness of right knee, not elsewhere classified: Secondary | ICD-10-CM

## 2016-11-07 DIAGNOSIS — R600 Localized edema: Secondary | ICD-10-CM

## 2016-11-07 DIAGNOSIS — M6281 Muscle weakness (generalized): Secondary | ICD-10-CM

## 2016-11-07 NOTE — Therapy (Signed)
[place], [place], [address] Phone: [phone]   Fax:  [phone]  Physical Therapy Treatment  Patient Details  Name: Nicholas Delacruz MRN: [id number] Date of Birth: 02/14/1966 Referring Provider: Marchia Bond  Encounter Date: 11/07/2016      PT End of Session - 11/07/16 1630    Visit Number 12   Number of Visits 18   Date for PT Re-Evaluation 11/14/16   Authorization Type [employer]    PT Start Time 1525  unbilled time on [place]    PT Stop Time 1557   PT Time Calculation (min) 32 min   Activity Tolerance Patient tolerated treatment well   Behavior During Therapy [facility] for tasks assessed/performed      Past Medical History:  Diagnosis Date  . Diabetes mellitus without complication ([place])   . GERD (gastroesophageal reflux disease)   . High blood pressure   . Obstructive sleep apnea on CPAP   . PONV (postoperative nausea and vomiting)    blood pressure dropped in recovery    Past Surgical History:  Procedure Laterality Date  . APPENDECTOMY    . KNEE ARTHROSCOPY Right 01/02/2015   Procedure: RIGHT KNEE ARTHROSCOPY, LATERAL AND MEDIAL MENISECTOMY;  Surgeon: Carole Civil, MD;  [facility];  Service: Orthopedics;  Laterality: Right;  . KNEE ARTHROSCOPY WITH MEDIAL MENISECTOMY Left 06/25/2014   Procedure: KNEE ARTHROSCOPY WITH MEDIAL MENISECTOMY;  Surgeon: Carole Civil, MD;  Location[facility];  Service: Orthopedics;  Laterality: Left;  . KNEE SURGERY Left   . MENISCUS DEBRIDEMENT Right 01/02/2015   Procedure: DEBRIDEMENT OF RIGHT KNEE JOINT;  Surgeon: Carole Civil, MD;  Location[facility];  Service: Orthopedics;  Laterality: Right;  . right knee sark    . SHOULDER SURGERY     left  . TOTAL KNEE ARTHROPLASTY Right 08/30/2016   Procedure: TOTAL KNEE ARTHROPLASTY;  Surgeon: Marchia Bond, MD;  Location: [place];  Service: Orthopedics;  Laterality: Right;    There were no vitals filed for  this visit.      Subjective Assessment - 11/07/16 1528    Subjective Patient arrives today stating that he is doing well, he saw his MD this morning who is going to continue to just watch the knee and will see him in 4 weeks as well, they are not going to do a manipulation for now.    Pertinent History no longer taking xarelto, HTN, L LE weakness, DM, OSA, GERD, extensive B knee surgical history    Patient Stated Goals return to PLOF    Currently in Pain? Yes   Pain Score 4    Pain Location Knee   Pain Orientation Right            [facility] PT Assessment - 11/07/16 0001      AROM   Right Knee Extension 2   Right Knee Flexion 80                     [facility] Adult PT Treatment/Exercise - 11/07/16 0001      Knee/Hip Exercises: Standing   Heel Raises 20 reps   Heel Raises Limitations toe raises and heel raises    Rocker Board 2 minutes   Rocker Board Limitations R/L and A/P   Other Standing Knee Exercises ham curls off of 8 inch box 2x10     Knee/Hip Exercises: Seated   Knee/Hip Flexion knee extension/flexion with ball 1x10  Knee/Hip Exercises: Supine   Quad Sets Right;1 set;20 reps   Quad Sets Limitations 5 second holds    Heel Slides 20 reps   Heel Slides Limitations 5 second holds   Other Supine Knee/Hip Exercises overpressure in supine and prone for knee ROM                 PT Education - 11/07/16 1630    Education provided No          PT Short Term Goals - 10/24/16 1547      PT SHORT TERM GOAL #1   Title Patient to demonstrate R knee ROM 0-100 degrees in order to improve mechanics and general gait pattern    Baseline 10/30- 9 to 76   Time 3   Period Weeks   Status On-going     PT SHORT TERM GOAL #2   Title Patient to be able to ambulate unlimited distances with no assistive device, R knee pain no more than 2/10, in order to improve general mobility    Baseline 10/30- walking without device, R knee pain 3-4/10   Time 3   Period Weeks    Status Partially Met     PT SHORT TERM GOAL #3   Title Patient to be able to ambulate at least 583f during 3MWT in order to show improved mobility and facilitate return to PLOF    Baseline 10/30- 52106f   Time 3   Period Weeks   Status On-going     PT SHORT TERM GOAL #4   Title Patient to be independent in correctly and consistently performing appropraite HEP, to be updated PRN    Baseline 10/30- reports compliance    Time 1   Period Weeks   Status Achieved           PT Long Term Goals - 10/24/16 1548      PT LONG TERM GOAL #1   Title Patient to demonstrate ROM R knee 0-115 degrees in order to improve general gait mechanics and improve general mobility    Baseline 10/30- 9-76   Time 6   Period Weeks   Status On-going     PT LONG TERM GOAL #2   Title Patient to be able to maintain SLS each LE for at least 30 seconds B in order to show improved balance skills and reduced fall risk    Baseline 10/30- 10-20 seconds    Time 6   Period Weeks   Status On-going     PT LONG TERM GOAL #3   Title Patient to be able to ambulate without difficulty over grass and gravel with no assistive device, minimal fall risk, in order to assist in return to PLOF    Baseline 10/30- reports succcess but this is more difficult    Time 6   Period Weeks   Status On-going     PT LONG TERM GOAL #4   Title Patient to be able to reciprocally ascend/descend full flight of stairs, U railing, reciprocal pattern, minimal unsteadiness, good eccentric control, in order to improve mobilty at home and within community    Time 6   Period Weeks   Status On-going               Plan - 11/07/16 1631    Clinical Impression Statement Began session on Nustep, not included in billing, per patient request; continued to focus on ROM based activities today due to ongoing knee stiffness, able to reach 2 degrees  extension but continue to be limited to approximately 80 degrees flexion. Continued working on  Publishing rights manager as well as pre-gait actiivites such as Diplomatic Services operational officer and basic strength/knee ROM activities.    Rehab Potential Good   Clinical Impairments Affecting Rehab Potential history of multiple knee injuries/surgeries; history of knee rehabilitation   PT Frequency 3x / week   PT Duration 6 weeks   PT Treatment/Interventions ADLs/Self Care Home Management;Biofeedback;Cryotherapy;Moist Heat;DME Instruction;Gait training;Stair training;Functional mobility training;Therapeutic activities;Therapeutic exercise;Balance training;Neuromuscular re-education;Patient/family education;Manual techniques;Scar mobilization;Passive range of motion;Energy conservation;Taping   PT Next Visit Plan [place] Biodex for ROM. Start on Nustep for limbering knee for ROM, then focus on ROM and gait.  Next session give printout to [place] compression hose as forgot printout this session.     PT Home Exercise Plan 10/9: quad sets and knee flexion stretch    Consulted and Agree with Plan of Care Patient      Patient will benefit from skilled therapeutic intervention in order to improve the following deficits and impairments:  Abnormal gait, Decreased skin integrity, Increased fascial restricitons, Improper body mechanics, Pain, Decreased mobility, Decreased coordination, Postural dysfunction, Decreased range of motion, Decreased strength, Hypomobility, Decreased balance, Difficulty walking, Increased edema, Impaired flexibility  Visit Diagnosis: Stiffness of right knee, not elsewhere classified  Localized edema  Difficulty in walking, not elsewhere classified  Muscle weakness (generalized)     Problem List Patient Active Problem List   Diagnosis Date Noted  . S/P total knee arthroplasty 08/30/2016  . Weakness of left leg 03/13/2014  . Difficulty in walking(719.7) 03/13/2014  . Arthritis of knee, left 01/21/2014  . Cervicalgia 11/08/2011  . RHEUMATOID ARTHRITIS 02/01/2011  . RUPTURE ROTATOR CUFF  02/01/2011  . IMPINGEMENT SYNDROME 12/07/2010  . Primary localized osteoarthritis of right knee 09/28/2009  . Essential hypertension 05/13/2009    Deniece Ree PT, DPT [facility] [address] [place], [place], [address] Phone: [phone]   Fax:  [phone]  Name: Nicholas Delacruz MRN: [id number] Date of Birth: 06-23-1966

## 2016-11-09 ENCOUNTER — Ambulatory Visit (HOSPITAL_COMMUNITY): Payer: 59

## 2016-11-09 DIAGNOSIS — M25661 Stiffness of right knee, not elsewhere classified: Secondary | ICD-10-CM | POA: Diagnosis not present

## 2016-11-09 DIAGNOSIS — R600 Localized edema: Secondary | ICD-10-CM

## 2016-11-09 DIAGNOSIS — M6281 Muscle weakness (generalized): Secondary | ICD-10-CM

## 2016-11-09 DIAGNOSIS — R262 Difficulty in walking, not elsewhere classified: Secondary | ICD-10-CM

## 2016-11-09 NOTE — Therapy (Signed)
Nicholas Delacruz, [place], [address] Phone: [phone]   Fax:  [phone]  Physical Therapy Treatment  Patient Details  Name: Nicholas Delacruz MRN: [id number] Date of Birth: 1966/10/14 Referring Provider: Marchia Bond  Encounter Date: 11/09/2016      PT End of Session - 11/09/16 1611    Visit Number 13   Number of Visits 18   Date for PT Re-Evaluation 11/14/16   Authorization Type [employer]    PT Start Time 1600   PT Stop Time 1644   PT Time Calculation (min) 44 min   Activity Tolerance Patient tolerated treatment well   Behavior During Therapy [facility] for tasks assessed/performed      Past Medical History:  Diagnosis Date  . Diabetes mellitus without complication ([name])   . GERD (gastroesophageal reflux disease)   . High blood pressure   . Obstructive sleep apnea on CPAP   . PONV (postoperative nausea and vomiting)    blood pressure dropped in recovery    Past Surgical History:  Procedure Laterality Date  . APPENDECTOMY    . KNEE ARTHROSCOPY Right 01/02/2015   Procedure: RIGHT KNEE ARTHROSCOPY, LATERAL AND MEDIAL MENISECTOMY;  Surgeon: Carole Civil, MD;  [facility];  Service: Orthopedics;  Laterality: Right;  . KNEE ARTHROSCOPY WITH MEDIAL MENISECTOMY Left 06/25/2014   Procedure: KNEE ARTHROSCOPY WITH MEDIAL MENISECTOMY;  Surgeon: Carole Civil, MD;  [facility];  Service: Orthopedics;  Laterality: Left;  . KNEE SURGERY Left   . MENISCUS DEBRIDEMENT Right 01/02/2015   Procedure: DEBRIDEMENT OF RIGHT KNEE JOINT;  Surgeon: Carole Civil, MD;  [facility];  Service: Orthopedics;  Laterality: Right;  . right knee sark    . SHOULDER SURGERY     left  . TOTAL KNEE ARTHROPLASTY Right 08/30/2016   Procedure: TOTAL KNEE ARTHROPLASTY;  Surgeon: Marchia Bond, MD;  Location: [place];  Service: Orthopedics;  Laterality: Right;    There were no vitals filed for this visit.       Subjective Assessment - 11/09/16 1607    Subjective Pt stated knee pain scale 4/10 on medial aspect of knee feels achey and stiff today.     Pertinent History no longer taking xarelto, HTN, L LE weakness, DM, OSA, GERD, extensive B knee surgical history    Patient Stated Goals return to PLOF    Currently in Pain? Yes   Pain Score 4    Pain Location Knee   Pain Orientation Right;Medial   Pain Descriptors / Indicators Tightness;Aching  stiffness   Pain Type Surgical pain   Pain Radiating Towards none   Pain Onset More than a month ago   Pain Frequency Constant   Aggravating Factors  bending it   Pain Relieving Factors ice   Effect of Pain on Daily Activities cannot perform PLOF           [facility] Adult PT Treatment/Exercise - 11/09/16 0001      Knee/Hip Exercises: Stretches   Sports administrator 3 reps;30 seconds   Quad Stretch Limitations prone with rope   Knee: Self-Stretch to increase Flexion Right   Knee: Self-Stretch Limitations 15 reps, 10 seconds, 12 inch step      Knee/Hip Exercises: Aerobic   Nustep 5 min initial session no charge   Other Aerobic Biodex x 71mn PROM per tolerance     Knee/Hip Exercises: Supine   Heel Slides 20 reps   Heel Slides Limitations 5 second holds  Manual Therapy   Manual Therapy Myofascial release;Joint mobilization;Passive ROM   Manual therapy comments performed separately from all other skilled treatments    Edema Management Retro massage wtih LE elevated   Joint Mobilization patella mobilization all directions; knee flexion tib on femur gfrade III x3 rounds    Soft tissue mobilization distal quadriceps   Myofascial Release scar tissue massage                  PT Short Term Goals - 10/24/16 1547      PT SHORT TERM GOAL #1   Title Patient to demonstrate R knee ROM 0-100 degrees in order to improve mechanics and general gait pattern    Baseline 10/30- 9 to 76   Time 3   Period Weeks   Status On-going     PT SHORT TERM GOAL  #2   Title Patient to be able to ambulate unlimited distances with no assistive device, R knee pain no more than 2/10, in order to improve general mobility    Baseline 10/30- walking without device, R knee pain 3-4/10   Time 3   Period Weeks   Status Partially Met     PT SHORT TERM GOAL #3   Title Patient to be able to ambulate at least 58f during 3MWT in order to show improved mobility and facilitate return to PLOF    Baseline 10/30- 5256f   Time 3   Period Weeks   Status On-going     PT SHORT TERM GOAL #4   Title Patient to be independent in correctly and consistently performing appropraite HEP, to be updated PRN    Baseline 10/30- reports compliance    Time 1   Period Weeks   Status Achieved           PT Long Term Goals - 10/24/16 1548      PT LONG TERM GOAL #1   Title Patient to demonstrate ROM R knee 0-115 degrees in order to improve general gait mechanics and improve general mobility    Baseline 10/30- 9-76   Time 6   Period Weeks   Status On-going     PT LONG TERM GOAL #2   Title Patient to be able to maintain SLS each LE for at least 30 seconds B in order to show improved balance skills and reduced fall risk    Baseline 10/30- 10-20 seconds    Time 6   Period Weeks   Status On-going     PT LONG TERM GOAL #3   Title Patient to be able to ambulate without difficulty over grass and gravel with no assistive device, minimal fall risk, in order to assist in return to PLOF    Baseline 10/30- reports succcess but this is more difficult    Time 6   Period Weeks   Status On-going     PT LONG TERM GOAL #4   Title Patient to be able to reciprocally ascend/descend full flight of stairs, U railing, reciprocal pattern, minimal unsteadiness, good eccentric control, in order to improve mobilty at home and within community    Time 6   Period Weeks   Status On-going               Plan - 11/09/16 1618    Clinical Impression Statement Continued session focus on  improving knee mobilty.  Began session on Nustep as a warm up to assist with knee stiffness.  Began biodex PROM to improve flexion with positive  results as pt stated he felt good stretch on quadriceps.  Pt does continue to present with significant amount of scar tissue adhesions and edema limiting ROM.  Manual techniques were complete to reduce myofascial restrictions and edema control.  Pt given printout for compression stockings in [place] to assist wtih edema control.  EOS AROM following manual at 84 degrees flexion.     Rehab Potential Good   Clinical Impairments Affecting Rehab Potential history of multiple knee injuries/surgeries; history of knee rehabilitation   PT Frequency 3x / week   PT Duration 6 weeks   PT Treatment/Interventions ADLs/Self Care Home Management;Biofeedback;Cryotherapy;Moist Heat;DME Instruction;Gait training;Stair training;Functional mobility training;Therapeutic activities;Therapeutic exercise;Balance training;Neuromuscular re-education;Patient/family education;Manual techniques;Scar mobilization;Passive range of motion;Energy conservation;Taping   PT Next Visit Plan Continue with Biodex for ROM. Start on Nustep for limbering knee for ROM, then focus on ROM and gait.  F/U with ability to purchase compression hose from [place] vs. addition referal needed for hose.        Patient will benefit from skilled therapeutic intervention in order to improve the following deficits and impairments:  Abnormal gait, Decreased skin integrity, Increased fascial restricitons, Improper body mechanics, Pain, Decreased mobility, Decreased coordination, Postural dysfunction, Decreased range of motion, Decreased strength, Hypomobility, Decreased balance, Difficulty walking, Increased edema, Impaired flexibility  Visit Diagnosis: Stiffness of right knee, not elsewhere classified  Localized edema  Difficulty in walking, not elsewhere classified  Muscle weakness  (generalized)     Problem List Patient Active Problem List   Diagnosis Date Noted  . S/P total knee arthroplasty 08/30/2016  . Weakness of left leg 03/13/2014  . Difficulty in walking(719.7) 03/13/2014  . Arthritis of knee, left 01/21/2014  . Cervicalgia 11/08/2011  . RHEUMATOID ARTHRITIS 02/01/2011  . RUPTURE ROTATOR CUFF 02/01/2011  . IMPINGEMENT SYNDROME 12/07/2010  . Primary localized osteoarthritis of right knee 09/28/2009  . Essential hypertension 05/13/2009   Ihor Austin, [place]; [place]  Aldona Lento 11/09/2016, 4:56 PM  [facility] [address] [place], [place], [address] Phone: [phone]   Fax:  [phone]  Name: TABER SWEETSER MRN: [id number] Date of Birth: [birth year]

## 2016-11-11 ENCOUNTER — Ambulatory Visit (HOSPITAL_COMMUNITY): Payer: 59 | Admitting: Physical Therapy

## 2016-11-15 ENCOUNTER — Ambulatory Visit (HOSPITAL_COMMUNITY): Payer: 59

## 2016-11-15 DIAGNOSIS — M25661 Stiffness of right knee, not elsewhere classified: Secondary | ICD-10-CM

## 2016-11-15 DIAGNOSIS — M6281 Muscle weakness (generalized): Secondary | ICD-10-CM

## 2016-11-15 DIAGNOSIS — R600 Localized edema: Secondary | ICD-10-CM

## 2016-11-15 DIAGNOSIS — R262 Difficulty in walking, not elsewhere classified: Secondary | ICD-10-CM

## 2016-11-15 NOTE — Therapy (Signed)
[place], [place], [address] Phone: [phone]   Fax:  [phone]  Physical Therapy Treatment  Patient Details  Name: Nicholas Delacruz MRN: [id number] Date of Birth: 02-Oct-1966 Referring Provider: Marchia Bond  Encounter Date: 11/15/2016      PT End of Session - 11/15/16 1446    Visit Number 14   Number of Visits 18   Date for PT Re-Evaluation 11/14/16   Authorization Type [employer]    PT Start Time 1435   PT Stop Time 1517   PT Time Calculation (min) 42 min   Activity Tolerance Patient tolerated treatment well   Behavior During Therapy [facility] for tasks assessed/performed      Past Medical History:  Diagnosis Date  . Diabetes mellitus without complication ([place])   . GERD (gastroesophageal reflux disease)   . High blood pressure   . Obstructive sleep apnea on CPAP   . PONV (postoperative nausea and vomiting)    blood pressure dropped in recovery    Past Surgical History:  Procedure Laterality Date  . APPENDECTOMY    . KNEE ARTHROSCOPY Right 01/02/2015   Procedure: RIGHT KNEE ARTHROSCOPY, LATERAL AND MEDIAL MENISECTOMY;  Surgeon: Carole Civil, MD;  [facility];  Service: Orthopedics;  Laterality: Right;  . KNEE ARTHROSCOPY WITH MEDIAL MENISECTOMY Left 06/25/2014   Procedure: KNEE ARTHROSCOPY WITH MEDIAL MENISECTOMY;  Surgeon: Carole Civil, MD;  [facility];  Service: Orthopedics;  Laterality: Left;  . KNEE SURGERY Left   . MENISCUS DEBRIDEMENT Right 01/02/2015   Procedure: DEBRIDEMENT OF RIGHT KNEE JOINT;  Surgeon: Carole Civil, MD;  [facility];  Service: Orthopedics;  Laterality: Right;  . right knee sark    . SHOULDER SURGERY     left  . TOTAL KNEE ARTHROPLASTY Right 08/30/2016   Procedure: TOTAL KNEE ARTHROPLASTY;  Surgeon: Marchia Bond, MD;  Location: [place];  Service: Orthopedics;  Laterality: Right;    There were no vitals filed for this visit.       Subjective Assessment - 11/15/16 1438    Subjective Pt stated knee stiff today on lateral aspect of Rt knee current pain scale 3/10.     Pertinent History no longer taking xarelto, HTN, L LE weakness, DM, OSA, GERD, extensive B knee surgical history    Patient Stated Goals return to PLOF    Currently in Pain? Yes   Pain Score 3    Pain Location Knee   Pain Orientation Right;Medial   Pain Descriptors / Indicators Tightness;Aching  Stiffness on lateral aspect of knee   Pain Type Surgical pain   Pain Radiating Towards none   Pain Onset More than a month ago   Pain Frequency Constant   Aggravating Factors  bending it   Pain Relieving Factors ice   Effect of Pain on Daily Activities cannot perform PLOF              [facility] Adult PT Treatment/Exercise - 11/15/16 0001      Knee/Hip Exercises: Stretches   Sports administrator 3 reps;30 seconds   Quad Stretch Limitations prone with rope   Knee: Self-Stretch to increase Flexion Right   Knee: Self-Stretch Limitations 15 reps, 10 seconds, 12 inch step      Knee/Hip Exercises: Aerobic   Other Aerobic Biodex x 45mn PROM per tolerance     Knee/Hip Exercises: Seated   Heel Slides 10 reps   Heel Slides Limitations AAROM  Knee/Hip Exercises: Supine   Heel Slides 2 sets;10 reps   Heel Slides Limitations AAROM with rope     Manual Therapy   Manual Therapy Myofascial release;Joint mobilization;Passive ROM   Manual therapy comments performed separately from all other skilled treatments    Edema Management Retro massage wtih LE elevated   Joint Mobilization patella mobilization all directions; knee flexion tib on femur gfrade III x3 rounds    Soft tissue mobilization distal quadriceps   Myofascial Release scar tissue massage   Passive ROM Flexion in seated, supine with AA row and prone quad st                  PT Short Term Goals - 10/24/16 1547      PT SHORT TERM GOAL #1   Title Patient to demonstrate R knee ROM 0-100  degrees in order to improve mechanics and general gait pattern    Baseline 10/30- 9 to 76   Time 3   Period Weeks   Status On-going     PT SHORT TERM GOAL #2   Title Patient to be able to ambulate unlimited distances with no assistive device, R knee pain no more than 2/10, in order to improve general mobility    Baseline 10/30- walking without device, R knee pain 3-4/10   Time 3   Period Weeks   Status Partially Met     PT SHORT TERM GOAL #3   Title Patient to be able to ambulate at least 572f during 3MWT in order to show improved mobility and facilitate return to PLOF    Baseline 10/30- 5223f   Time 3   Period Weeks   Status On-going     PT SHORT TERM GOAL #4   Title Patient to be independent in correctly and consistently performing appropraite HEP, to be updated PRN    Baseline 10/30- reports compliance    Time 1   Period Weeks   Status Achieved           PT Long Term Goals - 10/24/16 1548      PT LONG TERM GOAL #1   Title Patient to demonstrate ROM R knee 0-115 degrees in order to improve general gait mechanics and improve general mobility    Baseline 10/30- 9-76   Time 6   Period Weeks   Status On-going     PT LONG TERM GOAL #2   Title Patient to be able to maintain SLS each LE for at least 30 seconds B in order to show improved balance skills and reduced fall risk    Baseline 10/30- 10-20 seconds    Time 6   Period Weeks   Status On-going     PT LONG TERM GOAL #3   Title Patient to be able to ambulate without difficulty over grass and gravel with no assistive device, minimal fall risk, in order to assist in return to PLOF    Baseline 10/30- reports succcess but this is more difficult    Time 6   Period Weeks   Status On-going     PT LONG TERM GOAL #4   Title Patient to be able to reciprocally ascend/descend full flight of stairs, U railing, reciprocal pattern, minimal unsteadiness, good eccentric control, in order to improve mobilty at home and within  community    Time 6   Period Weeks   Status On-going               Plan - 11/15/16  1649    Clinical Impression Statement Session focus on improving knee mobility.  Pt reports he has been riding bike at home so did not begin this session on Nustep/bike.  Utilized active stretches, manual techniques and biodex PROM to improve knee flexion.  EOS improved AROM to 85-86 degrees flexion.   No reports of increased pain at EOS.     Rehab Potential Good   Clinical Impairments Affecting Rehab Potential history of multiple knee injuries/surgeries; history of knee rehabilitation   PT Frequency 3x / week   PT Duration 6 weeks   PT Next Visit Plan Continue with Biodex for ROM. Start on Nustep for limbering knee for ROM, then focus on ROM and gait.  F/U with ability to purchase compression hose from [place] vs. addition referal needed for hose.        Patient will benefit from skilled therapeutic intervention in order to improve the following deficits and impairments:  Abnormal gait, Decreased skin integrity, Increased fascial restricitons, Improper body mechanics, Pain, Decreased mobility, Decreased coordination, Postural dysfunction, Decreased range of motion, Decreased strength, Hypomobility, Decreased balance, Difficulty walking, Increased edema, Impaired flexibility  Visit Diagnosis: Stiffness of right knee, not elsewhere classified  Localized edema  Difficulty in walking, not elsewhere classified  Muscle weakness (generalized)     Problem List Patient Active Problem List   Diagnosis Date Noted  . S/P total knee arthroplasty 08/30/2016  . Weakness of left leg 03/13/2014  . Difficulty in walking(719.7) 03/13/2014  . Arthritis of knee, left 01/21/2014  . Cervicalgia 11/08/2011  . RHEUMATOID ARTHRITIS 02/01/2011  . RUPTURE ROTATOR CUFF 02/01/2011  . IMPINGEMENT SYNDROME 12/07/2010  . Primary localized osteoarthritis of right knee 09/28/2009  . Essential hypertension 05/13/2009    Ihor Austin, [place]; [place]  Aldona Lento 11/15/2016, 4:55 PM  [facility] [address] [place], [place], [address] Phone: [phone]   Fax:  [phone]  Name: Nicholas Delacruz MRN: [id number] Date of Birth: 1966/03/30

## 2016-11-21 ENCOUNTER — Telehealth (HOSPITAL_COMMUNITY): Payer: Self-pay | Admitting: Physical Therapy

## 2016-11-21 ENCOUNTER — Ambulatory Visit (HOSPITAL_COMMUNITY): Payer: 59 | Admitting: Physical Therapy

## 2016-11-21 NOTE — Telephone Encounter (Signed)
Patient states he is sick on the stomach today and can not come int

## 2016-11-23 ENCOUNTER — Ambulatory Visit (HOSPITAL_COMMUNITY): Payer: 59 | Admitting: Physical Therapy

## 2016-11-23 ENCOUNTER — Telehealth (HOSPITAL_COMMUNITY): Payer: Self-pay | Admitting: Physical Therapy

## 2016-11-23 NOTE — Telephone Encounter (Signed)
He is still sick

## 2016-11-24 ENCOUNTER — Telehealth (HOSPITAL_COMMUNITY): Payer: Self-pay | Admitting: Family Medicine

## 2016-11-24 NOTE — Telephone Encounter (Signed)
11/24/16 pt cx his 12/1 appt because he is sick and has been all week

## 2016-11-25 ENCOUNTER — Ambulatory Visit (HOSPITAL_COMMUNITY): Payer: 59 | Admitting: Physical Therapy

## 2016-11-28 ENCOUNTER — Telehealth (HOSPITAL_COMMUNITY): Payer: Self-pay | Admitting: Physical Therapy

## 2016-11-28 ENCOUNTER — Ambulatory Visit (HOSPITAL_COMMUNITY): Payer: 59 | Admitting: Physical Therapy

## 2016-11-28 NOTE — Telephone Encounter (Signed)
he is still sick and has a MD apptment today at 1;30, hopes he can make this Friday apptment.

## 2016-11-29 ENCOUNTER — Encounter (HOSPITAL_COMMUNITY): Payer: 59 | Admitting: Physical Therapy

## 2016-11-30 ENCOUNTER — Encounter (HOSPITAL_COMMUNITY): Payer: 59

## 2016-12-01 ENCOUNTER — Encounter (HOSPITAL_COMMUNITY): Payer: 59

## 2016-12-02 ENCOUNTER — Ambulatory Visit (HOSPITAL_COMMUNITY): Payer: 59 | Admitting: Physical Therapy

## 2016-12-02 ENCOUNTER — Telehealth (HOSPITAL_COMMUNITY): Payer: Self-pay | Admitting: Physical Therapy

## 2016-12-02 NOTE — Telephone Encounter (Signed)
He is still sick and can not come out

## 2016-12-05 ENCOUNTER — Telehealth (HOSPITAL_COMMUNITY): Payer: Self-pay | Admitting: Physical Therapy

## 2016-12-05 ENCOUNTER — Ambulatory Visit (HOSPITAL_COMMUNITY): Payer: 59 | Attending: Orthopedic Surgery | Admitting: Physical Therapy

## 2016-12-05 DIAGNOSIS — M25661 Stiffness of right knee, not elsewhere classified: Secondary | ICD-10-CM | POA: Insufficient documentation

## 2016-12-05 DIAGNOSIS — M6281 Muscle weakness (generalized): Secondary | ICD-10-CM | POA: Insufficient documentation

## 2016-12-05 DIAGNOSIS — R600 Localized edema: Secondary | ICD-10-CM | POA: Insufficient documentation

## 2016-12-05 DIAGNOSIS — R262 Difficulty in walking, not elsewhere classified: Secondary | ICD-10-CM | POA: Insufficient documentation

## 2016-12-05 NOTE — Telephone Encounter (Signed)
No show for today's session (#1); called and left message.  Deniece Ree PT, DPT [phone]

## 2016-12-06 ENCOUNTER — Telehealth (HOSPITAL_COMMUNITY): Payer: Self-pay | Admitting: Family Medicine

## 2016-12-06 NOTE — Telephone Encounter (Signed)
12/06/16 called patient to let him know that we needed to cancel the 12/15 appt bcause the therapist isn't here also to remind him of his 12/13 appt and to please call if he can't come on Wed.

## 2016-12-07 ENCOUNTER — Ambulatory Visit (HOSPITAL_COMMUNITY): Payer: 59 | Admitting: Physical Therapy

## 2016-12-07 ENCOUNTER — Telehealth (HOSPITAL_COMMUNITY): Payer: Self-pay | Admitting: Family Medicine

## 2016-12-07 NOTE — Telephone Encounter (Signed)
12/07/16 pt left a message that he got our message about cx the Friday appt.  He said he had to cx today's appt because he had a drs appt at 145

## 2016-12-09 ENCOUNTER — Ambulatory Visit (HOSPITAL_COMMUNITY): Payer: 59 | Admitting: Physical Therapy

## 2016-12-12 ENCOUNTER — Ambulatory Visit (HOSPITAL_COMMUNITY): Payer: 59 | Admitting: Physical Therapy

## 2016-12-12 DIAGNOSIS — R600 Localized edema: Secondary | ICD-10-CM | POA: Diagnosis present

## 2016-12-12 DIAGNOSIS — M25661 Stiffness of right knee, not elsewhere classified: Secondary | ICD-10-CM | POA: Diagnosis present

## 2016-12-12 DIAGNOSIS — R262 Difficulty in walking, not elsewhere classified: Secondary | ICD-10-CM | POA: Diagnosis present

## 2016-12-12 DIAGNOSIS — M6281 Muscle weakness (generalized): Secondary | ICD-10-CM | POA: Diagnosis present

## 2016-12-12 NOTE — Therapy (Addendum)
[facility] [place], [place], [address] Phone: [phone]   Fax:  [phone]  Physical Therapy Treatment/Discharge  Patient Details  Name: Nicholas Delacruz MRN: [id number] Date of Birth: 02-Sep-1966 Referring Provider: Marchia Bond  Encounter Date: 12/12/2016      PT End of Session - 12/12/16 1338    Visit Number 15   Number of Visits 18   Date for PT Re-Evaluation 11/14/16   Authorization Type [employer]    PT Start Time 1302   PT Stop Time 3382   PT Time Calculation (min) 36 min   Activity Tolerance Patient tolerated treatment well;No increased pain   Behavior During Therapy WFL for tasks assessed/performed      Past Medical History:  Diagnosis Date  . Diabetes mellitus without complication ([place])   . GERD (gastroesophageal reflux disease)   . High blood pressure   . Obstructive sleep apnea on CPAP   . PONV (postoperative nausea and vomiting)    blood pressure dropped in recovery    Past Surgical History:  Procedure Laterality Date  . APPENDECTOMY    . KNEE ARTHROSCOPY Right 01/02/2015   Procedure: RIGHT KNEE ARTHROSCOPY, LATERAL AND MEDIAL MENISECTOMY;  Surgeon: Carole Civil, MD;  Location: [facility];  Service: Orthopedics;  Laterality: Right;  . KNEE ARTHROSCOPY WITH MEDIAL MENISECTOMY Left 06/25/2014   Procedure: KNEE ARTHROSCOPY WITH MEDIAL MENISECTOMY;  Surgeon: Carole Civil, MD;  Location: [facility];  Service: Orthopedics;  Laterality: Left;  . KNEE SURGERY Left   . MENISCUS DEBRIDEMENT Right 01/02/2015   Procedure: DEBRIDEMENT OF RIGHT KNEE JOINT;  Surgeon: Carole Civil, MD;  Location: [facility];  Service: Orthopedics;  Laterality: Right;  . right knee sark    . SHOULDER SURGERY     left  . TOTAL KNEE ARTHROPLASTY Right 08/30/2016   Procedure: TOTAL KNEE ARTHROPLASTY;  Surgeon: Marchia Bond, MD;  Location: [facility];  Service: Orthopedics;  Laterality: Right;    There were no vitals filed for  this visit.      Subjective Assessment - 12/12/16 1306    Subjective Pt reports that he had "a viral infection" and didn't want to get anyone sick. He feels that he still doesn't have his mobility back. He is walking better than he was but has to take 1 step at a time when he goes up/down stairs. His pain is at about 2-3/10 max.   Pertinent History no longer taking xarelto, HTN, L LE weakness, DM, OSA, GERD, extensive B knee surgical history    Patient Stated Goals return to PLOF    Currently in Pain? No/denies   Pain Onset More than a month ago            [facility] PT Assessment - 12/12/16 0001      Assessment   Medical Diagnosis R knee replacement    Referring Provider Marchia Bond   Onset Date/Surgical Date --  about 4 weeks ago    Next MD Visit 01/17/17     Precautions   Precaution Comments none known     Balance Screen   Has the patient fallen in the past 6 months No   Has the patient had a decrease in activity level because of a fear of falling?  No   Is the patient reluctant to leave their home because of a fear of falling?  No     Prior Function   Level of Independence Independent;Independent with basic ADLs;Independent with  gait;Independent with transfers   Vocation On disability   Vocation Requirements 12 hour shifts, on his feet at [employer]   Overall Cognitive Status Within Functional Limits for tasks assessed     AROM   Right Knee Extension --  WNL   Right Knee Flexion 83     Strength   Right Hip Flexion 5/5   Right Hip Extension 5/5   Right Hip ABduction 5/5   Left Hip Flexion 5/5   Left Hip Extension 5/5   Left Hip ABduction 5/5   Right Knee Flexion 5/5   Right Knee Extension 5/5   Left Knee Flexion 5/5   Left Knee Extension 5/5   Right Ankle Dorsiflexion 5/5   Left Ankle Dorsiflexion 5/5     6 minute walk test results    Aerobic Endurance Distance Walked 646   Endurance additional comments 3MWT      High Level Balance    High Level Balance Comments BLE: 20 sec                      [facility] Adult PT Treatment/Exercise - 12/12/16 0001      Ambulation/Gait   Pre-Gait Activities Ascend/descend 6" steps with B handrails, Lt step to pattern x1 RT; Ascend/descend 4" steps with B handrails using reciprocal pattern x1 RT                PT Education - 12/12/16 1341    Education provided Yes   Education Details encouraged pt to continue with updated HEP with the hope of making progress in knee flexion ROM. Discussed noted plateau in knee ROM progress over the past month or so despite therapist efforts   Person(s) Educated Patient   Methods Explanation   Comprehension Verbalized understanding          PT Short Term Goals - 12/12/16 1323      PT SHORT TERM GOAL #1   Title Patient to demonstrate R knee ROM 0-100 degrees in order to improve mechanics and general gait pattern    Baseline 0-5-83   Time 3   Period Weeks   Status Not Met     PT SHORT TERM GOAL #2   Title Patient to be able to ambulate unlimited distances with no assistive device, R knee pain no more than 2/10, in order to improve general mobility    Baseline no device, pain no higher than 2-3/10 VAS   Time 3   Period Weeks   Status Achieved     PT SHORT TERM GOAL #3   Title Patient to be able to ambulate at least 571f during 3MWT in order to show improved mobility and facilitate return to PLOF    Baseline 646 ft   Time 3   Period Weeks   Status Achieved     PT SHORT TERM GOAL #4   Title Patient to be independent in correctly and consistently performing appropraite HEP, to be updated PRN    Baseline 10/30- reports compliance    Time 1   Period Weeks   Status Achieved           PT Long Term Goals - 12/12/16 1325      PT LONG TERM GOAL #1   Title Patient to demonstrate ROM R knee 0-115 degrees in order to improve general gait mechanics and improve general mobility    Baseline 10/30- 9-76   Time 6   Period  Weeks   Status Not Met     PT LONG TERM GOAL #2   Title Patient to be able to maintain SLS each LE for at least 30 seconds B in order to show improved balance skills and reduced fall risk    Baseline 20+ sec    Time 6   Period Weeks   Status Partially Met     PT LONG TERM GOAL #3   Title Patient to be able to ambulate without difficulty over grass and gravel with no assistive device, minimal fall risk, in order to assist in return to PLOF    Baseline 10/30- reports succcess but this is more difficult    Time 6   Period Weeks   Status Partially Met     PT LONG TERM GOAL #4   Title Patient to be able to reciprocally ascend/descend full flight of stairs, U railing, reciprocal pattern, minimal unsteadiness, good eccentric control, in order to improve mobilty at home and within community    Time 6   Period Weeks   Status Partially Met               Plan - 01-02-17 1627    Clinical Impression Statement Pt has not been to PT for the past several weeks due to illness. Overall he has made good progress in ROM, strength, balance and endurance since beginning PT several weeks ago. His remaining impairment at this time is his lack of Rt knee flexion ROM. He has been ranging from 76-83 degrees over the past month with minimal improvements despite therapist efforts in past sessions to improve ROM. He demonstrates step to pattern with stair negotiation which is largely limited due to fear of discomfort and improved with heavy encouragement from the therapist. At this time, the evaluating therapist believes he no longer requires skilled PT to address his limitation in knee mobility. He was urged to continue with his HEP daily on his own to further improve ROM. He feels comfortable with this and has demonstrated proper technique and performance of these exercises in the past.  Will d/c at this time with updated HEP.   Rehab Potential Good   Clinical Impairments Affecting Rehab Potential history of  multiple knee injuries/surgeries; history of knee rehabilitation   PT Frequency 3x / week   PT Duration 6 weeks   PT Treatment/Interventions ADLs/Self Care Home Management;Biofeedback;Cryotherapy;Moist Heat;DME Instruction;Gait training;Stair training;Functional mobility training;Therapeutic activities;Therapeutic exercise;Balance training;Neuromuscular re-education;Patient/family education;Manual techniques;Scar mobilization;Passive range of motion;Energy conservation;Taping   PT Next Visit Plan Continue with Biodex for ROM. Start on Nustep for limbering knee for ROM, then focus on ROM and gait.  F/U with ability to purchase compression hose from [place] vs. addition referal needed for hose.     PT Home Exercise Plan seated knee flexion stretch, standing knee flexion stretch on step, tall kneel sitting back on heels to stretch knee.    Consulted and Agree with Plan of Care Patient      Patient will benefit from skilled therapeutic intervention in order to improve the following deficits and impairments:  Abnormal gait, Decreased skin integrity, Increased fascial restricitons, Improper body mechanics, Pain, Decreased mobility, Decreased coordination, Postural dysfunction, Decreased range of motion, Decreased strength, Hypomobility, Decreased balance, Difficulty walking, Increased edema, Impaired flexibility  Visit Diagnosis: Stiffness of right knee, not elsewhere classified  Localized edema  Difficulty in walking, not elsewhere classified  Muscle weakness (generalized)       G-Codes - 2017/01/02 1632    Functional Assessment Tool  Used Clinical judgement based on assessment of ROM, strength and functional mobility.    Functional Limitation Mobility: Walking and moving around   Mobility: Walking and Moving Around Goal Status [phone]) At least 20 percent but less than 40 percent impaired, limited or restricted   Mobility: Walking and Moving Around Discharge Status [phone]) At least 20 percent  but less than 40 percent impaired, limited or restricted      Problem List Patient Active Problem List   Diagnosis Date Noted  . S/P total knee arthroplasty 08/30/2016  . Weakness of left leg 03/13/2014  . Difficulty in walking(719.7) 03/13/2014  . Arthritis of knee, left 01/21/2014  . Cervicalgia 11/08/2011  . RHEUMATOID ARTHRITIS 02/01/2011  . RUPTURE ROTATOR CUFF 02/01/2011  . IMPINGEMENT SYNDROME 12/07/2010  . Primary localized osteoarthritis of right knee 09/28/2009  . Essential hypertension 05/13/2009   PHYSICAL THERAPY DISCHARGE SUMMARY  Visits from Start of Care: 15  Current functional level related to goals / functional outcomes: Balance, strength, endurance all WNL.    Remaining deficits: Rt knee extension ROM ~83 degrees. Using step to pattern with regular stairs, requiring verbal cues to improve technique.   Education / Equipment: encouraged pt to continue with updated HEP with the hope of making progress in knee flexion ROM. Discussed noted plateau in knee ROM progress over the past month or so despite therapist efforts. Importance of performing activities with equal use of Rt and Lt LE to promote increased mobility. Plan: Patient agrees to discharge.  Patient goals were partially met. Patient is being discharged due to lack of progress.  ?????        4:38 PM,12/12/16 Elly Modena PT, DPT [facility] [place] [address] [place], [place], [address] Phone: [phone]   Fax:  [phone]  Name: Nicholas Delacruz MRN: [id number] Date of Birth: Sep 15, 1966   * addendum to updated certification period. 4:43 PM,12/12/16 Elly Modena PT, [facility] [phone]

## 2016-12-12 NOTE — Addendum Note (Signed)
Addended by: Elly Modena E on: 12/12/2016 04:43 PM   Modules accepted: Orders

## 2016-12-14 ENCOUNTER — Ambulatory Visit (HOSPITAL_COMMUNITY): Payer: 59 | Admitting: Physical Therapy

## 2016-12-16 ENCOUNTER — Ambulatory Visit (HOSPITAL_COMMUNITY): Payer: 59 | Admitting: Physical Therapy

## 2016-12-29 ENCOUNTER — Encounter (HOSPITAL_COMMUNITY): Payer: 59

## 2017-01-03 ENCOUNTER — Encounter (HOSPITAL_COMMUNITY): Payer: 59 | Admitting: Physical Therapy

## 2017-01-05 ENCOUNTER — Encounter (HOSPITAL_COMMUNITY): Payer: 59 | Admitting: Physical Therapy

## 2017-01-10 ENCOUNTER — Encounter (HOSPITAL_COMMUNITY): Payer: 59 | Admitting: Physical Therapy

## 2017-01-12 ENCOUNTER — Encounter (HOSPITAL_COMMUNITY): Payer: 59

## 2017-01-18 DIAGNOSIS — M1711 Unilateral primary osteoarthritis, right knee: Secondary | ICD-10-CM | POA: Diagnosis not present

## 2017-03-15 DIAGNOSIS — M19012 Primary osteoarthritis, left shoulder: Secondary | ICD-10-CM | POA: Diagnosis not present

## 2017-03-15 DIAGNOSIS — M25561 Pain in right knee: Secondary | ICD-10-CM | POA: Diagnosis not present

## 2017-03-15 DIAGNOSIS — M1712 Unilateral primary osteoarthritis, left knee: Secondary | ICD-10-CM | POA: Diagnosis not present

## 2017-04-11 DIAGNOSIS — G4733 Obstructive sleep apnea (adult) (pediatric): Secondary | ICD-10-CM | POA: Diagnosis not present

## 2017-04-19 ENCOUNTER — Other Ambulatory Visit: Payer: Self-pay | Admitting: Orthopedic Surgery

## 2017-04-19 DIAGNOSIS — M25512 Pain in left shoulder: Secondary | ICD-10-CM

## 2017-05-02 DIAGNOSIS — E782 Mixed hyperlipidemia: Secondary | ICD-10-CM | POA: Diagnosis not present

## 2017-05-02 DIAGNOSIS — M1990 Unspecified osteoarthritis, unspecified site: Secondary | ICD-10-CM | POA: Diagnosis not present

## 2017-05-02 DIAGNOSIS — G4730 Sleep apnea, unspecified: Secondary | ICD-10-CM | POA: Diagnosis not present

## 2017-05-08 ENCOUNTER — Ambulatory Visit
Admission: RE | Admit: 2017-05-08 | Discharge: 2017-05-08 | Disposition: A | Discharge status: Home / Self Care | Payer: 59 | Source: Ambulatory Visit | Attending: Orthopedic Surgery | Admitting: Orthopedic Surgery

## 2017-05-08 DIAGNOSIS — M25512 Pain in left shoulder: Secondary | ICD-10-CM

## 2017-05-08 DIAGNOSIS — M75102 Unspecified rotator cuff tear or rupture of left shoulder, not specified as traumatic: Secondary | ICD-10-CM | POA: Diagnosis not present

## 2017-05-08 MED ORDER — IOPAMIDOL (ISOVUE-M 200) INJECTION 41%
20.0000 mL | Freq: Once | INTRAMUSCULAR | Status: AC
  Administered 2017-05-08: 20 mL via INTRA_ARTICULAR

## 2017-05-08 MED ORDER — METHYLPREDNISOLONE ACETATE 40 MG/ML INJ SUSP (RADIOLOG
120.0000 mg | Freq: Once | INTRAMUSCULAR | Status: AC
  Administered 2017-05-08: 120 mg via INTRA_ARTICULAR

## 2017-05-10 DIAGNOSIS — M1712 Unilateral primary osteoarthritis, left knee: Secondary | ICD-10-CM | POA: Diagnosis not present

## 2017-05-10 DIAGNOSIS — M75112 Incomplete rotator cuff tear or rupture of left shoulder, not specified as traumatic: Secondary | ICD-10-CM | POA: Diagnosis not present

## 2017-05-15 DIAGNOSIS — G4733 Obstructive sleep apnea (adult) (pediatric): Secondary | ICD-10-CM | POA: Diagnosis not present

## 2017-06-06 DIAGNOSIS — G4733 Obstructive sleep apnea (adult) (pediatric): Secondary | ICD-10-CM | POA: Diagnosis not present

## 2017-06-26 DIAGNOSIS — R7989 Other specified abnormal findings of blood chemistry: Secondary | ICD-10-CM | POA: Diagnosis not present

## 2017-07-19 DIAGNOSIS — M130 Polyarthritis, unspecified: Secondary | ICD-10-CM | POA: Diagnosis not present

## 2017-07-19 DIAGNOSIS — M2550 Pain in unspecified joint: Secondary | ICD-10-CM | POA: Diagnosis not present

## 2017-07-19 DIAGNOSIS — Z1389 Encounter for screening for other disorder: Secondary | ICD-10-CM | POA: Diagnosis not present

## 2017-07-19 DIAGNOSIS — M1991 Primary osteoarthritis, unspecified site: Secondary | ICD-10-CM | POA: Diagnosis not present

## 2017-07-19 DIAGNOSIS — E782 Mixed hyperlipidemia: Secondary | ICD-10-CM | POA: Diagnosis not present

## 2017-07-21 ENCOUNTER — Other Ambulatory Visit (HOSPITAL_COMMUNITY): Payer: 59

## 2017-08-07 DIAGNOSIS — M25512 Pain in left shoulder: Secondary | ICD-10-CM | POA: Diagnosis not present

## 2017-08-07 DIAGNOSIS — M1712 Unilateral primary osteoarthritis, left knee: Secondary | ICD-10-CM | POA: Diagnosis not present

## 2017-08-10 ENCOUNTER — Encounter (HOSPITAL_COMMUNITY): Payer: Self-pay

## 2017-08-11 ENCOUNTER — Inpatient Hospital Stay (HOSPITAL_COMMUNITY)
Admission: RE | Admit: 2017-08-11 | Discharge: 2017-08-11 | Disposition: A | Discharge status: Home / Self Care | Payer: 59 | Source: Ambulatory Visit

## 2017-08-22 ENCOUNTER — Ambulatory Visit (HOSPITAL_COMMUNITY): Admission: RE | Admit: 2017-08-22 | Payer: 59 | Source: Ambulatory Visit | Admitting: Orthopedic Surgery

## 2017-08-22 ENCOUNTER — Encounter (HOSPITAL_COMMUNITY): Admission: RE | Payer: Self-pay | Source: Ambulatory Visit

## 2017-08-22 SURGERY — ARTHROPLASTY, KNEE, TOTAL
Anesthesia: Spinal | Laterality: Left

## 2017-08-24 DIAGNOSIS — R7989 Other specified abnormal findings of blood chemistry: Secondary | ICD-10-CM | POA: Diagnosis not present

## 2017-09-06 DIAGNOSIS — G4733 Obstructive sleep apnea (adult) (pediatric): Secondary | ICD-10-CM | POA: Diagnosis not present

## 2017-09-22 DIAGNOSIS — R7989 Other specified abnormal findings of blood chemistry: Secondary | ICD-10-CM | POA: Diagnosis not present

## 2017-09-28 DIAGNOSIS — L309 Dermatitis, unspecified: Secondary | ICD-10-CM | POA: Diagnosis not present

## 2017-09-28 DIAGNOSIS — D2239 Melanocytic nevi of other parts of face: Secondary | ICD-10-CM | POA: Diagnosis not present

## 2017-09-28 DIAGNOSIS — B079 Viral wart, unspecified: Secondary | ICD-10-CM | POA: Diagnosis not present

## 2017-09-28 DIAGNOSIS — D485 Neoplasm of uncertain behavior of skin: Secondary | ICD-10-CM | POA: Diagnosis not present

## 2017-10-06 DIAGNOSIS — G4733 Obstructive sleep apnea (adult) (pediatric): Secondary | ICD-10-CM | POA: Diagnosis not present

## 2017-10-06 NOTE — Progress Notes (Signed)
Office Visit Note  Patient: Nicholas Delacruz             Date of Birth: 06/06/1966           MRN: [id number]             PCP: Sharilyn Sites, MD Referring: Sharilyn Sites, MD Visit Date: 10/11/2017 Occupation: technician    Subjective:  New Patient (Initial Visit) (Joint pain)   History of Present Illness: Nicholas Delacruz is a 51 y.o. male him seen in consultation per request of his PCP. According to patient his symptoms a started about 5 years ago with increased joint pain and stiffness. He has seen Dr. Tommie Raymond for several years. He recalls having left rotator cuff tear repair in the past which is still causes discomfort. He states he has discomfort in his bilateral shoulders. He has had several surgeries on his bilateral knee joints. He states he had right total knee replacement by Dr. Mardelle Matte in September 2017. He continues to have discomfort in his left knee joint. He states he has end-stage arthritis and will require total knee replacement on left as well. He complains of pain in his bilateral hip joints, bilateral ankle joints in his feet. He has noticed swelling in his left ankle joint. In the last year he's been having increased pain and swelling in his bilateral wrist joints and bilateral hands. He has noticed intermittent swelling in his bilateral wrist and hands. He has noticed decreased grip strength. His been experiencing nocturnal pain in multiple joints.  Activities of Daily Living:  Patient reports morning stiffness for 30 minutes.   Patient Reports nocturnal pain.  Difficulty dressing/grooming: Reports Difficulty climbing stairs: Reports Difficulty getting out of chair: Reports Difficulty using hands for taps, buttons, cutlery, and/or writing: Denies   Review of Systems  Constitutional: Positive for activity change and fatigue. Negative for night sweats and weakness.  HENT: Negative for mouth sores, mouth dryness and nose dryness.   Eyes: Negative for redness and dryness.   Respiratory: Negative for shortness of breath and difficulty breathing.   Cardiovascular: Positive for hypertension and swelling in legs/feet. Negative for chest pain, palpitations and irregular heartbeat.  Gastrointestinal: Positive for diarrhea. Negative for constipation.       History of IBS  Endocrine: Positive for heat intolerance. Negative for increased urination.  Genitourinary: Negative for difficulty urinating and painful urination.  Musculoskeletal: Positive for arthralgias, joint pain, joint swelling, morning stiffness and muscle tenderness. Negative for myalgias, muscle weakness and myalgias.  Skin: Negative for color change, rash, hair loss, nodules/bumps, skin tightness, ulcers and sensitivity to sunlight.  Allergic/Immunologic: Negative for susceptible to infections.  Neurological: Negative for dizziness, fainting, numbness, headaches, memory loss and night sweats.  Hematological: Negative for bruising/bleeding tendency and swollen glands.  Psychiatric/Behavioral: Positive for sleep disturbance. Negative for depressed mood. The patient is not nervous/anxious.     PMFS History:  Patient Active Problem List   Diagnosis Date Noted  . S/P total knee arthroplasty 08/30/2016  . Weakness of left leg 03/13/2014  . Difficulty in walking(719.7) 03/13/2014  . Arthritis of knee, left 01/21/2014  . Cervicalgia 11/08/2011  . RHEUMATOID ARTHRITIS 02/01/2011  . RUPTURE ROTATOR CUFF 02/01/2011  . IMPINGEMENT SYNDROME 12/07/2010  . Primary localized osteoarthritis of right knee 09/28/2009  . Essential hypertension 05/13/2009    Past Medical History:  Diagnosis Date  . Diabetes mellitus without complication ([place])   . GERD (gastroesophageal reflux disease)   . High blood pressure   .  Obstructive sleep apnea on CPAP   . PONV (postoperative nausea and vomiting)    blood pressure dropped in recovery    Family History  Problem Relation Age of Onset  . Alzheimer's disease Father   .  Arthritis Unknown    Past Surgical History:  Procedure Laterality Date  . APPENDECTOMY    . KNEE ARTHROPLASTY    . KNEE ARTHROSCOPY Right 01/02/2015   Procedure: RIGHT KNEE ARTHROSCOPY, LATERAL AND MEDIAL MENISECTOMY;  Surgeon: Carole Civil, MD;  Location: [facility];  Service: Orthopedics;  Laterality: Right;  . KNEE ARTHROSCOPY WITH MEDIAL MENISECTOMY Left 06/25/2014   Procedure: KNEE ARTHROSCOPY WITH MEDIAL MENISECTOMY;  Surgeon: Carole Civil, MD;  Location: [facility];  Service: Orthopedics;  Laterality: Left;  . KNEE SURGERY Left   . MENISCUS DEBRIDEMENT Right 01/02/2015   Procedure: DEBRIDEMENT OF RIGHT KNEE JOINT;  Surgeon: Carole Civil, MD;  Location: [facility];  Service: Orthopedics;  Laterality: Right;  . right knee sark    . SHOULDER SURGERY     left  . TOTAL KNEE ARTHROPLASTY Right 08/30/2016   Procedure: TOTAL KNEE ARTHROPLASTY;  Surgeon: Marchia Bond, MD;  Location: [facility];  Service: Orthopedics;  Laterality: Right;  . TOTAL SHOULDER ARTHROPLASTY     Social History   Social History Narrative  . No narrative on file     Objective: Vital Signs: BP (!) 171/77 (BP Location: Left Arm, Patient Position: Sitting, Cuff Size: Normal)   Pulse 69   Resp 18   Ht 5' 11.5" (1.816 m)   Wt (!) 314 lb (142.4 kg)   BMI 43.18 kg/m    Physical Exam  Constitutional: He is oriented to person, place, and time. He appears well-developed and well-nourished.  HENT:  Head: Normocephalic and atraumatic.  Eyes: Pupils are equal, round, and reactive to light. Conjunctivae and EOM are normal.  Neck: Normal range of motion. Neck supple.  Cardiovascular: Normal rate, regular rhythm and normal heart sounds.   Pulmonary/Chest: Effort normal and breath sounds normal.  Abdominal: Soft. Bowel sounds are normal.  Neurological: He is alert and oriented to person, place, and time.  Skin: Skin is warm and dry. Capillary refill takes less than 2 seconds.  Psychiatric: He has a normal mood and  affect. His behavior is normal.  Nursing note and vitals reviewed.    Musculoskeletal Exam: C-spine and thoracic lumbar spine limited range of motion without discomfort. Shoulder joints elbow joints with good range of motion. Right wrist joint had tenderness with limited range of motion. Left wrist joint with no tenderness on exam. No MCP swelling or tenderness was noted. He has DIP PIP thickening bilaterally consistent with osteoarthritis. He had painful limited range of motion of his right hip joint. His right total knee replacement appears to be doing well. He had discomfort with range of motion of his left knee joint without any warmth swelling or effusion. He had tenderness on palpation over his left ankle. He has some MTP tenderness across but no synovitis noted.  CDAI Exam: No CDAI exam completed.    Investigation: Findings:  07/19/2017 CBC normal, CMP normal except for Glucose elevated 185, LDL 154, PSA normal 0.8., TSH normal, ESR7 07/21/15 normal RF, CRP,CCP,IGG/IGA, and Sed rate    Imaging: Xr Hip Unilat W Or W/o Pelvis 2-3 Views Right  Result Date: 10/11/2017 Mild narrowing of the hip joint was noted. No chondrocalcinosis was noted. Impression: These findings are consistent with mild osteoarthritis of the hip joint.  Xr Ankle 2 Views Left  Result Date: 10/11/2017 No joint space narrowing or erosive changes were noted. Normal x-ray of the ankle joint.  Xr Foot 2 Views Left  Result Date: 10/11/2017 PIP/DIP and first MTP narrowing was noted. No other MTP narrowing or intertarsal joint space narrowing was noted. No erosive changes were noted. Impression: Findings were consistent with osteoarthritis of the foot.  Xr Foot 2 Views Right  Result Date: 10/11/2017 PIP/DIP and first MTP narrowing was noted. No other MTP narrowing or intertarsal joint space narrowing was noted. No erosive changes were noted. Impression: Findings were consistent with osteoarthritis of the foot  Xr  Hand 2 View Left  Result Date: 10/11/2017 PIP/DIP narrowing was noted. No MCP or intercarpal joint space narrowing was noted. No erosive changes were noted. Impression: X-ray was consistent with osteoarthritis of the hand.  Xr Hand 2 View Right  Result Date: 10/11/2017 PIP/DIP narrowing was noted. No MCP or intercarpal joint space narrowing was noted. No erosive changes were noted. Impression: X-ray was consistent with osteoarthritis of the hand.  Xr Knee 3 View Left  Result Date: 10/11/2017 Severe medial compartment narrowing was noted. Severe patellofemoral narrowing was noted. No chondrocalcinosis was noted. Impression severe osteoarthritis and severe chondromalacia of the knee joint   Speciality Comments: No specialty comments available.    Procedures:  No procedures performed Allergies: Levofloxacin   Assessment / Plan:     Visit Diagnoses: Multiple joint pain - Plan: Rheumatoid factor, Cyclic citrul peptide antibody, IgG, 14-3-3 eta Protein, ANA, Angiotensin converting enzyme  Status post total right knee replacement - 08/2016 by dr. Mardelle Matte  Arthritis of knee, left - Plan: XR KNEE 3 VIEW LEFT  Myalgia - Plan: CK, Hepatitis panel, acute  History of hypertension  History of hyperlipidemia  History of sleep apnea  Primary insomnia  Family history of rheumatoid arthritis - sister  Bilateral hand pain - Plan: XR Hand 2 View Right, XR Hand 2 View Left, Rheumatoid factor, Cyclic citrul peptide antibody, IgG, 14-3-3 eta Protein, ANA, Angiotensin converting enzyme  Pain in left ankle and joints of left foot - Plan: XR Ankle 2 Views Left, XR Foot 2 Views Right, XR Foot 2 Views Left  Bilateral foot pain  Pain in right hip - Plan: XR HIP UNILAT W OR W/O PELVIS 2-3 VIEWS RIGHT  Other fatigue - Plan: CK, Hepatitis panel, acute    Orders: Orders Placed This Encounter  Procedures  . XR Hand 2 View Right  . XR Hand 2 View Left  . XR KNEE 3 VIEW LEFT  . XR Ankle 2  Views Left  . XR Foot 2 Views Right  . XR Foot 2 Views Left  . XR HIP UNILAT W OR W/O PELVIS 2-3 VIEWS RIGHT  . CK  . Rheumatoid factor  . Cyclic citrul peptide antibody, IgG  . 14-3-3 eta Protein  . ANA  . Hepatitis panel, acute  . Angiotensin converting enzyme   No orders of the defined types were placed in this encounter.   Face-to-face time spent with patient was 60 minutes. Greater than 50% of time was spent in counseling and coordination of care.  Follow-Up Instructions: Return for Polyarthralgia.   Bo Merino, MD  Note - This record has been created using Editor, commissioning.  Chart creation errors have been sought, but may not always  have been located. Such creation errors do not reflect on  the standard of medical care.

## 2017-10-11 ENCOUNTER — Ambulatory Visit (INDEPENDENT_AMBULATORY_CARE_PROVIDER_SITE_OTHER): Payer: Self-pay

## 2017-10-11 ENCOUNTER — Encounter: Payer: Self-pay | Admitting: Rheumatology

## 2017-10-11 ENCOUNTER — Ambulatory Visit (INDEPENDENT_AMBULATORY_CARE_PROVIDER_SITE_OTHER): Payer: 59 | Admitting: Rheumatology

## 2017-10-11 ENCOUNTER — Ambulatory Visit (INDEPENDENT_AMBULATORY_CARE_PROVIDER_SITE_OTHER): Payer: 59

## 2017-10-11 VITALS — BP 171/77 | HR 69 | Resp 18 | Ht 71.5 in | Wt 314.0 lb

## 2017-10-11 DIAGNOSIS — Z8639 Personal history of other endocrine, nutritional and metabolic disease: Secondary | ICD-10-CM

## 2017-10-11 DIAGNOSIS — M79642 Pain in left hand: Secondary | ICD-10-CM | POA: Diagnosis not present

## 2017-10-11 DIAGNOSIS — M791 Myalgia, unspecified site: Secondary | ICD-10-CM

## 2017-10-11 DIAGNOSIS — M1711 Unilateral primary osteoarthritis, right knee: Secondary | ICD-10-CM | POA: Diagnosis not present

## 2017-10-11 DIAGNOSIS — M79641 Pain in right hand: Secondary | ICD-10-CM | POA: Diagnosis not present

## 2017-10-11 DIAGNOSIS — R5383 Other fatigue: Secondary | ICD-10-CM | POA: Diagnosis not present

## 2017-10-11 DIAGNOSIS — Z96651 Presence of right artificial knee joint: Secondary | ICD-10-CM | POA: Diagnosis not present

## 2017-10-11 DIAGNOSIS — M25571 Pain in right ankle and joints of right foot: Secondary | ICD-10-CM | POA: Diagnosis not present

## 2017-10-11 DIAGNOSIS — M25572 Pain in left ankle and joints of left foot: Secondary | ICD-10-CM

## 2017-10-11 DIAGNOSIS — M1712 Unilateral primary osteoarthritis, left knee: Secondary | ICD-10-CM

## 2017-10-11 DIAGNOSIS — M25551 Pain in right hip: Secondary | ICD-10-CM

## 2017-10-11 DIAGNOSIS — M79671 Pain in right foot: Secondary | ICD-10-CM | POA: Diagnosis not present

## 2017-10-11 DIAGNOSIS — Z8261 Family history of arthritis: Secondary | ICD-10-CM | POA: Diagnosis not present

## 2017-10-11 DIAGNOSIS — F5101 Primary insomnia: Secondary | ICD-10-CM

## 2017-10-11 DIAGNOSIS — M2550 Pain in unspecified joint: Secondary | ICD-10-CM

## 2017-10-11 DIAGNOSIS — Z8679 Personal history of other diseases of the circulatory system: Secondary | ICD-10-CM

## 2017-10-11 DIAGNOSIS — Z8669 Personal history of other diseases of the nervous system and sense organs: Secondary | ICD-10-CM | POA: Diagnosis not present

## 2017-10-11 DIAGNOSIS — M79672 Pain in left foot: Secondary | ICD-10-CM

## 2017-10-14 LAB — HEPATITIS PANEL, ACUTE
Hep A IgM: NONREACTIVE
Hep B C IgM: NONREACTIVE
Hepatitis B Surface Ag: NONREACTIVE
Hepatitis C Ab: NONREACTIVE
SIGNAL TO CUT-OFF: 0.01 (ref <1.00)

## 2017-10-14 LAB — CK: Total CK: 151 U/L (ref 44–196)

## 2017-10-14 LAB — RHEUMATOID FACTOR: Rhuematoid fact SerPl-aCnc: <14 IU/mL (ref <14)

## 2017-10-14 LAB — ANA: Anti Nuclear Antibody(ANA): NEGATIVE

## 2017-10-14 LAB — 14-3-3 ETA PROTEIN: 14-3-3 eta Protein: <0.2 ng/mL (ref <0.2)

## 2017-10-14 LAB — CYCLIC CITRUL PEPTIDE ANTIBODY, IGG: Cyclic Citrullin Peptide Ab: <16 UNITS

## 2017-10-14 LAB — ANGIOTENSIN CONVERTING ENZYME: Angiotensin-Converting Enzyme: 31 U/L (ref 9–67)

## 2017-10-16 DIAGNOSIS — R7989 Other specified abnormal findings of blood chemistry: Secondary | ICD-10-CM | POA: Diagnosis not present

## 2017-10-16 NOTE — Progress Notes (Signed)
All of his labs are negative for autoimmune disease. Patient has osteoarthritis for which she's been seen at [facility]. There is nothing else to offer. Please cancel his follow-up appointment and notify patient. Forward his lab results to his PCP and referring physician.

## 2017-11-03 ENCOUNTER — Ambulatory Visit: Payer: 59 | Admitting: Rheumatology

## 2017-11-06 DIAGNOSIS — G4733 Obstructive sleep apnea (adult) (pediatric): Secondary | ICD-10-CM | POA: Diagnosis not present

## 2017-11-20 DIAGNOSIS — M1990 Unspecified osteoarthritis, unspecified site: Secondary | ICD-10-CM | POA: Diagnosis not present

## 2017-11-20 DIAGNOSIS — I10 Essential (primary) hypertension: Secondary | ICD-10-CM | POA: Diagnosis not present

## 2017-11-20 DIAGNOSIS — K219 Gastro-esophageal reflux disease without esophagitis: Secondary | ICD-10-CM | POA: Diagnosis not present

## 2017-11-23 ENCOUNTER — Ambulatory Visit: Payer: 59 | Admitting: General Surgery

## 2017-11-29 DIAGNOSIS — M1712 Unilateral primary osteoarthritis, left knee: Secondary | ICD-10-CM | POA: Diagnosis not present

## 2017-12-06 DIAGNOSIS — G4733 Obstructive sleep apnea (adult) (pediatric): Secondary | ICD-10-CM | POA: Diagnosis not present

## 2017-12-28 DIAGNOSIS — R7989 Other specified abnormal findings of blood chemistry: Secondary | ICD-10-CM | POA: Diagnosis not present

## 2018-01-06 DIAGNOSIS — G4733 Obstructive sleep apnea (adult) (pediatric): Secondary | ICD-10-CM | POA: Diagnosis not present

## 2018-01-11 DIAGNOSIS — Z96659 Presence of unspecified artificial knee joint: Secondary | ICD-10-CM | POA: Diagnosis not present

## 2018-01-11 DIAGNOSIS — G4733 Obstructive sleep apnea (adult) (pediatric): Secondary | ICD-10-CM | POA: Diagnosis not present

## 2018-01-31 DIAGNOSIS — R7989 Other specified abnormal findings of blood chemistry: Secondary | ICD-10-CM | POA: Diagnosis not present

## 2018-02-01 ENCOUNTER — Inpatient Hospital Stay (HOSPITAL_COMMUNITY): Admission: RE | Admit: 2018-02-01 | Payer: 59 | Source: Ambulatory Visit

## 2018-02-02 ENCOUNTER — Other Ambulatory Visit: Payer: Self-pay | Admitting: Orthopedic Surgery

## 2018-02-06 DIAGNOSIS — G4733 Obstructive sleep apnea (adult) (pediatric): Secondary | ICD-10-CM | POA: Diagnosis not present

## 2018-02-15 IMAGING — MR MR SHOULDER*L* W/ CM
6 series · 40 of 40 positions shown · IV contrast (agent unspecified)
Comparison: 01/25/2011

CLINICAL DATA: Left shoulder pain for 4 months. History of rotator
cuff repair 4 years ago. Fell 3 months ago.

EXAM:
MR ARTHROGRAM OF THE left SHOULDER
TECHNIQUE: Multiplanar, multisequence MR imaging of the left shoulder was
performed following the administration of intra-articular contrast.
CONTRAST:  See Injection Documentation.

[Series 4: T1 fat-sat · axial · 4.0mm · 0.27mm/px · z∈[-73,+21]mm · 8 of 21 slices shown (1 of 4)]
[im 1/21]
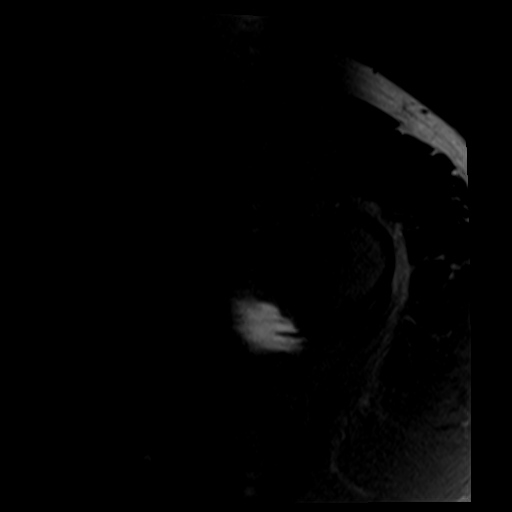
[im 3/21]
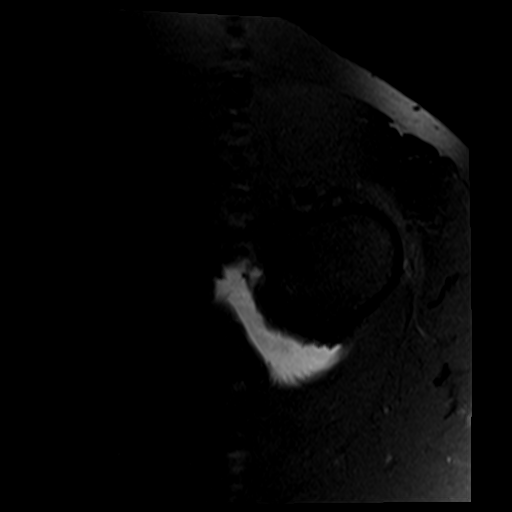
[im 6/21]
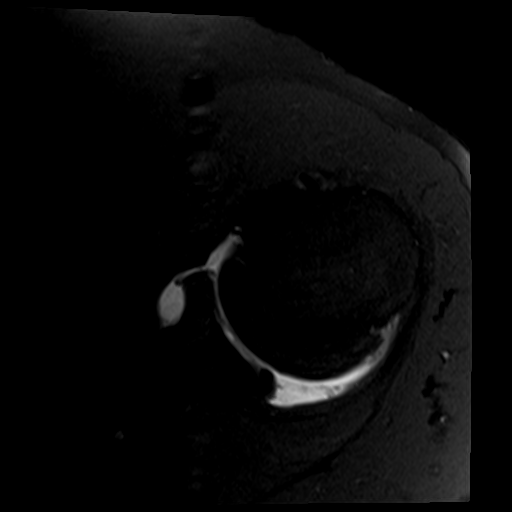
[im 9/21]
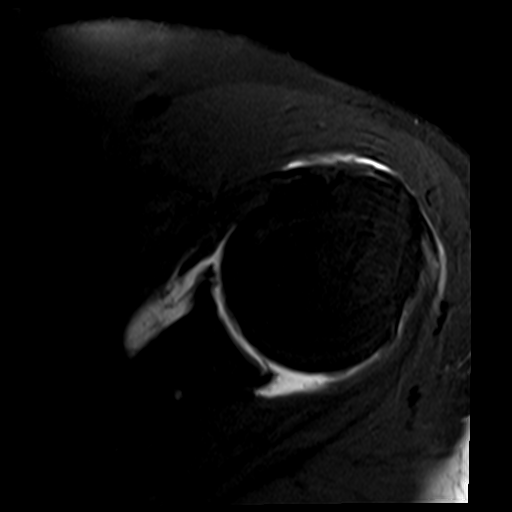
[im 12/21]
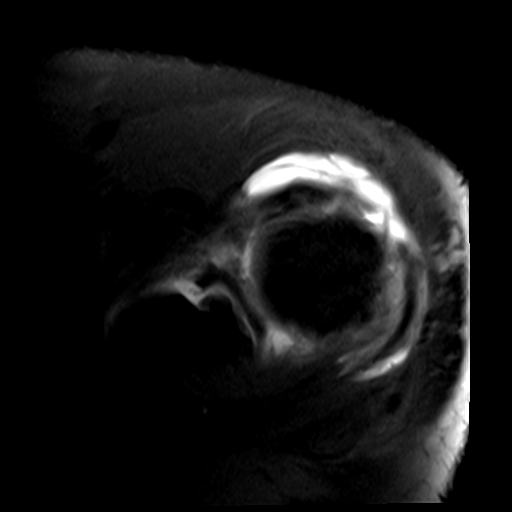
[im 15/21]
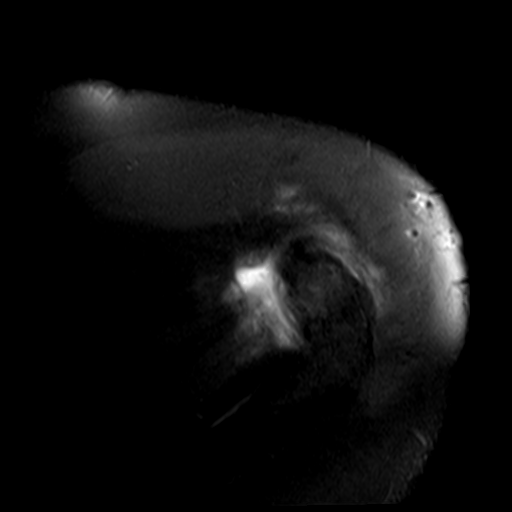
[im 18/21]
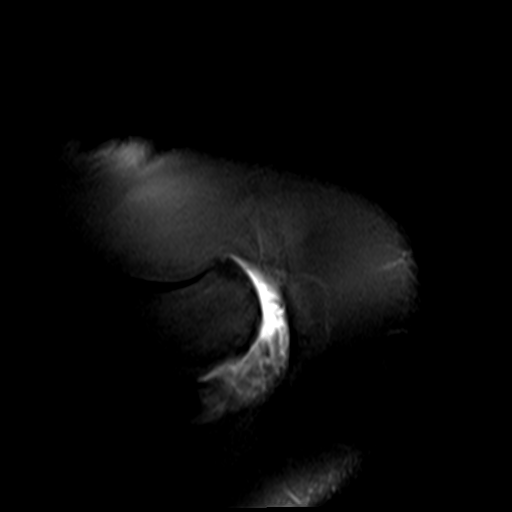
[im 21/21]
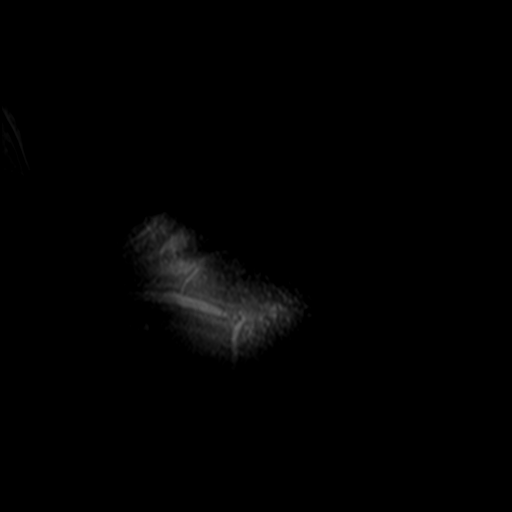

[Series 5: T2 fat-sat · oblique · 4.0mm · 0.55mm/px · 8 of 20 slices shown (1 of 2)]
[im 1/20]
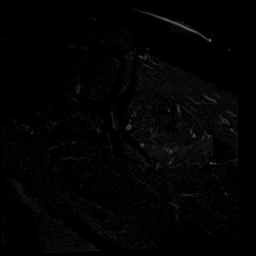
[im 3/20]
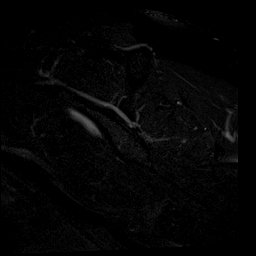
[im 6/20]
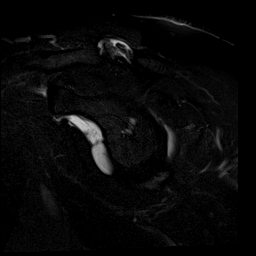
[im 9/20]
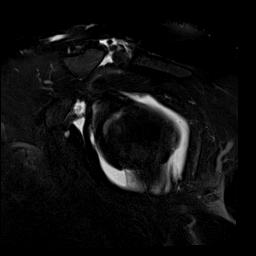
[im 11/20]
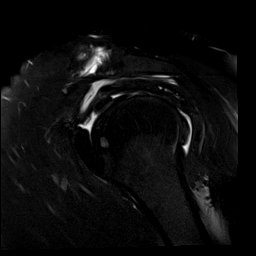
[im 14/20]
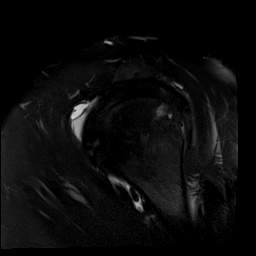
[im 17/20]
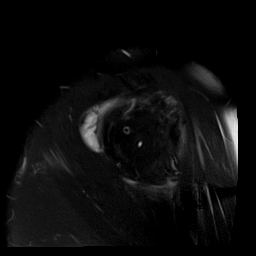
[im 20/20]
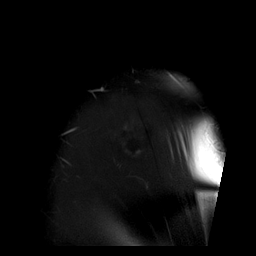

[Series 6: T1 fat-sat · oblique · 4.0mm · 0.44mm/px · 6 of 19 slices shown (2 of 4)]
[im 1/19]
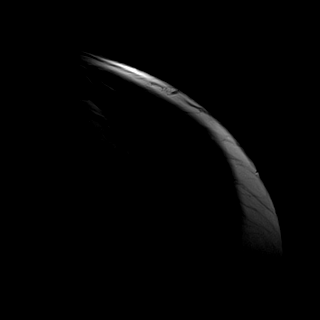
[im 4/19]
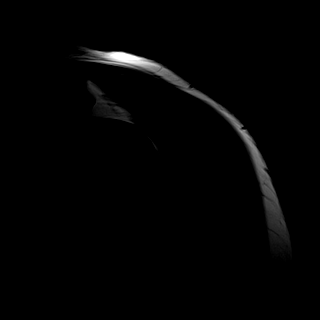
[im 8/19]
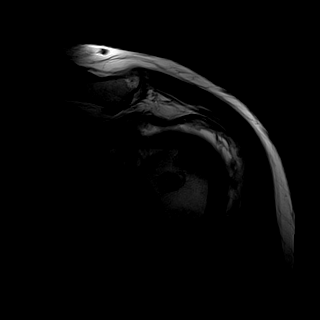
[im 11/19]
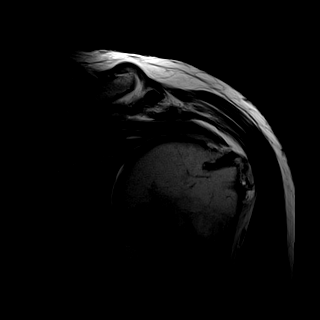
[im 15/19]
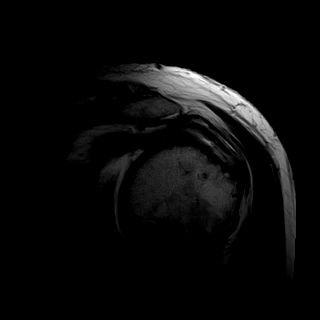
[im 19/19]
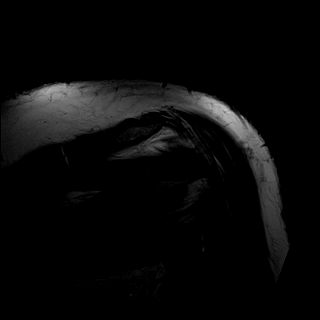

[Series 7: T1 fat-sat · oblique · 4.0mm · 0.55mm/px · 6 of 19 slices shown (3 of 4)]
[im 1/19]
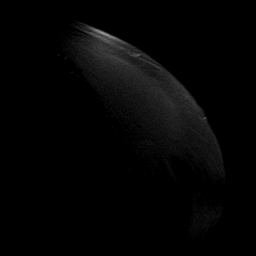
[im 4/19]
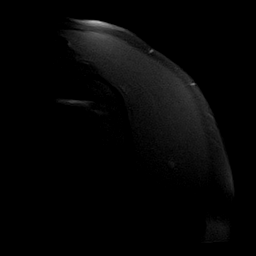
[im 8/19]
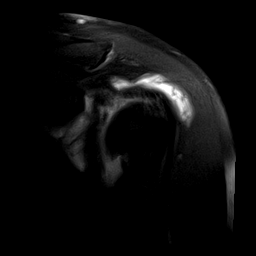
[im 11/19]
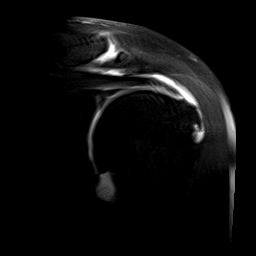
[im 15/19]
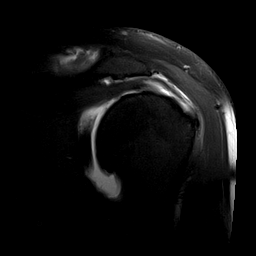
[im 19/19]
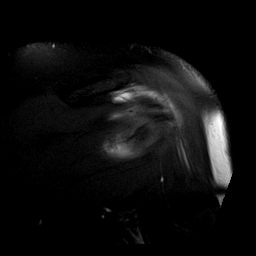

[Series 8: T2 fat-sat · oblique · 4.0mm · 0.55mm/px · 6 of 19 slices shown (2 of 2)]
[im 1/19]
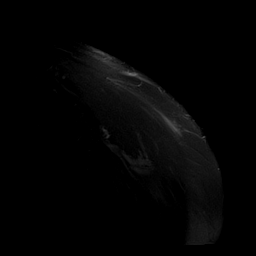
[im 4/19]
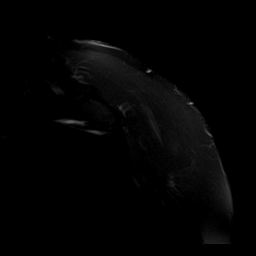
[im 8/19]
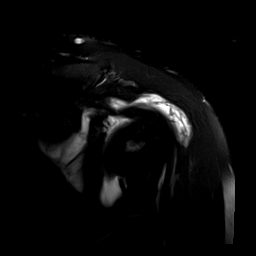
[im 11/19]
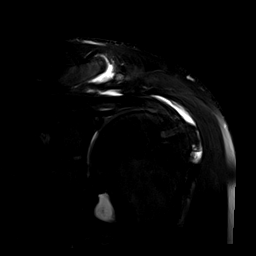
[im 15/19]
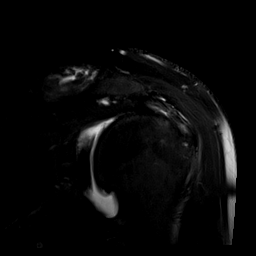
[im 19/19]
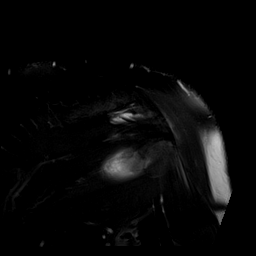

[Series 13: T1 fat-sat · sagittal · 4.0mm · 0.59mm/px · 6 of 19 slices shown (4 of 4)]
[im 1/19]
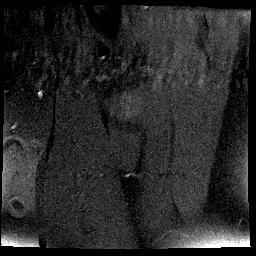
[im 4/19]
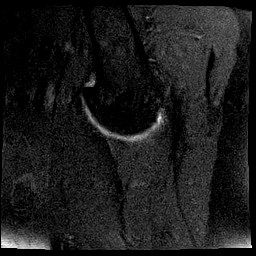
[im 8/19]
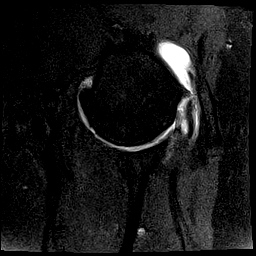
[im 11/19]
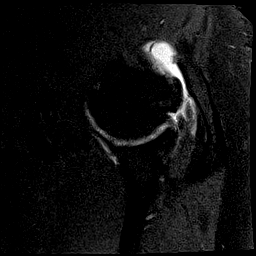
[im 15/19]
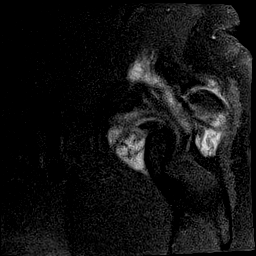
[im 19/19]
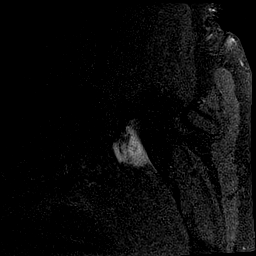

[40 of 40 positions shown; findings below may reference images not displayed]

FINDINGS: Rotator cuff: Surgical changes from a prior rotator cuff repair.
There is an extensive articular surface tear involving the
infraspinatus tendon with laminar retraction of the articular fibers
up to approximately 24.5 mm.

There is also a full-thickness retracted supraspinatus tendon tear
with maximum retraction of 8 mm. The tear is approximately 14 mm
wide.

The subscapularis tendon is intact.

Muscles: Fatty atrophy of the rotator cuff muscles except for the
subscapularis.

Biceps long head: Intact

Acromioclavicular Joint: Severe degenerative changes. Probable
postsurgical changes involving distal clavicle. There is a large
joint effusion and joint debris. No lateral downsloping or
undersurface spurring.

Glenohumeral Joint: Moderate degenerative changes with joint space
narrowing, degenerative chondrosis and osteophytic spurring.

Labrum: No labral tears are identified. There is degenerative
fraying and fibrillation of the anterior labrum. The glenohumeral
ligaments are intact and the anterior labrocapsular complex is
intact.

Bones: No acute bony findings.
IMPRESSION: 1. Full-thickness retracted supraspinatus tendon tear.
2. Extensive articular surface tearing of the infraspinatus tendon.
3. Fatty atrophy of the rotator cuff muscles except the
subscapularis.
4. Intact long head biceps tendon, glenoid labrum, glenohumeral
ligaments and anterior labrocapsular complex.
5. Moderate glenohumeral joint degenerative changes.
6. Advanced AC joint degenerative changes with joint effusion and
inflammatory debris.

## 2018-02-28 ENCOUNTER — Other Ambulatory Visit: Payer: 59 | Admitting: Rheumatology

## 2018-03-01 DIAGNOSIS — R7989 Other specified abnormal findings of blood chemistry: Secondary | ICD-10-CM | POA: Diagnosis not present

## 2018-03-06 DIAGNOSIS — G4733 Obstructive sleep apnea (adult) (pediatric): Secondary | ICD-10-CM | POA: Diagnosis not present

## 2018-03-06 DIAGNOSIS — Z7984 Long term (current) use of oral hypoglycemic drugs: Secondary | ICD-10-CM | POA: Diagnosis not present

## 2018-03-06 DIAGNOSIS — E119 Type 2 diabetes mellitus without complications: Secondary | ICD-10-CM | POA: Diagnosis not present

## 2018-03-06 DIAGNOSIS — H0289 Other specified disorders of eyelid: Secondary | ICD-10-CM | POA: Diagnosis not present

## 2018-03-14 ENCOUNTER — Other Ambulatory Visit: Payer: Self-pay | Admitting: Orthopedic Surgery

## 2018-03-15 NOTE — Pre-Procedure Instructions (Signed)
Nicholas Delacruz  03/15/2018      [place], [place] [address] [place] [address] [place] [address] Phone: [phone] Fax: [phone]    Your procedure is scheduled on March 27, 2018.  Report to [facility] Admitting at 830 AM.  Call this number if you have problems the morning of surgery:  [phone]   Remember:  Do not eat food or drink liquids after midnight.  Take these medicines the morning of surgery with A SIP OF WATER labetalol (normodyne), omeprazole (prilosec).  7 days prior to surgery STOP taking any Aspirin (unless otherwise instructed by your surgeon), Aleve, Naproxen, Ibuprofen, Motrin, Advil, Goody's, BC's, all herbal medications, fish oil, and all vitamins  Continue all other medications as instructed by your physician except follow the above medication instructions before surgery  WHAT DO I DO ABOUT MY DIABETES MEDICATION?  Marland Kitchen Do not take oral diabetes medicines (pills) the morning of surgery: metformin (glucophage).  How to Manage Your Diabetes Before and After Surgery  Why is it important to control my blood sugar before and after surgery? . Improving blood sugar levels before and after surgery helps healing and can limit problems. . A way of improving blood sugar control is eating a healthy diet by: o  Eating less sugar and carbohydrates o  Increasing activity/exercise o  Talking with your doctor about reaching your blood sugar goals . High blood sugars (greater than 180 mg/dL) can raise your risk of infections and slow your recovery, so you will need to focus on controlling your diabetes during the weeks before surgery. . Make sure that the doctor who takes care of your diabetes knows about your planned surgery including the date and location.  How do I manage my blood sugar before surgery? . Check your blood sugar at least 4 times a day, starting 2 days before surgery, to make sure that the level  is not too high or low. o Check your blood sugar the morning of your surgery when you wake up and every 2 hours until you get to the Short Stay unit. . If your blood sugar is less than 70 mg/dL, you will need to treat for low blood sugar: o Do not take insulin. o Treat a low blood sugar (less than 70 mg/dL) with  cup of clear juice (cranberry or apple), 4 glucose tablets, OR glucose gel. Recheck blood sugar in 15 minutes after treatment (to make sure it is greater than 70 mg/dL). If your blood sugar is not greater than 70 mg/dL on recheck, call [phone] o  for further instructions. . Report your blood sugar to the short stay nurse when you get to Short Stay.  . If you are admitted to the hospital after surgery: o Your blood sugar will be checked by the staff and you will probably be given insulin after surgery (instead of oral diabetes medicines) to make sure you have good blood sugar levels. o The goal for blood sugar control after surgery is 80-180 mg/dL.  Reviewed and Endorsed by [facility] Patient Education Committee, August 2015   Do not wear jewelry.  Do not wear lotions, powders, or colognes, or deodorant.  Men may shave face and neck.  Do not bring valuables to the hospital.  [facility] is not responsible for any belongings or valuables.  Contacts, dentures or bridgework may not be worn into surgery.  Leave your suitcase in the car.  After surgery it  may be brought to your room.  For patients admitted to the hospital, discharge time will be determined by your treatment team.  Patients discharged the day of surgery will not be allowed to drive home.    [place]- Preparing For Surgery  Before surgery, you can play an important role. Because skin is not sterile, your skin needs to be as free of germs as possible. You can reduce the number of germs on your skin by washing with CHG (chlorahexidine gluconate) Soap before surgery.  CHG is an antiseptic cleaner which kills  germs and bonds with the skin to continue killing germs even after washing.  Please do not use if you have an allergy to CHG or antibacterial soaps. If your skin becomes reddened/irritated stop using the CHG.  Do not shave (including legs and underarms) for at least 48 hours prior to first CHG shower. It is OK to shave your face.  Please follow these instructions carefully.   1. Shower the NIGHT BEFORE SURGERY and the MORNING OF SURGERY with CHG.   2. If you chose to wash your hair, wash your hair first as usual with your normal shampoo.  3. After you shampoo, rinse your hair and body thoroughly to remove the shampoo.  4. Use CHG as you would any other liquid soap. You can apply CHG directly to the skin and wash gently with a scrungie or a clean washcloth.   5. Apply the CHG Soap to your body ONLY FROM THE NECK DOWN.  Do not use on open wounds or open sores. Avoid contact with your eyes, ears, mouth and genitals (private parts). Wash Face and genitals (private parts)  with your normal soap.  6. Wash thoroughly, paying special attention to the area where your surgery will be performed.  7. Thoroughly rinse your body with warm water from the neck down.  8. DO NOT shower/wash with your normal soap after using and rinsing off the CHG Soap.  9. Pat yourself dry with a CLEAN TOWEL.  10. Wear CLEAN PAJAMAS to bed the night before surgery, wear comfortable clothes the morning of surgery  11. Place CLEAN SHEETS on your bed the night of your first shower and DO NOT SLEEP WITH PETS.  Day of Surgery: Do not apply any deodorants/lotions. Please wear clean clothes to the hospital/surgery center.    Please read over the following fact sheets that you were given. Pain Booklet, Coughing and Deep Breathing, MRSA Information and Surgical Site Infection Prevention

## 2018-03-16 ENCOUNTER — Inpatient Hospital Stay (HOSPITAL_COMMUNITY)
Admission: RE | Admit: 2018-03-16 | Discharge: 2018-03-16 | Disposition: A | Discharge status: Home / Self Care | Payer: 59 | Source: Ambulatory Visit

## 2018-03-16 ENCOUNTER — Encounter (HOSPITAL_COMMUNITY): Payer: Self-pay

## 2018-03-16 HISTORY — DX: Unspecified osteoarthritis, unspecified site: M19.90

## 2018-03-21 ENCOUNTER — Encounter (HOSPITAL_COMMUNITY): Payer: Self-pay

## 2018-03-21 ENCOUNTER — Encounter (HOSPITAL_COMMUNITY)
Admission: RE | Admit: 2018-03-21 | Discharge: 2018-03-21 | Disposition: A | Discharge status: Home / Self Care | Payer: 59 | Source: Ambulatory Visit | Attending: Orthopedic Surgery | Admitting: Orthopedic Surgery

## 2018-03-21 ENCOUNTER — Other Ambulatory Visit: Payer: Self-pay

## 2018-03-21 DIAGNOSIS — Z01812 Encounter for preprocedural laboratory examination: Secondary | ICD-10-CM | POA: Insufficient documentation

## 2018-03-21 DIAGNOSIS — R001 Bradycardia, unspecified: Secondary | ICD-10-CM | POA: Diagnosis not present

## 2018-03-21 DIAGNOSIS — Z01810 Encounter for preprocedural cardiovascular examination: Secondary | ICD-10-CM | POA: Diagnosis present

## 2018-03-21 DIAGNOSIS — I10 Essential (primary) hypertension: Secondary | ICD-10-CM | POA: Diagnosis not present

## 2018-03-21 DIAGNOSIS — E119 Type 2 diabetes mellitus without complications: Secondary | ICD-10-CM | POA: Insufficient documentation

## 2018-03-21 LAB — CBC
HCT: 39.4 % (ref 39.0–52.0)
Hemoglobin: 13 g/dL (ref 13.0–17.0)
MCH: 28.4 pg (ref 26.0–34.0)
MCHC: 33 g/dL (ref 30.0–36.0)
MCV: 86.2 fL (ref 78.0–100.0)
Platelets: 316 10*3/uL (ref 150–400)
RBC: 4.57 MIL/uL (ref 4.22–5.81)
RDW: 13.6 % (ref 11.5–15.5)
WBC: 7.7 10*3/uL (ref 4.0–10.5)

## 2018-03-21 LAB — GLUCOSE, CAPILLARY: Glucose-Capillary: 119 mg/dL — ABNORMAL HIGH (ref 65–99)

## 2018-03-21 LAB — BASIC METABOLIC PANEL
Anion gap: 9 (ref 5–15)
BUN: 20 mg/dL (ref 6–20)
CO2: 21 mmol/L — ABNORMAL LOW (ref 22–32)
Calcium: 9.4 mg/dL (ref 8.9–10.3)
Chloride: 105 mmol/L (ref 101–111)
Creatinine, Ser: 0.96 mg/dL (ref 0.61–1.24)
GFR calc Af Amer: >60 mL/min (ref >60)
GFR calc non Af Amer: >60 mL/min (ref >60)
Glucose, Bld: 125 mg/dL — ABNORMAL HIGH (ref 65–99)
Potassium: 4.3 mmol/L (ref 3.5–5.1)
Sodium: 135 mmol/L (ref 135–145)

## 2018-03-21 LAB — HEMOGLOBIN A1C
Hgb A1c MFr Bld: 6.8 % — ABNORMAL HIGH (ref 4.8–5.6)
Mean Plasma Glucose: 148.46 mg/dL

## 2018-03-22 LAB — SURGICAL PCR SCREEN
MRSA, PCR: POSITIVE — AB
Staphylococcus aureus: POSITIVE — AB

## 2018-03-23 NOTE — Progress Notes (Signed)
Anesthesia Chart Review: Patient is a 52 year old male scheduled for left unicompartmental knee replacement on 03/27/2018 by Dr. Marchia Bond.  History includes never smoker, post-operative N/V, hypertension, diabetes mellitus type 2, GERD, arthritis, OSA (BiPAP), right TKA 08/30/16, appendectomy. BMI is consistent with morbid obesity.   - PCP is listed as Dr. Sharilyn Sites at [facility]. On 07/20/17 office visit, he cleared patient for surgery pending "normal labs." (Scanned under Media tab.)  - Rheumatologist is Dr. Cy Blamer.  Meds include Cardizem CD, labetalol, losartan-HCTZ, metformin, Prilosec, Ambien.   BP (!) 163/77   Pulse (!) 57   Temp 36.8 C   Resp 20   Ht 6' (1.829 m)   Wt (!) 326 lb (147.9 kg)   SpO2 95%   BMI 44.21 kg/m   EKG 03/21/18: SB at 54 bpm, increased R/S ration in V1, consider early transition or posterior infarct. R wave voltage is increased in comparison to 03/15/11, 06/20/14, and 05/06/16 (Media tab) tracings, but has findings of inferior-posterior infarct (with lead III q wave, smaller in II, avF) on 2012 and 2015 tracing.  He denied prior cardiac testing like stress, echo, or cardiac cath.  Preoperative labs noted. Cr 0.96. CBC WNL. A1c 6.8.   Above reviewed with anesthesiologist Dr. Candida Peeling. If patient remains asymptomatic from a CV standpoint and otherwise no acute changes then it is anticipated that he can proceed as planned.  [facility] Short Stay Center/Anesthesiology Phone [phone] 03/23/2018 3:20 PM

## 2018-03-26 ENCOUNTER — Other Ambulatory Visit: Payer: Self-pay | Admitting: Orthopedic Surgery

## 2018-03-26 MED ORDER — VANCOMYCIN HCL 10 G IV SOLR
1500.0000 mg | INTRAVENOUS | Status: AC
  Administered 2018-03-27: 1500 mg via INTRAVENOUS
  Filled 2018-03-26: qty 1500

## 2018-03-26 MED ORDER — DEXTROSE 5 % IV SOLN
3.0000 g | INTRAVENOUS | Status: AC
  Administered 2018-03-27: 3 g via INTRAVENOUS
  Filled 2018-03-26: qty 3

## 2018-03-26 MED ORDER — VANCOMYCIN HCL 10 G IV SOLR
1000.0000 mg | Freq: Once | INTRAVENOUS | Status: AC
  Administered 2018-03-27: 1500 mg via INTRAVENOUS

## 2018-03-27 ENCOUNTER — Observation Stay (HOSPITAL_COMMUNITY)
Admission: RE | Admit: 2018-03-27 | Discharge: 2018-03-29 | Disposition: A | Discharge status: Home / Self Care | Payer: 59 | Source: Ambulatory Visit | Attending: Orthopedic Surgery | Admitting: Orthopedic Surgery

## 2018-03-27 ENCOUNTER — Ambulatory Visit (HOSPITAL_COMMUNITY): Payer: 59 | Admitting: Anesthesiology

## 2018-03-27 ENCOUNTER — Ambulatory Visit (HOSPITAL_COMMUNITY): Payer: 59

## 2018-03-27 ENCOUNTER — Encounter (HOSPITAL_COMMUNITY)
Admission: RE | Disposition: A | Discharge status: Home / Self Care | Payer: Self-pay | Source: Ambulatory Visit | Attending: Orthopedic Surgery

## 2018-03-27 ENCOUNTER — Ambulatory Visit (HOSPITAL_COMMUNITY): Payer: 59 | Admitting: Vascular Surgery

## 2018-03-27 ENCOUNTER — Other Ambulatory Visit: Payer: Self-pay

## 2018-03-27 ENCOUNTER — Encounter (HOSPITAL_COMMUNITY): Payer: Self-pay | Admitting: Surgery

## 2018-03-27 DIAGNOSIS — E119 Type 2 diabetes mellitus without complications: Secondary | ICD-10-CM | POA: Diagnosis not present

## 2018-03-27 DIAGNOSIS — Z6841 Body Mass Index (BMI) 40.0 and over, adult: Secondary | ICD-10-CM | POA: Diagnosis not present

## 2018-03-27 DIAGNOSIS — K219 Gastro-esophageal reflux disease without esophagitis: Secondary | ICD-10-CM | POA: Diagnosis not present

## 2018-03-27 DIAGNOSIS — I10 Essential (primary) hypertension: Secondary | ICD-10-CM | POA: Diagnosis not present

## 2018-03-27 DIAGNOSIS — M069 Rheumatoid arthritis, unspecified: Secondary | ICD-10-CM | POA: Insufficient documentation

## 2018-03-27 DIAGNOSIS — G4733 Obstructive sleep apnea (adult) (pediatric): Secondary | ICD-10-CM | POA: Diagnosis not present

## 2018-03-27 DIAGNOSIS — Z79899 Other long term (current) drug therapy: Secondary | ICD-10-CM | POA: Insufficient documentation

## 2018-03-27 DIAGNOSIS — M1712 Unilateral primary osteoarthritis, left knee: Principal | ICD-10-CM | POA: Insufficient documentation

## 2018-03-27 DIAGNOSIS — Z96652 Presence of left artificial knee joint: Secondary | ICD-10-CM

## 2018-03-27 DIAGNOSIS — G8918 Other acute postprocedural pain: Secondary | ICD-10-CM | POA: Diagnosis not present

## 2018-03-27 DIAGNOSIS — Z471 Aftercare following joint replacement surgery: Secondary | ICD-10-CM | POA: Diagnosis not present

## 2018-03-27 DIAGNOSIS — Z7984 Long term (current) use of oral hypoglycemic drugs: Secondary | ICD-10-CM | POA: Diagnosis not present

## 2018-03-27 HISTORY — DX: Unilateral primary osteoarthritis, left knee: M17.12

## 2018-03-27 HISTORY — PX: PARTIAL KNEE ARTHROPLASTY: SHX2174

## 2018-03-27 LAB — GLUCOSE, CAPILLARY
Glucose-Capillary: 161 mg/dL — ABNORMAL HIGH (ref 65–99)
Glucose-Capillary: 206 mg/dL — ABNORMAL HIGH (ref 65–99)

## 2018-03-27 SURGERY — ARTHROPLASTY, KNEE, UNICOMPARTMENTAL
Anesthesia: General | Site: Knee | Laterality: Left

## 2018-03-27 MED ORDER — LOSARTAN POTASSIUM-HCTZ 100-25 MG PO TABS: 1.0000 | ORAL_TABLET | Freq: Every day | ORAL | Status: DC

## 2018-03-27 MED ORDER — KETOROLAC TROMETHAMINE 30 MG/ML IJ SOLN
INTRAMUSCULAR | Status: AC
  Filled 2018-03-27: qty 1

## 2018-03-27 MED ORDER — FENTANYL CITRATE (PF) 100 MCG/2ML IJ SOLN
INTRAMUSCULAR | Status: AC
  Administered 2018-03-27: 50 ug via INTRAVENOUS
  Filled 2018-03-27: qty 2

## 2018-03-27 MED ORDER — BUPIVACAINE HCL (PF) 0.25 % IJ SOLN
INTRAMUSCULAR | Status: DC | PRN
  Administered 2018-03-27: 20 mL

## 2018-03-27 MED ORDER — ONDANSETRON HCL 4 MG/2ML IJ SOLN
4.0000 mg | Freq: Four times a day (QID) | INTRAMUSCULAR | Status: DC | PRN
Start: 2018-03-27 — End: 2018-03-29
  Administered 2018-03-27: 4 mg via INTRAVENOUS
  Filled 2018-03-27: qty 2

## 2018-03-27 MED ORDER — LOSARTAN POTASSIUM 50 MG PO TABS
100.0000 mg | ORAL_TABLET | Freq: Every day | ORAL | Status: DC
Start: 2018-03-27 — End: 2018-03-29
  Administered 2018-03-29: 100 mg via ORAL
  Filled 2018-03-27 (×2): qty 2

## 2018-03-27 MED ORDER — DEXAMETHASONE SODIUM PHOSPHATE 10 MG/ML IJ SOLN
10.0000 mg | Freq: Once | INTRAMUSCULAR | Status: AC
  Administered 2018-03-28: 10 mg via INTRAVENOUS
  Filled 2018-03-27: qty 1

## 2018-03-27 MED ORDER — METHOCARBAMOL 1000 MG/10ML IJ SOLN
500.0000 mg | Freq: Four times a day (QID) | INTRAVENOUS | Status: DC | PRN
  Filled 2018-03-27: qty 5

## 2018-03-27 MED ORDER — KETOROLAC TROMETHAMINE 15 MG/ML IJ SOLN
15.0000 mg | Freq: Four times a day (QID) | INTRAMUSCULAR | Status: AC
  Administered 2018-03-27 – 2018-03-28 (×4): 15 mg via INTRAVENOUS
  Filled 2018-03-27 (×3): qty 1

## 2018-03-27 MED ORDER — PANTOPRAZOLE SODIUM 40 MG PO TBEC
80.0000 mg | DELAYED_RELEASE_TABLET | Freq: Every day | ORAL | Status: DC
  Administered 2018-03-28 – 2018-03-29 (×2): 80 mg via ORAL
  Filled 2018-03-27 (×2): qty 2

## 2018-03-27 MED ORDER — FENTANYL CITRATE (PF) 100 MCG/2ML IJ SOLN
50.0000 ug | Freq: Once | INTRAMUSCULAR | Status: AC
  Administered 2018-03-27: 50 ug via INTRAVENOUS

## 2018-03-27 MED ORDER — KETOROLAC TROMETHAMINE 15 MG/ML IJ SOLN
INTRAMUSCULAR | Status: AC
  Administered 2018-03-27: 15 mg
  Filled 2018-03-27: qty 1

## 2018-03-27 MED ORDER — SUGAMMADEX SODIUM 500 MG/5ML IV SOLN
INTRAVENOUS | Status: DC | PRN
  Administered 2018-03-27: 300 mg via INTRAVENOUS

## 2018-03-27 MED ORDER — PROMETHAZINE HCL 25 MG/ML IJ SOLN
6.2500 mg | INTRAMUSCULAR | Status: DC | PRN
  Administered 2018-03-27: 6.25 mg via INTRAVENOUS

## 2018-03-27 MED ORDER — LIDOCAINE 2% (20 MG/ML) 5 ML SYRINGE
INTRAMUSCULAR | Status: DC | PRN
  Administered 2018-03-27: 70 mg via INTRAVENOUS

## 2018-03-27 MED ORDER — DIPHENHYDRAMINE HCL 12.5 MG/5ML PO ELIX: 12.5000 mg | ORAL_SOLUTION | ORAL | Status: DC | PRN

## 2018-03-27 MED ORDER — PHENOL 1.4 % MT LIQD: 1.0000 | OROMUCOSAL | Status: DC | PRN

## 2018-03-27 MED ORDER — OXYCODONE HCL 5 MG PO TABS: 5.0000 mg | ORAL_TABLET | ORAL | 0 refills | Status: DC | PRN

## 2018-03-27 MED ORDER — OXYCODONE HCL 5 MG PO TABS
10.0000 mg | ORAL_TABLET | ORAL | Status: DC | PRN
  Administered 2018-03-27 – 2018-03-28 (×3): 15 mg via ORAL
  Administered 2018-03-28 – 2018-03-29 (×3): 10 mg via ORAL
  Filled 2018-03-27 (×5): qty 3

## 2018-03-27 MED ORDER — DEXAMETHASONE SODIUM PHOSPHATE 10 MG/ML IJ SOLN
INTRAMUSCULAR | Status: AC
  Filled 2018-03-27: qty 1

## 2018-03-27 MED ORDER — FENTANYL CITRATE (PF) 250 MCG/5ML IJ SOLN
INTRAMUSCULAR | Status: AC
  Filled 2018-03-27: qty 5

## 2018-03-27 MED ORDER — MENTHOL 3 MG MT LOZG: 1.0000 | LOZENGE | OROMUCOSAL | Status: DC | PRN

## 2018-03-27 MED ORDER — PROPOFOL 10 MG/ML IV BOLUS
INTRAVENOUS | Status: AC
  Filled 2018-03-27: qty 20

## 2018-03-27 MED ORDER — METOCLOPRAMIDE HCL 5 MG/ML IJ SOLN: 5.0000 mg | Freq: Three times a day (TID) | INTRAMUSCULAR | Status: DC | PRN

## 2018-03-27 MED ORDER — VANCOMYCIN HCL IN DEXTROSE 1-5 GM/200ML-% IV SOLN
1000.0000 mg | Freq: Two times a day (BID) | INTRAVENOUS | Status: AC
  Administered 2018-03-27: 1000 mg via INTRAVENOUS
  Filled 2018-03-27: qty 200

## 2018-03-27 MED ORDER — FENTANYL CITRATE (PF) 100 MCG/2ML IJ SOLN
INTRAMUSCULAR | Status: DC | PRN
  Administered 2018-03-27: 100 ug via INTRAVENOUS
  Administered 2018-03-27 (×2): 50 ug via INTRAVENOUS
  Administered 2018-03-27: 100 ug via INTRAVENOUS
  Administered 2018-03-27: 50 ug via INTRAVENOUS
  Administered 2018-03-27: 150 ug via INTRAVENOUS

## 2018-03-27 MED ORDER — CHLORHEXIDINE GLUCONATE 4 % EX LIQD: 60.0000 mL | Freq: Once | CUTANEOUS | Status: DC

## 2018-03-27 MED ORDER — HYDROMORPHONE HCL 1 MG/ML IJ SOLN
INTRAMUSCULAR | Status: AC
  Filled 2018-03-27: qty 1

## 2018-03-27 MED ORDER — RIVAROXABAN 10 MG PO TABS
10.0000 mg | ORAL_TABLET | Freq: Every day | ORAL | Status: DC
  Administered 2018-03-28 – 2018-03-29 (×2): 10 mg via ORAL
  Filled 2018-03-27 (×2): qty 1

## 2018-03-27 MED ORDER — BUPIVACAINE HCL (PF) 0.25 % IJ SOLN
INTRAMUSCULAR | Status: AC
  Filled 2018-03-27: qty 30

## 2018-03-27 MED ORDER — ZOLPIDEM TARTRATE 5 MG PO TABS
10.0000 mg | ORAL_TABLET | Freq: Every day | ORAL | Status: DC
  Administered 2018-03-27 – 2018-03-28 (×2): 10 mg via ORAL
  Filled 2018-03-27 (×2): qty 2

## 2018-03-27 MED ORDER — LABETALOL HCL 200 MG PO TABS
200.0000 mg | ORAL_TABLET | Freq: Two times a day (BID) | ORAL | Status: DC
  Administered 2018-03-27 – 2018-03-29 (×4): 200 mg via ORAL
  Filled 2018-03-27 (×4): qty 1

## 2018-03-27 MED ORDER — LACTATED RINGERS IV SOLN
INTRAVENOUS | Status: DC
  Administered 2018-03-27: 09:00:00 via INTRAVENOUS

## 2018-03-27 MED ORDER — MIDAZOLAM HCL 2 MG/2ML IJ SOLN
INTRAMUSCULAR | Status: AC
  Administered 2018-03-27: 2 mg via INTRAVENOUS
  Filled 2018-03-27: qty 2

## 2018-03-27 MED ORDER — MIDAZOLAM HCL 2 MG/2ML IJ SOLN
INTRAMUSCULAR | Status: AC
  Filled 2018-03-27: qty 2

## 2018-03-27 MED ORDER — ONDANSETRON HCL 4 MG PO TABS
4.0000 mg | ORAL_TABLET | Freq: Four times a day (QID) | ORAL | Status: DC | PRN
  Administered 2018-03-28: 4 mg via ORAL
  Filled 2018-03-27: qty 1

## 2018-03-27 MED ORDER — HYDROMORPHONE HCL 1 MG/ML IJ SOLN: 0.5000 mg | INTRAMUSCULAR | Status: DC | PRN

## 2018-03-27 MED ORDER — DOCUSATE SODIUM 100 MG PO CAPS
100.0000 mg | ORAL_CAPSULE | Freq: Two times a day (BID) | ORAL | Status: DC
  Administered 2018-03-27 – 2018-03-29 (×5): 100 mg via ORAL
  Filled 2018-03-27 (×5): qty 1

## 2018-03-27 MED ORDER — 0.9 % SODIUM CHLORIDE (POUR BTL) OPTIME
TOPICAL | Status: DC | PRN
  Administered 2018-03-27: 1000 mL

## 2018-03-27 MED ORDER — METOCLOPRAMIDE HCL 5 MG PO TABS: 5.0000 mg | ORAL_TABLET | Freq: Three times a day (TID) | ORAL | Status: DC | PRN

## 2018-03-27 MED ORDER — POTASSIUM CHLORIDE IN NACL 20-0.45 MEQ/L-% IV SOLN
INTRAVENOUS | Status: DC
  Administered 2018-03-27: 17:00:00 via INTRAVENOUS
  Filled 2018-03-27 (×2): qty 1000

## 2018-03-27 MED ORDER — SUGAMMADEX SODIUM 500 MG/5ML IV SOLN
INTRAVENOUS | Status: AC
  Filled 2018-03-27: qty 5

## 2018-03-27 MED ORDER — HYDROMORPHONE HCL 1 MG/ML IJ SOLN
0.2500 mg | INTRAMUSCULAR | Status: DC | PRN
  Administered 2018-03-27 (×2): 0.5 mg via INTRAVENOUS

## 2018-03-27 MED ORDER — METHOCARBAMOL 500 MG PO TABS
500.0000 mg | ORAL_TABLET | Freq: Four times a day (QID) | ORAL | Status: DC | PRN
  Administered 2018-03-27 – 2018-03-29 (×5): 500 mg via ORAL
  Filled 2018-03-27 (×5): qty 1

## 2018-03-27 MED ORDER — LIDOCAINE HCL (CARDIAC) 20 MG/ML IV SOLN
INTRAVENOUS | Status: AC
  Filled 2018-03-27: qty 5

## 2018-03-27 MED ORDER — PROMETHAZINE HCL 25 MG/ML IJ SOLN
INTRAMUSCULAR | Status: AC
  Filled 2018-03-27: qty 1

## 2018-03-27 MED ORDER — MIDAZOLAM HCL 2 MG/2ML IJ SOLN
2.0000 mg | Freq: Once | INTRAMUSCULAR | Status: AC
  Administered 2018-03-27: 2 mg via INTRAVENOUS

## 2018-03-27 MED ORDER — ALUM & MAG HYDROXIDE-SIMETH 200-200-20 MG/5ML PO SUSP: 30.0000 mL | ORAL | Status: DC | PRN

## 2018-03-27 MED ORDER — POLYETHYLENE GLYCOL 3350 17 G PO PACK: 17.0000 g | PACK | Freq: Every day | ORAL | Status: DC | PRN

## 2018-03-27 MED ORDER — ONDANSETRON HCL 4 MG PO TABS: 4.0000 mg | ORAL_TABLET | Freq: Three times a day (TID) | ORAL | 0 refills | Status: AC | PRN

## 2018-03-27 MED ORDER — DEXAMETHASONE SODIUM PHOSPHATE 10 MG/ML IJ SOLN
INTRAMUSCULAR | Status: DC | PRN
  Administered 2018-03-27: 10 mg via INTRAVENOUS

## 2018-03-27 MED ORDER — HYDROCHLOROTHIAZIDE 25 MG PO TABS
25.0000 mg | ORAL_TABLET | Freq: Every day | ORAL | Status: DC
  Administered 2018-03-29: 25 mg via ORAL
  Filled 2018-03-27 (×2): qty 1

## 2018-03-27 MED ORDER — MAGNESIUM CITRATE PO SOLN
1.0000 | Freq: Once | ORAL | Status: DC | PRN
Start: 2018-03-27 — End: 2018-03-29

## 2018-03-27 MED ORDER — ROPIVACAINE HCL 7.5 MG/ML IJ SOLN
INTRAMUSCULAR | Status: DC | PRN
  Administered 2018-03-27: 20 mL via PERINEURAL

## 2018-03-27 MED ORDER — ACETAMINOPHEN 325 MG PO TABS: 325.0000 mg | ORAL_TABLET | Freq: Four times a day (QID) | ORAL | Status: DC | PRN

## 2018-03-27 MED ORDER — PROPOFOL 10 MG/ML IV BOLUS
INTRAVENOUS | Status: DC | PRN
  Administered 2018-03-27: 200 mg via INTRAVENOUS

## 2018-03-27 MED ORDER — BISACODYL 10 MG RE SUPP: 10.0000 mg | Freq: Every day | RECTAL | Status: DC | PRN

## 2018-03-27 MED ORDER — SENNA-DOCUSATE SODIUM 8.6-50 MG PO TABS: 2.0000 | ORAL_TABLET | Freq: Every day | ORAL | 1 refills | Status: DC

## 2018-03-27 MED ORDER — ROCURONIUM BROMIDE 10 MG/ML (PF) SYRINGE
PREFILLED_SYRINGE | INTRAVENOUS | Status: DC | PRN
  Administered 2018-03-27: 30 mg via INTRAVENOUS
  Administered 2018-03-27: 20 mg via INTRAVENOUS
  Administered 2018-03-27: 10 mg via INTRAVENOUS
  Administered 2018-03-27: 20 mg via INTRAVENOUS
  Administered 2018-03-27: 50 mg via INTRAVENOUS

## 2018-03-27 MED ORDER — RIVAROXABAN 10 MG PO TABS: 10.0000 mg | ORAL_TABLET | Freq: Every day | ORAL | 0 refills | Status: DC

## 2018-03-27 MED ORDER — METFORMIN HCL 500 MG PO TABS
500.0000 mg | ORAL_TABLET | Freq: Two times a day (BID) | ORAL | Status: DC
  Administered 2018-03-28 – 2018-03-29 (×3): 500 mg via ORAL
  Filled 2018-03-27 (×3): qty 1

## 2018-03-27 MED ORDER — DILTIAZEM HCL ER COATED BEADS 180 MG PO CP24
300.0000 mg | ORAL_CAPSULE | Freq: Every day | ORAL | Status: DC
  Administered 2018-03-27 – 2018-03-28 (×2): 300 mg via ORAL
  Filled 2018-03-27 (×2): qty 1

## 2018-03-27 MED ORDER — OXYCODONE HCL 5 MG PO TABS
5.0000 mg | ORAL_TABLET | ORAL | Status: DC | PRN
  Administered 2018-03-27 – 2018-03-28 (×2): 10 mg via ORAL
  Filled 2018-03-27 (×3): qty 2

## 2018-03-27 MED ORDER — ZOLPIDEM TARTRATE 5 MG PO TABS: 5.0000 mg | ORAL_TABLET | Freq: Every evening | ORAL | Status: DC | PRN

## 2018-03-27 MED ORDER — SODIUM CHLORIDE 0.9 % IR SOLN
Status: DC | PRN
  Administered 2018-03-27: 1000 mL

## 2018-03-27 MED ORDER — ACETAMINOPHEN 500 MG PO TABS
1000.0000 mg | ORAL_TABLET | Freq: Four times a day (QID) | ORAL | Status: AC
  Administered 2018-03-27 – 2018-03-28 (×4): 1000 mg via ORAL
  Filled 2018-03-27 (×4): qty 2

## 2018-03-27 MED ORDER — ONDANSETRON HCL 4 MG/2ML IJ SOLN
INTRAMUSCULAR | Status: DC | PRN
  Administered 2018-03-27: 4 mg via INTRAVENOUS

## 2018-03-27 MED ORDER — BACLOFEN 10 MG PO TABS: 10.0000 mg | ORAL_TABLET | Freq: Three times a day (TID) | ORAL | 0 refills | Status: DC

## 2018-03-27 MED ORDER — ONDANSETRON HCL 4 MG/2ML IJ SOLN
INTRAMUSCULAR | Status: AC
  Filled 2018-03-27: qty 2

## 2018-03-27 SURGICAL SUPPLY — 55 items
BANDAGE ACE 6X5 VEL STRL LF (GAUZE/BANDAGES/DRESSINGS) ×1 IMPLANT
BANDAGE ESMARK 6X9 LF (GAUZE/BANDAGES/DRESSINGS) ×1 IMPLANT
BEARING MENISCAL TIBIAL 4 LG L (Orthopedic Implant) ×1 IMPLANT
BNDG CMPR 9X6 STRL LF SNTH (GAUZE/BANDAGES/DRESSINGS) ×1
BNDG CMPR MED 15X6 ELC VLCR LF (GAUZE/BANDAGES/DRESSINGS) ×1
BNDG ELASTIC 6X15 VLCR STRL LF (GAUZE/BANDAGES/DRESSINGS) ×2 IMPLANT
BNDG ESMARK 6X9 LF (GAUZE/BANDAGES/DRESSINGS) ×2
BOWL SMART MIX CTS (DISPOSABLE) ×2 IMPLANT
BRNG TIB B UNCMP STRL LM/RL (Joint) ×1 IMPLANT
BRNG TIB LRG 4 PHS 3 LT MEN (Orthopedic Implant) ×1 IMPLANT
CEMENT BONE R 1X40 (Cement) ×1 IMPLANT
CLSR STERI-STRIP ANTIMIC 1/2X4 (GAUZE/BANDAGES/DRESSINGS) ×2 IMPLANT
COVER SURGICAL LIGHT HANDLE (MISCELLANEOUS) ×2 IMPLANT
CUFF TOURNIQUET SINGLE 34IN LL (TOURNIQUET CUFF) ×2 IMPLANT
DRAPE EXTREMITY T 121X128X90 (DRAPE) IMPLANT
DRAPE HALF SHEET 40X57 (DRAPES) ×2 IMPLANT
DRAPE U-SHAPE 47X51 STRL (DRAPES) ×2 IMPLANT
DRSG MEPILEX BORDER 4X8 (GAUZE/BANDAGES/DRESSINGS) ×2 IMPLANT
DURAPREP 26ML APPLICATOR (WOUND CARE) ×2 IMPLANT
ELECT CAUTERY BLADE 6.4 (BLADE) ×2 IMPLANT
ELECT REM PT RETURN 9FT ADLT (ELECTROSURGICAL) ×2
ELECTRODE REM PT RTRN 9FT ADLT (ELECTROSURGICAL) ×1 IMPLANT
GLOVE BIOGEL PI ORTHO PRO SZ8 (GLOVE) ×2
GLOVE ORTHO TXT STRL SZ7.5 (GLOVE) ×2 IMPLANT
GLOVE PI ORTHO PRO STRL SZ8 (GLOVE) ×2 IMPLANT
GLOVE SURG ORTHO 8.0 STRL STRW (GLOVE) ×2 IMPLANT
GOWN STRL REUS W/ TWL XL LVL3 (GOWN DISPOSABLE) ×1 IMPLANT
GOWN STRL REUS W/TWL 2XL LVL3 (GOWN DISPOSABLE) ×2 IMPLANT
GOWN STRL REUS W/TWL XL LVL3 (GOWN DISPOSABLE) ×8
HANDPIECE INTERPULSE COAX TIP (DISPOSABLE) ×2
HOOD PEEL AWAY FACE SHEILD DIS (HOOD) ×4 IMPLANT
HOOD PEEL AWAY FLYTE STAYCOOL (MISCELLANEOUS) ×2 IMPLANT
IMMOBILIZER KNEE 22 UNIV (SOFTGOODS) ×2 IMPLANT
INSERT TIBIAL OXFORD SZ B LF (Joint) ×1 IMPLANT
KIT BASIN OR (CUSTOM PROCEDURE TRAY) ×2 IMPLANT
KIT TURNOVER KIT B (KITS) ×2 IMPLANT
MANIFOLD NEPTUNE II (INSTRUMENTS) ×2 IMPLANT
NDL HYPO 21X1.5 SAFETY (NEEDLE) IMPLANT
NEEDLE HYPO 21X1.5 SAFETY (NEEDLE) ×2 IMPLANT
NS IRRIG 1000ML POUR BTL (IV SOLUTION) ×2 IMPLANT
PACK BLADE SAW RECIP 70 3 PT (BLADE) ×1 IMPLANT
PACK TOTAL JOINT (CUSTOM PROCEDURE TRAY) ×2 IMPLANT
PAD ARMBOARD 7.5X6 YLW CONV (MISCELLANEOUS) ×4 IMPLANT
PEG FEMORAL CEMENT STRL LRG (Knees) ×1 IMPLANT
SET HNDPC FAN SPRY TIP SCT (DISPOSABLE) ×1 IMPLANT
STRIP CLOSURE SKIN 1/2X4 (GAUZE/BANDAGES/DRESSINGS) ×2 IMPLANT
SUCTION FRAZIER HANDLE 10FR (MISCELLANEOUS) ×1
SUCTION TUBE FRAZIER 10FR DISP (MISCELLANEOUS) ×1 IMPLANT
SUT VIC AB 0 CT1 27 (SUTURE) ×2
SUT VIC AB 0 CT1 27XBRD ANBCTR (SUTURE) ×1 IMPLANT
SUT VIC AB 1 CT1 27 (SUTURE) ×2
SUT VIC AB 1 CT1 27XBRD ANBCTR (SUTURE) ×1 IMPLANT
SUT VIC AB 3-0 SH 8-18 (SUTURE) ×2 IMPLANT
SYR CONTROL 10ML LL (SYRINGE) ×1 IMPLANT
TOWEL OR 17X26 10 PK STRL BLUE (TOWEL DISPOSABLE) ×2 IMPLANT

## 2018-03-27 NOTE — Transfer of Care (Signed)
Immediate Anesthesia Transfer of Care Note  Patient: Nicholas Delacruz  Procedure(s) Performed: UNICOMPARTMENTAL LEFT KNEE (Left Knee)  Patient Location: PACU  Anesthesia Type:General  Level of Consciousness: awake, oriented and patient cooperative  Airway & Oxygen Therapy: Patient Spontanous Breathing and Patient connected to nasal cannula oxygen  Post-op Assessment: Report given to RN, Post -op Vital signs reviewed and stable and Patient moving all extremities  Post vital signs: Reviewed and stable  Last Vitals:  Vitals Value Taken Time  BP 136/71 03/27/2018 12:53 PM  Temp    Pulse 65 03/27/2018 12:54 PM  Resp 10 03/27/2018 12:54 PM  SpO2 87 % 03/27/2018 12:54 PM  Vitals shown include unvalidated device data.  Last Pain:  Vitals:   03/27/18 0906  TempSrc: Oral  PainSc:       Patients Stated Pain Goal: 3 (57/50/51 8335)  Complications: No apparent anesthesia complications

## 2018-03-27 NOTE — Anesthesia Preprocedure Evaluation (Addendum)
Anesthesia Evaluation  Patient identified by MRN, date of birth, ID band Patient awake    Reviewed: Allergy & Precautions, NPO status , Patient's Chart, lab work & pertinent test results  Airway Mallampati: II  TM Distance: >3 FB Neck ROM: Full    Dental no notable dental hx.    Pulmonary sleep apnea ,    Pulmonary exam normal breath sounds clear to auscultation       Cardiovascular hypertension, Normal cardiovascular exam Rhythm:Regular Rate:Normal     Neuro/Psych negative neurological ROS  negative psych ROS   GI/Hepatic Neg liver ROS, GERD  ,  Endo/Other  diabetesMorbid obesity  Renal/GU negative Renal ROS  negative genitourinary   Musculoskeletal  (+) Arthritis , Rheumatoid disorders,    Abdominal   Peds negative pediatric ROS (+)  Hematology negative hematology ROS (+)   Anesthesia Other Findings   Reproductive/Obstetrics negative OB ROS                             Anesthesia Physical Anesthesia Plan  ASA: III  Anesthesia Plan: General   Post-op Pain Management:  Regional for Post-op pain   Induction: Intravenous  PONV Risk Score and Plan: 2 and Ondansetron and Dexamethasone  Airway Management Planned: Oral ETT  Additional Equipment:   Intra-op Plan:   Post-operative Plan: Extubation in OR  Informed Consent: I have reviewed the patients History and Physical, chart, labs and discussed the procedure including the risks, benefits and alternatives for the proposed anesthesia with the patient or authorized representative who has indicated his/her understanding and acceptance.   Dental advisory given  Plan Discussed with: CRNA and Surgeon  Anesthesia Plan Comments:         Anesthesia Quick Evaluation

## 2018-03-27 NOTE — Progress Notes (Signed)
Informed social worker that family wanted to speak with her regarding families request to send patient to [facility] and note left in chart to Education officer, museum.    Cornell Barman, RN

## 2018-03-27 NOTE — Anesthesia Postprocedure Evaluation (Signed)
Anesthesia Post Note  Patient: Nicholas Delacruz  Procedure(s) Performed: UNICOMPARTMENTAL LEFT KNEE (Left Knee)     Patient location during evaluation: PACU Anesthesia Type: General Level of consciousness: awake and alert Pain management: pain level controlled Vital Signs Assessment: post-procedure vital signs reviewed and stable Respiratory status: spontaneous breathing, nonlabored ventilation, respiratory function stable and patient connected to nasal cannula oxygen Cardiovascular status: blood pressure returned to baseline and stable Postop Assessment: no apparent nausea or vomiting Anesthetic complications: no    Last Vitals:  Vitals:   03/27/18 1314 03/27/18 1336  BP:    Pulse: 63 64  Resp: 10 17  Temp:    SpO2: 95% 97%    Last Pain:  Vitals:   03/27/18 1336  TempSrc:   PainSc: 7                  Oskar Cretella S

## 2018-03-27 NOTE — Evaluation (Signed)
Physical Therapy Evaluation Patient Details Name: Nicholas Delacruz MRN: [id number] DOB: 04/07/1966 Today's Date: 03/27/2018   History of Present Illness  Pt is a 52 y/o male s/p elective L partial knee replacement. PMH includes DM, OSA on CPAP, and R TKA.   Clinical Impression  Pt is s/p surgery above with deficits below. Pt anxious after performing bed mobility as he reported increased dizziness, so mobility limited. Refused to sit at EOB to see if symptoms would resolve in sitting and quickly returned to supine after each attempt. Reviewed knee precautions and supine HEP. Pt also concerned about not having his bipap, so notified RN. Pt reports he will be going to SNF at d/c. Will continue to follow acutely to maximize functional mobility independence and safety.     Follow Up Recommendations Follow surgeon's recommendation for DC plan and follow-up therapies;Supervision for mobility/OOB    Equipment Recommendations  None recommended by PT    Recommendations for Other Services OT consult     Precautions / Restrictions Precautions Precautions: Knee Precaution Booklet Issued: Yes (comment) Precaution Comments: Reviewed precautions and supine HEP with pt.  Restrictions Weight Bearing Restrictions: Yes LLE Weight Bearing: Weight bearing as tolerated      Mobility  Bed Mobility Overal bed mobility: Needs Assistance Bed Mobility: Supine to Sit;Sit to Supine     Supine to sit: Supervision Sit to supine: Supervision   General bed mobility comments: Supervision for safety. Performed transition X 2. During first attempt, pt with increased dizziness, which caused increased anxiety and pt quickly lying back down. Educated about symptoms and performed again. Pt with increased dizziness on second attempt and refused to sit to see if symptoms would resolve and quickly returned to supine. Further mobility deferred.   Transfers                 General transfer comment: Deferred  secondary to dizziness.   Ambulation/Gait                Stairs            Wheelchair Mobility    Modified Rankin (Stroke Patients Only)       Balance Overall balance assessment: Needs assistance Sitting-balance support: Bilateral upper extremity supported;Feet supported Sitting balance-Leahy Scale: Poor Sitting balance - Comments: Reliant on BUE support secondary to dizziness.                                      Pertinent Vitals/Pain Pain Assessment: 0-10 Pain Score: 8  Pain Location: L knee  Pain Descriptors / Indicators: Aching;Constant;Operative site guarding Pain Intervention(s): Limited activity within patient's tolerance;Monitored during session;Repositioned    Home Living Family/patient expects to be discharged to:: Skilled nursing facility Living Arrangements: Alone                    Prior Function Level of Independence: Independent               Hand Dominance   Dominant Hand: Right    Extremity/Trunk Assessment   Upper Extremity Assessment Upper Extremity Assessment: Defer to OT evaluation    Lower Extremity Assessment Lower Extremity Assessment: LLE deficits/detail LLE Deficits / Details: REports decreased sensation. Able to perform ther ex below. Deficits consistent with post op pain and weakness.     Cervical / Trunk Assessment Cervical / Trunk Assessment: Normal  Communication   Communication: No difficulties  Cognition Arousal/Alertness: Awake/alert Behavior During Therapy: Anxious Overall Cognitive Status: Within Functional Limits for tasks assessed                                        General Comments General comments (skin integrity, edema, etc.): Pt's sister present in room. Pt very anxious about not having bipap in case he fell asleep. Notified RN, and RN reports bipap is ordered. Also notified RN that pt's food tray had not arrived.     Exercises Total Joint  Exercises Ankle Circles/Pumps: AROM;Both;20 reps Quad Sets: AROM;Left;10 reps Towel Squeeze: AROM;Both;10 reps Heel Slides: AAROM;Left;10 reps   Assessment/Plan    PT Assessment Patient needs continued PT services  PT Problem List Decreased strength;Decreased activity tolerance;Decreased balance;Decreased range of motion;Decreased mobility;Decreased knowledge of use of DME;Decreased knowledge of precautions;Pain       PT Treatment Interventions DME instruction;Gait training;Functional mobility training;Therapeutic activities;Therapeutic exercise;Balance training;Neuromuscular re-education;Patient/family education    PT Goals (Current goals can be found in the Care Plan section)  Acute Rehab PT Goals Patient Stated Goal: to stop being dizzy PT Goal Formulation: With patient Time For Goal Achievement: 04/10/18 Potential to Achieve Goals: Good    Frequency 7X/week   Barriers to discharge Decreased caregiver support      Co-evaluation               AM-PAC PT "6 Clicks" Daily Activity  Outcome Measure Difficulty turning over in bed (including adjusting bedclothes, sheets and blankets)?: A Little Difficulty moving from lying on back to sitting on the side of the bed? : A Little Difficulty sitting down on and standing up from a chair with arms (e.g., wheelchair, bedside commode, etc,.)?: Unable Help needed moving to and from a bed to chair (including a wheelchair)?: A Lot Help needed walking in hospital room?: A Lot Help needed climbing 3-5 steps with a railing? : Total 6 Click Score: 12    End of Session Equipment Utilized During Treatment: Gait belt Activity Tolerance: Treatment limited secondary to medical complications (Comment)(dizziness ) Patient left: in bed;with call bell/phone within reach;with family/visitor present Nurse Communication: Mobility status;Other (comment)(pt requesting bipap) PT Visit Diagnosis: Other abnormalities of gait and mobility  (R26.89);Pain Pain - Right/Left: Left Pain - part of body: Knee    Time: 8527-7824 PT Time Calculation (min) (ACUTE ONLY): 25 min   Charges:   PT Evaluation $PT Eval Moderate Complexity: 1 Mod PT Treatments $Therapeutic Activity: 8-22 mins   PT G Codes:        Leighton Ruff, PT, DPT  Acute Rehabilitation Services  Pager: [phone]   Rudean Hitt 03/27/2018, 6:24 PM

## 2018-03-27 NOTE — Anesthesia Procedure Notes (Signed)
Anesthesia Regional Block: Adductor canal block   Pre-Anesthetic Checklist: ,, timeout performed, Correct Patient, Correct Site, Correct Laterality, Correct Procedure, Correct Position, site marked, Risks and benefits discussed,  Surgical consent,  Pre-op evaluation,  At surgeon's request and post-op pain management  Laterality: Left  Prep: chloraprep       Needles:  Injection technique: Single-shot  Needle Type: Echogenic Needle     Needle Length: 9cm      Additional Needles:   Procedures:,,,, ultrasound used (permanent image in chart),,,,  Narrative:  Start time: 03/27/2018 10:01 AM End time: 03/27/2018 10:10 AM Injection made incrementally with aspirations every 5 mL.  Performed by: Personally  Anesthesiologist: Myrtie Soman, MD  Additional Notes: Patient tolerated the procedure well without complications

## 2018-03-27 NOTE — Op Note (Signed)
03/27/2018  12:52 PM  PATIENT:  Nicholas Delacruz    PRE-OPERATIVE DIAGNOSIS: Left knee anteromedial osteoarthritis  POST-OPERATIVE DIAGNOSIS:  Same  PROCEDURE:  Unicompartmental Knee Arthroplasty  SURGEON:  Johnny Bridge, MD  PHYSICIAN ASSISTANT: Joya Gaskins, OPA-C, present and scrubbed throughout the case, critical for completion in a timely fashion, and for retraction, instrumentation, and closure.  ANESTHESIA:   General  ESTIMATED BLOOD LOSS: 100 mL  UNIQUE ASPECTS OF THE CASE: The MCL was fairly contracted.  I had to cut the tibia twice, first with a 2 mm shim and then with the neutral shim.  The tibial component matched medial to lateral, although it was slightly undersized front to back.  If I had gone bigger however it would have overhung medially.  I did have to impact the intramedullary guide with a mallet, it did not want to go by hand, although I felt like I was in the correct line.  PREOPERATIVE INDICATIONS:  Nicholas Delacruz is a  52 y.o. male with a diagnosis of djd left knee who failed conservative measures and elected for surgical management.  He has had a previous contralateral total knee replacement and ultimately did well although had a challenging postop recovery.  He also has coexisting morbid obesity with a BMI of approximately 44.Estimated body mass index is 44.21 kg/m as calculated from the following:   Height as of 03/21/18: 6' (1.829 m).   Weight as of this encounter: 147.9 kg (326 lb).   The risks benefits and alternatives were discussed with the patient preoperatively including but not limited to the risks of infection, bleeding, nerve injury, cardiopulmonary complications, blood clots, the need for revision surgery, among others, and the patient was willing to proceed.  OPERATIVE IMPLANTS: Biomet [place] mobile bearing medial compartment arthroplasty femur size large, tibia size B, bearing size 4.  OPERATIVE FINDINGS: Endstage grade 4 medial compartment  osteoarthritis.  There was extensive erosive changes on both the tibia and the femur.  The patellofemoral joint had extensive grade 2 changes in the trochlea, although no exposed bone.  The lateral compartment looked excellent with the exception of a small 0.75 x 0.75 cm area of grade 3 changes, although this was isolated, whereas the medial compartment was completely eburnated and this is definitely the source of the disease.  The ACL was intact.  OPERATIVE PROCEDURE: The patient was brought to the operating room placed in the supine position. General anesthesia was administered. IV antibiotics were given with both Ancef and vancomycin given his preoperative positive MRSA status. The lower extremity was placed in the legholder and prepped and draped in usual sterile fashion.  Time out was performed.  The leg was elevated and exsanguinated and the tourniquet was inflated. Anteromedial incision was performed, and I took care to preserve the MCL. Parapatellar incision was carried out, and the osteophytes were excised, along with the medial meniscus and a small portion of the fat pad.  The extra medullary tibial cutting jig was applied, using the spoon and the 99mm G-Clamp and the 2 mm shim, and I took care to protect the anterior cruciate ligament insertion and the tibial spine. The medial collateral ligament was also protected, and I resected my proximal tibia, matching the anatomic slope.   The proximal tibial bony cut was removed in one piece, and I turned my attention to the femur.  The intramedullary femoral rod was placed using the drill, and I had to use a mallet, and then using the appropriate  reference, I assembled the femoral jig, setting my posterior cutting block. I resected my posterior femur, used the 0 spigot for the anterior femur, and then measured my gap.   I then used the appropriate mill to match the extension gap to the flexion gap.  With the trials in place, the flexion gap was still  fairly tight, only a 1 or 2 at most, and so I went back and cut the tibia again removing another 2 mm.  I then replaced the trials, and the second milling was at a 3.  The gaps were then measured again with the appropriate feeler gauges. Once I had balanced flexion and extension gaps, I then completed the preparation of the femur.  I milled off the anterior aspect of the distal femur to prevent impingement. I also exposed the tibia, and selected the above-named component, and then used the cutting jig to prepare the keel slot on the tibia. I also used the awl to curette out the bone to complete the preparation of the keel. The back wall was intact.  I then placed trial components, and it was found to have excellent motion, and appropriate balance.  I then cemented the components into place, cementing the tibia first, removing all excess cement, and then cementing the femur.  All loose cement was removed.  The real polyethylene insert was applied manually, and the knee was taken through functional range of motion, and found to have excellent stability and restoration of joint motion, with excellent balance.  The wounds were irrigated copiously, and the parapatellar tissue closed with Vicryl, followed by Vicryl for the subcutaneous tissue, with routine closure with Steri-Strips and sterile gauze.  The tourniquet was released, and the patient was awakened and extubated and returned to PACU in stable and satisfactory condition. There were no complications.

## 2018-03-27 NOTE — Anesthesia Procedure Notes (Signed)
Anesthesia Procedure Image    

## 2018-03-27 NOTE — Anesthesia Procedure Notes (Signed)
Procedure Name: Intubation Date/Time: 03/27/2018 10:43 AM Performed by: Moshe Salisbury, CRNA Pre-anesthesia Checklist: Patient identified, Emergency Drugs available, Suction available and Patient being monitored Patient Re-evaluated:Patient Re-evaluated prior to induction Oxygen Delivery Method: Circle System Utilized Preoxygenation: Pre-oxygenation with 100% oxygen Induction Type: IV induction Ventilation: Oral airway inserted - appropriate to patient size and Mask ventilation with difficulty Laryngoscope Size: Mac and 4 Grade View: Grade II Tube type: Oral Tube size: 7.5 mm Number of attempts: 1 Airway Equipment and Method: Stylet Placement Confirmation: ETT inserted through vocal cords under direct vision,  positive ETCO2 and breath sounds checked- equal and bilateral Secured at: 23 cm Tube secured with: Tape Dental Injury: Teeth and Oropharynx as per pre-operative assessment

## 2018-03-27 NOTE — H&P (Signed)
PREOPERATIVE H&P  Chief Complaint: Left knee pain  HPI: Nicholas Delacruz is a 52 y.o. male who presents for preoperative history and physical with a diagnosis of left knee primary localized osteoarthritis. Symptoms are rated as moderate to severe, and have been worsening.  This is significantly impairing activities of daily living.  He has elected for surgical management.   He has failed injections, activity modification, anti-inflammatories, and assistive devices.  Preoperative X-rays demonstrate end stage degenerative changes with osteophyte formation, loss of joint space, subchondral sclerosis.   Past Medical History:  Diagnosis Date  . Arthritis   . Diabetes mellitus without complication ([place])   . GERD (gastroesophageal reflux disease)   . High blood pressure   . Obstructive sleep apnea on CPAP    BIPAP  . PONV (postoperative nausea and vomiting)    blood pressure dropped in recovery   Past Surgical History:  Procedure Laterality Date  . APPENDECTOMY    . KNEE ARTHROPLASTY    . KNEE ARTHROSCOPY Right 01/02/2015   Procedure: RIGHT KNEE ARTHROSCOPY, LATERAL AND MEDIAL MENISECTOMY;  Surgeon: Carole Civil, MD;  Location: [facility];  Service: Orthopedics;  Laterality: Right;  . KNEE ARTHROSCOPY WITH MEDIAL MENISECTOMY Left 06/25/2014   Procedure: KNEE ARTHROSCOPY WITH MEDIAL MENISECTOMY;  Surgeon: Carole Civil, MD;  Location: [facility];  Service: Orthopedics;  Laterality: Left;  . KNEE SURGERY Left   . MENISCUS DEBRIDEMENT Right 01/02/2015   Procedure: DEBRIDEMENT OF RIGHT KNEE JOINT;  Surgeon: Carole Civil, MD;  Location: [facility];  Service: Orthopedics;  Laterality: Right;  . right knee sark    . SHOULDER SURGERY     left  . TOTAL KNEE ARTHROPLASTY Right 08/30/2016   Procedure: TOTAL KNEE ARTHROPLASTY;  Surgeon: Marchia Bond, MD;  Location: [facility];  Service: Orthopedics;  Laterality: Right;  . TOTAL SHOULDER ARTHROPLASTY     Social History   Socioeconomic History  .  Marital status: Divorced    Spouse name: Not on file  . Number of children: Not on file  . Years of education: 38 th grad  . Highest education level: Not on file  Occupational History  . Occupation: Pensions consultant and gam[employer]  Social Needs  . Financial resource strain: Not on file  . Food insecurity:    Worry: Not on file    Inability: Not on file  . Transportation needs:    Medical: Not on file    Non-medical: Not on file  Tobacco Use  . Smoking status: Never Smoker  . Smokeless tobacco: Never Used  Substance and Sexual Activity  . Alcohol use: Yes    Comment: socially  . Drug use: No  . Sexual activity: Yes    Birth control/protection: None  Lifestyle  . Physical activity:    Days per week: Not on file    Minutes per session: Not on file  . Stress: Not on file  Relationships  . Social connections:    Talks on phone: Not on file    Gets together: Not on file    Attends religious service: Not on file    Active member of club or organization: Not on file    Attends meetings of clubs or organizations: Not on file    Relationship status: Not on file  Other Topics Concern  . Not on file  Social History Narrative  . Not on file   Family History  Problem Relation Age of Onset  . Alzheimer's disease Father   . Arthritis  Unknown    Allergies  Allergen Reactions  . Levofloxacin Anaphylaxis   Prior to Admission medications   Medication Sig Start Date End Date Taking? Authorizing Provider  diltiazem (CARDIZEM CD) 300 MG 24 hr capsule Take 300 mg by mouth at bedtime.  05/25/16  Yes [provider]  labetalol (NORMODYNE) 200 MG tablet Take 200 mg by mouth 2 (two) times daily.  06/28/11  Yes [provider]  losartan-hydrochlorothiazide (HYZAAR) 100-25 MG tablet Take 1 tablet by mouth daily.  09/04/17  Yes [provider]  metFORMIN (GLUCOPHAGE) 500 MG tablet Take 500 mg by mouth 2 (two) times daily with a meal.   Yes [provider]  omeprazole  (PRILOSEC) 40 MG capsule Take 40 mg by mouth daily.  09/29/17  Yes [provider]  zolpidem (AMBIEN) 10 MG tablet Take 10 mg by mouth at bedtime.    Yes [provider]     Positive ROS: All other systems have been reviewed and were otherwise negative with the exception of those mentioned in the HPI and as above.  Physical Exam:  Estimated body mass index is 44.21 kg/m as calculated from the following:   Height as of 03/21/18: 6' (1.829 m).   Weight as of this encounter: 147.9 kg (326 lb).  General: Alert, no acute distress Cardiovascular: No pedal edema Respiratory: No cyanosis, no use of accessory musculature GI: No organomegaly, abdomen is soft and non-tender Skin: No lesions in the area of chief complaint Neurologic: Sensation intact distally Psychiatric: Patient is competent for consent with normal mood and affect Lymphatic: No axillary or cervical lymphadenopathy  MUSCULOSKELETAL: Left knee has positive pain over the medial joint line, positive crepitance, range of motion 0-115 degrees, positive pseudolaxity.  Assessment: Left knee primary localized osteoarthritis with coexisting morbid obesity.  Positive MRSA screen.   Plan: Plan for Procedure(s): UNICOMPARTMENTAL LEFT KNEE  The risks benefits and alternatives were discussed with the patient including but not limited to the risks of nonoperative treatment, versus surgical intervention including infection, bleeding, nerve injury,  blood clots, cardiopulmonary complications, morbidity, mortality, among others, and they were willing to proceed.  Plan for perioperative vancomycin as well as Ancef given positive MRSA screen.   Patient's anticipated LOS is less than 2 midnights, meeting these requirements: Unicompartmental knee replacement is observation procedure - Younger than 34 - Lives within 1 hour of care - Has a competent adult at home to recover with post-op recover - NO history of  - Chronic pain  requiring opiods  - Diabetes  - Coronary Artery Disease  - Heart failure  - Heart attack  - Stroke  - DVT/VTE  - Cardiac arrhythmia  - Respiratory Failure/COPD  - Renal failure  - Anemia  - Advanced Liver disease       Johnny Bridge, MD Cell [phone]   03/27/2018 9:45 AM

## 2018-03-28 ENCOUNTER — Other Ambulatory Visit: Payer: Self-pay

## 2018-03-28 ENCOUNTER — Encounter (HOSPITAL_COMMUNITY): Payer: Self-pay | Admitting: Orthopedic Surgery

## 2018-03-28 DIAGNOSIS — M1712 Unilateral primary osteoarthritis, left knee: Secondary | ICD-10-CM | POA: Diagnosis not present

## 2018-03-28 LAB — BASIC METABOLIC PANEL
Anion gap: 16 — ABNORMAL HIGH (ref 5–15)
BUN: 13 mg/dL (ref 6–20)
CO2: 20 mmol/L — ABNORMAL LOW (ref 22–32)
Calcium: 8.7 mg/dL — ABNORMAL LOW (ref 8.9–10.3)
Chloride: 98 mmol/L — ABNORMAL LOW (ref 101–111)
Creatinine, Ser: 0.99 mg/dL (ref 0.61–1.24)
GFR calc Af Amer: >60 mL/min (ref >60)
GFR calc non Af Amer: >60 mL/min (ref >60)
Glucose, Bld: 255 mg/dL — ABNORMAL HIGH (ref 65–99)
Potassium: 4 mmol/L (ref 3.5–5.1)
Sodium: 134 mmol/L — ABNORMAL LOW (ref 135–145)

## 2018-03-28 LAB — CBC
HCT: 35.3 % — ABNORMAL LOW (ref 39.0–52.0)
Hemoglobin: 11.5 g/dL — ABNORMAL LOW (ref 13.0–17.0)
MCH: 28.5 pg (ref 26.0–34.0)
MCHC: 32.6 g/dL (ref 30.0–36.0)
MCV: 87.6 fL (ref 78.0–100.0)
Platelets: 294 10*3/uL (ref 150–400)
RBC: 4.03 MIL/uL — ABNORMAL LOW (ref 4.22–5.81)
RDW: 14 % (ref 11.5–15.5)
WBC: 13 10*3/uL — ABNORMAL HIGH (ref 4.0–10.5)

## 2018-03-28 LAB — GLUCOSE, CAPILLARY
Glucose-Capillary: 243 mg/dL — ABNORMAL HIGH (ref 65–99)
Glucose-Capillary: 359 mg/dL — ABNORMAL HIGH (ref 65–99)
Glucose-Capillary: 323 mg/dL — ABNORMAL HIGH (ref 65–99)

## 2018-03-28 MED ORDER — INSULIN ASPART 100 UNIT/ML ~~LOC~~ SOLN
0.0000 [IU] | Freq: Every day | SUBCUTANEOUS | Status: DC
  Administered 2018-03-28: 4 [IU] via SUBCUTANEOUS

## 2018-03-28 MED ORDER — INSULIN ASPART 100 UNIT/ML ~~LOC~~ SOLN
0.0000 [IU] | Freq: Three times a day (TID) | SUBCUTANEOUS | Status: DC
  Administered 2018-03-28: 11 [IU] via SUBCUTANEOUS

## 2018-03-28 MED ORDER — INSULIN ASPART 100 UNIT/ML ~~LOC~~ SOLN
0.0000 [IU] | Freq: Three times a day (TID) | SUBCUTANEOUS | Status: DC
  Administered 2018-03-29: 3 [IU] via SUBCUTANEOUS
  Administered 2018-03-29: 5 [IU] via SUBCUTANEOUS

## 2018-03-28 NOTE — Progress Notes (Signed)
CPAP at bedside.  PT does not need RT assistance at this time.  PT places self on CPAP machine

## 2018-03-28 NOTE — Social Work (Addendum)
CSW met with sister and patient at bedside to discuss SNF offers. Admission staff at [facility] advised that they are out of network with patient's insurance as patient wld need to meet $1200 deductible and pay for 65% of facility stay.  CSW discussed other offers and provided to family. Pt interested in [place] [facility] [place] facility. CSW called admission staff at [facility] and left message as well as admission staff at [facility]. CSW f/u and provided them list of SNF's in [place] that have offered a SNF bed.  3:31pm- admission from [facility] called back and advised that patient has a high deductible even though they are in network and they need to make sure patient can afford.  CSW will f/u.  3:48pm: CSW explained out of pocket deductible and up front cost associated with going to any SNF's in network as they are the same for his Insurance Plan. Sister in agreement and will decide tomorrow if they will accept bed offer at [facility] and pay out of pocket cost/up front co-pay for admission to SNF.  CSW still f/u on bed offer from [facility] and will f/u again tomorrow.  Elissa Hefty, LCSW Clinical Social Worker [phone]

## 2018-03-28 NOTE — Evaluation (Signed)
Occupational Therapy Evaluation Patient Details Name: Nicholas Delacruz MRN: [id number] DOB: [birth year] Today's Date: 03/28/2018    History of Present Illness Pt is a 52 y/o male s/p elective L partial knee replacement. PMH includes DM, OSA on CPAP, and R TKA.    Clinical Impression   Patient presenting with decreased I in self care, balance, functional transfers/mobility, strength, and endurance. Patient reports being independent PTA. Patient currently functioning at min A overall with RW. Patient will benefit from acute OT to increase overall independence in the areas of ADLs, functional mobility,and balance in order to safely discharge to next venue of care. Pt requesting short term rehab stay secondary to him living alone with no family support for safety.    Follow Up Recommendations  SNF    Equipment Recommendations  Other (comment)(defer to next venue of care)    Recommendations for Other Services       Precautions / Restrictions Precautions Precautions: Knee Precaution Booklet Issued: Yes (comment) Precaution Comments: Reviewed precautions  Restrictions Weight Bearing Restrictions: Yes LLE Weight Bearing: Weight bearing as tolerated      Mobility Bed Mobility Overal bed mobility: Needs Assistance Bed Mobility: Supine to Sit;Sit to Supine     Supine to sit: Supervision Sit to supine: Supervision   General bed mobility comments: Pt continues to report dizziness with transitions but reports feeling ending  Transfers Overall transfer level: Needs assistance Equipment used: Rolling walker (2 wheeled) Transfers: Sit to/from [facility] Sit to Stand: Min guard Stand pivot transfers: Min guard       General transfer comment: steady assistance for balance with use of RW    Balance Overall balance assessment: Needs assistance Sitting-balance support: Bilateral upper extremity supported;Feet supported Sitting balance-Leahy Scale: Good     Standing  balance support: No upper extremity supported;During functional activity Standing balance-Leahy Scale: Fair Standing balance comment: steady assistance for balance or use of RW         ADL either performed or assessed with clinical judgement   ADL Overall ADL's : Needs assistance/impaired     Grooming: Wash/dry hands;Wash/dry face;Oral care;Standing;Min guard      Lower Body Dressing: Minimal assistance;Sit to/from stand      General ADL Comments: Pt sitting on EOB to don pull over shorts with min A for balance during clothing management. Pt ambulating 10' to bathroom and performing grooming tasks while standing with steady assistance for balance without UE support     Vision Baseline Vision/History: Wears glasses Wears Glasses: Reading only              Pertinent Vitals/Pain Pain Assessment: Faces Faces Pain Scale: Hurts even more Pain Location: L knee  Pain Descriptors / Indicators: Aching;Constant;Operative site guarding Pain Intervention(s): Limited activity within patient's tolerance;Monitored during session;Repositioned     Hand Dominance Right   Extremity/Trunk Assessment Upper Extremity Assessment Upper Extremity Assessment: Generalized weakness   Lower Extremity Assessment Lower Extremity Assessment: Defer to PT evaluation   Cervical / Trunk Assessment Cervical / Trunk Assessment: Normal   Communication Communication Communication: No difficulties   Cognition Arousal/Alertness: Awake/alert Behavior During Therapy: WFL for tasks assessed/performed Overall Cognitive Status: Within Functional Limits for tasks assessed                      Home Living Family/patient expects to be discharged to:: Skilled nursing facility Living Arrangements: Alone          Prior Functioning/Environment Level of Independence: Independent  OT Problem List: Decreased strength;Impaired balance (sitting and/or standing);Pain;Decreased  safety awareness;Decreased activity tolerance;Decreased knowledge of use of DME or AE      OT Treatment/Interventions: Self-care/ADL training;Manual therapy;Therapeutic exercise;Patient/family education;Neuromuscular education;Balance training;Energy conservation;Therapeutic activities;DME and/or AE instruction    OT Goals(Current goals can be found in the care plan section) Acute Rehab OT Goals Patient Stated Goal: to move better OT Goal Formulation: With patient Time For Goal Achievement: 04/11/18 Potential to Achieve Goals: Good ADL Goals Pt Will Perform Lower Body Bathing: with supervision Pt Will Perform Lower Body Dressing: with supervision Pt Will Transfer to Toilet: with supervision Pt Will Perform Toileting - Clothing Manipulation and hygiene: with supervision Pt Will Perform Tub/Shower Transfer: with supervision  OT Frequency: Min 2X/week   Barriers to D/C: Decreased caregiver support                End of Session Equipment Utilized During Treatment: Rolling walker Nurse Communication: Mobility status  Activity Tolerance: Patient tolerated treatment well Patient left: in chair;with call bell/phone within reach  OT Visit Diagnosis: Unsteadiness on feet (R26.81);Muscle weakness (generalized) (M62.81);Pain Pain - Right/Left: Left Pain - part of body: Knee                Time: 7510-2585 OT Time Calculation (min): 18 min Charges:  OT General Charges $OT Visit: 1 Visit OT Evaluation $OT Eval Moderate Complexity: 1 Mod  Walker Sitar P,MS, OTR/L, CBIS 03/28/2018, 12:21 PM

## 2018-03-28 NOTE — Progress Notes (Signed)
Inpatient Diabetes Program Recommendations  AACE/ADA: New Consensus Statement on Inpatient Glycemic Control (2015)  Target Ranges:  Prepandial:   less than 140 mg/dL      Peak postprandial:   less than 180 mg/dL (1-2 hours)      Critically ill patients:  140 - 180 mg/dL   Lab Results  Component Value Date   GLUCAP 243 (H) 03/28/2018   HGBA1C 6.8 (H) 03/21/2018    Review of Glycemic Control Results for Nicholas Delacruz, Nicholas Delacruz (MRN [id number]) as of 03/28/2018 09:24  Ref. Range 03/21/2018 15:06 03/27/2018 09:03 03/27/2018 12:57 03/28/2018 06:41  Glucose-Capillary Latest Ref Range: 65 - 99 mg/dL 119 (H) 161 (H) 206 (H) 243 (H)   Diabetes history: Type 2 DM Outpatient Diabetes medications: Metformin 500 mg BID Current orders for Inpatient glycemic control: Metformin 500 mg BID  Inpatient Diabetes Program Recommendations:    Noted discharge summary. However, if remains inpatient would consider changing diet to carb modified. Adding Novolog 0-9 units TIDAC in the setting of post steroid administration.   Thanks, Bronson Curb, MSN, RNC-OB Diabetes Coordinator [phone] (8a-5p)

## 2018-03-28 NOTE — Care Management (Signed)
52 yr old gentleman s/p left unicompartmental knee arthroplasty. Patient requesting to go to shortterm rehab. Social worker has been notified.

## 2018-03-28 NOTE — Discharge Summary (Signed)
Physician Discharge Summary  Patient ID: Nicholas Delacruz MRN: [id number] DOB/AGE: [birth year] 52 y.o.  Admit date: 03/27/2018 Discharge date: 03/28/2018  Admission Diagnoses:  Primary localized osteoarthritis of left knee  Discharge Diagnoses:  Principal Problem:   Primary localized osteoarthritis of left knee Active Problems:   S/P knee replacement   Past Medical History:  Diagnosis Date  . Arthritis   . Diabetes mellitus without complication ([place])   . GERD (gastroesophageal reflux disease)   . High blood pressure   . Obstructive sleep apnea on CPAP    BIPAP  . PONV (postoperative nausea and vomiting)    blood pressure dropped in recovery  . Primary localized osteoarthritis of left knee 03/27/2018    Surgeries: Procedure(s): UNICOMPARTMENTAL LEFT KNEE on 03/27/2018   Consultants (if any):   Discharged Condition: Improved  Hospital Course: Nicholas Delacruz is an 52 y.o. male who was admitted 03/27/2018 with a diagnosis of Primary localized osteoarthritis of left knee and went to the operating room on 03/27/2018 and underwent the above named procedures.    He was given perioperative antibiotics:  Anti-infectives (From admission, onward)   Start     Dose/Rate Route Frequency Ordered Stop   03/27/18 2200  vancomycin (VANCOCIN) IVPB 1000 mg/200 mL premix     1,000 mg 200 mL/hr over 60 Minutes Intravenous Every 12 hours 03/27/18 1525 03/27/18 2250   03/27/18 0900  ceFAZolin (ANCEF) 3 g in dextrose 5 % 50 mL IVPB     3 g 130 mL/hr over 30 Minutes Intravenous To ShortStay Surgical 03/26/18 1030 03/27/18 1052   03/27/18 0800  vancomycin (VANCOCIN) 1,500 mg in sodium chloride 0.9 % 500 mL IVPB     1,500 mg 500 mL/hr over 60 Minutes Intravenous To Surgery 03/26/18 1033 03/27/18 1025    .  He was given sequential compression devices, early ambulation, and xarelto for DVT prophylaxis.  He benefited maximally from the hospital stay and there were no complications.    Recent vital  signs:  Vitals:   03/28/18 0015 03/28/18 0645  BP: (!) 157/77 135/72  Pulse: 85 60  Resp: 19 17  Temp:  98 F (36.7 C)  SpO2: 96% 98%    Recent laboratory studies:  Lab Results  Component Value Date   HGB 11.5 (L) 03/28/2018   HGB 13.0 03/21/2018   HGB 11.0 (L) 09/02/2016   Lab Results  Component Value Date   WBC 13.0 (H) 03/28/2018   PLT 294 03/28/2018   No results found for: INR Lab Results  Component Value Date   NA 134 (L) 03/28/2018   K 4.0 03/28/2018   CL 98 (L) 03/28/2018   CO2 20 (L) 03/28/2018   BUN 13 03/28/2018   CREATININE 0.99 03/28/2018   GLUCOSE 255 (H) 03/28/2018    Discharge Medications:   Allergies as of 03/28/2018      Reactions   Levofloxacin Anaphylaxis      Medication List    TAKE these medications   AMBIEN 10 MG tablet Generic drug:  zolpidem Take 10 mg by mouth at bedtime.   baclofen 10 MG tablet Commonly known as:  LIORESAL Take 1 tablet (10 mg total) by mouth 3 (three) times daily. As needed for muscle spasm   diltiazem 300 MG 24 hr capsule Commonly known as:  CARDIZEM CD Take 300 mg by mouth at bedtime.   labetalol 200 MG tablet Commonly known as:  NORMODYNE Take 200 mg by mouth 2 (two) times daily.   losartan-hydrochlorothiazide  100-25 MG tablet Commonly known as:  HYZAAR Take 1 tablet by mouth daily.   metFORMIN 500 MG tablet Commonly known as:  GLUCOPHAGE Take 500 mg by mouth 2 (two) times daily with a meal.   omeprazole 40 MG capsule Commonly known as:  PRILOSEC Take 40 mg by mouth daily.   ondansetron 4 MG tablet Commonly known as:  ZOFRAN Take 1 tablet (4 mg total) by mouth every 8 (eight) hours as needed for nausea or vomiting.   oxyCODONE 5 MG immediate release tablet Commonly known as:  ROXICODONE Take 1 tablet (5 mg total) by mouth every 4 (four) hours as needed for severe pain.   rivaroxaban 10 MG Tabs tablet Commonly known as:  XARELTO Take 1 tablet (10 mg total) by mouth daily.    sennosides-docusate sodium 8.6-50 MG tablet Commonly known as:  SENOKOT-S Take 2 tablets by mouth daily.       Diagnostic Studies: Dg Knee Left Port  Result Date: 03/27/2018 CLINICAL DATA:  The patient has undergone left unicompartmental knee joint replacement. EXAM: PORTABLE LEFT KNEE - 1-2 VIEW COMPARISON:  Left knee series dated June 10, 2014 FINDINGS: The patient has undergone a unicompartmental left knee joint prosthesis placement. Radiographic positioning of the prosthetic components is good. The interface with the native bone appears normal. The lateral joint compartment and the patellofemoral compartments are grossly normal. There is fluid and air within the joint space. IMPRESSION: No immediate postprocedure complication following left medial knee joint replacement. Electronically Signed   By: David  Martinique M.D.   On: 03/27/2018 13:36    Disposition:     Follow-up Information    Marchia Bond, MD. Schedule an appointment as soon as possible for a visit in 2 weeks.   Specialty:  Orthopedic Surgery Contact information: [address] [place] [address] [phone]            Signed: Johnny Bridge 03/28/2018, 8:28 AM

## 2018-03-28 NOTE — Social Work (Signed)
CSW spoke with Seth Bake at [facility] and she indicated that she would review referral and let CSW know. SNF will need to get auth if they can offer SNF bed.  CSW will f/u.  Elissa Hefty, LCSW Clinical Social Worker [phone]

## 2018-03-28 NOTE — Progress Notes (Signed)
     Subjective:  Patient reports pain as moderate.  Has not done much getting out of bed yet.  Objective:   VITALS:   Vitals:   03/27/18 1527 03/27/18 1953 03/28/18 0015 03/28/18 0645  BP: (!) 143/72 (!) 142/66 (!) 157/77 135/72  Pulse: 67 75 85 60  Resp:  19 19 17   Temp: 97.8 F (36.6 C)   98 F (36.7 C)  TempSrc: Axillary   Oral  SpO2: 94% 95% 96% 98%  Weight:        Left lower extremity EHL and FHL are intact.  Sensation intact distally.  Knee immobilizer in place.  Lab Results  Component Value Date   WBC 13.0 (H) 03/28/2018   HGB 11.5 (L) 03/28/2018   HCT 35.3 (L) 03/28/2018   MCV 87.6 03/28/2018   PLT 294 03/28/2018   BMET    Component Value Date/Time   NA 134 (L) 03/28/2018 0516   K 4.0 03/28/2018 0516   CL 98 (L) 03/28/2018 0516   CO2 20 (L) 03/28/2018 0516   GLUCOSE 255 (H) 03/28/2018 0516   BUN 13 03/28/2018 0516   CREATININE 0.99 03/28/2018 0516   CALCIUM 8.7 (L) 03/28/2018 0516   GFRNONAA >60 03/28/2018 0516   GFRAA >60 03/28/2018 0516     Assessment/Plan: 1 Day Post-Op   Principal Problem:   Primary localized osteoarthritis of left knee Active Problems:   S/P knee replacement   Advance diet Up with therapy Discharge to SNF Patient says he does not have adequate home support and wants to be discharged to skilled nursing facility.  Consult written for social work.  Okay to go when bed available.   Patient's anticipated LOS is less than 2 midnights, meeting these requirements: - Younger than 76 - Lives within 1 hour of care - Has a competent adult at home to recover with post-op recover - NO history of  - Chronic pain requiring opiods  - Diabetes  - Coronary Artery Disease  - Heart failure  - Heart attack  - Stroke  - DVT/VTE  - Cardiac arrhythmia  - Respiratory Failure/COPD  - Renal failure  - Anemia  - Advanced Liver disease   Unicompartmental knee replacement typically is not an inpatient procedure, he does have risk  factors including his diabetes and morbid obesity, possible discharge to skilled nursing today.     Johnny Bridge 03/28/2018, 8:25 AM   Marchia Bond, MD Cell [phone]

## 2018-03-28 NOTE — Progress Notes (Signed)
Physical Therapy Treatment Patient Details Name: Nicholas Delacruz MRN: [id number] DOB: [birth year] Today's Date: 03/28/2018    History of Present Illness Pt is a 52 y/o male s/p elective L partial knee replacement. PMH includes DM, OSA on CPAP, and R TKA.     PT Comments    Patient continues to be unsafe to discharge home alone due to current inability to transfer and ambulate without assistance and will benefit from continued post-acute therapy. Patient with much improved transfers and mobility in comparison to initial evaluation yesterday. Currently recommending to follow surgeon's recommendation for DC plan and follow up therapies.    Follow Up Recommendations  Follow surgeon's recommendation for DC plan and follow-up therapies;Supervision for mobility/OOB     Equipment Recommendations  None recommended by PT    Recommendations for Other Services       Precautions / Restrictions Precautions Precautions: Knee Precaution Booklet Issued: Yes (comment) Precaution Comments: Reviewed precautions and supine and seated HEP with pt.  Restrictions Weight Bearing Restrictions: Yes LLE Weight Bearing: Weight bearing as tolerated    Mobility  Bed Mobility Overal bed mobility: Needs Assistance Bed Mobility: Supine to Sit;Sit to Supine     Supine to sit: Supervision Sit to supine: Supervision   General bed mobility comments: Patient in bathroom when therapist arrived.   Transfers Overall transfer level: Needs assistance Equipment used: Rolling walker (2 wheeled) Transfers: Sit to/from Stand Sit to Stand: Min guard Stand pivot transfers: Min guard       General transfer comment: steady assistance for balance with use of RW  Ambulation/Gait Ambulation/Gait assistance: Min guard Ambulation Distance (Feet): 150 Feet Assistive device: Rolling walker (2 wheeled) Gait Pattern/deviations: Step-through pattern;Decreased step length - right;Decreased stance time - left;Decreased  stride length;Decreased weight shift to left;Antalgic         Stairs            Wheelchair Mobility    Modified Rankin (Stroke Patients Only)       Balance Overall balance assessment: Needs assistance Sitting-balance support: No upper extremity supported Sitting balance-Leahy Scale: Fair     Standing balance support: Bilateral upper extremity supported;During functional activity Standing balance-Leahy Scale: Fair Standing balance comment: steady assistance for balance or use of RW                            Cognition Arousal/Alertness: Awake/alert Behavior During Therapy: WFL for tasks assessed/performed Overall Cognitive Status: Within Functional Limits for tasks assessed                                        Exercises Total Joint Exercises Ankle Circles/Pumps: AROM;Both;20 reps Quad Sets: AROM;Left;10 reps Towel Squeeze: AROM;Both;10 reps Short Arc Quad: AAROM;Strengthening;Left;10 reps Heel Slides: AAROM;Left;10 reps Hip ABduction/ADduction: AAROM;Strengthening;Left;10 reps Straight Leg Raises: AAROM;Strengthening;Left;10 reps Long Arc Quad: AAROM;Strengthening;Left;10 reps Knee Flexion: AAROM;Strengthening;Both;10 reps    General Comments        Pertinent Vitals/Pain Pain Assessment: Faces Faces Pain Scale: Hurts even more Pain Location: L knee  Pain Descriptors / Indicators: Aching;Constant;Operative site guarding Pain Intervention(s): Limited activity within patient's tolerance;Monitored during session    [place] expects to be discharged to:: Skilled nursing facility Living Arrangements: Alone                  Prior Function Level of Independence: Independent  PT Goals (current goals can now be found in the care plan section) Acute Rehab PT Goals Patient Stated Goal: to move better Progress towards PT goals: Progressing toward goals    Frequency    7X/week      PT  Plan      Co-evaluation              AM-PAC PT "6 Clicks" Daily Activity  Outcome Measure  Difficulty turning over in bed (including adjusting bedclothes, sheets and blankets)?: A Little Difficulty moving from lying on back to sitting on the side of the bed? : A Little Difficulty sitting down on and standing up from a chair with arms (e.g., wheelchair, bedside commode, etc,.)?: A Little Help needed moving to and from a bed to chair (including a wheelchair)?: A Little Help needed walking in hospital room?: A Little Help needed climbing 3-5 steps with a railing? : A Lot 6 Click Score: 17    End of Session Equipment Utilized During Treatment: Gait belt Activity Tolerance: Patient tolerated treatment well;Patient limited by fatigue Patient left: in chair;with call bell/phone within reach;with family/visitor present Nurse Communication: Mobility status PT Visit Diagnosis: Other abnormalities of gait and mobility (R26.89);Unsteadiness on feet (R26.81);Muscle weakness (generalized) (M62.81) Pain - Right/Left: Left Pain - part of body: Knee     Time: 3329-5188 PT Time Calculation (min) (ACUTE ONLY): 33 min  Charges:  $Gait Training: 8-22 mins $Therapeutic Exercise: 8-22 mins                    G Codes:       Evolette Pendell D. Hartnett-Rands, MS, PT Per [place] [phone] 03/28/2018, 1:12 PM

## 2018-03-28 NOTE — NC FL2 (Signed)
[place] LEVEL OF CARE SCREENING TOOL     IDENTIFICATION  Patient Name: Nicholas Delacruz Birthdate: 07/14/1966 Sex: male Admission Date (Current Location): 03/27/2018  [facility] and [place] Number:  [employer] and Address:  [facility] [place] [address] [place], [place] [address]      Provider Number: [id number]  Attending Physician Name and Address:  Marchia Bond, MD  Relative Name and Phone Number:  Leanna Battles, sister, [phone]     Current Level of Care: Hospital Recommended Level of Care: [place] Prior Approval Number:    Date Approved/Denied:   PASRR Number: [id number]  Discharge Plan: SNF    Current Diagnoses: Patient Active Problem List   Diagnosis Date Noted  . Primary localized osteoarthritis of left knee 03/27/2018  . S/P knee replacement 03/27/2018  . S/P total knee arthroplasty 08/30/2016  . Weakness of left leg 03/13/2014  . Difficulty in walking(719.7) 03/13/2014  . Arthritis of knee, left 01/21/2014  . Cervicalgia 11/08/2011  . RHEUMATOID ARTHRITIS 02/01/2011  . RUPTURE ROTATOR CUFF 02/01/2011  . IMPINGEMENT SYNDROME 12/07/2010  . Primary localized osteoarthritis of right knee 09/28/2009  . Essential hypertension 05/13/2009    Orientation RESPIRATION BLADDER Height & Weight     Self, Time, Situation, Place  Normal Continent Weight: (!) 326 lb (147.9 kg) Height:     BEHAVIORAL SYMPTOMS/MOOD NEUROLOGICAL BOWEL NUTRITION STATUS      Continent Diet(See DC Summary)  AMBULATORY STATUS COMMUNICATION OF NEEDS Skin   Extensive Assist Verbally Surgical wounds                       Personal Care Assistance Level of Assistance  Dressing, Feeding, Bathing Bathing Assistance: Limited assistance Feeding assistance: Independent Dressing Assistance: Limited assistance     Functional Limitations Info  Sight, Hearing, Speech Sight Info: Adequate Hearing Info: Adequate Speech  Info: Adequate    SPECIAL CARE FACTORS FREQUENCY  PT (By licensed PT), OT (By licensed OT)     PT Frequency: 5x week OT Frequency: 5x week            Contractures      Additional Factors Info  Code Status, Allergies, Isolation Precautions Code Status Info: Full  Allergies Info: LEVOFLOXACIN    Insulin Sliding Scale Info: N/A Isolation Precautions Info: MRSA     Current Medications (03/28/2018):  This is the current hospital active medication list Current Facility-Administered Medications  Medication Dose Route Frequency Provider Last Rate Last Dose  . 0.45 % NaCl with KCl 20 mEq / L infusion   Intravenous Continuous Marchia Bond, MD 75 mL/hr at 03/27/18 1713    . acetaminophen (TYLENOL) tablet 325-650 mg  325-650 mg Oral Q6H PRN Marchia Bond, MD      . alum & mag hydroxide-simeth (MAALOX/MYLANTA) 200-200-20 MG/5ML suspension 30 mL  30 mL Oral Q4H PRN Marchia Bond, MD      . bisacodyl (DULCOLAX) suppository 10 mg  10 mg Rectal Daily PRN Marchia Bond, MD      . diltiazem (CARDIZEM CD) 24 hr capsule 300 mg  300 mg Oral QHS Marchia Bond, MD   300 mg at 03/27/18 2156  . diphenhydrAMINE (BENADRYL) 12.5 MG/5ML elixir 12.5-25 mg  12.5-25 mg Oral Q4H PRN Marchia Bond, MD      . docusate sodium (COLACE) capsule 100 mg  100 mg Oral BID Marchia Bond, MD   100 mg at 03/28/18 0814  . hydrochlorothiazide (HYDRODIURIL) tablet  25 mg  25 mg Oral Daily Marchia Bond, MD      . HYDROmorphone (DILAUDID) injection 0.5-1 mg  0.5-1 mg Intravenous Q4H PRN Marchia Bond, MD      . labetalol (NORMODYNE) tablet 200 mg  200 mg Oral BID Marchia Bond, MD   200 mg at 03/28/18 1225  . losartan (COZAAR) tablet 100 mg  100 mg Oral Daily Marchia Bond, MD      . magnesium citrate solution 1 Bottle  1 Bottle Oral Once PRN Marchia Bond, MD      . menthol-cetylpyridinium (CEPACOL) lozenge 3 mg  1 lozenge Oral PRN Marchia Bond, MD       Or  . phenol (CHLORASEPTIC) mouth spray 1 spray  1 spray  Mouth/Throat PRN Marchia Bond, MD      . metFORMIN (GLUCOPHAGE) tablet 500 mg  500 mg Oral BID WC Marchia Bond, MD   500 mg at 03/28/18 0814  . methocarbamol (ROBAXIN) tablet 500 mg  500 mg Oral Q6H PRN Marchia Bond, MD   500 mg at 03/28/18 1224   Or  . methocarbamol (ROBAXIN) 500 mg in dextrose 5 % 50 mL IVPB  500 mg Intravenous Q6H PRN Marchia Bond, MD      . metoCLOPramide (REGLAN) tablet 5-10 mg  5-10 mg Oral Q8H PRN Marchia Bond, MD       Or  . metoCLOPramide (REGLAN) injection 5-10 mg  5-10 mg Intravenous Q8H PRN Marchia Bond, MD      . ondansetron [facility]) tablet 4 mg  4 mg Oral Q6H PRN Marchia Bond, MD   4 mg at 03/28/18 0815   Or  . ondansetron (ZOFRAN) injection 4 mg  4 mg Intravenous Q6H PRN Marchia Bond, MD   4 mg at 03/27/18 1617  . oxyCODONE (Oxy IR/ROXICODONE) immediate release tablet 10-15 mg  10-15 mg Oral Q4H PRN Marchia Bond, MD   15 mg at 03/28/18 1223  . oxyCODONE (Oxy IR/ROXICODONE) immediate release tablet 5-10 mg  5-10 mg Oral Q4H PRN Marchia Bond, MD   10 mg at 03/27/18 2156  . pantoprazole (PROTONIX) EC tablet 80 mg  80 mg Oral Daily Marchia Bond, MD   80 mg at 03/28/18 0815  . polyethylene glycol (MIRALAX / GLYCOLAX) packet 17 g  17 g Oral Daily PRN Marchia Bond, MD      . rivaroxaban [facility]) tablet 10 mg  10 mg Oral Q breakfast Marchia Bond, MD   10 mg at 03/28/18 0816  . zolpidem (AMBIEN) tablet 10 mg  10 mg Oral QHS Marchia Bond, MD   10 mg at 03/27/18 2157     Discharge Medications: Please see discharge summary for a list of discharge medications.  Relevant Imaging Results:  Relevant Lab Results:   Additional Information SS#: [id number]  [place], LCSW

## 2018-03-28 NOTE — Discharge Instructions (Signed)
INSTRUCTIONS AFTER JOINT REPLACEMENT  ° °o Remove items at home which could result in a fall. This includes throw rugs or furniture in walking pathways °o ICE to the affected joint every three hours while awake for 30 minutes at a time, for at least the first 3-5 days, and then as needed for pain and swelling.  Continue to use ice for pain and swelling. You may notice swelling that will progress down to the foot and ankle.  This is normal after surgery.  Elevate your leg when you are not up walking on it.   °o Continue to use the breathing machine you got in the hospital (incentive spirometer) which will help keep your temperature down.  It is common for your temperature to cycle up and down following surgery, especially at night when you are not up moving around and exerting yourself.  The breathing machine keeps your lungs expanded and your temperature down. ° ° °DIET:  As you were doing prior to hospitalization, we recommend a well-balanced diet. ° °DRESSING / WOUND CARE / SHOWERING ° °You may change your dressing 3-5 days after surgery.  Then change the dressing every day with sterile gauze.  Please use good hand washing techniques before changing the dressing.  Do not use any lotions or creams on the incision until instructed by your surgeon. ° °ACTIVITY ° °o Increase activity slowly as tolerated, but follow the weight bearing instructions below.   °o No driving for 6 weeks or until further direction given by your physician.  You cannot drive while taking narcotics.  °o No lifting or carrying greater than 10 lbs. until further directed by your surgeon. °o Avoid periods of inactivity such as sitting longer than an hour when not asleep. This helps prevent blood clots.  °o You may return to work once you are authorized by your doctor.  ° ° ° °WEIGHT BEARING  ° °Weight bearing as tolerated with assist device (walker, cane, etc) as directed, use it as long as suggested by your surgeon or therapist, typically at  least 4-6 weeks. ° ° °EXERCISES ° °Results after joint replacement surgery are often greatly improved when you follow the exercise, range of motion and muscle strengthening exercises prescribed by your doctor. Safety measures are also important to protect the joint from further injury. Any time any of these exercises cause you to have increased pain or swelling, decrease what you are doing until you are comfortable again and then slowly increase them. If you have problems or questions, call your caregiver or physical therapist for advice.  ° °Rehabilitation is important following a joint replacement. After just a few days of immobilization, the muscles of the leg can become weakened and shrink (atrophy).  These exercises are designed to build up the tone and strength of the thigh and leg muscles and to improve motion. Often times heat used for twenty to thirty minutes before working out will loosen up your tissues and help with improving the range of motion but do not use heat for the first two weeks following surgery (sometimes heat can increase post-operative swelling).  ° °These exercises can be done on a training (exercise) mat, on the floor, on a table or on a bed. Use whatever works the best and is most comfortable for you.    Use music or television while you are exercising so that the exercises are a pleasant break in your day. This will make your life better with the exercises acting as a break   in your routine that you can look forward to.   Perform all exercises about fifteen times, three times per day or as directed.  You should exercise both the operative leg and the other leg as well. ° °Exercises include: °  °• Quad Sets - Tighten up the muscle on the front of the thigh (Quad) and hold for 5-10 seconds.   °• Straight Leg Raises - With your knee straight (if you were given a brace, keep it on), lift the leg to 60 degrees, hold for 3 seconds, and slowly lower the leg.  Perform this exercise against  resistance later as your leg gets stronger.  °• Leg Slides: Lying on your back, slowly slide your foot toward your buttocks, bending your knee up off the floor (only go as far as is comfortable). Then slowly slide your foot back down until your leg is flat on the floor again.  °• Angel Wings: Lying on your back spread your legs to the side as far apart as you can without causing discomfort.  °• Hamstring Strength:  Lying on your back, push your heel against the floor with your leg straight by tightening up the muscles of your buttocks.  Repeat, but this time bend your knee to a comfortable angle, and push your heel against the floor.  You may put a pillow under the heel to make it more comfortable if necessary.  ° °A rehabilitation program following joint replacement surgery can speed recovery and prevent re-injury in the future due to weakened muscles. Contact your doctor or a physical therapist for more information on knee rehabilitation.  ° ° °CONSTIPATION ° °Constipation is defined medically as fewer than three stools per week and severe constipation as less than one stool per week.  Even if you have a regular bowel pattern at home, your normal regimen is likely to be disrupted due to multiple reasons following surgery.  Combination of anesthesia, postoperative narcotics, change in appetite and fluid intake all can affect your bowels.  ° °YOU MUST use at least one of the following options; they are listed in order of increasing strength to get the job done.  They are all available over the counter, and you may need to use some, POSSIBLY even all of these options:   ° °Drink plenty of fluids (prune juice may be helpful) and high fiber foods °Colace 100 mg by mouth twice a day  °Senokot for constipation as directed and as needed Dulcolax (bisacodyl), take with full glass of water  °Miralax (polyethylene glycol) once or twice a day as needed. ° °If you have tried all these things and are unable to have a bowel  movement in the first 3-4 days after surgery call either your surgeon or your primary doctor.   ° °If you experience loose stools or diarrhea, hold the medications until you stool forms back up.  If your symptoms do not get better within 1 week or if they get worse, check with your doctor.  If you experience "the worst abdominal pain ever" or develop nausea or vomiting, please contact the office immediately for further recommendations for treatment. ° ° °ITCHING:  If you experience itching with your medications, try taking only a single pain pill, or even half a pain pill at a time.  You can also use Benadryl over the counter for itching or also to help with sleep.  ° °TED HOSE STOCKINGS:  Use stockings on both legs until for at least 2 weeks or as   directed by physician office. They may be removed at night for sleeping. ° °MEDICATIONS:  See your medication summary on the “After Visit Summary” that nursing will review with you.  You may have some home medications which will be placed on hold until you complete the course of blood thinner medication.  It is important for you to complete the blood thinner medication as prescribed. ° °PRECAUTIONS:  If you experience chest pain or shortness of breath - call 911 immediately for transfer to the hospital emergency department.  ° °If you develop a fever greater that 101 F, purulent drainage from wound, increased redness or drainage from wound, foul odor from the wound/dressing, or calf pain - CONTACT YOUR SURGEON.   °                                                °FOLLOW-UP APPOINTMENTS:  If you do not already have a post-op appointment, please call the office for an appointment to be seen by your surgeon.  Guidelines for how soon to be seen are listed in your “After Visit Summary”, but are typically between 1-4 weeks after surgery. ° °OTHER INSTRUCTIONS:  ° °Knee Replacement:  Do not place pillow under knee, focus on keeping the knee straight while resting. CPM  instructions: 0-90 degrees, 2 hours in the morning, 2 hours in the afternoon, and 2 hours in the evening. Place foam block, curve side up under heel at all times except when in CPM or when walking.  DO NOT modify, tear, cut, or change the foam block in any way. ° °MAKE SURE YOU:  °• Understand these instructions.  °• Get help right away if you are not doing well or get worse.  ° ° °Thank you for letting us be a part of your medical care team.  It is a privilege we respect greatly.  We hope these instructions will help you stay on track for a fast and full recovery!  ° °Information on my medicine - XARELTO® (Rivaroxaban) ° ° °Why was Xarelto® prescribed for you? °Xarelto® was prescribed for you to reduce the risk of blood clots forming after orthopedic surgery. The medical term for these abnormal blood clots is venous thromboembolism (VTE). ° °What do you need to know about xarelto® ? °Take your Xarelto® ONCE DAILY at the same time every day. °You may take it either with or without food. ° °If you have difficulty swallowing the tablet whole, you may crush it and mix in applesauce just prior to taking your dose. ° °Take Xarelto® exactly as prescribed by your doctor and DO NOT stop taking Xarelto® without talking to the doctor who prescribed the medication.  Stopping without other VTE prevention medication to take the place of Xarelto® may increase your risk of developing a clot. ° °After discharge, you should have regular check-up appointments with your healthcare provider that is prescribing your Xarelto®.   ° °What do you do if you miss a dose? °If you miss a dose, take it as soon as you remember on the same day then continue your regularly scheduled once daily regimen the next day. Do not take two doses of Xarelto® on the same day.  ° °Important Safety Information °A possible side effect of Xarelto® is bleeding. You should call your healthcare provider right away if you experience any of the following: °?   Bleeding  from an injury or your nose that does not stop. °? Unusual colored urine (red or dark brown) or unusual colored stools (red or black). °? Unusual bruising for unknown reasons. °? A serious fall or if you hit your head (even if there is no bleeding). ° °Some medicines may interact with Xarelto® and might increase your risk of bleeding while on Xarelto®. To help avoid this, consult your healthcare provider or pharmacist prior to using any new prescription or non-prescription medications, including herbals, vitamins, non-steroidal anti-inflammatory drugs (NSAIDs) and supplements. ° °This website has more information on Xarelto®: www.xarelto.com. ° ° °

## 2018-03-28 NOTE — Clinical Social Work Note (Signed)
Clinical Social Work Assessment  Patient Details  Name: Nicholas Delacruz MRN: [id number] Date of Birth: 05-21-1966  Date of referral:  03/28/18               Reason for consult:  Facility Placement                Permission sought to share information with:  Facility Art therapist granted to share information::  Yes, Verbal Permission Granted  Name::     sister  Agency::  SNF  Relationship::     Contact Information:     Housing/Transportation Living arrangements for the past 2 months:  Apartment Source of Information:  Patient, Siblings Patient Interpreter Needed:  None Criminal Activity/Legal Involvement Pertinent to Current Situation/Hospitalization:  No - Comment as needed Significant Relationships:  Other Family Members, Siblings Lives with:  Self Do you feel safe going back to the place where you live?  No Need for family participation in patient care:  No (Coment)  Care giving concerns:  Pt has new impairment and resides alone.  Pt amenable to SNF at discharge.  Social Worker assessment / plan:  CSW met with patient at bedside along with sister Manuela Schwartz and discussed the disposition. CSW explained CSW role, SNF process/placement and insurance authorization needed for placement. CSW answered all questions. CSW obtained permission to send to SNF's in [place] and [place]. CSW will assist with disposition.  Employment status:  [employer] information:  Other (Comment Required)([employer]) PT Recommendations:  [place] / Referral to community resources:  [place]  Patient/Family's Response to care:  Patient/sister thanked CSW for meeting to discuss SNF placement and agreeable to placement at discharge.  Patient/Family's Understanding of and Emotional Response to Diagnosis, Current Treatment, and Prognosis:  Patient/sister has good understanding of impairment and diagnosis and are agreeable to SNF  at discharge. Pt hopes to return home to independence as soon as he improves with rehabilitation. CSW will f/u for disposition as SNF will need to get authorization. No issues or concerns.  Emotional Assessment Appearance:  Appears older than stated age Attitude/Demeanor/Rapport:  (Cooperation) Affect (typically observed):  Accepting, Appropriate Orientation:  Oriented to Self, Oriented to Place, Oriented to  Time, Oriented to Situation Alcohol / Substance use:  Not Applicable Psych involvement (Current and /or in the community):  No (Comment)  Discharge Needs  Concerns to be addressed:  Patient refuses services Readmission within the last 30 days:  No Current discharge risk:  Dependent with Mobility, Physical Impairment Barriers to Discharge:  No Barriers Identified   Normajean Baxter, LCSW 03/28/2018, 12:15 PM

## 2018-03-29 DIAGNOSIS — M1712 Unilateral primary osteoarthritis, left knee: Secondary | ICD-10-CM | POA: Diagnosis not present

## 2018-03-29 LAB — CBC
HCT: 34.7 % — ABNORMAL LOW (ref 39.0–52.0)
Hemoglobin: 11.1 g/dL — ABNORMAL LOW (ref 13.0–17.0)
MCH: 28.3 pg (ref 26.0–34.0)
MCHC: 32 g/dL (ref 30.0–36.0)
MCV: 88.5 fL (ref 78.0–100.0)
Platelets: 275 10*3/uL (ref 150–400)
RBC: 3.92 MIL/uL — ABNORMAL LOW (ref 4.22–5.81)
RDW: 14.2 % (ref 11.5–15.5)
WBC: 12.1 10*3/uL — ABNORMAL HIGH (ref 4.0–10.5)

## 2018-03-29 LAB — GLUCOSE, CAPILLARY
Glucose-Capillary: 310 mg/dL — ABNORMAL HIGH (ref 65–99)
Glucose-Capillary: 165 mg/dL — ABNORMAL HIGH (ref 65–99)
Glucose-Capillary: 222 mg/dL — ABNORMAL HIGH (ref 65–99)

## 2018-03-29 NOTE — Care Management Note (Signed)
Case Management Note  Patient Details  Name: JAMARIUS SAHA MRN: [id number] Date of Birth: 1966/03/26  Subjective/Objective:   52 yr old male s/p left unicompartmental knee arthroplasty.                 Action/Plan: Case manager spoke with patient and wife concerning discharge plan and DME. Choice was offered for [place], patient's wife said they want to use [facility]. Case manager called referral to Josie Dixon, [facility]. Patient has RW, says he doesn't need 3n1. He will  have family support at discharge.    Expected Discharge Date:  03/29/18               Expected Discharge Plan:  [place]  In-House Referral:  NA  Discharge planning Services  CM Consult  Post Acute Care Choice:  Home Health Choice offered to:  Patient, Spouse  DME Arranged:  (Has rw) DME Agency:  NA  HH Arranged:  PT [place] Agency:  [facility] (formerly [employer])  Status of Service:  Completed, signed off  If discussed at [employer], dates discussed:    Additional Comments:  Ninfa Meeker, RN 03/29/2018, 1:58 PM

## 2018-03-29 NOTE — Progress Notes (Signed)
Removed IV, provided discharge education/instructions, all questions and concerns addressed, Pt not in distress, discharged home with belongings accompanied by family member.

## 2018-03-29 NOTE — Discharge Summary (Signed)
Physician Discharge Summary  Patient ID: Nicholas Delacruz MRN: [id number] DOB/AGE: [birth year] 52 y.o.  Admit date: 03/27/2018 Discharge date: 03/29/2018  Admission Diagnoses:  Primary localized osteoarthritis of left knee  Discharge Diagnoses:  Principal Problem:   Primary localized osteoarthritis of left knee Active Problems:   S/P knee replacement   Past Medical History:  Diagnosis Date  . Arthritis   . Diabetes mellitus without complication ([place])   . GERD (gastroesophageal reflux disease)   . High blood pressure   . Obstructive sleep apnea on CPAP    BIPAP  . PONV (postoperative nausea and vomiting)    blood pressure dropped in recovery  . Primary localized osteoarthritis of left knee 03/27/2018    Surgeries: Procedure(s): UNICOMPARTMENTAL LEFT KNEE on 03/27/2018   Consultants (if any):   Discharged Condition: Improved  Hospital Course: Nicholas Delacruz is an 52 y.o. male who was admitted 03/27/2018 with a diagnosis of Primary localized osteoarthritis of left knee and went to the operating room on 03/27/2018 and underwent the above named procedures.    He was given perioperative antibiotics:  Anti-infectives (From admission, onward)   Start     Dose/Rate Route Frequency Ordered Stop   03/27/18 2200  vancomycin (VANCOCIN) IVPB 1000 mg/200 mL premix     1,000 mg 200 mL/hr over 60 Minutes Intravenous Every 12 hours 03/27/18 1525 03/27/18 2250   03/27/18 0900  ceFAZolin (ANCEF) 3 g in dextrose 5 % 50 mL IVPB     3 g 130 mL/hr over 30 Minutes Intravenous To ShortStay Surgical 03/26/18 1030 03/27/18 1052   03/27/18 0800  vancomycin (VANCOCIN) 1,500 mg in sodium chloride 0.9 % 500 mL IVPB     1,500 mg 500 mL/hr over 60 Minutes Intravenous To Surgery 03/26/18 1033 03/27/18 1025    .  He was given sequential compression devices, early ambulation, and xarelto for DVT prophylaxis.  He benefited maximally from the hospital stay and there were no complications.    Recent vital  signs:  Vitals:   03/28/18 2100 03/29/18 0620  BP: (!) 104/92 126/69  Pulse: 62 (!) 53  Resp: 18 18  Temp: 97.6 F (36.4 C) 98 F (36.7 C)  SpO2: 98% 99%    Recent laboratory studies:  Lab Results  Component Value Date   HGB 11.1 (L) 03/29/2018   HGB 11.5 (L) 03/28/2018   HGB 13.0 03/21/2018   Lab Results  Component Value Date   WBC 12.1 (H) 03/29/2018   PLT 275 03/29/2018   No results found for: INR Lab Results  Component Value Date   NA 134 (L) 03/28/2018   K 4.0 03/28/2018   CL 98 (L) 03/28/2018   CO2 20 (L) 03/28/2018   BUN 13 03/28/2018   CREATININE 0.99 03/28/2018   GLUCOSE 255 (H) 03/28/2018    Discharge Medications:   Allergies as of 03/29/2018      Reactions   Levofloxacin Anaphylaxis      Medication List    TAKE these medications   AMBIEN 10 MG tablet Generic drug:  zolpidem Take 10 mg by mouth at bedtime.   baclofen 10 MG tablet Commonly known as:  LIORESAL Take 1 tablet (10 mg total) by mouth 3 (three) times daily. As needed for muscle spasm   diltiazem 300 MG 24 hr capsule Commonly known as:  CARDIZEM CD Take 300 mg by mouth at bedtime.   labetalol 200 MG tablet Commonly known as:  NORMODYNE Take 200 mg by mouth 2 (two) times  daily.   losartan-hydrochlorothiazide 100-25 MG tablet Commonly known as:  HYZAAR Take 1 tablet by mouth daily.   metFORMIN 500 MG tablet Commonly known as:  GLUCOPHAGE Take 500 mg by mouth 2 (two) times daily with a meal.   omeprazole 40 MG capsule Commonly known as:  PRILOSEC Take 40 mg by mouth daily.   ondansetron 4 MG tablet Commonly known as:  ZOFRAN Take 1 tablet (4 mg total) by mouth every 8 (eight) hours as needed for nausea or vomiting.   oxyCODONE 5 MG immediate release tablet Commonly known as:  ROXICODONE Take 1 tablet (5 mg total) by mouth every 4 (four) hours as needed for severe pain.   rivaroxaban 10 MG Tabs tablet Commonly known as:  XARELTO Take 1 tablet (10 mg total) by mouth  daily.   sennosides-docusate sodium 8.6-50 MG tablet Commonly known as:  SENOKOT-S Take 2 tablets by mouth daily.       Diagnostic Studies: Dg Knee Left Port  Result Date: 03/27/2018 CLINICAL DATA:  The patient has undergone left unicompartmental knee joint replacement. EXAM: PORTABLE LEFT KNEE - 1-2 VIEW COMPARISON:  Left knee series dated June 10, 2014 FINDINGS: The patient has undergone a unicompartmental left knee joint prosthesis placement. Radiographic positioning of the prosthetic components is good. The interface with the native bone appears normal. The lateral joint compartment and the patellofemoral compartments are grossly normal. There is fluid and air within the joint space. IMPRESSION: No immediate postprocedure complication following left medial knee joint replacement. Electronically Signed   By: David  Martinique M.D.   On: 03/27/2018 13:36    Disposition: Discharge disposition: 01-Home or Self Care         Follow-up Information    Marchia Bond, MD. Schedule an appointment as soon as possible for a visit in 2 weeks.   Specialty:  Orthopedic Surgery Contact information: [address] [place] [address] [phone]            Signed: Johnny Bridge 03/29/2018, 10:15 AM

## 2018-03-29 NOTE — Social Work (Signed)
CSW received phone call from family member indicating that patient will no longer want to go to SNF and will go home.  CSW will f/u with RNCM to assist with home needs.  CSW will sign off at this time as RNCM will assist further with disposition.  Elissa Hefty, LCSW Clinical Social Worker [phone]

## 2018-03-29 NOTE — Plan of Care (Signed)
?  Problem: Elimination: ?Goal: Will not experience complications related to bowel motility ?Outcome: Progressing ?  ?Problem: Pain Managment: ?Goal: General experience of comfort will improve ?Outcome: Progressing ?  ?Problem: Safety: ?Goal: Ability to remain free from injury will improve ?Outcome: Progressing ?  ?

## 2018-03-29 NOTE — Progress Notes (Addendum)
Physical Therapy Treatment Patient Details Name: Nicholas Delacruz MRN: [id number] DOB: Mar 31, 1966 Today's Date: 03/29/2018    History of Present Illness Pt is a 52 y/o male s/p elective L partial knee replacement. PMH includes DM, OSA on CPAP, and R TKA.     PT Comments    Patient tolerated session well and continues to make progress with mobility. Pt increased gait distance with supervision and stair training with min A to stabilize RW. Continue to progress as tolerated.    Follow Up Recommendations  Follow surgeon's recommendation for DC plan and follow-up therapies;Supervision for mobility/OOB     Equipment Recommendations  None recommended by PT    Recommendations for Other Services OT consult     Precautions / Restrictions Precautions Precautions: Knee Precaution Booklet Issued: Yes (comment) Precaution Comments: precautions reviewed with pt Restrictions Weight Bearing Restrictions: Yes LLE Weight Bearing: Weight bearing as tolerated    Mobility  Bed Mobility Overal bed mobility: Modified Independent Bed Mobility: Supine to Sit              Transfers Overall transfer level: Needs assistance Equipment used: Rolling walker (2 wheeled) Transfers: Sit to/from Stand Sit to Stand: Min guard         General transfer comment: for safety; cues for safe hand placement  Ambulation/Gait Ambulation/Gait assistance: Supervision Ambulation Distance (Feet): 300 Feet Assistive device: Rolling walker (2 wheeled) Gait Pattern/deviations: Step-through pattern;Decreased step length - right;Decreased stance time - left;Decreased stride length;Decreased weight shift to left;Antalgic;Trunk flexed Gait velocity: decreased   General Gait Details: cues for posture, sequencing, and step length symmetry   Stairs Stairs: Yes   Stair Management: No rails;Step to pattern;Backwards;With walker Number of Stairs: 2 General stair comments: cues for sequencing and technqiue; assist  to stabilize RW  Wheelchair Mobility    Modified Rankin (Stroke Patients Only)       Balance Overall balance assessment: Needs assistance Sitting-balance support: No upper extremity supported Sitting balance-Leahy Scale: Good     Standing balance support: Bilateral upper extremity supported;During functional activity Standing balance-Leahy Scale: Poor                              Cognition Arousal/Alertness: Awake/alert Behavior During Therapy: WFL for tasks assessed/performed Overall Cognitive Status: Within Functional Limits for tasks assessed                                        Exercises      General Comments        Pertinent Vitals/Pain Pain Assessment: Faces Faces Pain Scale: Hurts little more Pain Location: L knee  Pain Descriptors / Indicators: Guarding;Sore Pain Intervention(s): Limited activity within patient's tolerance;Monitored during session;Premedicated before session;Repositioned    Home Living                      Prior Function            PT Goals (current goals can now be found in the care plan section) Acute Rehab PT Goals Patient Stated Goal: to move better PT Goal Formulation: With patient Time For Goal Achievement: 04/10/18 Potential to Achieve Goals: Good Progress towards PT goals: Progressing toward goals    Frequency    7X/week      PT Plan Current plan remains appropriate    Co-evaluation  AM-PAC PT "6 Clicks" Daily Activity  Outcome Measure  Difficulty turning over in bed (including adjusting bedclothes, sheets and blankets)?: A Little Difficulty moving from lying on back to sitting on the side of the bed? : A Little Difficulty sitting down on and standing up from a chair with arms (e.g., wheelchair, bedside commode, etc,.)?: A Little Help needed moving to and from a bed to chair (including a wheelchair)?: A Little Help needed walking in hospital room?: A  Little Help needed climbing 3-5 steps with a railing? : A Little 6 Click Score: 18    End of Session Equipment Utilized During Treatment: Gait belt Activity Tolerance: Patient tolerated treatment well;Patient limited by fatigue Patient left: in chair;with call bell/phone within reach;with family/visitor present Nurse Communication: Mobility status PT Visit Diagnosis: Other abnormalities of gait and mobility (R26.89);Unsteadiness on feet (R26.81);Muscle weakness (generalized) (M62.81) Pain - Right/Left: Left Pain - part of body: Knee     Time: 1353-1406 PT Time Calculation (min) (ACUTE ONLY): 13 min  Charges:  $Gait Training: 8-22 mins                    G Codes:       Earney Navy, PTA Pager: [phone]     Darliss Cheney 03/29/2018, 4:29 PM

## 2018-03-31 DIAGNOSIS — I10 Essential (primary) hypertension: Secondary | ICD-10-CM | POA: Diagnosis not present

## 2018-03-31 DIAGNOSIS — E119 Type 2 diabetes mellitus without complications: Secondary | ICD-10-CM | POA: Diagnosis not present

## 2018-03-31 DIAGNOSIS — Z471 Aftercare following joint replacement surgery: Secondary | ICD-10-CM | POA: Diagnosis not present

## 2018-04-02 DIAGNOSIS — E119 Type 2 diabetes mellitus without complications: Secondary | ICD-10-CM | POA: Diagnosis not present

## 2018-04-02 DIAGNOSIS — Z471 Aftercare following joint replacement surgery: Secondary | ICD-10-CM | POA: Diagnosis not present

## 2018-04-02 DIAGNOSIS — I10 Essential (primary) hypertension: Secondary | ICD-10-CM | POA: Diagnosis not present

## 2018-04-04 DIAGNOSIS — E119 Type 2 diabetes mellitus without complications: Secondary | ICD-10-CM | POA: Diagnosis not present

## 2018-04-04 DIAGNOSIS — Z471 Aftercare following joint replacement surgery: Secondary | ICD-10-CM | POA: Diagnosis not present

## 2018-04-04 DIAGNOSIS — I10 Essential (primary) hypertension: Secondary | ICD-10-CM | POA: Diagnosis not present

## 2018-04-06 DIAGNOSIS — Z471 Aftercare following joint replacement surgery: Secondary | ICD-10-CM | POA: Diagnosis not present

## 2018-04-06 DIAGNOSIS — I10 Essential (primary) hypertension: Secondary | ICD-10-CM | POA: Diagnosis not present

## 2018-04-06 DIAGNOSIS — E119 Type 2 diabetes mellitus without complications: Secondary | ICD-10-CM | POA: Diagnosis not present

## 2018-04-06 DIAGNOSIS — G4733 Obstructive sleep apnea (adult) (pediatric): Secondary | ICD-10-CM | POA: Diagnosis not present

## 2018-04-09 DIAGNOSIS — M1712 Unilateral primary osteoarthritis, left knee: Secondary | ICD-10-CM | POA: Diagnosis not present

## 2018-04-10 DIAGNOSIS — E119 Type 2 diabetes mellitus without complications: Secondary | ICD-10-CM | POA: Diagnosis not present

## 2018-04-10 DIAGNOSIS — Z471 Aftercare following joint replacement surgery: Secondary | ICD-10-CM | POA: Diagnosis not present

## 2018-04-10 DIAGNOSIS — I10 Essential (primary) hypertension: Secondary | ICD-10-CM | POA: Diagnosis not present

## 2018-04-11 DIAGNOSIS — Z471 Aftercare following joint replacement surgery: Secondary | ICD-10-CM | POA: Diagnosis not present

## 2018-04-11 DIAGNOSIS — I10 Essential (primary) hypertension: Secondary | ICD-10-CM | POA: Diagnosis not present

## 2018-04-11 DIAGNOSIS — E119 Type 2 diabetes mellitus without complications: Secondary | ICD-10-CM | POA: Diagnosis not present

## 2018-04-13 DIAGNOSIS — Z471 Aftercare following joint replacement surgery: Secondary | ICD-10-CM | POA: Diagnosis not present

## 2018-04-13 DIAGNOSIS — I10 Essential (primary) hypertension: Secondary | ICD-10-CM | POA: Diagnosis not present

## 2018-04-13 DIAGNOSIS — E119 Type 2 diabetes mellitus without complications: Secondary | ICD-10-CM | POA: Diagnosis not present

## 2018-04-17 DIAGNOSIS — Z96659 Presence of unspecified artificial knee joint: Secondary | ICD-10-CM | POA: Diagnosis not present

## 2018-04-17 DIAGNOSIS — G4733 Obstructive sleep apnea (adult) (pediatric): Secondary | ICD-10-CM | POA: Diagnosis not present

## 2018-04-18 ENCOUNTER — Other Ambulatory Visit: Payer: Self-pay

## 2018-04-18 ENCOUNTER — Telehealth (HOSPITAL_COMMUNITY): Payer: Self-pay | Admitting: Physical Therapy

## 2018-04-18 ENCOUNTER — Ambulatory Visit (HOSPITAL_COMMUNITY): Payer: 59 | Attending: Orthopedic Surgery | Admitting: Physical Therapy

## 2018-04-18 ENCOUNTER — Encounter (HOSPITAL_COMMUNITY): Payer: Self-pay | Admitting: Physical Therapy

## 2018-04-18 DIAGNOSIS — R262 Difficulty in walking, not elsewhere classified: Secondary | ICD-10-CM | POA: Insufficient documentation

## 2018-04-18 DIAGNOSIS — R600 Localized edema: Secondary | ICD-10-CM

## 2018-04-18 DIAGNOSIS — M6281 Muscle weakness (generalized): Secondary | ICD-10-CM | POA: Insufficient documentation

## 2018-04-18 DIAGNOSIS — M25662 Stiffness of left knee, not elsewhere classified: Secondary | ICD-10-CM | POA: Insufficient documentation

## 2018-04-18 NOTE — Therapy (Addendum)
[facility] [address] [place], [place], [address] Phone: [phone]   Fax:  [phone]  Physical Therapy Evaluation  Patient Details  Name: Nicholas Delacruz MRN: [id number] Date of Birth: [birth year] Referring Provider: Marchia Bond   Encounter Date: 04/18/2018  PT End of Session - 04/18/18 1027    Visit Number  1    Number of Visits  18    Date for PT Re-Evaluation  05/30/18    Authorization Type  [employer]    Authorization - Visit Number  1    Authorization - Number of Visits  60 after 30 needsmedical review    PT Start Time  0825 pt late    PT Stop Time  0900    PT Time Calculation (min)  35 min    Activity Tolerance  Patient tolerated treatment well    Behavior During Therapy  [facility] for tasks assessed/performed       Past Medical History:  Diagnosis Date  . Arthritis   . Diabetes mellitus without complication ([place])   . GERD (gastroesophageal reflux disease)   . High blood pressure   . Obstructive sleep apnea on CPAP    BIPAP  . PONV (postoperative nausea and vomiting)    blood pressure dropped in recovery  . Primary localized osteoarthritis of left knee 03/27/2018    Past Surgical History:  Procedure Laterality Date  . APPENDECTOMY    . KNEE ARTHROPLASTY    . KNEE ARTHROSCOPY Right 01/02/2015   Procedure: RIGHT KNEE ARTHROSCOPY, LATERAL AND MEDIAL MENISECTOMY;  Surgeon: Carole Civil, MD;  Location: [facility];  Service: Orthopedics;  Laterality: Right;  . KNEE ARTHROSCOPY WITH MEDIAL MENISECTOMY Left 06/25/2014   Procedure: KNEE ARTHROSCOPY WITH MEDIAL MENISECTOMY;  Surgeon: Carole Civil, MD;  Location: [facility];  Service: Orthopedics;  Laterality: Left;  . KNEE SURGERY Left   . MENISCUS DEBRIDEMENT Right 01/02/2015   Procedure: DEBRIDEMENT OF RIGHT KNEE JOINT;  Surgeon: Carole Civil, MD;  Location: [facility];  Service: Orthopedics;  Laterality: Right;  . PARTIAL KNEE ARTHROPLASTY Left 03/27/2018    Procedure: UNICOMPARTMENTAL LEFT KNEE;  Surgeon: Marchia Bond, MD;  Location: [facility];  Service: Orthopedics;  Laterality: Left;  . right knee sark    . SHOULDER SURGERY     left  . TOTAL KNEE ARTHROPLASTY Right 08/30/2016   Procedure: TOTAL KNEE ARTHROPLASTY;  Surgeon: Marchia Bond, MD;  Location: [facility];  Service: Orthopedics;  Laterality: Right;  . TOTAL SHOULDER ARTHROPLASTY      There were no vitals filed for this visit.   Subjective Assessment - 04/18/18 0810    Subjective  PT opted to have a Lt partial knee replacement on 03/27/2018.  He recieved [facility] after discharge and is now being referred to skilled OP therapy.     Pertinent History  Rt TKR, DM, OA    Limitations  Standing;Walking;House hold activities    How long can you sit comfortably?  10 minutes    How long can you stand comfortably?  5 minutes     How long can you walk comfortably?  Walking with a quad cane able to walk 5-10 minutes     Patient Stated Goals  To have no pain and bend his knee better.     Currently in Pain?  Yes    Pain Score  4     Pain Location  Knee    Pain Orientation  Left    Pain  Descriptors / Indicators  Aching;Discomfort;Tightness    Pain Type  Acute pain    Pain Onset  1 to 4 weeks ago    Pain Frequency  Constant    Aggravating Factors   activity     Pain Relieving Factors  ice, meds     Effect of Pain on Daily Activities  limits         [facility] PT Assessment - 04/18/18 0001      Assessment   Medical Diagnosis  LT partial knee replacement     Referring Provider  Marchia Bond    Onset Date/Surgical Date  03/27/18    Prior Therapy  HH      Precautions   Precautions  None      Restrictions   Weight Bearing Restrictions  No      Balance Screen   Has the patient fallen in the past 6 months  No    Has the patient had a decrease in activity level because of a fear of falling?   Yes    Is the patient reluctant to leave their home because of a fear of falling?   No      Home Investment [employer], operational residence      Prior Function   Level of Independence  Independent      Cognition   Overall Cognitive Status  Within Functional Limits for tasks assessed      Observation/Other Assessments   Focus on Therapeutic Outcomes (FOTO)   32      Functional Tests   Functional tests  Single leg stance;Sit to Stand      Single Leg Stance   Comments  Rt:  12 LT:  7      Sit to Stand   Comments  Using B UE :  30.75       ROM / Strength   AROM / PROM / Strength  AROM;Strength      AROM   AROM Assessment Site  Knee    Right/Left Knee  Left    Left Knee Extension  10    Left Knee Flexion  97      Strength   Strength Assessment Site  Hip;Knee;Ankle    Right/Left Hip  Right;Left    Right Hip Flexion  5/5    Right Hip Extension  5/5    Left Hip Flexion  5/5    Left Hip Extension  4-/5    Left Hip ABduction  5/5    Right/Left Knee  Right;Left    Right Knee Flexion  5/5    Right Knee Extension  5/5    Left Knee Flexion  4/5    Left Knee Extension  5/5      Ambulation/Gait   Ambulation Distance (Feet)  300 Feet    Assistive device  Small based quad cane                Objective measurements completed on examination: See above findings.      [facility] Adult PT Treatment/Exercise - 04/18/18 0001      Ambulation/Gait   Gait Comments  3 minutes       Exercises   Exercises  Knee/Hip      Knee/Hip Exercises: Stretches   Active Hamstring Stretch  Left;2 reps;20 seconds      Knee/Hip Exercises: Standing   Heel Raises  5 reps      Knee/Hip Exercises: Supine   Quad Sets  Left;10 reps  Heel Slides  Left;10 reps             PT Education - 04/18/18 0850    Education provided  Yes    Education Details  HEP    Person(s) Educated  Patient    Methods  Explanation;Handout    Comprehension  Verbalized understanding;Returned demonstration       PT Short Term Goals - 04/18/18 1214      PT SHORT TERM GOAL #1   Title  Pt Lt knee  extension to be below 4 to allow a normal heel toe gait pattern    Time  3    Period  Weeks    Status  New    Target Date  05/09/18      PT SHORT TERM GOAL #2   Title  PT lt knee flexion to be to 110 to allow pt to sit for 30-40 minutes in comfort to enjoy a meal     Time  3    Period  Weeks    Status  New      PT SHORT TERM GOAL #3   Title  Pt strength in LE to be improved to allow pt to come from sit to stand without the use of UE support     Time  3    Period  Weeks    Status  New      PT SHORT TERM GOAL #4   Title  PT pain to be no grater than a 3/10 to allow pt to be comfortable walking for 20 minutes to complete short shopping trips     Time  3    Period  Weeks    Status  New        PT Long Term Goals - 04/18/18 1217      PT LONG TERM GOAL #1   Title  PT to be able to single leg stance on both LE for at least 15 seconds to allow pt to feel confident walking without an assistive device.     Time  6    Period  Weeks    Status  New      PT LONG TERM GOAL #2   Title  PT strength in LT LE to be increased to allow pt to ascend and descend 10 steps in a reciprocal manner.     Time  6    Period  Weeks    Status  New      PT LONG TERM GOAL #3   Title  Pt pain level to be no greater than a 1/10 in his lt knee to allow him to walk for over an hour to go out to functions/shopping.     Time  6    Period  Weeks    Status  New      PT LONG TERM GOAL #4   Title  PT Lt knee ROM to improve to 120  allow pt to be able to squat to pick items off the floor with ease     Time  6    Period  Weeks    Status  New             Plan - 04/18/18 1028    Clinical Impression Statement  Mr. Azevedo is a 52 yo male who has had a partial Lt knee replacement on 03/27/2018.  He received home health and is now being referred to skilled physical therapy to maximize his functional ability.  Examination  demonstrates decreased balance, decreased ROM, decreased activity tolerance, increased  pain and increased edema.  Mr. Buening will benefit from skilled physical therapy to address these issues and maximize his functional abiltiy.     History and Personal Factors relevant to plan of care:  Rt TKR , HTN, DM     Clinical Presentation  Stable    Clinical Decision Making  Moderate    Rehab Potential  Good    PT Frequency  3x / week    PT Duration  6 weeks    PT Treatment/Interventions  ADLs/Self Care Home Management;Cryotherapy;Therapeutic activities;Therapeutic exercise;Balance training;Gait training;Stair training;Functional mobility training;Patient/family education;Manual techniques    PT Next Visit Plan  Begin standing and supine terminal extension, standing functional squat, rockerboard and knee flexion with manual to decrease edema.     PT Home Exercise Plan  eval:  quad set, active hamstring stretch and heelslide     Consulted and Agree with Plan of Care  Patient       Patient will benefit from skilled therapeutic intervention in order to improve the following deficits and impairments:  Abnormal gait, Decreased activity tolerance, Decreased balance, Decreased mobility, Decreased range of motion, Difficulty walking, Increased edema, Pain, Impaired perceived functional ability  Visit Diagnosis: Stiffness of left knee, not elsewhere classified - Plan: PT plan of care cert/re-cert  Localized edema - Plan: PT plan of care cert/re-cert  Difficulty in walking, not elsewhere classified - Plan: PT plan of care cert/re-cert  Muscle weakness (generalized) - Plan: PT plan of care cert/re-cert     Problem List Patient Active Problem List   Diagnosis Date Noted  . Primary localized osteoarthritis of left knee 03/27/2018  . S/P total knee arthroplasty 08/30/2016  . Weakness of left leg 03/13/2014  . Difficulty in walking(719.7) 03/13/2014  . Arthritis of knee, left 01/21/2014  . Cervicalgia 11/08/2011  . RHEUMATOID ARTHRITIS 02/01/2011  . RUPTURE ROTATOR CUFF 02/01/2011  .  IMPINGEMENT SYNDROME 12/07/2010  . Primary localized osteoarthritis of right knee 09/28/2009  . Essential hypertension 05/13/2009  Rayetta Humphrey, PT CLT [phone] 04/18/2018, 12:26 PM  [facility] [address] [place], [place], [address] Phone: [phone]   Fax:  [phone]  Name: Nicholas Delacruz MRN: [id number] Date of Birth: [birth year]

## 2018-04-18 NOTE — Patient Instructions (Addendum)
Heel Raise: Bilateral (Standing)    Rise on balls of feet. Repeat _10___ times per set. Do _1___ sets per session. Do ___3_ sessions per day.  http://orth.exer.us/38   Copyright  VHI. All rights reserved.  Strengthening: Quadriceps Set    Tighten muscles on top of thighs by pushing knees down into surface. Hold __5__ seconds. Repeat __10__ times per set. Do __1__ sets per session. Do _3___ sessions per day.  http://orth.exer.us/602   Copyright  VHI. All rights reserved.  Stretching: Hamstring (Supine)    Supporting right thigh behind knee, slowly straighten knee until stretch is felt in back of thigh. Hold _15___ seconds. Now bend your knee hold  Repeat _5___ times per set. Do __1__ sets per session. Do ___2_ sessions per day.  http://orth.exer.us/656   Copyright  VHI. All rights reserved.  Self-Mobilization: Heel Slide (Supine)    Slide left heel toward buttocks until a gentle stretch is felt. Hold __5__ seconds. Relax. Repeat __10__ times per set. Do __1__ sets per session. Do _2___ sessions per day.  http://orth.exer.us/710   Copyright  VHI. All rights reserved.

## 2018-04-18 NOTE — Telephone Encounter (Signed)
Patient forgot to bring in referral and will bring it in later . was late for his appt and I aloud him to go back with [facility] before I realized we didnt have it WT

## 2018-04-19 ENCOUNTER — Telehealth (HOSPITAL_COMMUNITY): Payer: Self-pay

## 2018-04-19 NOTE — Telephone Encounter (Signed)
Patient was going to bring in a copy of his referral and said his sister will bring it

## 2018-04-20 ENCOUNTER — Ambulatory Visit (HOSPITAL_COMMUNITY): Payer: 59

## 2018-04-20 ENCOUNTER — Encounter (HOSPITAL_COMMUNITY): Payer: Self-pay

## 2018-04-20 DIAGNOSIS — M25662 Stiffness of left knee, not elsewhere classified: Secondary | ICD-10-CM | POA: Diagnosis not present

## 2018-04-20 DIAGNOSIS — R600 Localized edema: Secondary | ICD-10-CM

## 2018-04-20 DIAGNOSIS — M6281 Muscle weakness (generalized): Secondary | ICD-10-CM

## 2018-04-20 DIAGNOSIS — R262 Difficulty in walking, not elsewhere classified: Secondary | ICD-10-CM

## 2018-04-20 NOTE — Therapy (Signed)
[facility] [address] [place], [place], [address] Phone: [phone]   Fax:  [phone]  Physical Therapy Treatment  Patient Details  Name: Nicholas Delacruz MRN: [id number] Date of Birth: [birth year] Referring Provider: Marchia Bond   Encounter Date: 04/20/2018  PT End of Session - 04/20/18 1609    Visit Number  2    Number of Visits  18    Date for PT Re-Evaluation  05/30/18 Mini-reassess 05/09/18    Authorization Type  [employer]    Authorization Time Period  4/24-->05/30/2018    Authorization - Visit Number  2    Authorization - Number of Visits  60 after 30 needs medical review    PT Start Time  1605    PT Stop Time  1643    PT Time Calculation (min)  38 min    Activity Tolerance  Patient tolerated treatment well    Behavior During Therapy  [facility] for tasks assessed/performed       Past Medical History:  Diagnosis Date  . Arthritis   . Diabetes mellitus without complication ([place])   . GERD (gastroesophageal reflux disease)   . High blood pressure   . Obstructive sleep apnea on CPAP    BIPAP  . PONV (postoperative nausea and vomiting)    blood pressure dropped in recovery  . Primary localized osteoarthritis of left knee 03/27/2018    Past Surgical History:  Procedure Laterality Date  . APPENDECTOMY    . KNEE ARTHROPLASTY    . KNEE ARTHROSCOPY Right 01/02/2015   Procedure: RIGHT KNEE ARTHROSCOPY, LATERAL AND MEDIAL MENISECTOMY;  Surgeon: Carole Civil, MD;  Location: [facility];  Service: Orthopedics;  Laterality: Right;  . KNEE ARTHROSCOPY WITH MEDIAL MENISECTOMY Left 06/25/2014   Procedure: KNEE ARTHROSCOPY WITH MEDIAL MENISECTOMY;  Surgeon: Carole Civil, MD;  Location: [facility];  Service: Orthopedics;  Laterality: Left;  . KNEE SURGERY Left   . MENISCUS DEBRIDEMENT Right 01/02/2015   Procedure: DEBRIDEMENT OF RIGHT KNEE JOINT;  Surgeon: Carole Civil, MD;  Location: [facility];  Service: Orthopedics;  Laterality:  Right;  . PARTIAL KNEE ARTHROPLASTY Left 03/27/2018   Procedure: UNICOMPARTMENTAL LEFT KNEE;  Surgeon: Marchia Bond, MD;  Location: [facility];  Service: Orthopedics;  Laterality: Left;  . right knee sark    . SHOULDER SURGERY     left  . TOTAL KNEE ARTHROPLASTY Right 08/30/2016   Procedure: TOTAL KNEE ARTHROPLASTY;  Surgeon: Marchia Bond, MD;  Location: [facility];  Service: Orthopedics;  Laterality: Right;  . TOTAL SHOULDER ARTHROPLASTY      There were no vitals filed for this visit.  Subjective Assessment - 04/20/18 1607    Subjective  Pt stated his knee is achey, pain scale 4/10.  Pt stated he goes around the house some without cane, continue to use QC outside.  No reoprts of recent fall.  Reports compliance wiht HEP daily.      Pertinent History  Rt TKR, DM, OA    Patient Stated Goals  To have no pain and bend his knee better.     Currently in Pain?  Yes    Pain Score  4     Pain Location  Knee    Pain Orientation  Left    Pain Descriptors / Indicators  Aching    Pain Onset  1 to 4 weeks ago    Pain Frequency  Constant    Aggravating Factors   activity  Pain Relieving Factors  ice, meds    Effect of Pain on Daily Activities  limits                       [facility] Adult PT Treatment/Exercise - 04/20/18 0001      Knee/Hip Exercises: Stretches   Active Hamstring Stretch  Left;3 reps;30 seconds;Limitations    Active Hamstring Stretch Limitations  standing 12in step    Gastroc Stretch  3 reps;30 seconds;Limitations    Gastroc Stretch Limitations  slant board      Knee/Hip Exercises: Standing   Heel Raises  15 reps    Knee Flexion  --    Terminal Knee Extension  10 reps;Theraband    Theraband Level (Terminal Knee Extension)  Level 3 (Green)    Functional Squat  10 reps    Rocker Board  2 minutes;Limitations      Knee/Hip Exercises: Supine   Quad Sets  Left;10 reps tactile/verbal cueing to improve quad contraction vs gluts    Short Arc Quad Sets  15 reps    Heel  Slides  Left;10 reps;2 sets    Terminal Knee Extension  Left;10 reps cueing for technique    Knee Extension  AROM;Limitations    Knee Extension Limitations  9 (was 10 last session)    Knee Flexion  AROM;Limitations    Knee Flexion Limitations  110 (was 97 last session      Manual Therapy   Manual Therapy  Edema management;Joint mobilization    Manual therapy comments  performed separately from all other skilled treatments     Edema Management  retro massage for edema control    Joint Mobilization  patella mobs all directions.             PT Education - 04/20/18 1616    Education provided  Yes    Education Details  Reviewed goals, assured compliance with HEP and copy of eval given to pt.  reviewed RICE for pain and edema control    Person(s) Educated  Patient    Methods  Explanation;Demonstration;Handout    Comprehension  Verbalized understanding;Returned demonstration       PT Short Term Goals - 04/18/18 1214      PT SHORT TERM GOAL #1   Title  Pt Lt knee extension to be below 4 to allow a normal heel toe gait pattern    Time  3    Period  Weeks    Status  New    Target Date  05/09/18      PT SHORT TERM GOAL #2   Title  PT lt knee flexion to be to 110 to allow pt to sit for 30-40 minutes in comfort to enjoy a meal     Time  3    Period  Weeks    Status  New      PT SHORT TERM GOAL #3   Title  Pt strength in LE to be improved to allow pt to come from sit to stand without the use of UE support     Time  3    Period  Weeks    Status  New      PT SHORT TERM GOAL #4   Title  PT pain to be no grater than a 3/10 to allow pt to be comfortable walking for 20 minutes to complete short shopping trips     Time  3    Period  Weeks    Status  New        PT Long Term Goals - 04/18/18 1217      PT LONG TERM GOAL #1   Title  PT to be able to single leg stance on both LE for at least 15 seconds to allow pt to feel confident walking without an assistive device.     Time   6    Period  Weeks    Status  New      PT LONG TERM GOAL #2   Title  PT strength in LT LE to be increased to allow pt to ascend and descend 10 steps in a reciprocal manner.     Time  6    Period  Weeks    Status  New      PT LONG TERM GOAL #3   Title  Pt pain level to be no greater than a 1/10 in his lt knee to allow him to walk for over an hour to go out to functions/shopping.     Time  6    Period  Weeks    Status  New      PT LONG TERM GOAL #4   Title  PT Lt knee ROM to improve to 120  allow pt to be able to squat to pick items off the floor with ease     Time  6    Period  Weeks    Status  New            Plan - 04/20/18 1646    Clinical Impression Statement  Reviewed goals, assured compliance iwht HEP and copy of eval given to pt.  Session focus with knee mobility to improve range and functional strengthening.  Pt progressing well with improved AROM 9-110 degrees (was 10-97 degrees last session).  Pt presents with minimal edema present.  Manual techniques were complete to address edema present proximal knee and joint mobs to improve mobilty.  Reviewed RICe for pain and edema control following session.      Rehab Potential  Good    PT Frequency  3x / week    PT Duration  6 weeks    PT Treatment/Interventions  ADLs/Self Care Home Management;Cryotherapy;Therapeutic activities;Therapeutic exercise;Balance training;Gait training;Stair training;Functional mobility training;Patient/family education;Manual techniques    PT Next Visit Plan  Continue focus with knee mobilty, extension > flexion.  Continue to address quad strengthening and functional strengthening as appropriate.  Add knee drives for mobility.  Manual for scar tissue mobilizaitn as appropriate.      PT Home Exercise Plan  eval:  quad set, active hamstring stretch and heelslide        Patient will benefit from skilled therapeutic intervention in order to improve the following deficits and impairments:  Abnormal gait,  Decreased activity tolerance, Decreased balance, Decreased mobility, Decreased range of motion, Difficulty walking, Increased edema, Pain, Impaired perceived functional ability  Visit Diagnosis: Stiffness of left knee, not elsewhere classified  Localized edema  Difficulty in walking, not elsewhere classified  Muscle weakness (generalized)     Problem List Patient Active Problem List   Diagnosis Date Noted  . Primary localized osteoarthritis of left knee 03/27/2018  . S/P total knee arthroplasty 08/30/2016  . Weakness of left leg 03/13/2014  . Difficulty in walking(719.7) 03/13/2014  . Arthritis of knee, left 01/21/2014  . Cervicalgia 11/08/2011  . RHEUMATOID ARTHRITIS 02/01/2011  . RUPTURE ROTATOR CUFF 02/01/2011  . IMPINGEMENT SYNDROME 12/07/2010  . Primary localized osteoarthritis of right knee 09/28/2009  .  Essential hypertension 05/13/2009   Ihor Austin, [place]; [place]  Aldona Lento 04/20/2018, 4:52 PM  [facility] [address] [place], [place], [address] Phone: [phone]   Fax:  [phone]  Name: DEWAN EMOND MRN: [id number] Date of Birth: 1966/04/12

## 2018-04-23 ENCOUNTER — Ambulatory Visit (HOSPITAL_COMMUNITY): Payer: 59

## 2018-04-23 ENCOUNTER — Encounter (HOSPITAL_COMMUNITY): Payer: Self-pay

## 2018-04-23 DIAGNOSIS — M6281 Muscle weakness (generalized): Secondary | ICD-10-CM

## 2018-04-23 DIAGNOSIS — R600 Localized edema: Secondary | ICD-10-CM

## 2018-04-23 DIAGNOSIS — M25662 Stiffness of left knee, not elsewhere classified: Secondary | ICD-10-CM | POA: Diagnosis not present

## 2018-04-23 DIAGNOSIS — R262 Difficulty in walking, not elsewhere classified: Secondary | ICD-10-CM

## 2018-04-23 NOTE — Therapy (Signed)
[facility] [address] [place], [place], [address] Phone: [phone]   Fax:  [phone]  Physical Therapy Treatment  Patient Details  Name: Nicholas Delacruz MRN: [id number] Date of Birth: 10-28-1966 Referring Provider: Marchia Bond   Encounter Date: 04/23/2018  PT End of Session - 04/23/18 1522    Visit Number  3    Number of Visits  18    Date for PT Re-Evaluation  05/30/18 Mini-reassess 05/09/18    Authorization Type  [employer]    Authorization Time Period  4/24-->05/30/2018    Authorization - Visit Number  3    Authorization - Number of Visits  60 after 30 needs medical review    PT Start Time  1518    PT Stop Time  1601    PT Time Calculation (min)  43 min    Activity Tolerance  Patient tolerated treatment well    Behavior During Therapy  [facility] for tasks assessed/performed       Past Medical History:  Diagnosis Date  . Arthritis   . Diabetes mellitus without complication ([place])   . GERD (gastroesophageal reflux disease)   . High blood pressure   . Obstructive sleep apnea on CPAP    BIPAP  . PONV (postoperative nausea and vomiting)    blood pressure dropped in recovery  . Primary localized osteoarthritis of left knee 03/27/2018    Past Surgical History:  Procedure Laterality Date  . APPENDECTOMY    . KNEE ARTHROPLASTY    . KNEE ARTHROSCOPY Right 01/02/2015   Procedure: RIGHT KNEE ARTHROSCOPY, LATERAL AND MEDIAL MENISECTOMY;  Surgeon: Carole Civil, MD;  Location[facility];  Service: Orthopedics;  Laterality: Right;  . KNEE ARTHROSCOPY WITH MEDIAL MENISECTOMY Left 06/25/2014   Procedure: KNEE ARTHROSCOPY WITH MEDIAL MENISECTOMY;  Surgeon: Carole Civil, MD;  Location: [facility];  Service: Orthopedics;  Laterality: Left;  . KNEE SURGERY Left   . MENISCUS DEBRIDEMENT Right 01/02/2015   Procedure: DEBRIDEMENT OF RIGHT KNEE JOINT;  Surgeon: Carole Civil, MD;  Location: [facility];  Service: Orthopedics;  Laterality:  Right;  . PARTIAL KNEE ARTHROPLASTY Left 03/27/2018   Procedure: UNICOMPARTMENTAL LEFT KNEE;  Surgeon: Marchia Bond, MD;  Location: [facility];  Service: Orthopedics;  Laterality: Left;  . right knee sark    . SHOULDER SURGERY     left  . TOTAL KNEE ARTHROPLASTY Right 08/30/2016   Procedure: TOTAL KNEE ARTHROPLASTY;  Surgeon: Marchia Bond, MD;  Location: [facility];  Service: Orthopedics;  Laterality: Right;  . TOTAL SHOULDER ARTHROPLASTY      There were no vitals filed for this visit.  Subjective Assessment - 04/23/18 1522    Subjective  Pt states that his knee is doing okay, about 3-4/10 achey pain. It is waking him up at night. He states that he hasn't been using his cane since his last appointment but he will use a walking stick when he is outside.     Pertinent History  Rt TKR, DM, OA    Patient Stated Goals  To have no pain and bend his knee better.     Currently in Pain?  Yes    Pain Score  4     Pain Location  Knee    Pain Orientation  Left    Pain Descriptors / Indicators  Aching    Pain Type  Acute pain    Pain Onset  1 to 4 weeks ago    Pain Frequency  Constant    Aggravating Factors   activity    Pain Relieving Factors  ice, meds    Effect of Pain on Daily Activities  limits              [facility] Adult PT Treatment/Exercise - 04/23/18 1519      Knee/Hip Exercises: Stretches   Active Hamstring Stretch  Left;3 reps;30 seconds;Limitations    Active Hamstring Stretch Limitations  standing 12in step    Knee: Self-Stretch to increase Flexion  Left    Knee: Self-Stretch Limitations  10x15" holds on 12" step    Gastroc Stretch  3 reps;30 seconds;Limitations    Gastroc Stretch Limitations  slant board      Knee/Hip Exercises: Aerobic   Stationary Bike  x3 mins, 1/2 revolutions for mobility, seat 19      Knee/Hip Exercises: Standing   Heel Raises  15 reps;Limitations    Heel Raises Limitations  heel and toe    Knee Flexion  Left;15 reps    Terminal Knee Extension  Left;15 reps     Theraband Level (Terminal Knee Extension)  Level 3 (Green)    Terminal Knee Extension Limitations  5" holds    Functional Squat  15 reps    Rocker Board  2 minutes;Limitations    Rocker Board Limitations  R/L, BUE support      Knee/Hip Exercises: Supine   Quad Sets  Left;15 reps;Limitations    Quad Sets Limitations  5" holds    Short Arc Quad Sets  Left;15 reps;Limitations    Short Arc Quad Sets Limitations  basketball, 3-5" holds    Knee Extension Limitations  8    Knee Flexion Limitations  110      Knee/Hip Exercises: Prone   Hamstring Curl  15 reps LLE    Other Prone Exercises  prone TKE 15x5" holds      Manual Therapy   Manual Therapy  Edema management;Joint mobilization    Manual therapy comments  performed separately from all other skilled treatments     Edema Management  retro massage for edema control    Joint Mobilization  patella mobs all directions.            PT Short Term Goals - 04/18/18 1214      PT SHORT TERM GOAL #1   Title  Pt Lt knee extension to be below 4 to allow a normal heel toe gait pattern    Time  3    Period  Weeks    Status  New    Target Date  05/09/18      PT SHORT TERM GOAL #2   Title  PT lt knee flexion to be to 110 to allow pt to sit for 30-40 minutes in comfort to enjoy a meal     Time  3    Period  Weeks    Status  New      PT SHORT TERM GOAL #3   Title  Pt strength in LE to be improved to allow pt to come from sit to stand without the use of UE support     Time  3    Period  Weeks    Status  New      PT SHORT TERM GOAL #4   Title  PT pain to be no grater than a 3/10 to allow pt to be comfortable walking for 20 minutes to complete short shopping trips     Time  3  Period  Weeks    Status  New        PT Long Term Goals - 04/18/18 1217      PT LONG TERM GOAL #1   Title  PT to be able to single leg stance on both LE for at least 15 seconds to allow pt to feel confident walking without an assistive device.     Time   6    Period  Weeks    Status  New      PT LONG TERM GOAL #2   Title  PT strength in LT LE to be increased to allow pt to ascend and descend 10 steps in a reciprocal manner.     Time  6    Period  Weeks    Status  New      PT LONG TERM GOAL #3   Title  Pt pain level to be no greater than a 1/10 in his lt knee to allow him to walk for over an hour to go out to functions/shopping.     Time  6    Period  Weeks    Status  New      PT LONG TERM GOAL #4   Title  PT Lt knee ROM to improve to 120  allow pt to be able to squat to pick items off the floor with ease     Time  6    Period  Weeks    Status  New            Plan - 04/23/18 1606    Clinical Impression Statement  Continued with established POC focusing on L knee mobility. Introduced pt to stationary bike this date and he was only able to do 1/2 revolutions on seat 19 due to lack of ROM. Added standing knee flexion, knee drives on step, and prone TKE this date and increased pt's reps with other established exercises, all with good tolerance. Ended with manual for patellar mobs and retro massage for edema control. AROM 8-110 deg this date. Encouraged pt to continue quad sets at home. Continue as planned, progressing as able.     Rehab Potential  Good    PT Frequency  3x / week    PT Duration  6 weeks    PT Treatment/Interventions  ADLs/Self Care Home Management;Cryotherapy;Therapeutic activities;Therapeutic exercise;Balance training;Gait training;Stair training;Functional mobility training;Patient/family education;Manual techniques    PT Next Visit Plan  add prone quad stretch and prone knee hangs; Continue focus with knee mobilty, extension > flexion.  Continue to address quad strengthening and functional strengthening as appropriate.  Manual for scar tissue mobilizaitn as appropriate.     PT Home Exercise Plan  eval:  quad set, active hamstring stretch and heelslide     Consulted and Agree with Plan of Care  Patient        Patient will benefit from skilled therapeutic intervention in order to improve the following deficits and impairments:  Abnormal gait, Decreased activity tolerance, Decreased balance, Decreased mobility, Decreased range of motion, Difficulty walking, Increased edema, Pain, Impaired perceived functional ability  Visit Diagnosis: Stiffness of left knee, not elsewhere classified  Localized edema  Difficulty in walking, not elsewhere classified  Muscle weakness (generalized)     Problem List Patient Active Problem List   Diagnosis Date Noted  . Primary localized osteoarthritis of left knee 03/27/2018  . S/P total knee arthroplasty 08/30/2016  . Weakness of left leg 03/13/2014  . Difficulty in walking(719.7) 03/13/2014  .  Arthritis of knee, left 01/21/2014  . Cervicalgia 11/08/2011  . RHEUMATOID ARTHRITIS 02/01/2011  . RUPTURE ROTATOR CUFF 02/01/2011  . IMPINGEMENT SYNDROME 12/07/2010  . Primary localized osteoarthritis of right knee 09/28/2009  . Essential hypertension 05/13/2009        Geraldine Solar PT, DPT  [facility] [address] [place], [place], [address] Phone: [phone]   Fax:  [phone]  Name: Nicholas Delacruz MRN: [id number] Date of Birth: [birth year]

## 2018-04-23 NOTE — Addendum Note (Signed)
Addended by: Leeroy Cha on: 04/23/2018 12:13 PM   Modules accepted: Orders

## 2018-04-25 ENCOUNTER — Encounter (HOSPITAL_COMMUNITY): Payer: Self-pay

## 2018-04-25 ENCOUNTER — Ambulatory Visit (HOSPITAL_COMMUNITY): Payer: 59 | Attending: Orthopedic Surgery

## 2018-04-25 DIAGNOSIS — R262 Difficulty in walking, not elsewhere classified: Secondary | ICD-10-CM | POA: Insufficient documentation

## 2018-04-25 DIAGNOSIS — M6281 Muscle weakness (generalized): Secondary | ICD-10-CM | POA: Insufficient documentation

## 2018-04-25 DIAGNOSIS — R600 Localized edema: Secondary | ICD-10-CM | POA: Diagnosis present

## 2018-04-25 DIAGNOSIS — M25662 Stiffness of left knee, not elsewhere classified: Secondary | ICD-10-CM | POA: Diagnosis not present

## 2018-04-25 NOTE — Therapy (Signed)
[facility] [address] [place], [place], [address] Phone: [phone]   Fax:  [phone]  Physical Therapy Treatment  Patient Details  Name: Nicholas Delacruz MRN: [id number] Date of Birth: Apr 28, 1966 Referring Provider: Marchia Bond   Encounter Date: 04/25/2018  PT End of Session - 04/25/18 1345    Visit Number  4    Number of Visits  18    Date for PT Re-Evaluation  05/30/18 Mini-reassess 05/09/18    Authorization Type  [employer]    Authorization Time Period  4/24-->05/30/2018    Authorization - Visit Number  4    Authorization - Number of Visits  60 after 30 needs medical review    PT Start Time  1345    PT Stop Time  1427    PT Time Calculation (min)  42 min    Activity Tolerance  Patient tolerated treatment well    Behavior During Therapy  [facility] for tasks assessed/performed       Past Medical History:  Diagnosis Date  . Arthritis   . Diabetes mellitus without complication ([place])   . GERD (gastroesophageal reflux disease)   . High blood pressure   . Obstructive sleep apnea on CPAP    BIPAP  . PONV (postoperative nausea and vomiting)    blood pressure dropped in recovery  . Primary localized osteoarthritis of left knee 03/27/2018    Past Surgical History:  Procedure Laterality Date  . APPENDECTOMY    . KNEE ARTHROPLASTY    . KNEE ARTHROSCOPY Right 01/02/2015   Procedure: RIGHT KNEE ARTHROSCOPY, LATERAL AND MEDIAL MENISECTOMY;  Surgeon: Carole Civil, MD;  Location: [facility];  Service: Orthopedics;  Laterality: Right;  . KNEE ARTHROSCOPY WITH MEDIAL MENISECTOMY Left 06/25/2014   Procedure: KNEE ARTHROSCOPY WITH MEDIAL MENISECTOMY;  Surgeon: Carole Civil, MD;  Location: [facility];  Service: Orthopedics;  Laterality: Left;  . KNEE SURGERY Left   . MENISCUS DEBRIDEMENT Right 01/02/2015   Procedure: DEBRIDEMENT OF RIGHT KNEE JOINT;  Surgeon: Carole Civil, MD;  Location: [facility];  Service: Orthopedics;  Laterality:  Right;  . PARTIAL KNEE ARTHROPLASTY Left 03/27/2018   Procedure: UNICOMPARTMENTAL LEFT KNEE;  Surgeon: Marchia Bond, MD;  Location: [place];  Service: Orthopedics;  Laterality: Left;  . right knee sark    . SHOULDER SURGERY     left  . TOTAL KNEE ARTHROPLASTY Right 08/30/2016   Procedure: TOTAL KNEE ARTHROPLASTY;  Surgeon: Marchia Bond, MD;  Location: [facility] [place];  Service: Orthopedics;  Laterality: Right;  . TOTAL SHOULDER ARTHROPLASTY      There were no vitals filed for this visit.  Subjective Assessment - 04/25/18 1346    Subjective  Pt states that he had a rough day yesterday. He reports that he stayed on it for too long following his last treatment session.     Pertinent History  Rt TKR, DM, OA    Patient Stated Goals  To have no pain and bend his knee better.     Currently in Pain?  Yes    Pain Score  3     Pain Location  Knee    Pain Orientation  Left    Pain Descriptors / Indicators  Aching    Pain Type  Acute pain    Pain Onset  1 to 4 weeks ago    Pain Frequency  Constant    Aggravating Factors   activity    Pain Relieving Factors  ice, meds  Effect of Pain on Daily Activities  limits             [facility] Adult PT Treatment/Exercise - 04/25/18 0001      Knee/Hip Exercises: Stretches   Active Hamstring Stretch  Left;3 reps;30 seconds;Limitations    Active Hamstring Stretch Limitations  standing 12in step    Quad Stretch  Left;2 reps;30 seconds;Limitations    Quad Stretch Limitations  prone with rope    Knee: Self-Stretch to increase Flexion  Left    Knee: Self-Stretch Limitations  10x15" holds on 12" step    Gastroc Stretch  3 reps;30 seconds;Limitations    Gastroc Stretch Limitations  slant board      Knee/Hip Exercises: Standing   Heel Raises  Both;20 reps    Heel Raises Limitations  heel and toe    Knee Flexion  Left;15 reps    Terminal Knee Extension  Left    Theraband Level (Terminal Knee Extension)  Level 4 (Blue)    Terminal Knee Extension Limitations   15x10"    Lateral Step Up  Left;2 sets;10 reps;Hand Hold: 2;Step Height: 4"    Rocker Board  2 minutes;Limitations    Rocker Board Limitations  R/L, BUE support      Knee/Hip Exercises: Seated   Long Arc Quad  Left;15 reps;Limitations    Long Arc Quad Limitations  5" holds      Knee/Hip Exercises: Supine   Short Arc Quad Sets  Left;15 reps;Limitations    Short Arc Quad Sets Limitations  2#, basektball, 3-5" holds    Knee Extension Limitations  6    Knee Flexion Limitations  110      Knee/Hip Exercises: Prone   Other Prone Exercises  prone TKE 15x5" holds with 2# weight for proprioception and strength      Manual Therapy   Manual Therapy  Edema management;Joint mobilization    Manual therapy comments  performed separately from all other skilled treatments     Edema Management  retro massage for edema control    Joint Mobilization  patella mobs all directions; PA tibofemoral joint mobs for knee extension           PT Education - 04/25/18 1348    Education provided  Yes    Education Details  exercise technique, continue HEP    Person(s) Educated  Patient    Methods  Explanation;Demonstration    Comprehension  Verbalized understanding;Returned demonstration       PT Short Term Goals - 04/18/18 1214      PT SHORT TERM GOAL #1   Title  Pt Lt knee extension to be below 4 to allow a normal heel toe gait pattern    Time  3    Period  Weeks    Status  New    Target Date  05/09/18      PT SHORT TERM GOAL #2   Title  PT lt knee flexion to be to 110 to allow pt to sit for 30-40 minutes in comfort to enjoy a meal     Time  3    Period  Weeks    Status  New      PT SHORT TERM GOAL #3   Title  Pt strength in LE to be improved to allow pt to come from sit to stand without the use of UE support     Time  3    Period  Weeks    Status  New  PT SHORT TERM GOAL #4   Title  PT pain to be no grater than a 3/10 to allow pt to be comfortable walking for 20 minutes to complete  short shopping trips     Time  3    Period  Weeks    Status  New        PT Long Term Goals - 04/18/18 1217      PT LONG TERM GOAL #1   Title  PT to be able to single leg stance on both LE for at least 15 seconds to allow pt to feel confident walking without an assistive device.     Time  6    Period  Weeks    Status  New      PT LONG TERM GOAL #2   Title  PT strength in LT LE to be increased to allow pt to ascend and descend 10 steps in a reciprocal manner.     Time  6    Period  Weeks    Status  New      PT LONG TERM GOAL #3   Title  Pt pain level to be no greater than a 1/10 in his lt knee to allow him to walk for over an hour to go out to functions/shopping.     Time  6    Period  Weeks    Status  New      PT LONG TERM GOAL #4   Title  PT Lt knee ROM to improve to 120  allow pt to be able to squat to pick items off the floor with ease     Time  6    Period  Weeks    Status  New            Plan - 04/25/18 1428    Clinical Impression Statement  Pt 4 weeks post-op as of 04/24/18. Continued with established POC focusing on knee mobility, extension > flexion, as well as addressing edema and patellar mobility. Added lateral step ups for quad strength in order to improve knee extension ROM. He reported some anterior knee pain with TKE but not very bad. Also added LAQ and weights to SAQ for quad. Ended with manual for patellar joint mobs and edema control. Slight improvement in extension ROM as his AROM was 6 to 110deg this date. Continue as planned, progressing as able.     Rehab Potential  Good    PT Frequency  3x / week    PT Duration  6 weeks    PT Treatment/Interventions  ADLs/Self Care Home Management;Cryotherapy;Therapeutic activities;Therapeutic exercise;Balance training;Gait training;Stair training;Functional mobility training;Patient/family education;Manual techniques    PT Next Visit Plan  continue prone quad stretch and begin prone knee hangs; Continue focus with  knee mobilty, extension > flexion.  Continue to address quad strengthening and functional strengthening as appropriate.  Manual for scar tissue mobilizaitn as appropriate.     PT Home Exercise Plan  eval:  quad set, active hamstring stretch and heelslide     Consulted and Agree with Plan of Care  Patient       Patient will benefit from skilled therapeutic intervention in order to improve the following deficits and impairments:  Abnormal gait, Decreased activity tolerance, Decreased balance, Decreased mobility, Decreased range of motion, Difficulty walking, Increased edema, Pain, Impaired perceived functional ability  Visit Diagnosis: Stiffness of left knee, not elsewhere classified  Localized edema  Difficulty in walking, not elsewhere classified  Muscle weakness (  generalized)     Problem List Patient Active Problem List   Diagnosis Date Noted  . Primary localized osteoarthritis of left knee 03/27/2018  . S/P total knee arthroplasty 08/30/2016  . Weakness of left leg 03/13/2014  . Difficulty in walking(719.7) 03/13/2014  . Arthritis of knee, left 01/21/2014  . Cervicalgia 11/08/2011  . RHEUMATOID ARTHRITIS 02/01/2011  . RUPTURE ROTATOR CUFF 02/01/2011  . IMPINGEMENT SYNDROME 12/07/2010  . Primary localized osteoarthritis of right knee 09/28/2009  . Essential hypertension 05/13/2009       Geraldine Solar PT, DPT  [facility] [address] [place], [place], [address] Phone: [phone]   Fax:  [phone]  Name: Nicholas Delacruz MRN: [id number] Date of Birth: January 25, 1966

## 2018-04-27 ENCOUNTER — Telehealth (HOSPITAL_COMMUNITY): Payer: Self-pay

## 2018-04-27 ENCOUNTER — Ambulatory Visit (HOSPITAL_COMMUNITY): Payer: 59

## 2018-04-27 ENCOUNTER — Encounter (HOSPITAL_COMMUNITY): Payer: Self-pay

## 2018-04-27 DIAGNOSIS — R262 Difficulty in walking, not elsewhere classified: Secondary | ICD-10-CM

## 2018-04-27 DIAGNOSIS — R600 Localized edema: Secondary | ICD-10-CM

## 2018-04-27 DIAGNOSIS — M25662 Stiffness of left knee, not elsewhere classified: Secondary | ICD-10-CM | POA: Diagnosis not present

## 2018-04-27 DIAGNOSIS — M6281 Muscle weakness (generalized): Secondary | ICD-10-CM

## 2018-04-27 NOTE — Therapy (Signed)
[facility] [address] [place], [place], [address] Phone: [phone]   Fax:  [phone]  Physical Therapy Treatment  Patient Details  Name: STEPFON Delacruz MRN: [id number] Date of Birth: [birth year] Referring Provider: Marchia Delacruz   Encounter Date: 04/27/2018  PT End of Session - 04/27/18 1651    Visit Number  5    Number of Visits  18    Date for PT Re-Evaluation  05/30/18 Mini-reassess 05/09/18    Authorization Type  [employer]    Authorization Time Period  4/24-->05/30/2018    Authorization - Visit Number  5    Authorization - Number of Visits  60 after 30 needs medical review    PT Start Time  0160    PT Stop Time  1728    PT Time Calculation (min)  41 min    Activity Tolerance  Patient tolerated treatment well;Patient limited by pain    Behavior During Therapy  [facility] for tasks assessed/performed       Past Medical History:  Diagnosis Date  . Arthritis   . Diabetes mellitus without complication ([place])   . GERD (gastroesophageal reflux disease)   . High blood pressure   . Obstructive sleep apnea on CPAP    BIPAP  . PONV (postoperative nausea and vomiting)    blood pressure dropped in recovery  . Primary localized osteoarthritis of left knee 03/27/2018    Past Surgical History:  Procedure Laterality Date  . APPENDECTOMY    . KNEE ARTHROPLASTY    . KNEE ARTHROSCOPY Right 01/02/2015   Procedure: RIGHT KNEE ARTHROSCOPY, LATERAL AND MEDIAL MENISECTOMY;  Surgeon: Nicholas Civil, MD;  [facility];  Service: Orthopedics;  Laterality: Right;  . KNEE ARTHROSCOPY WITH MEDIAL MENISECTOMY Left 06/25/2014   Procedure: KNEE ARTHROSCOPY WITH MEDIAL MENISECTOMY;  Surgeon: Nicholas Civil, MD;  Location[facility];  Service: Orthopedics;  Laterality: Left;  . KNEE SURGERY Left   . MENISCUS DEBRIDEMENT Right 01/02/2015   Procedure: DEBRIDEMENT OF RIGHT KNEE JOINT;  Surgeon: Nicholas Civil, MD;  Location: [facility];  Service:  Orthopedics;  Laterality: Right;  . PARTIAL KNEE ARTHROPLASTY Left 03/27/2018   Procedure: UNICOMPARTMENTAL LEFT KNEE;  Surgeon: Nicholas Bond, MD;  Location: [place];  Service: Orthopedics;  Laterality: Left;  . right knee sark    . SHOULDER SURGERY     left  . TOTAL KNEE ARTHROPLASTY Right 08/30/2016   Procedure: TOTAL KNEE ARTHROPLASTY;  Surgeon: Nicholas Bond, MD;  Location: [place];  Service: Orthopedics;  Laterality: Right;  . TOTAL SHOULDER ARTHROPLASTY      There were no vitals filed for this visit.  Subjective Assessment - 04/27/18 1648    Subjective  Pt stated he is feeling rought today, reports increased swelling Lt ankle following Wednesday.  Has applied ice several times today    Patient Stated Goals  To have no pain and bend his knee better.     Currently in Pain?  Yes    Pain Score  5     Pain Location  Knee knee and ankle    Pain Orientation  Left    Pain Descriptors / Indicators  Aching    Pain Type  Acute pain    Pain Onset  1 to 4 weeks ago    Pain Frequency  Constant    Aggravating Factors   activity    Pain Relieving Factors  ice, meds    Effect of Pain on Daily  Activities  limits                       [facility] Adult PT Treatment/Exercise - 04/27/18 0001      Knee/Hip Exercises: Stretches   Active Hamstring Stretch  Left;3 reps;30 seconds;Limitations    Active Hamstring Stretch Limitations  standing 12in step    Quad Stretch  Left;3 reps;30 seconds    Quad Stretch Limitations  prone with rope    Knee: Self-Stretch to increase Flexion  Left    Knee: Self-Stretch Limitations  10x15" holds on 12" step    Gastroc Stretch  3 reps;30 seconds;Limitations    Gastroc Stretch Limitations  slant board      Knee/Hip Exercises: Standing   Terminal Knee Extension  Left;10 reps    Theraband Level (Terminal Knee Extension)  Level 4 (Blue)      Knee/Hip Exercises: Seated   Long Arc Quad  Left;15 reps;Limitations    Long Arc Quad Limitations  5" holds       Knee/Hip Exercises: Supine   Short Arc [employer]  2 sets;15 reps    Short Arc Quad Sets Limitations  2# 5" holds    Heel Slides  10 reps    Knee Extension Limitations  6    Knee Flexion Limitations  112    Other Supine Knee/Hip Exercises  ankle pumps for edema 2 setsx 10 before and after manual      Knee/Hip Exercises: Prone   Prone Knee Hang  3 minutes    Other Prone Exercises  prone TKE 15x5" holds with 2# weight for proprioception and strength      Manual Therapy   Manual Therapy  Edema management;Joint mobilization;Myofascial release;Other (comment)    Manual therapy comments  performed separately from all other skilled treatments     Edema Management  retro massage for edema control    Joint Mobilization  patella mobs all directions; PA tibofemoral joint mobs for knee extension    Myofascial Release  scar tissue massage    Other Manual Therapy  Measurement for compression hose: ankle 9.75"; calf 17"; mid-thigh 23.5"             PT Education - 04/27/18 1735    Education provided  Yes    Education Details  Educated benefits with compression hose, measurements taken and paperwork given for [place].      Person(s) Educated  Patient    Methods  Explanation;Handout    Comprehension  Verbalized understanding       PT Short Term Goals - 04/18/18 1214      PT SHORT TERM GOAL #1   Title  Pt Lt knee extension to be below 4 to allow a normal heel toe gait pattern    Time  3    Period  Weeks    Status  New    Target Date  05/09/18      PT SHORT TERM GOAL #2   Title  PT lt knee flexion to be to 110 to allow pt to sit for 30-40 minutes in comfort to enjoy a meal     Time  3    Period  Weeks    Status  New      PT SHORT TERM GOAL #3   Title  Pt strength in LE to be improved to allow pt to come from sit to stand without the use of UE support     Time  3  Period  Weeks    Status  New      PT SHORT TERM GOAL #4   Title  PT pain to be no grater than  a 3/10 to allow pt to be comfortable walking for 20 minutes to complete short shopping trips     Time  3    Period  Weeks    Status  New        PT Long Term Goals - 04/18/18 1217      PT LONG TERM GOAL #1   Title  PT to be able to single leg stance on both LE for at least 15 seconds to allow pt to feel confident walking without an assistive device.     Time  6    Period  Weeks    Status  New      PT LONG TERM GOAL #2   Title  PT strength in LT LE to be increased to allow pt to ascend and descend 10 steps in a reciprocal manner.     Time  6    Period  Weeks    Status  New      PT LONG TERM GOAL #3   Title  Pt pain level to be no greater than a 1/10 in his lt knee to allow him to walk for over an hour to go out to functions/shopping.     Time  6    Period  Weeks    Status  New      PT LONG TERM GOAL #4   Title  PT Lt knee ROM to improve to 120  allow pt to be able to squat to pick items off the floor with ease     Time  6    Period  Weeks    Status  New            Plan - 04/27/18 1719    Clinical Impression Statement  Pt arrived with reports of increased pain and edema present knee and ankle.  Began session with manual techniques to assist with edema control and pain.  Pt educated on benefits with compression hose to assist with edema control, measurements taken and paperwork given to [facility] [employer] to possible purchase.  Session focus with knee mobility.  Continued with quad strengthening exercises and stretches to improve mobility.  Added prone knee hang with manual to hamstrings to address tightness to assist with knee extension.  AROM 6-112 degrees at EOS, pt did report increased pain with standing exercises.  Reduced weight bearing activities this session, resume next session as appropriate.      Rehab Potential  Good    PT Frequency  3x / week    PT Duration  6 weeks    PT Treatment/Interventions  ADLs/Self Care Home Management;Cryotherapy;Therapeutic  activities;Therapeutic exercise;Balance training;Gait training;Stair training;Functional mobility training;Patient/family education;Manual techniques    PT Next Visit Plan  F/U on compression hose purchase, answer questions PRN; Continue focus with knee mobilty, extension > flexion.  Continue to address quad strengthening and functional strengthening as appropriate.  Manual for scar tissue mobilizaitn as appropriate.     PT Home Exercise Plan  eval:  quad set, active hamstring stretch and heelslide        Patient will benefit from skilled therapeutic intervention in order to improve the following deficits and impairments:  Abnormal gait, Decreased activity tolerance, Decreased balance, Decreased mobility, Decreased range of motion, Difficulty walking, Increased edema, Pain, Impaired perceived functional ability  Visit Diagnosis: Stiffness of left knee, not elsewhere classified  Localized edema  Difficulty in walking, not elsewhere classified  Muscle weakness (generalized)     Problem List Patient Active Problem List   Diagnosis Date Noted  . Primary localized osteoarthritis of left knee 03/27/2018  . S/P total knee arthroplasty 08/30/2016  . Weakness of left leg 03/13/2014  . Difficulty in walking(719.7) 03/13/2014  . Arthritis of knee, left 01/21/2014  . Cervicalgia 11/08/2011  . RHEUMATOID ARTHRITIS 02/01/2011  . RUPTURE ROTATOR CUFF 02/01/2011  . IMPINGEMENT SYNDROME 12/07/2010  . Primary localized osteoarthritis of right knee 09/28/2009  . Essential hypertension 05/13/2009   [place] Austin, [place]; [place]  Aldona Lento 04/27/2018, 5:35 PM  [facility] [address] [place], [place], [address] Phone: [phone]   Fax:  [phone]  Name: LEONELL LOBDELL MRN: [id number] Date of Birth: 09-10-1966

## 2018-04-27 NOTE — Telephone Encounter (Signed)
Pt. called expressing some concern.  Stated he has been on his feet a lot since last session and has increased swelling and pain in ankle.  Reviewed RICE techniques to assist with pain and edema.  [address] [place]; [facility] [phone]

## 2018-04-30 ENCOUNTER — Encounter (HOSPITAL_COMMUNITY): Payer: Self-pay

## 2018-04-30 ENCOUNTER — Ambulatory Visit (HOSPITAL_COMMUNITY): Payer: 59

## 2018-04-30 DIAGNOSIS — R262 Difficulty in walking, not elsewhere classified: Secondary | ICD-10-CM

## 2018-04-30 DIAGNOSIS — R600 Localized edema: Secondary | ICD-10-CM

## 2018-04-30 DIAGNOSIS — M25662 Stiffness of left knee, not elsewhere classified: Secondary | ICD-10-CM | POA: Diagnosis not present

## 2018-04-30 DIAGNOSIS — R7989 Other specified abnormal findings of blood chemistry: Secondary | ICD-10-CM | POA: Diagnosis not present

## 2018-04-30 NOTE — Therapy (Signed)
[facility] [address] [place], [place], [address] Phone: [phone]   Fax:  [phone]  Physical Therapy Treatment  Patient Details  Name: Nicholas Delacruz MRN: [id number] Date of Birth: March 25, 1966 Referring Provider: Marchia Bond   Encounter Date: 04/30/2018  PT End of Session - 04/30/18 1350    Visit Number  6    Number of Visits  18    Date for PT Re-Evaluation  05/30/18 Mini-reassess 05/09/18    Authorization Type  [employer]    Authorization Time Period  4/24-->05/30/2018    Authorization - Visit Number  6    Authorization - Number of Visits  60 after 30 needs medical review    PT Start Time  1346    PT Stop Time  1426    PT Time Calculation (min)  40 min    Activity Tolerance  Patient tolerated treatment well;Patient limited by pain    Behavior During Therapy  [facility] for tasks assessed/performed       Past Medical History:  Diagnosis Date  . Arthritis   . Diabetes mellitus without complication ([place])   . GERD (gastroesophageal reflux disease)   . High blood pressure   . Obstructive sleep apnea on CPAP    BIPAP  . PONV (postoperative nausea and vomiting)    blood pressure dropped in recovery  . Primary localized osteoarthritis of left knee 03/27/2018    Past Surgical History:  Procedure Laterality Date  . APPENDECTOMY    . KNEE ARTHROPLASTY    . KNEE ARTHROSCOPY Right 01/02/2015   Procedure: RIGHT KNEE ARTHROSCOPY, LATERAL AND MEDIAL MENISECTOMY;  Surgeon: Carole Civil, MD;  Location: [facility];  Service: Orthopedics;  Laterality: Right;  . KNEE ARTHROSCOPY WITH MEDIAL MENISECTOMY Left 06/25/2014   Procedure: KNEE ARTHROSCOPY WITH MEDIAL MENISECTOMY;  Surgeon: Carole Civil, MD;  Location: [facility];  Service: Orthopedics;  Laterality: Left;  . KNEE SURGERY Left   . MENISCUS DEBRIDEMENT Right 01/02/2015   Procedure: DEBRIDEMENT OF RIGHT KNEE JOINT;  Surgeon: Carole Civil, MD;  Location: [facility];  Service:  Orthopedics;  Laterality: Right;  . PARTIAL KNEE ARTHROPLASTY Left 03/27/2018   Procedure: UNICOMPARTMENTAL LEFT KNEE;  Surgeon: Marchia Bond, MD;  Location: [facility];  Service: Orthopedics;  Laterality: Left;  . right knee sark    . SHOULDER SURGERY     left  . TOTAL KNEE ARTHROPLASTY Right 08/30/2016   Procedure: TOTAL KNEE ARTHROPLASTY;  Surgeon: Marchia Bond, MD;  Location: [facility];  Service: Orthopedics;  Laterality: Right;  . TOTAL SHOULDER ARTHROPLASTY      There were no vitals filed for this visit.  Subjective Assessment - 04/30/18 1348    Subjective  Pt states that after Wednesday, he had a rough go of it. He had increased swelling Wednesday night and into Thursday. He stayed off of it all weekend and his swelling has really gone down.     Patient Stated Goals  To have no pain and bend his knee better.     Currently in Pain?  Yes    Pain Score  3     Pain Location  Knee    Pain Orientation  Left    Pain Descriptors / Indicators  Aching    Pain Type  Acute pain    Pain Onset  1 to 4 weeks ago    Pain Frequency  Constant    Aggravating Factors   activity    Pain Relieving  Factors  ice, meds    Effect of Pain on Daily Activities  limits            [facility] Adult PT Treatment/Exercise - 04/30/18 0001      Knee/Hip Exercises: Stretches   Active Hamstring Stretch  Left;3 reps;30 seconds;Limitations    Active Hamstring Stretch Limitations  standing 12in step    Knee: Self-Stretch to increase Flexion  Left    Knee: Self-Stretch Limitations  10x15" holds on 12" step    Gastroc Stretch  3 reps;30 seconds;Limitations    Gastroc Stretch Limitations  slant board    Other Knee/Hip Stretches  supine heel prop with 5# weight over knee x4 mins      Knee/Hip Exercises: Aerobic   Stationary Bike  x4 mins, seat 19, 1/2 and fwd revolutions for mobility      Knee/Hip Exercises: Standing   Heel Raises  Both;20 reps    Heel Raises Limitations  heel and toe    Terminal Knee Extension   Left;15 reps    Terminal Knee Extension Limitations  with small pink ball against wall, 10" holds      Knee/Hip Exercises: Seated   Long Arc Quad  Left;20 reps    Long [employer] Limitations  5" holds      Knee/Hip Exercises: Supine   Short Arc Quad Sets  Left;20 reps    Short Arc Quad Sets Limitations  2#, 5" holds, baseketball    Knee Extension Limitations  5    Knee Flexion Limitations  112      Knee/Hip Exercises: Prone   Prone Knee Hang  5 minutes;Limitations    Prone Knee Hang Limitations  with 2#, +manual to HS (see below)      Manual Therapy   Manual Therapy  Edema management;Soft tissue mobilization    Manual therapy comments  performed separately from all other skilled treatments     Edema Management  retro massage BLE elevated for edema control    Soft tissue mobilization  STM to HS, medial > lateral during prone knee hang           PT Education - 04/30/18 1427    Education provided  Yes    Education Details  exercise technqiue, continue ice    Person(s) Educated  Patient    Methods  Explanation;Demonstration    Comprehension  Returned demonstration;Verbalized understanding       PT Short Term Goals - 04/18/18 1214      PT SHORT TERM GOAL #1   Title  Pt Lt knee extension to be below 4 to allow a normal heel toe gait pattern    Time  3    Period  Weeks    Status  New    Target Date  05/09/18      PT SHORT TERM GOAL #2   Title  PT lt knee flexion to be to 110 to allow pt to sit for 30-40 minutes in comfort to enjoy a meal     Time  3    Period  Weeks    Status  New      PT SHORT TERM GOAL #3   Title  Pt strength in LE to be improved to allow pt to come from sit to stand without the use of UE support     Time  3    Period  Weeks    Status  New      PT SHORT TERM GOAL #4   Title  PT pain to be no grater than a 3/10 to allow pt to be comfortable walking for 20 minutes to complete short shopping trips     Time  3    Period  Weeks    Status  New         PT Long Term Goals - 04/18/18 1217      PT LONG TERM GOAL #1   Title  PT to be able to single leg stance on both LE for at least 15 seconds to allow pt to feel confident walking without an assistive device.     Time  6    Period  Weeks    Status  New      PT LONG TERM GOAL #2   Title  PT strength in LT LE to be increased to allow pt to ascend and descend 10 steps in a reciprocal manner.     Time  6    Period  Weeks    Status  New      PT LONG TERM GOAL #3   Title  Pt pain level to be no greater than a 1/10 in his lt knee to allow him to walk for over an hour to go out to functions/shopping.     Time  6    Period  Weeks    Status  New      PT LONG TERM GOAL #4   Title  PT Lt knee ROM to improve to 120  allow pt to be able to squat to pick items off the floor with ease     Time  6    Period  Weeks    Status  New            Plan - 04/30/18 1428    Clinical Impression Statement  Pt presenting to therapy with improved c/o pain compared to his session on Friday. Today's session continued with knee mobility, extension > flexion, but did not perform a lot of standing activities as PT feels this could have been what caused him to have the acute increase in swelling and pain. Added supine heel prop with weight over knee and prone knee hang with manual to L HS, medial > lateral. Continued with manual for edema management afterwards. Pt AROM 5 to 112deg this date. Continue as planned, progressing slowly as able since pt demo'ing acute increase in swelling and pain following progression on Wednesday 04/25/18 session.    Rehab Potential  Good    PT Frequency  3x / week    PT Duration  6 weeks    PT Treatment/Interventions  ADLs/Self Care Home Management;Cryotherapy;Therapeutic activities;Therapeutic exercise;Balance training;Gait training;Stair training;Functional mobility training;Patient/family education;Manual techniques    PT Next Visit Plan  F/U on compression hose purchase,  answer questions PRN; continue prone knee hand and supine heel propping; Continue focus with knee mobilty, extension > flexion.  Continue to address quad strengthening and functional strengthening as appropriate.  Manual for scar tissue mobilizaitn as appropriate.     PT Home Exercise Plan  eval:  quad set, active hamstring stretch and heelslide     Consulted and Agree with Plan of Care  Patient       Patient will benefit from skilled therapeutic intervention in order to improve the following deficits and impairments:  Abnormal gait, Decreased activity tolerance, Decreased balance, Decreased mobility, Decreased range of motion, Difficulty walking, Increased edema, Pain, Impaired perceived functional ability  Visit Diagnosis: Stiffness of left knee, not elsewhere classified  Localized edema  Difficulty in walking, not elsewhere classified     Problem List Patient Active Problem List   Diagnosis Date Noted  . Primary localized osteoarthritis of left knee 03/27/2018  . S/P total knee arthroplasty 08/30/2016  . Weakness of left leg 03/13/2014  . Difficulty in walking(719.7) 03/13/2014  . Arthritis of knee, left 01/21/2014  . Cervicalgia 11/08/2011  . RHEUMATOID ARTHRITIS 02/01/2011  . RUPTURE ROTATOR CUFF 02/01/2011  . IMPINGEMENT SYNDROME 12/07/2010  . Primary localized osteoarthritis of right knee 09/28/2009  . Essential hypertension 05/13/2009       Geraldine Solar PT, DPT  [facility] [address] [place], [place], [address] Phone: [phone]   Fax:  [phone]  Name: Nicholas Delacruz MRN: [id number] Date of Birth: 1966-06-23

## 2018-05-02 ENCOUNTER — Telehealth (HOSPITAL_COMMUNITY): Payer: Self-pay | Admitting: Family Medicine

## 2018-05-02 ENCOUNTER — Ambulatory Visit (HOSPITAL_COMMUNITY): Payer: 59

## 2018-05-02 NOTE — Telephone Encounter (Signed)
05/02/18  pt cx said his power was out last night and he sleeps with a breathing machine and power came back on at 9am but he's only had about 2 hours of sleep and feels like crap

## 2018-05-04 ENCOUNTER — Ambulatory Visit (HOSPITAL_COMMUNITY): Payer: 59

## 2018-05-04 ENCOUNTER — Encounter (HOSPITAL_COMMUNITY): Payer: Self-pay

## 2018-05-04 DIAGNOSIS — R600 Localized edema: Secondary | ICD-10-CM

## 2018-05-04 DIAGNOSIS — M25662 Stiffness of left knee, not elsewhere classified: Secondary | ICD-10-CM | POA: Diagnosis not present

## 2018-05-04 DIAGNOSIS — R262 Difficulty in walking, not elsewhere classified: Secondary | ICD-10-CM

## 2018-05-04 NOTE — Therapy (Signed)
[facility] [address] [place], [place], [address] Phone: [phone]   Fax:  [phone]  Physical Therapy Treatment  Patient Details  Name: Nicholas Delacruz MRN: [id number] Date of Birth: 11/12/1966 Referring Provider: Marchia Bond   Encounter Date: 05/04/2018  PT End of Session - 05/04/18 1349    Visit Number  7    Number of Visits  18    Date for PT Re-Evaluation  05/30/18 Mini-reassess 05/09/18    Authorization Type  [employer]    Authorization Time Period  4/24-->05/30/2018    Authorization - Visit Number  7    Authorization - Number of Visits  60 after 30 needs medical review    PT Start Time  1345    PT Stop Time  1425    PT Time Calculation (min)  40 min    Activity Tolerance  Patient tolerated treatment well;Patient limited by pain    Behavior During Therapy  [facility] for tasks assessed/performed       Past Medical History:  Diagnosis Date  . Arthritis   . Diabetes mellitus without complication ([place])   . GERD (gastroesophageal reflux disease)   . High blood pressure   . Obstructive sleep apnea on CPAP    BIPAP  . PONV (postoperative nausea and vomiting)    blood pressure dropped in recovery  . Primary localized osteoarthritis of left knee 03/27/2018    Past Surgical History:  Procedure Laterality Date  . APPENDECTOMY    . KNEE ARTHROPLASTY    . KNEE ARTHROSCOPY Right 01/02/2015   Procedure: RIGHT KNEE ARTHROSCOPY, LATERAL AND MEDIAL MENISECTOMY;  Surgeon: Carole Civil, MD;  [facility];  Service: Orthopedics;  Laterality: Right;  . KNEE ARTHROSCOPY WITH MEDIAL MENISECTOMY Left 06/25/2014   Procedure: KNEE ARTHROSCOPY WITH MEDIAL MENISECTOMY;  Surgeon: Carole Civil, MD;  Location[facility];  Service: Orthopedics;  Laterality: Left;  . KNEE SURGERY Left   . MENISCUS DEBRIDEMENT Right 01/02/2015   Procedure: DEBRIDEMENT OF RIGHT KNEE JOINT;  Surgeon: Carole Civil, MD;  [facility];  Service:  Orthopedics;  Laterality: Right;  . PARTIAL KNEE ARTHROPLASTY Left 03/27/2018   Procedure: UNICOMPARTMENTAL LEFT KNEE;  Surgeon: Marchia Bond, MD;  Location: [facility];  Service: Orthopedics;  Laterality: Left;  . right knee sark    . SHOULDER SURGERY     left  . TOTAL KNEE ARTHROPLASTY Right 08/30/2016   Procedure: TOTAL KNEE ARTHROPLASTY;  Surgeon: Marchia Bond, MD;  Location: [facility];  Service: Orthopedics;  Laterality: Right;  . TOTAL SHOULDER ARTHROPLASTY      There were no vitals filed for this visit.  Subjective Assessment - 05/04/18 1349    Subjective  Pt reports that he's alright today, he is still dealing with a lot of swelling at the end of the day.     Patient Stated Goals  To have no pain and bend his knee better.     Currently in Pain?  Yes    Pain Score  3     Pain Location  Knee    Pain Orientation  Left    Pain Descriptors / Indicators  Aching    Pain Type  Acute pain    Pain Onset  1 to 4 weeks ago    Pain Frequency  Constant    Aggravating Factors   activity    Pain Relieving Factors  ice, meds    Effect of Pain on Daily Activities  limits           [facility] Adult PT Treatment/Exercise - 05/04/18 0001      Knee/Hip Exercises: Stretches   Active Hamstring Stretch  Left;3 reps;30 seconds;Limitations    Active Hamstring Stretch Limitations  standing 12in step    Knee: Self-Stretch to increase Flexion  Left    Knee: Self-Stretch Limitations  10x15" holds on 12" step    Gastroc Stretch  3 reps;30 seconds;Limitations    Gastroc Stretch Limitations  slant board      Knee/Hip Exercises: Aerobic   Stationary Bike  x4 mins, seat 19, 1/2 and fwd revolutions for mobility      Knee/Hip Exercises: Standing   Terminal Knee Extension  Left;15 reps    Terminal Knee Extension Limitations  with small pink ball against wall, 10" holds      Knee/Hip Exercises: Supine   Knee Extension Limitations  5    Knee Flexion Limitations  114      Knee/Hip Exercises: Prone   Prone Knee  Hang  5 minutes;Limitations    Prone Knee Hang Limitations  with 2#, +manual to HS (see below)      Manual Therapy   Manual Therapy  Edema management;Soft tissue mobilization    Manual therapy comments  performed separately from all other skilled treatments     Edema Management  retro massage BLE elevated for edema control    Soft tissue mobilization  STM to HS, medial > lateral during prone knee hang          PT Education - 05/04/18 1350    Education provided  Yes    Education Details  exercise technique, get compression garment, updated HEP    Person(s) Educated  Patient    Methods  Explanation;Demonstration;Handout    Comprehension  Verbalized understanding;Returned demonstration       PT Short Term Goals - 04/18/18 1214      PT SHORT TERM GOAL #1   Title  Pt Lt knee extension to be below 4 to allow a normal heel toe gait pattern    Time  3    Period  Weeks    Status  New    Target Date  05/09/18      PT SHORT TERM GOAL #2   Title  PT lt knee flexion to be to 110 to allow pt to sit for 30-40 minutes in comfort to enjoy a meal     Time  3    Period  Weeks    Status  New      PT SHORT TERM GOAL #3   Title  Pt strength in LE to be improved to allow pt to come from sit to stand without the use of UE support     Time  3    Period  Weeks    Status  New      PT SHORT TERM GOAL #4   Title  PT pain to be no grater than a 3/10 to allow pt to be comfortable walking for 20 minutes to complete short shopping trips     Time  3    Period  Weeks    Status  New        PT Long Term Goals - 04/18/18 1217      PT LONG TERM GOAL #1   Title  PT to be able to single leg stance on both LE for at least 15 seconds to allow pt to feel confident walking without an  assistive device.     Time  6    Period  Weeks    Status  New      PT LONG TERM GOAL #2   Title  PT strength in LT LE to be increased to allow pt to ascend and descend 10 steps in a reciprocal manner.     Time  6     Period  Weeks    Status  New      PT LONG TERM GOAL #3   Title  Pt pain level to be no greater than a 1/10 in his lt knee to allow him to walk for over an hour to go out to functions/shopping.     Time  6    Period  Weeks    Status  New      PT LONG TERM GOAL #4   Title  PT Lt knee ROM to improve to 120  allow pt to be able to squat to pick items off the floor with ease     Time  6    Period  Weeks    Status  New            Plan - 05/04/18 1428    Clinical Impression Statement  Pt continues to present to therapy with c/o increased swelling on LLE. PT inquired about his compression garment referral and he stated that he has not yet done anything with it; PT encouraged him to pursue that to help with his LLE edema and he verbalized understanding. Began session focusing on knee mobility and then majority of session was focused on addressing edema in LLE. Distal scar slightly hypomobile so addressed that during manual and noted mild pitting edema where pressure wa applied indicating increased fluid in that area. Performed retro massage for majority of session. Pt's AROM 5 to 114deg this date. F/u on if pt called or ordered compression garment and continue to address edema and progressing ROM as tolerated.    Rehab Potential  Good    PT Frequency  3x / week    PT Duration  6 weeks    PT Treatment/Interventions  ADLs/Self Care Home Management;Cryotherapy;Therapeutic activities;Therapeutic exercise;Balance training;Gait training;Stair training;Functional mobility training;Patient/family education;Manual techniques    PT Next Visit Plan  F/U on compression hose purchase, answer questions PRN; continue manual for edema control and ROM deficits; continue prone knee hand and supine heel propping; Continue focus with knee mobilty, extension > flexion.  Continue to address quad strengthening and functional strengthening as appropriate.    PT Home Exercise Plan  eval:  quad set, active hamstring  stretch and heelslide     Consulted and Agree with Plan of Care  Patient       Patient will benefit from skilled therapeutic intervention in order to improve the following deficits and impairments:  Abnormal gait, Decreased activity tolerance, Decreased balance, Decreased mobility, Decreased range of motion, Difficulty walking, Increased edema, Pain, Impaired perceived functional ability  Visit Diagnosis: Stiffness of left knee, not elsewhere classified  Localized edema  Difficulty in walking, not elsewhere classified     Problem List Patient Active Problem List   Diagnosis Date Noted  . Primary localized osteoarthritis of left knee 03/27/2018  . S/P total knee arthroplasty 08/30/2016  . Weakness of left leg 03/13/2014  . Difficulty in walking(719.7) 03/13/2014  . Arthritis of knee, left 01/21/2014  . Cervicalgia 11/08/2011  . RHEUMATOID ARTHRITIS 02/01/2011  . RUPTURE ROTATOR CUFF 02/01/2011  . IMPINGEMENT SYNDROME 12/07/2010  .  Primary localized osteoarthritis of right knee 09/28/2009  . Essential hypertension 05/13/2009        Geraldine Solar PT, DPT  [facility] [address] [place], [place], [address] Phone: [phone]   Fax:  [phone]  Name: Nicholas Delacruz MRN: [id number] Date of Birth: 26-Oct-1966

## 2018-05-06 DIAGNOSIS — G4733 Obstructive sleep apnea (adult) (pediatric): Secondary | ICD-10-CM | POA: Diagnosis not present

## 2018-05-07 ENCOUNTER — Ambulatory Visit (HOSPITAL_COMMUNITY): Payer: 59

## 2018-05-07 ENCOUNTER — Encounter (HOSPITAL_COMMUNITY): Payer: Self-pay

## 2018-05-07 DIAGNOSIS — R262 Difficulty in walking, not elsewhere classified: Secondary | ICD-10-CM

## 2018-05-07 DIAGNOSIS — R600 Localized edema: Secondary | ICD-10-CM

## 2018-05-07 DIAGNOSIS — M6281 Muscle weakness (generalized): Secondary | ICD-10-CM

## 2018-05-07 DIAGNOSIS — M25662 Stiffness of left knee, not elsewhere classified: Secondary | ICD-10-CM | POA: Diagnosis not present

## 2018-05-07 NOTE — Therapy (Signed)
[facility] [address] [place], [place], [address] Phone: [phone]   Fax:  [phone]  Physical Therapy Treatment  Patient Details  Name: Nicholas Delacruz MRN: [id number] Date of Birth: June 27, 1966 Referring Provider: Marchia Bond   Encounter Date: 05/07/2018  PT End of Session - 05/07/18 1354    Visit Number  8    Number of Visits  18    Date for PT Re-Evaluation  05/30/18 Mini-reassess 05/09/18    Authorization Type  [employer]    Authorization Time Period  4/24-->05/30/2018    Authorization - Visit Number  8    Authorization - Number of Visits  60 after 30 needs medical review    PT Start Time  0347 pt arrived few mins late    PT Stop Time  1432    PT Time Calculation (min)  39 min    Activity Tolerance  Patient tolerated treatment well;Patient limited by pain    Behavior During Therapy  [facility] for tasks assessed/performed       Past Medical History:  Diagnosis Date  . Arthritis   . Diabetes mellitus without complication ([place])   . GERD (gastroesophageal reflux disease)   . High blood pressure   . Obstructive sleep apnea on CPAP    BIPAP  . PONV (postoperative nausea and vomiting)    blood pressure dropped in recovery  . Primary localized osteoarthritis of left knee 03/27/2018    Past Surgical History:  Procedure Laterality Date  . APPENDECTOMY    . KNEE ARTHROPLASTY    . KNEE ARTHROSCOPY Right 01/02/2015   Procedure: RIGHT KNEE ARTHROSCOPY, LATERAL AND MEDIAL MENISECTOMY;  Surgeon: Carole Civil, MD;  Location: [facility];  Service: Orthopedics;  Laterality: Right;  . KNEE ARTHROSCOPY WITH MEDIAL MENISECTOMY Left 06/25/2014   Procedure: KNEE ARTHROSCOPY WITH MEDIAL MENISECTOMY;  Surgeon: Carole Civil, MD;  Location: [facility];  Service: Orthopedics;  Laterality: Left;  . KNEE SURGERY Left   . MENISCUS DEBRIDEMENT Right 01/02/2015   Procedure: DEBRIDEMENT OF RIGHT KNEE JOINT;  Surgeon: Carole Civil, MD;   Location: [facility];  Service: Orthopedics;  Laterality: Right;  . PARTIAL KNEE ARTHROPLASTY Left 03/27/2018   Procedure: UNICOMPARTMENTAL LEFT KNEE;  Surgeon: Marchia Bond, MD;  Location: [facility];  Service: Orthopedics;  Laterality: Left;  . right knee sark    . SHOULDER SURGERY     left  . TOTAL KNEE ARTHROPLASTY Right 08/30/2016   Procedure: TOTAL KNEE ARTHROPLASTY;  Surgeon: Marchia Bond, MD;  Location: [facility];  Service: Orthopedics;  Laterality: Right;  . TOTAL SHOULDER ARTHROPLASTY      There were no vitals filed for this visit.  Subjective Assessment - 05/07/18 1354    Subjective  Pt states that he saw his surgeon, Dr. Mardelle Matte today. He reports that Dr. Mardelle Matte was pleased with his current progress and reassured the pt that the swelling he is having is not uncommon. He was pleased with his range thus far.     Patient Stated Goals  To have no pain and bend his knee better.     Currently in Pain?  Yes    Pain Score  2     Pain Location  Knee    Pain Orientation  Left    Pain Descriptors / Indicators  Aching    Pain Type  Acute pain    Pain Onset  1 to 4 weeks ago    Pain Frequency  Constant  Aggravating Factors   activity    Pain Relieving Factors  ice, meds    Effect of Pain on Daily Activities  limits         [facility] PT Assessment - 05/07/18 0001      Assessment   Medical Diagnosis  LT partial knee replacement     Referring Provider  Marchia Bond    Onset Date/Surgical Date  06/11/18           [facility] Adult PT Treatment/Exercise - 05/07/18 0001      Knee/Hip Exercises: Stretches   Active Hamstring Stretch  Left;3 reps;30 seconds;Limitations    Active Hamstring Stretch Limitations  standing 12in step    Quad Stretch  Left;3 reps;30 seconds    Quad Stretch Limitations  prone with rope    Knee: Self-Stretch to increase Flexion  Left    Knee: Self-Stretch Limitations  10x15" holds on 12" step    Gastroc Stretch  3 reps;30 seconds;Limitations    Gastroc Stretch  Limitations  slant board      Knee/Hip Exercises: Aerobic   Stationary Bike  x4 mins, seat 19, 1/2 and fwd revolutions for mobility      Knee/Hip Exercises: Standing   Hip Abduction  Both;10 reps    Abduction Limitations  BLE, GTB    Lateral Step Up  Left;10 reps;Step Height: 4"    Forward Step Up  Left;10 reps;Step Height: 4"    SLS  5x10", RLE      Knee/Hip Exercises: Supine   Short Arc Quad Sets  Left;20 reps    Short Arc Quad Sets Limitations  3#, 5" holds, basketball    Knee Extension Limitations  5    Knee Flexion Limitations  116      Knee/Hip Exercises: Prone   Hamstring Curl  20 reps;Limitations    Hamstring Curl Limitations  2#    Prone Knee Hang  5 minutes;Limitations    Prone Knee Hang Limitations  with 2#, +manual to HS (see below)      Manual Therapy   Manual Therapy  Edema management;Soft tissue mobilization    Manual therapy comments  performed separately from all other skilled treatments     Edema Management  retro massage BLE elevated for edema control    Soft tissue mobilization  STM to HS during prone knee hang            PT Education - 05/07/18 1431    Education provided  Yes    Education Details  exercise technique, updated HEP    Person(s) Educated  Patient    Methods  Explanation;Demonstration;Handout    Comprehension  Verbalized understanding;Returned demonstration       PT Short Term Goals - 04/18/18 1214      PT SHORT TERM GOAL #1   Title  Pt Lt knee extension to be below 4 to allow a normal heel toe gait pattern    Time  3    Period  Weeks    Status  New    [place] Date  05/09/18      PT SHORT TERM GOAL #2   Title  PT lt knee flexion to be to 110 to allow pt to sit for 30-40 minutes in comfort to enjoy a meal     Time  3    Period  Weeks    Status  New      PT SHORT TERM GOAL #3   Title  Pt strength in LE to be improved  to allow pt to come from sit to stand without the use of UE support     Time  3    Period  Weeks    Status   New      PT SHORT TERM GOAL #4   Title  PT pain to be no grater than a 3/10 to allow pt to be comfortable walking for 20 minutes to complete short shopping trips     Time  3    Period  Weeks    Status  New        PT Long Term Goals - 04/18/18 1217      PT LONG TERM GOAL #1   Title  PT to be able to single leg stance on both LE for at least 15 seconds to allow pt to feel confident walking without an assistive device.     Time  6    Period  Weeks    Status  New      PT LONG TERM GOAL #2   Title  PT strength in LT LE to be increased to allow pt to ascend and descend 10 steps in a reciprocal manner.     Time  6    Period  Weeks    Status  New      PT LONG TERM GOAL #3   Title  Pt pain level to be no greater than a 1/10 in his lt knee to allow him to walk for over an hour to go out to functions/shopping.     Time  6    Period  Weeks    Status  New      PT LONG TERM GOAL #4   Title  PT Lt knee ROM to improve to 120  allow pt to be able to squat to pick items off the floor with ease     Time  6    Period  Weeks    Status  New            Plan - 05/07/18 1436    Clinical Impression Statement  Session limited slightly as he arrived a few mins late for his appointment. He saw Dr. Mardelle Matte this morning prior to his PT session and he reports that the MD was pleased with everything thus far and told him that the swelling he was having was normal. Session continued to focus improving knee mobility and strengthening. Resumed step ups this date and added standing hip abd for hip strengthening. Ended with manual for edema control. AROM 5 to116 deg at EOS. Updated HEP today. Continue as planned, progressing as able. Pt due for reassessment next visit.     Rehab Potential  Good    PT Frequency  3x / week    PT Duration  6 weeks    PT Treatment/Interventions  ADLs/Self Care Home Management;Cryotherapy;Therapeutic activities;Therapeutic exercise;Balance training;Gait training;Stair  training;Functional mobility training;Patient/family education;Manual techniques    PT Next Visit Plan  reassessment; F/U on compression hose purchase, answer questions PRN; continue manual for edema control and ROM deficits; continue prone knee hand and supine heel propping; Continue focus with knee mobilty, extension > flexion.  Continue to address quad strengthening and begin to slowly increase functional strengthening as appropriate.    PT Home Exercise Plan  eval:  quad set, active hamstring stretch and heelslide; 5/13: prone quad stretch, prone knee hang, SAQ    Consulted and Agree with Plan of Care  Patient  Patient will benefit from skilled therapeutic intervention in order to improve the following deficits and impairments:  Abnormal gait, Decreased activity tolerance, Decreased balance, Decreased mobility, Decreased range of motion, Difficulty walking, Increased edema, Pain, Impaired perceived functional ability  Visit Diagnosis: Stiffness of left knee, not elsewhere classified  Localized edema  Difficulty in walking, not elsewhere classified  Muscle weakness (generalized)     Problem List Patient Active Problem List   Diagnosis Date Noted  . Primary localized osteoarthritis of left knee 03/27/2018  . S/P total knee arthroplasty 08/30/2016  . Weakness of left leg 03/13/2014  . Difficulty in walking(719.7) 03/13/2014  . Arthritis of knee, left 01/21/2014  . Cervicalgia 11/08/2011  . RHEUMATOID ARTHRITIS 02/01/2011  . RUPTURE ROTATOR CUFF 02/01/2011  . IMPINGEMENT SYNDROME 12/07/2010  . Primary localized osteoarthritis of right knee 09/28/2009  . Essential hypertension 05/13/2009       Geraldine Solar PT, DPT  [facility] [address] [place], [place], [address] Phone: [phone]   Fax:  [phone]  Name: Nicholas Delacruz MRN: [id number] Date of Birth: 28-Dec-1965

## 2018-05-09 ENCOUNTER — Ambulatory Visit (HOSPITAL_COMMUNITY): Payer: 59

## 2018-05-09 ENCOUNTER — Encounter (HOSPITAL_COMMUNITY): Payer: Self-pay

## 2018-05-09 DIAGNOSIS — R262 Difficulty in walking, not elsewhere classified: Secondary | ICD-10-CM

## 2018-05-09 DIAGNOSIS — M25662 Stiffness of left knee, not elsewhere classified: Secondary | ICD-10-CM | POA: Diagnosis not present

## 2018-05-09 DIAGNOSIS — M6281 Muscle weakness (generalized): Secondary | ICD-10-CM

## 2018-05-09 DIAGNOSIS — R600 Localized edema: Secondary | ICD-10-CM

## 2018-05-09 NOTE — Therapy (Addendum)
[facility] [place], [place], [address] Phone: [phone]   Fax:  [phone]  Physical Therapy Treatment  Patient Details  Name: Nicholas Delacruz MRN: [id number] Date of Birth: [birth year] Referring Provider: Marchia Bond   Encounter Date: 05/09/2018  PT End of Session - 05/09/18 1817    Visit Number  9    Number of Visits  18    Date for PT Re-Evaluation  05/30/18 mini-reassessment complete 05/09/18    Authorization Type  [employer]    Authorization Time Period  4/24-->05/30/2018    Authorization - Visit Number  9    Authorization - Number of Visits  60 after 30 needed medical review    PT Start Time  3557 4' on bike, no charge, FOTO complete while on bike    PT Stop Time  1818    PT Time Calculation (min)  44 min    Activity Tolerance  Patient tolerated treatment well;Patient limited by pain    Behavior During Therapy  [facility] for tasks assessed/performed       Past Medical History:  Diagnosis Date  . Arthritis   . Diabetes mellitus without complication ([place])   . GERD (gastroesophageal reflux disease)   . High blood pressure   . Obstructive sleep apnea on CPAP    BIPAP  . PONV (postoperative nausea and vomiting)    blood pressure dropped in recovery  . Primary localized osteoarthritis of left knee 03/27/2018    Past Surgical History:  Procedure Laterality Date  . APPENDECTOMY    . KNEE ARTHROPLASTY    . KNEE ARTHROSCOPY Right 01/02/2015   Procedure: RIGHT KNEE ARTHROSCOPY, LATERAL AND MEDIAL MENISECTOMY;  Surgeon: Carole Civil, MD;  Location: [facility];  Service: Orthopedics;  Laterality: Right;  . KNEE ARTHROSCOPY WITH MEDIAL MENISECTOMY Left 06/25/2014   Procedure: KNEE ARTHROSCOPY WITH MEDIAL MENISECTOMY;  Surgeon: Carole Civil, MD;  Location: [facility];  Service: Orthopedics;  Laterality: Left;  . KNEE SURGERY Left   . MENISCUS DEBRIDEMENT Right 01/02/2015   Procedure: DEBRIDEMENT OF RIGHT KNEE JOINT;   Surgeon: Carole Civil, MD;  Location: [facility];  Service: Orthopedics;  Laterality: Right;  . PARTIAL KNEE ARTHROPLASTY Left 03/27/2018   Procedure: UNICOMPARTMENTAL LEFT KNEE;  Surgeon: Marchia Bond, MD;  Location: [facility];  Service: Orthopedics;  Laterality: Left;  . right knee sark    . SHOULDER SURGERY     left  . TOTAL KNEE ARTHROPLASTY Right 08/30/2016   Procedure: TOTAL KNEE ARTHROPLASTY;  Surgeon: Marchia Bond, MD;  Location: [facility];  Service: Orthopedics;  Laterality: Right;  . TOTAL SHOULDER ARTHROPLASTY      There were no vitals filed for this visit.  Subjective Assessment - 05/09/18 1741    Subjective  Pt stated he reduced standing on LE over weekend and reports decreased edema and pain, current pain scale 2/10 achey pain.    How long can you sit comfortably?  Able to sit for 20-30 minutes(was 10 minutes)    How long can you stand comfortably?  Able to stand 10-15 minutes (5 minutes     How long can you walk comfortably?  Able to walk for 10-15 minutes (was Walking with a quad cane able to walk 5-10 minutes)    Currently in Pain?  Yes    Pain Score  2     Pain Location  Knee    Pain Orientation  Left    Pain Descriptors /  Indicators  Aching    Pain Type  Acute pain         [facility] PT Assessment - 05/09/18 0001      Assessment   Medical Diagnosis  LT partial knee replacement     Referring Provider  Marchia Bond    Onset Date/Surgical Date  06/11/18    Next MD Visit  06/11/2018    Prior Therapy  HH      Observation/Other Assessments   Focus on Therapeutic Outcomes (FOTO)   45 was 28      Sit to Stand   Comments  5 STS no HHA 22.48"      AROM   AROM Assessment Site  Knee    Right/Left Knee  Left    Left Knee Extension  5 was 10    Left Knee Flexion  117 was 97      Strength   Right Hip Flexion  5/5    Right Hip Extension  5/5    Left Hip Flexion  5/5    Left Hip Extension  4/5 was 4-/5    Left Hip ABduction  5/5    Right/Left Knee  Right;Left    Right  Knee Flexion  5/5    Right Knee Extension  5/5    Left Knee Flexion  4+/5 was 4/5    Left Knee Extension  5/5                   [facility] Adult PT Treatment/Exercise - 05/09/18 0001      Knee/Hip Exercises: Stretches   Active Hamstring Stretch  Left;3 reps;30 seconds;Limitations    Active Hamstring Stretch Limitations  standing 12in step    Quad Stretch  Left;3 reps;30 seconds    Quad Stretch Limitations  prone with rope    Knee: Self-Stretch to increase Flexion  Left    Knee: Self-Stretch Limitations  10x15" holds on 12" step    Gastroc Stretch  3 reps;30 seconds;Limitations    Gastroc Stretch Limitations  slant board      Knee/Hip Exercises: Aerobic   Stationary Bike  78mn seat 19 full revolution; not included in charges.  Asked FOTO questions while on bike this session      Knee/Hip Exercises: Standing   Lateral Step Up  Limitations    Lateral Step Up Limitations  resume next session    Forward Step Up  Limitations    Forward Step Up Limitations  resume next session    Stairs  reviewed mechanics with stairs; step to pattern wtih 7in; able to complete reciprocal pattern on 4in step height with 2 HHA    SLS  Lt 21", Rt 26" max of 3      Knee/Hip Exercises: Supine   Short Arc Quad Sets  Left;20 reps    Short Arc Quad Sets Limitations  3#, 5" holds    Knee Extension  AROM;Limitations    Knee Extension Limitations  6    Knee Flexion  AROM;Limitations    Knee Flexion Limitations  117      Manual Therapy   Manual Therapy  Edema management    Manual therapy comments  performed separately from all other skilled treatments     Edema Management  retro massage BLE elevated for edema control               PT Short Term Goals - 05/09/18 1740      PT SHORT TERM GOAL #1   Title  Pt  Lt knee extension to be below 4 to allow a normal heel toe gait pattern    Baseline  5/15: AROM 6-117 degrees    Status  Partially Met      PT SHORT TERM GOAL #2   Title  PT lt knee  flexion to be to 110 to allow pt to sit for 30-40 minutes in comfort to enjoy a meal     Baseline  5/15: AROM 6-117 degrees, able to sit for 20-30 minutes    Status  Partially Met      PT SHORT TERM GOAL #3   Title  Pt strength in LE to be improved to allow pt to come from sit to stand without the use of UE support     Baseline  05/09/18: improving, continues to have gluteal weakness.  Able to complete 5STS no HHA in 22.48"    Status  Achieved      PT SHORT TERM GOAL #4   Title  PT pain to be no grater than a 3/10 to allow pt to be comfortable walking for 20 minutes to complete short shopping trips     Baseline  05/09/18: average pain scale range 3-4/10, able to walk for 10-15 minutes    Status  On-going        PT Long Term Goals - 05/09/18 1747      PT LONG TERM GOAL #1   Title  PT to be able to single leg stance on both LE for at least 15 seconds to allow pt to feel confident walking without an assistive device.     Baseline  05/09/18: Best SLS 3 reps Lt: 17", 21", 10" Rt: 26", 12", 7"    Status  Achieved      PT LONG TERM GOAL #2   Title  PT strength in LT LE to be increased to allow pt to ascend and descend 10 steps in a reciprocal manner.     Baseline  05/09/18: pt continues to demonstrate difficulty with reciprocal manner,  Able to complete reciprocal manner with 2HHA and 4in step height; step to pattern ascending 7in step, unable to complete descending    Status  On-going      PT LONG TERM GOAL #3   Title  Pt pain level to be no greater than a 1/10 in his lt knee to allow him to walk for over an hour to go out to functions/shopping.     Baseline  05/09/18:  Average pain scale range 3-4/10, able to walk for 10-15 minutes    Status  On-going      PT LONG TERM GOAL #4   Title  PT Lt knee ROM to improve to 120  allow pt to be able to squat to pick items off the floor with ease     Baseline  05/09/18: AROM 6-117 degrees    Status  On-going            Plan - 05/09/18 1827     Clinical Impression Statement  Reviewed goals per mini-reassessment today.  Pt progressing well with reports of compliance wiht HEP, improved tolerance with sitting, standing and walking.  Pt reports he reduced weight bearing over weekend and decreased edema and pain.  Pt reports MD suggests compression hose as well though has not purchased, reviewed benefits for pain and edema control.  Improved AROM and strengthening overall.  AROM 6-117 degrees.  Pt main weakness with gluteal musculautre 4/5.  Pt was able to complete 5  STS without HHA in 22.48" from average chair height.  Pt continues to demonstrate some pain, edema present proximal knee, knee mobility and funcitonal weakness including stairs and STS with less effort.      Rehab Potential  Good    PT Frequency  3x / week    PT Duration  6 weeks    PT Treatment/Interventions  ADLs/Self Care Home Management;Cryotherapy;Therapeutic activities;Therapeutic exercise;Balance training;Gait training;Stair training;Functional mobility training;Patient/family education;Manual techniques    PT Next Visit Plan  F/U on compression hose purchase, answer questions PRN; continue manual for edema control and ROM deficits; continue prone knee hand and supine heel propping; Continue focus with knee mobilty, extension > flexion.  Continue to address ROM and increase functional strengthening as appropriate.    PT Home Exercise Plan  eval:  quad set, active hamstring stretch and heelslide; 5/13: prone quad stretch, prone knee hang, SAQ       Patient will benefit from skilled therapeutic intervention in order to improve the following deficits and impairments:  Abnormal gait, Decreased activity tolerance, Decreased balance, Decreased mobility, Decreased range of motion, Difficulty walking, Increased edema, Pain, Impaired perceived functional ability  Visit Diagnosis: Stiffness of left knee, not elsewhere classified  Localized edema  Difficulty in walking, not elsewhere  classified  Muscle weakness (generalized)     Problem List Patient Active Problem List   Diagnosis Date Noted  . Primary localized osteoarthritis of left knee 03/27/2018  . S/P total knee arthroplasty 08/30/2016  . Weakness of left leg 03/13/2014  . Difficulty in walking(719.7) 03/13/2014  . Arthritis of knee, left 01/21/2014  . Cervicalgia 11/08/2011  . RHEUMATOID ARTHRITIS 02/01/2011  . RUPTURE ROTATOR CUFF 02/01/2011  . IMPINGEMENT SYNDROME 12/07/2010  . Primary localized osteoarthritis of right knee 09/28/2009  . Essential hypertension 05/13/2009   Ihor Austin, [place]; [place]  Rayetta Humphrey, PT CLT [phone] 05/09/2018, 6:33 PM  [facility] [address] [place], [place], [address] Phone: [phone]   Fax:  [phone]  Name: Nicholas Delacruz MRN: [id number] Date of Birth: 1966-09-10

## 2018-05-11 ENCOUNTER — Encounter (HOSPITAL_COMMUNITY): Payer: Self-pay

## 2018-05-11 ENCOUNTER — Ambulatory Visit (HOSPITAL_COMMUNITY): Payer: 59

## 2018-05-11 DIAGNOSIS — M6281 Muscle weakness (generalized): Secondary | ICD-10-CM

## 2018-05-11 DIAGNOSIS — R600 Localized edema: Secondary | ICD-10-CM

## 2018-05-11 DIAGNOSIS — R262 Difficulty in walking, not elsewhere classified: Secondary | ICD-10-CM

## 2018-05-11 DIAGNOSIS — M25662 Stiffness of left knee, not elsewhere classified: Secondary | ICD-10-CM

## 2018-05-11 NOTE — Patient Instructions (Signed)
Knee Extension: Quad Set (Eccentric), (Resistance Band)    Stand holding support, tubing behind fully extended knee. Slowly bend knee for 3-5 seconds. Use blue resistance band. 10-15 reps per set, 1-2 sets per day.  http://ecce.exer.us/132   Copyright  VHI. All rights reserved.

## 2018-05-11 NOTE — Therapy (Signed)
[place], [place], [address] Phone: [phone]   Fax:  [phone]  Physical Therapy Treatment  Patient Details  Name: Nicholas Delacruz MRN: [id number] Date of Birth: 06-25-1966 Referring Provider: Marchia Bond   Encounter Date: 05/11/2018  PT End of Session - 05/11/18 1438    Visit Number  10    Number of Visits  18    Date for PT Re-Evaluation  05/30/18 mini-reassessment 05/09/18    Authorization Type  [employer]    Authorization Time Period  4/24-->05/30/2018    Authorization - Visit Number  10    Authorization - Number of Visits  60 after 30 need medical review    PT Start Time  1607 4' on bike, no charge    PT Stop Time  55 Pt had to leave early for dentist apt    PT Time Calculation (min)  32 min    Activity Tolerance  Patient tolerated treatment well;No increased pain    Behavior During Therapy  WFL for tasks assessed/performed       Past Medical History:  Diagnosis Date  . Arthritis   . Diabetes mellitus without complication ([place])   . GERD (gastroesophageal reflux disease)   . High blood pressure   . Obstructive sleep apnea on CPAP    BIPAP  . PONV (postoperative nausea and vomiting)    blood pressure dropped in recovery  . Primary localized osteoarthritis of left knee 03/27/2018    Past Surgical History:  Procedure Laterality Date  . APPENDECTOMY    . KNEE ARTHROPLASTY    . KNEE ARTHROSCOPY Right 01/02/2015   Procedure: RIGHT KNEE ARTHROSCOPY, LATERAL AND MEDIAL MENISECTOMY;  Surgeon: Carole Civil, MD;  Location: [facility];  Service: Orthopedics;  Laterality: Right;  . KNEE ARTHROSCOPY WITH MEDIAL MENISECTOMY Left 06/25/2014   Procedure: KNEE ARTHROSCOPY WITH MEDIAL MENISECTOMY;  Surgeon: Carole Civil, MD;  Location: [facility];  Service: Orthopedics;  Laterality: Left;  . KNEE SURGERY Left   . MENISCUS DEBRIDEMENT Right 01/02/2015   Procedure: DEBRIDEMENT OF RIGHT KNEE JOINT;   Surgeon: Carole Civil, MD;  Location: [facility];  Service: Orthopedics;  Laterality: Right;  . PARTIAL KNEE ARTHROPLASTY Left 03/27/2018   Procedure: UNICOMPARTMENTAL LEFT KNEE;  Surgeon: Marchia Bond, MD;  Location: [facility];  Service: Orthopedics;  Laterality: Left;  . right knee sark    . SHOULDER SURGERY     left  . TOTAL KNEE ARTHROPLASTY Right 08/30/2016   Procedure: TOTAL KNEE ARTHROPLASTY;  Surgeon: Marchia Bond, MD;  Location: [facility];  Service: Orthopedics;  Laterality: Right;  . TOTAL SHOULDER ARTHROPLASTY      There were no vitals filed for this visit.  Subjective Assessment - 05/11/18 1434    Subjective  Pt stated knee is a little sore, achey and stiffness today, pain scale 2-3/10.  Pt stated occassionally when walking will get pain in knee.  Pt with dentist apt today, needs to leave a little early today.      Pertinent History  Rt TKR, DM, OA    Patient Stated Goals  To have no pain and bend his knee better.     Currently in Pain?  Yes    Pain Score  2     Pain Location  Knee    Pain Orientation  Left    Pain Descriptors / Indicators  Aching;Sore         [facility] PT Assessment - 05/11/18  0001      Assessment   Medical Diagnosis  LT partial knee replacement     Referring Provider  Marchia Bond    Onset Date/Surgical Date  03/27/18    Next MD Visit  06/11/2018    Prior Therapy  [facility]                   [facility] Adult PT Treatment/Exercise - 05/11/18 0001      Knee/Hip Exercises: Stretches   Active Hamstring Stretch  Left;3 reps;30 seconds;Limitations    Active Hamstring Stretch Limitations  standing 12in step    Knee: Self-Stretch to increase Flexion  Left    Knee: Self-Stretch Limitations  10x15" holds on 12" step    Gastroc Stretch  3 reps;30 seconds;Limitations    Gastroc Stretch Limitations  slant board      Knee/Hip Exercises: Aerobic   Stationary Bike  28mn seat 19 full revolution; not included in charges.       Knee/Hip Exercises: Standing    Terminal Knee Extension  Left;15 reps    Theraband Level (Terminal Knee Extension)  Level 4 (Blue)    Lateral Step Up  15 reps;Hand Hold: 1;Step Height: 4"    Forward Step Up  15 reps;Hand Hold: 1;Step Height: 6"    Step Down  Left;10 reps;Hand Hold: 1;Step Height: 4"    Functional Squat  15 reps      Knee/Hip Exercises: Supine   Short Arc Quad Sets  Left;20 reps    Short Arc Quad Sets Limitations  3#, 5" holds    Bridges  15 reps    Knee Extension  AROM;Limitations    Knee Extension Limitations  5    Knee Flexion  AROM;Limitations    Knee Flexion Limitations  117               PT Short Term Goals - 05/09/18 1740      PT SHORT TERM GOAL #1   Title  Pt Lt knee extension to be below 4 to allow a normal heel toe gait pattern    Baseline  5/15: AROM 6-117 degrees    Status  Partially Met      PT SHORT TERM GOAL #2   Title  PT lt knee flexion to be to 110 to allow pt to sit for 30-40 minutes in comfort to enjoy a meal     Baseline  5/15: AROM 6-117 degrees, able to sit for 20-30 minutes    Status  Partially Met      PT SHORT TERM GOAL #3   Title  Pt strength in LE to be improved to allow pt to come from sit to stand without the use of UE support     Baseline  05/09/18: improving, continues to have gluteal weakness.  Able to complete 5STS no HHA in 22.48"    Status  Achieved      PT SHORT TERM GOAL #4   Title  PT pain to be no grater than a 3/10 to allow pt to be comfortable walking for 20 minutes to complete short shopping trips     Baseline  05/09/18: average pain scale range 3-4/10, able to walk for 10-15 minutes    Status  On-going        PT Long Term Goals - 05/09/18 1747      PT LONG TERM GOAL #1   Title  PT to be able to single leg stance on both LE for at least 15  seconds to allow pt to feel confident walking without an assistive device.     Baseline  05/09/18: Best SLS 3 reps Lt: 17", 21", 10" Rt: 26", 12", 7"    Status  Achieved      PT LONG TERM GOAL #2    Title  PT strength in LT LE to be increased to allow pt to ascend and descend 10 steps in a reciprocal manner.     Baseline  05/09/18: pt continues to demonstrate difficulty with reciprocal manner,  Able to complete reciprocal manner with 2HHA and 4in step height; step to pattern ascending 7in step, unable to complete descending    Status  On-going      PT LONG TERM GOAL #3   Title  Pt pain level to be no greater than a 1/10 in his lt knee to allow him to walk for over an hour to go out to functions/shopping.     Baseline  05/09/18:  Average pain scale range 3-4/10, able to walk for 10-15 minutes    Status  On-going      PT LONG TERM GOAL #4   Title  PT Lt knee ROM to improve to 120  allow pt to be able to squat to pick items off the floor with ease     Baseline  05/09/18: AROM 6-117 degrees    Status  On-going            Plan - 05/11/18 1507    Clinical Impression Statement  Began session focus wiht knee mobilty, progress gluteal and functional strengthening this session.  Pt given standing TKE for HEP to improve extension.  Added bridges for gluteal strengthening and began step down for eccentric quad control.  Able to increased height wtih step ups this session with some difficulty due to weakness, good mechanics demonstrated and no reports of increased pain through session.  AROM 5-117 degrees.      Rehab Potential  Good    PT Frequency  3x / week    PT Duration  6 weeks    PT Treatment/Interventions  ADLs/Self Care Home Management;Cryotherapy;Therapeutic activities;Therapeutic exercise;Balance training;Gait training;Stair training;Functional mobility training;Patient/family education;Manual techniques    PT Next Visit Plan  Continue knee mobility for extension>flexion.  Progress functional strengthening as able.  Manual for edema control ROM and pain PRN.      PT Home Exercise Plan  eval:  quad set, active hamstring stretch and heelslide; 5/13: prone quad stretch, prone knee hang, SAQ        Patient will benefit from skilled therapeutic intervention in order to improve the following deficits and impairments:  Abnormal gait, Decreased activity tolerance, Decreased balance, Decreased mobility, Decreased range of motion, Difficulty walking, Increased edema, Pain, Impaired perceived functional ability  Visit Diagnosis: Stiffness of left knee, not elsewhere classified  Localized edema  Difficulty in walking, not elsewhere classified  Muscle weakness (generalized)     Problem List Patient Active Problem List   Diagnosis Date Noted  . Primary localized osteoarthritis of left knee 03/27/2018  . S/P total knee arthroplasty 08/30/2016  . Weakness of left leg 03/13/2014  . Difficulty in walking(719.7) 03/13/2014  . Arthritis of knee, left 01/21/2014  . Cervicalgia 11/08/2011  . RHEUMATOID ARTHRITIS 02/01/2011  . RUPTURE ROTATOR CUFF 02/01/2011  . IMPINGEMENT SYNDROME 12/07/2010  . Primary localized osteoarthritis of right knee 09/28/2009  . Essential hypertension 05/13/2009   Nicholas Delacruz, [place]; [place]  Aldona Lento 05/11/2018, 3:12 PM  [place]  [facility] [place], [place], [address] Phone: [phone]   Fax:  [phone]  Name: Nicholas Delacruz MRN: [id number] Date of Birth: 04-27-1966

## 2018-05-14 ENCOUNTER — Ambulatory Visit (HOSPITAL_COMMUNITY): Payer: 59 | Admitting: Physical Therapy

## 2018-05-14 DIAGNOSIS — M25662 Stiffness of left knee, not elsewhere classified: Secondary | ICD-10-CM | POA: Diagnosis not present

## 2018-05-14 DIAGNOSIS — M1712 Unilateral primary osteoarthritis, left knee: Secondary | ICD-10-CM | POA: Diagnosis not present

## 2018-05-14 DIAGNOSIS — M545 Low back pain: Secondary | ICD-10-CM | POA: Diagnosis not present

## 2018-05-14 DIAGNOSIS — R262 Difficulty in walking, not elsewhere classified: Secondary | ICD-10-CM

## 2018-05-14 DIAGNOSIS — R600 Localized edema: Secondary | ICD-10-CM

## 2018-05-14 NOTE — Therapy (Addendum)
[place], [place], [address] Phone: [phone]   Fax:  [phone]  Physical Therapy Treatment  Patient Details  Name: Nicholas Delacruz MRN: [id number] Date of Birth: Jul 28, 1966 Referring Provider: Marchia Bond   Encounter Date: 05/14/2018  PT End of Session - 05/14/18 1337    Visit Number  11    Number of Visits  18    Date for PT Re-Evaluation  05/30/18    Authorization Type  [employer]    Authorization Time Period  4/24-->05/30/2018    Authorization - Visit Number  11    Authorization - Number of Visits  34    PT Start Time  1034    PT Stop Time  1113    PT Time Calculation (min)  39 min       Past Medical History:  Diagnosis Date  . Arthritis   . Diabetes mellitus without complication ([place])   . GERD (gastroesophageal reflux disease)   . High blood pressure   . Obstructive sleep apnea on CPAP    BIPAP  . PONV (postoperative nausea and vomiting)    blood pressure dropped in recovery  . Primary localized osteoarthritis of left knee 03/27/2018    Past Surgical History:  Procedure Laterality Date  . APPENDECTOMY    . KNEE ARTHROPLASTY    . KNEE ARTHROSCOPY Right 01/02/2015   Procedure: RIGHT KNEE ARTHROSCOPY, LATERAL AND MEDIAL MENISECTOMY;  Surgeon: Carole Civil, MD;  Location: [facility];  Service: Orthopedics;  Laterality: Right;  . KNEE ARTHROSCOPY WITH MEDIAL MENISECTOMY Left 06/25/2014   Procedure: KNEE ARTHROSCOPY WITH MEDIAL MENISECTOMY;  Surgeon: Carole Civil, MD;  Location: [facility];  Service: Orthopedics;  Laterality: Left;  . KNEE SURGERY Left   . MENISCUS DEBRIDEMENT Right 01/02/2015   Procedure: DEBRIDEMENT OF RIGHT KNEE JOINT;  Surgeon: Carole Civil, MD;  Location: [facility];  Service: Orthopedics;  Laterality: Right;  . PARTIAL KNEE ARTHROPLASTY Left 03/27/2018   Procedure: UNICOMPARTMENTAL LEFT KNEE;  Surgeon: Marchia Bond, MD;  Location: [facility];  Service: Orthopedics;   Laterality: Left;  . right knee sark    . SHOULDER SURGERY     left  . TOTAL KNEE ARTHROPLASTY Right 08/30/2016   Procedure: TOTAL KNEE ARTHROPLASTY;  Surgeon: Marchia Bond, MD;  Location: [facility];  Service: Orthopedics;  Laterality: Right;  . TOTAL SHOULDER ARTHROPLASTY      There were no vitals filed for this visit.  Subjective Assessment - 05/14/18 1038    Subjective  Pt states he was walking on uneven ground and hyperextended his knee and it's been hurting ever since.  It's 7/10 today.  STates he's considering calling is MD to make sure everything is okay    Currently in Pain?  Yes    Pain Score  7     Pain Location  Knee    Pain Orientation  Left    Pain Descriptors / Indicators  Aching;Sore;Shooting    Pain Type  Acute pain                       [facility] Adult PT Treatment/Exercise - 05/14/18 0001      Knee/Hip Exercises: Stretches   Active Hamstring Stretch  Left;3 reps;30 seconds;Limitations    Active Hamstring Stretch Limitations  standing 12in step    Knee: Self-Stretch to increase Flexion  Left    Knee: Self-Stretch Limitations  10x15" holds on 12" step  Gastroc Stretch  3 reps;30 seconds;Limitations    Gastroc Stretch Limitations  slant board      Knee/Hip Exercises: Aerobic   Stationary Bike  83mn seat 19 full revolution; not included in charges.       Knee/Hip Exercises: Supine   Quad Sets  Left;5 sets;Limitations    Quad Sets Limitations  10second holds    Heel Slides  Left;5 sets    Knee Extension  AROM;Limitations    Knee Extension Limitations  8    Knee Flexion  AROM;Limitations    Knee Flexion Limitations  117        Manual Therapy    Manual Therapy  Joint mobilization, soft tissue massage and scar massage   Manual therapy comments  performed separately from all other skilled treatments   Soft tissue to anterior and posterior knee to decrease pain and adhesions   Joint Mobilization  patella mobs all directions; PA tibofemoral joint  mobs for knee extension                 PT Short Term Goals - 05/09/18 1740      PT SHORT TERM GOAL #1   Title  Pt Lt knee extension to be below 4 to allow a normal heel toe gait pattern    Baseline  5/15: AROM 6-117 degrees    Status  Partially Met      PT SHORT TERM GOAL #2   Title  PT lt knee flexion to be to 110 to allow pt to sit for 30-40 minutes in comfort to enjoy a meal     Baseline  5/15: AROM 6-117 degrees, able to sit for 20-30 minutes    Status  Partially Met      PT SHORT TERM GOAL #3   Title  Pt strength in LE to be improved to allow pt to come from sit to stand without the use of UE support     Baseline  05/09/18: improving, continues to have gluteal weakness.  Able to complete 5STS no HHA in 22.48"    Status  Achieved      PT SHORT TERM GOAL #4   Title  PT pain to be no grater than a 3/10 to allow pt to be comfortable walking for 20 minutes to complete short shopping trips     Baseline  05/09/18: average pain scale range 3-4/10, able to walk for 10-15 minutes    Status  On-going        PT Long Term Goals - 05/09/18 1747      PT LONG TERM GOAL #1   Title  PT to be able to single leg stance on both LE for at least 15 seconds to allow pt to feel confident walking without an assistive device.     Baseline  05/09/18: Best SLS 3 reps Lt: 17", 21", 10" Rt: 26", 12", 7"    Status  Achieved      PT LONG TERM GOAL #2   Title  PT strength in LT LE to be increased to allow pt to ascend and descend 10 steps in a reciprocal manner.     Baseline  05/09/18: pt continues to demonstrate difficulty with reciprocal manner,  Able to complete reciprocal manner with 2HHA and 4in step height; step to pattern ascending 7in step, unable to complete descending    Status  On-going      PT LONG TERM GOAL #3   Title  Pt pain level to be no greater  than a 1/10 in his lt knee to allow him to walk for over an hour to go out to functions/shopping.     Baseline  05/09/18:  Average  pain scale range 3-4/10, able to walk for 10-15 minutes    Status  On-going      PT LONG TERM GOAL #4   Title  PT Lt knee ROM to improve to 120  allow pt to be able to squat to pick items off the floor with ease     Baseline  05/09/18: AROM 6-117 degrees    Status  On-going            Plan - 05/14/18 1338    Clinical Impression Statement  Pt with increased pain this session from incident where his knee hyperextended over the weekend.  No redness, edema or other issues noted with Lt knee today.  Pt with more complaints of posterior knee pain with therapist adding manual to posterior knee to help reduced discomfort.  Manual also completed to anterior knee to reduced scar tissue.  Very minimal scar tissue present with no adhesions or heat noted in knee.  ROM today 8-117 in supine.  Suggested pateint contact MD if pain does not begin to decrease by tomorrow.  Assured patient it most likely is a aggrevated hamstring tendon and should ease up.  Suggested to ice for pain.  Pt verbalized understanding.     Rehab Potential  Good    PT Frequency  3x / week    PT Duration  6 weeks    PT Treatment/Interventions  ADLs/Self Care Home Management;Cryotherapy;Therapeutic activities;Therapeutic exercise;Balance training;Gait training;Stair training;Functional mobility training;Patient/family education;Manual techniques    PT Next Visit Plan  Continue knee mobility for extension>flexion.   Manual for edema control ROM and pain PRN. Resume standing therex next session if pain is decreased.       PT Home Exercise Plan  eval:  quad set, active hamstring stretch and heelslide; 5/13: prone quad stretch, prone knee hang, SAQ       Patient will benefit from skilled therapeutic intervention in order to improve the following deficits and impairments:  Abnormal gait, Decreased activity tolerance, Decreased balance, Decreased mobility, Decreased range of motion, Difficulty walking, Increased edema, Pain, Impaired  perceived functional ability  Visit Diagnosis: Stiffness of left knee, not elsewhere classified  Localized edema  Difficulty in walking, not elsewhere classified     Problem List Patient Active Problem List   Diagnosis Date Noted  . Primary localized osteoarthritis of left knee 03/27/2018  . S/P total knee arthroplasty 08/30/2016  . Weakness of left leg 03/13/2014  . Difficulty in walking(719.7) 03/13/2014  . Arthritis of knee, left 01/21/2014  . Cervicalgia 11/08/2011  . RHEUMATOID ARTHRITIS 02/01/2011  . RUPTURE ROTATOR CUFF 02/01/2011  . IMPINGEMENT SYNDROME 12/07/2010  . Primary localized osteoarthritis of right knee 09/28/2009  . Essential hypertension 05/13/2009   Teena Irani, PTA/CLT [phone]  Teena Irani 05/14/2018, 1:43 PM  [facility] [address] [place], [place], [address] Phone: [phone]   Fax:  [phone]  Name: Nicholas Delacruz MRN: [id number] Date of Birth: 23-Apr-1966

## 2018-05-16 ENCOUNTER — Encounter (HOSPITAL_COMMUNITY): Payer: Self-pay

## 2018-05-16 ENCOUNTER — Ambulatory Visit (HOSPITAL_COMMUNITY): Payer: 59

## 2018-05-16 DIAGNOSIS — M25662 Stiffness of left knee, not elsewhere classified: Secondary | ICD-10-CM

## 2018-05-16 DIAGNOSIS — R262 Difficulty in walking, not elsewhere classified: Secondary | ICD-10-CM

## 2018-05-16 DIAGNOSIS — M6281 Muscle weakness (generalized): Secondary | ICD-10-CM

## 2018-05-16 DIAGNOSIS — R600 Localized edema: Secondary | ICD-10-CM

## 2018-05-16 NOTE — Therapy (Signed)
[facility] [place], [place], [address] Phone: [phone]   Fax:  [phone]  Physical Therapy Treatment  Patient Details  Name: Nicholas Delacruz MRN: [id number] Date of Birth: March 01, 1966 Referring Provider: Marchia Bond   Encounter Date: 05/16/2018  PT End of Session - 05/16/18 1521    Visit Number  12    Number of Visits  18    Date for PT Re-Evaluation  05/30/18 mini-reassessment complete 05/09/18     Authorization Type  [employer]    Authorization Time Period  4/24-->05/30/2018    Authorization - Visit Number  12    Authorization - Number of Visits  60 after 30 needs medical review    PT Start Time  1520 4' on bike, no charge    PT Stop Time  1558    PT Time Calculation (min)  38 min    Activity Tolerance  Patient limited by pain;Patient tolerated treatment well;No increased pain    Behavior During Therapy  WFL for tasks assessed/performed       Past Medical History:  Diagnosis Date  . Arthritis   . Diabetes mellitus without complication ([place])   . GERD (gastroesophageal reflux disease)   . High blood pressure   . Obstructive sleep apnea on CPAP    BIPAP  . PONV (postoperative nausea and vomiting)    blood pressure dropped in recovery  . Primary localized osteoarthritis of left knee 03/27/2018    Past Surgical History:  Procedure Laterality Date  . APPENDECTOMY    . KNEE ARTHROPLASTY    . KNEE ARTHROSCOPY Right 01/02/2015   Procedure: RIGHT KNEE ARTHROSCOPY, LATERAL AND MEDIAL MENISECTOMY;  Surgeon: Carole Civil, MD;  Location: [facility];  Service: Orthopedics;  Laterality: Right;  . KNEE ARTHROSCOPY WITH MEDIAL MENISECTOMY Left 06/25/2014   Procedure: KNEE ARTHROSCOPY WITH MEDIAL MENISECTOMY;  Surgeon: Carole Civil, MD;  Location: [facility];  Service: Orthopedics;  Laterality: Left;  . KNEE SURGERY Left   . MENISCUS DEBRIDEMENT Right 01/02/2015   Procedure: DEBRIDEMENT OF RIGHT KNEE JOINT;  Surgeon:  Carole Civil, MD;  Location: [facility];  Service: Orthopedics;  Laterality: Right;  . PARTIAL KNEE ARTHROPLASTY Left 03/27/2018   Procedure: UNICOMPARTMENTAL LEFT KNEE;  Surgeon: Marchia Bond, MD;  Location: [facility];  Service: Orthopedics;  Laterality: Left;  . right knee sark    . SHOULDER SURGERY     left  . TOTAL KNEE ARTHROPLASTY Right 08/30/2016   Procedure: TOTAL KNEE ARTHROPLASTY;  Surgeon: Marchia Bond, MD;  Location: [facility];  Service: Orthopedics;  Laterality: Right;  . TOTAL SHOULDER ARTHROPLASTY      There were no vitals filed for this visit.  Subjective Assessment - 05/16/18 1521    Subjective  Pt stated he went to the MD following the hamstring strain when walking uneven ground and hyperextended knee, xray was taken and reports knee parts are intact.  Current pain scale 5/10.    Pertinent History  Rt TKR, DM, OA    Patient Stated Goals  To have no pain and bend his knee better.     Currently in Pain?  Yes    Pain Score  5     Pain Location  Knee    Pain Orientation  Left    Pain Descriptors / Indicators  Sharp    Pain Type  Acute pain    Pain Onset  1 to 4 weeks ago    Pain Frequency  Intermittent    Aggravating Factors   walking, bending    Pain Relieving Factors  ice, minimal use of pain meds    Effect of Pain on Daily Activities  limits                       [facility] Adult PT Treatment/Exercise - 05/16/18 0001      Knee/Hip Exercises: Stretches   Active Hamstring Stretch  Left;3 reps;30 seconds;Limitations    Active Hamstring Stretch Limitations  standing 12in step    Knee: Self-Stretch to increase Flexion  Left    Knee: Self-Stretch Limitations  10x15" holds on 12" step    Gastroc Stretch  3 reps;30 seconds;Limitations    Gastroc Stretch Limitations  slant board      Knee/Hip Exercises: Aerobic   Stationary Bike  37mn seat 19 full revolution; not included in charges.       Knee/Hip Exercises: Standing   Knee Flexion  Left;10 reps gentle  movements      Knee/Hip Exercises: Supine   Short Arc Quad Sets  15 reps    Heel Slides  Left;10 reps    Knee Extension  AROM;Limitations    Knee Extension Limitations  5    Knee Flexion  AROM;Limitations    Knee Flexion Limitations  118      Manual Therapy   Manual Therapy  Soft tissue mobilization;Other (comment)    Manual therapy comments  performed separately from all other skilled treatments     Soft tissue mobilization  Soft tissue mobilizaiton to medial hamstrings for pain control    Other Manual Therapy  Ice massage for pain control on medial hamstrings               PT Short Term Goals - 05/09/18 1740      PT SHORT TERM GOAL #1   Title  Pt Lt knee extension to be below 4 to allow a normal heel toe gait pattern    Baseline  5/15: AROM 6-117 degrees    Status  Partially Met      PT SHORT TERM GOAL #2   Title  PT lt knee flexion to be to 110 to allow pt to sit for 30-40 minutes in comfort to enjoy a meal     Baseline  5/15: AROM 6-117 degrees, able to sit for 20-30 minutes    Status  Partially Met      PT SHORT TERM GOAL #3   Title  Pt strength in LE to be improved to allow pt to come from sit to stand without the use of UE support     Baseline  05/09/18: improving, continues to have gluteal weakness.  Able to complete 5STS no HHA in 22.48"    Status  Achieved      PT SHORT TERM GOAL #4   Title  PT pain to be no grater than a 3/10 to allow pt to be comfortable walking for 20 minutes to complete short shopping trips     Baseline  05/09/18: average pain scale range 3-4/10, able to walk for 10-15 minutes    Status  On-going        PT Long Term Goals - 05/09/18 1747      PT LONG TERM GOAL #1   Title  PT to be able to single leg stance on both LE for at least 15 seconds to allow pt to feel confident walking without an assistive device.  Baseline  05/09/18: Best SLS 3 reps Lt: 17", 21", 10" Rt: 26", 12", 7"    Status  Achieved      PT LONG TERM GOAL #2    Title  PT strength in LT LE to be increased to allow pt to ascend and descend 10 steps in a reciprocal manner.     Baseline  05/09/18: pt continues to demonstrate difficulty with reciprocal manner,  Able to complete reciprocal manner with 2HHA and 4in step height; step to pattern ascending 7in step, unable to complete descending    Status  On-going      PT LONG TERM GOAL #3   Title  Pt pain level to be no greater than a 1/10 in his lt knee to allow him to walk for over an hour to go out to functions/shopping.     Baseline  05/09/18:  Average pain scale range 3-4/10, able to walk for 10-15 minutes    Status  On-going      PT LONG TERM GOAL #4   Title  PT Lt knee ROM to improve to 120  allow pt to be able to squat to pick items off the floor with ease     Baseline  05/09/18: AROM 6-117 degrees    Status  On-going            Plan - 05/16/18 1602    Clinical Impression Statement  Pt reports visiting MD following last session due to concern with knee pain following hyperextension over weekend, reports a strain on medial hamstrings.  No redness, heat or edema present proximal knee today.  Session focus with knee mobility and pain control following strain.  Minimal closed chain exercises this session for pain control.  EOS with manual soft tissue mobilization and ice massage to hamstrings for pain and edema control.  AROM 5-118 degrees this session.  No reports of increased pain through session.      Rehab Potential  Good    PT Frequency  3x / week    PT Duration  6 weeks    PT Treatment/Interventions  ADLs/Self Care Home Management;Cryotherapy;Therapeutic activities;Therapeutic exercise;Balance training;Gait training;Stair training;Functional mobility training;Patient/family education;Manual techniques    PT Next Visit Plan  Continue knee mobility for extension>flexion.   Manual for edema control ROM and pain PRN. Resume standing therex next session if pain is decreased.       PT Home Exercise  Plan  eval:  quad set, active hamstring stretch and heelslide; 5/13: prone quad stretch, prone knee hang, SAQ       Patient will benefit from skilled therapeutic intervention in order to improve the following deficits and impairments:  Abnormal gait, Decreased activity tolerance, Decreased balance, Decreased mobility, Decreased range of motion, Difficulty walking, Increased edema, Pain, Impaired perceived functional ability  Visit Diagnosis: Stiffness of left knee, not elsewhere classified  Localized edema  Difficulty in walking, not elsewhere classified  Muscle weakness (generalized)     Problem List Patient Active Problem List   Diagnosis Date Noted  . Primary localized osteoarthritis of left knee 03/27/2018  . S/P total knee arthroplasty 08/30/2016  . Weakness of left leg 03/13/2014  . Difficulty in walking(719.7) 03/13/2014  . Arthritis of knee, left 01/21/2014  . Cervicalgia 11/08/2011  . RHEUMATOID ARTHRITIS 02/01/2011  . RUPTURE ROTATOR CUFF 02/01/2011  . IMPINGEMENT SYNDROME 12/07/2010  . Primary localized osteoarthritis of right knee 09/28/2009  . Essential hypertension 05/13/2009   Nicholas Delacruz, [place]; [place]  Nicholas Delacruz 05/16/2018,  [place], [place], [address] Phone: [phone]   Fax:  [phone]  Name: Nicholas Delacruz MRN: [id number] Date of Birth: 07-Nov-1966

## 2018-05-18 ENCOUNTER — Ambulatory Visit (HOSPITAL_COMMUNITY): Payer: 59

## 2018-05-18 ENCOUNTER — Telehealth (HOSPITAL_COMMUNITY): Payer: Self-pay | Admitting: Family Medicine

## 2018-05-18 ENCOUNTER — Encounter (HOSPITAL_COMMUNITY): Payer: Self-pay

## 2018-05-18 DIAGNOSIS — M25662 Stiffness of left knee, not elsewhere classified: Secondary | ICD-10-CM

## 2018-05-18 DIAGNOSIS — R262 Difficulty in walking, not elsewhere classified: Secondary | ICD-10-CM

## 2018-05-18 DIAGNOSIS — M6281 Muscle weakness (generalized): Secondary | ICD-10-CM

## 2018-05-18 DIAGNOSIS — R600 Localized edema: Secondary | ICD-10-CM

## 2018-05-18 NOTE — Telephone Encounter (Signed)
5/24  1:42 pm I left him a message asking if he could come in at 4:45  MM

## 2018-05-18 NOTE — Therapy (Signed)
[facility] [address] [place], [place], [address] Phone: [phone]   Fax:  [phone]  Physical Therapy Treatment  Patient Details  Name: Nicholas Delacruz MRN: [id number] Date of Birth: 1966/10/06 Referring Provider: Marchia Bond   Encounter Date: 05/18/2018  PT End of Session - 05/18/18 1700    Visit Number  13    Number of Visits  18    Date for PT Re-Evaluation  05/30/18 mini-reassess complete 05/09/18    Authorization Type  [employer]    Authorization Time Period  4/24-->05/30/2018    Authorization - Visit Number  13    Authorization - Number of Visits  60 after 30 needs medical review    PT Start Time  7253 began on bike x 4', no charge    PT Stop Time  1738    PT Time Calculation (min)  43 min    Activity Tolerance  Patient tolerated treatment well;No increased pain    Behavior During Therapy  WFL for tasks assessed/performed       Past Medical History:  Diagnosis Date  . Arthritis   . Diabetes mellitus without complication ([place])   . GERD (gastroesophageal reflux disease)   . High blood pressure   . Obstructive sleep apnea on CPAP    BIPAP  . PONV (postoperative nausea and vomiting)    blood pressure dropped in recovery  . Primary localized osteoarthritis of left knee 03/27/2018    Past Surgical History:  Procedure Laterality Date  . APPENDECTOMY    . KNEE ARTHROPLASTY    . KNEE ARTHROSCOPY Right 01/02/2015   Procedure: RIGHT KNEE ARTHROSCOPY, LATERAL AND MEDIAL MENISECTOMY;  Surgeon: Carole Civil, MD;  Location: [facility];  Service: Orthopedics;  Laterality: Right;  . KNEE ARTHROSCOPY WITH MEDIAL MENISECTOMY Left 06/25/2014   Procedure: KNEE ARTHROSCOPY WITH MEDIAL MENISECTOMY;  Surgeon: Carole Civil, MD;  Location: [facility];  Service: Orthopedics;  Laterality: Left;  . KNEE SURGERY Left   . MENISCUS DEBRIDEMENT Right 01/02/2015   Procedure: DEBRIDEMENT OF RIGHT KNEE JOINT;  Surgeon: Carole Civil,  MD;  Location: [facility];  Service: Orthopedics;  Laterality: Right;  . PARTIAL KNEE ARTHROPLASTY Left 03/27/2018   Procedure: UNICOMPARTMENTAL LEFT KNEE;  Surgeon: Marchia Bond, MD;  Location: [facility];  Service: Orthopedics;  Laterality: Left;  . right knee sark    . SHOULDER SURGERY     left  . TOTAL KNEE ARTHROPLASTY Right 08/30/2016   Procedure: TOTAL KNEE ARTHROPLASTY;  Surgeon: Marchia Bond, MD;  Location: [facility];  Service: Orthopedics;  Laterality: Right;  . TOTAL SHOULDER ARTHROPLASTY      There were no vitals filed for this visit.  Subjective Assessment - 05/18/18 1658    Subjective  Pt stated knee is feeling better today, pain scale 4/10 medial hamstrings and 3/10 anterior medial knee.  Reports he has been applying ice regularly.      Currently in Pain?  Yes    Pain Score  4  hamstring 4/10, medial knee 3/10    Pain Location  Knee    Pain Orientation  Left    Pain Descriptors / Indicators  Sharp    Pain Type  Acute pain    Pain Onset  1 to 4 weeks ago    Pain Frequency  Intermittent    Aggravating Factors   walking, bending    Pain Relieving Factors  ice, minimal use of pain meds    Effect of  Pain on Daily Activities  limits                       [facility] Adult PT Treatment/Exercise - 05/18/18 0001      Knee/Hip Exercises: Stretches   Active Hamstring Stretch  Left;3 reps;30 seconds;Limitations    Active Hamstring Stretch Limitations  standing 12in step, 3 directions 1 set each    Knee: Self-Stretch to increase Flexion  Left;10 seconds    Knee: Self-Stretch Limitations  10x10" holds on 12" step    Gastroc Stretch  3 reps;30 seconds;Limitations    Gastroc Stretch Limitations  slant board      Knee/Hip Exercises: Aerobic   Stationary Bike  39mn seat 19 full revolution; not included in charges.       Knee/Hip Exercises: Machines for Strengthening   Other Machine  power tower squats 15x 25degrees      Knee/Hip Exercises: Standing   Forward Lunges  15  reps;Both;Limitations    Forward Lunges Limitations  6in step    Lateral Step Up  15 reps;Hand Hold: 1;Step Height: 4"    Forward Step Up  15 reps;Hand Hold: 1;Step Height: 6"    Step Down  Left;10 reps;Hand Hold: 1;Step Height: 4";Step Height: 6"    Functional Squat  15 reps    SLS  Lt 26", Rt 26" max of 3      Knee/Hip Exercises: Seated   Sit to Sand  10 reps;without UE support      Knee/Hip Exercises: Supine   Short Arc [employer] 15 reps    Short Arc Quad Sets Limitations  3# 3-5" holds    Heel Slides  Left;10 reps    Knee Extension  AROM;Limitations    Knee Extension Limitations  4    Knee Flexion  AROM;Limitations    Knee Flexion Limitations  118      Manual Therapy   Manual Therapy  Joint mobilization    Manual therapy comments  performed separately from all other skilled treatments     Joint Mobilization  patella mobs all directions; PA tibofemoral joint mobs for knee extension               PT Short Term Goals - 05/09/18 1740      PT SHORT TERM GOAL #1   Title  Pt Lt knee extension to be below 4 to allow a normal heel toe gait pattern    Baseline  5/15: AROM 6-117 degrees    Status  Partially Met      PT SHORT TERM GOAL #2   Title  PT lt knee flexion to be to 110 to allow pt to sit for 30-40 minutes in comfort to enjoy a meal     Baseline  5/15: AROM 6-117 degrees, able to sit for 20-30 minutes    Status  Partially Met      PT SHORT TERM GOAL #3   Title  Pt strength in LE to be improved to allow pt to come from sit to stand without the use of UE support     Baseline  05/09/18: improving, continues to have gluteal weakness.  Able to complete 5STS no HHA in 22.48"    Status  Achieved      PT SHORT TERM GOAL #4   Title  PT pain to be no grater than a 3/10 to allow pt to be comfortable walking for 20 minutes to complete short shopping trips  Baseline  05/09/18: average pain scale range 3-4/10, able to walk for 10-15 minutes    Status  On-going         PT Long Term Goals - 05/09/18 1747      PT LONG TERM GOAL #1   Title  PT to be able to single leg stance on both LE for at least 15 seconds to allow pt to feel confident walking without an assistive device.     Baseline  05/09/18: Best SLS 3 reps Lt: 17", 21", 10" Rt: 26", 12", 7"    Status  Achieved      PT LONG TERM GOAL #2   Title  PT strength in LT LE to be increased to allow pt to ascend and descend 10 steps in a reciprocal manner.     Baseline  05/09/18: pt continues to demonstrate difficulty with reciprocal manner,  Able to complete reciprocal manner with 2HHA and 4in step height; step to pattern ascending 7in step, unable to complete descending    Status  On-going      PT LONG TERM GOAL #3   Title  Pt pain level to be no greater than a 1/10 in his lt knee to allow him to walk for over an hour to go out to functions/shopping.     Baseline  05/09/18:  Average pain scale range 3-4/10, able to walk for 10-15 minutes    Status  On-going      PT LONG TERM GOAL #4   Title  PT Lt knee ROM to improve to 120  allow pt to be able to squat to pick items off the floor with ease     Baseline  05/09/18: AROM 6-117 degrees    Status  On-going            Plan - 05/18/18 1747    Clinical Impression Statement  Pt reports pain reduced Lt hamstring, able to resume standing exercises.  Continued with stretches and ROM based exercises to address knee mobility and progressed functional strengthening.  Pt with increased difficulty performing proper squats this session, used power tower to improve form with squats and strengthening.  Added forward lunges and able to increase height with step down training today, does continue to demonstrate weak eccentric control.  AROM 4-118 degress.  Minimal reports of increased pain through session.      Rehab Potential  Good    PT Frequency  3x / week    PT Duration  6 weeks    PT Treatment/Interventions  ADLs/Self Care Home Management;Cryotherapy;Therapeutic  activities;Therapeutic exercise;Balance training;Gait training;Stair training;Functional mobility training;Patient/family education;Manual techniques    PT Next Visit Plan  Add vector stance next session.  Continue knee mobility for extension>flexion and progress functional strengthening.  Manual for edema control ROM and pain PRN.      PT Home Exercise Plan  eval:  quad set, active hamstring stretch and heelslide; 5/13: prone quad stretch, prone knee hang, SAQ       Patient will benefit from skilled therapeutic intervention in order to improve the following deficits and impairments:  Abnormal gait, Decreased activity tolerance, Decreased balance, Decreased mobility, Decreased range of motion, Difficulty walking, Increased edema, Pain, Impaired perceived functional ability  Visit Diagnosis: Stiffness of left knee, not elsewhere classified  Localized edema  Difficulty in walking, not elsewhere classified  Muscle weakness (generalized)     Problem List Patient Active Problem List   Diagnosis Date Noted  . Primary localized osteoarthritis of left knee 03/27/2018  .  S/P total knee arthroplasty 08/30/2016  . Weakness of left leg 03/13/2014  . Difficulty in walking(719.7) 03/13/2014  . Arthritis of knee, left 01/21/2014  . Cervicalgia 11/08/2011  . RHEUMATOID ARTHRITIS 02/01/2011  . RUPTURE ROTATOR CUFF 02/01/2011  . IMPINGEMENT SYNDROME 12/07/2010  . Primary localized osteoarthritis of right knee 09/28/2009  . Essential hypertension 05/13/2009   Ihor Austin, [place]; [place]  Aldona Lento 05/18/2018, 5:52 PM  [facility] [address] [place], [place], [address] Phone: [phone]   Fax:  [phone]  Name: Nicholas Delacruz MRN: [id number] Date of Birth: 1966-07-05

## 2018-05-18 NOTE — Telephone Encounter (Signed)
05/18/18  pt said to cx the Wed. appt he didn't want to come back to back days

## 2018-05-22 ENCOUNTER — Ambulatory Visit (HOSPITAL_COMMUNITY): Payer: 59

## 2018-05-22 ENCOUNTER — Encounter (HOSPITAL_COMMUNITY): Payer: Self-pay

## 2018-05-22 DIAGNOSIS — R600 Localized edema: Secondary | ICD-10-CM

## 2018-05-22 DIAGNOSIS — M25662 Stiffness of left knee, not elsewhere classified: Secondary | ICD-10-CM

## 2018-05-22 DIAGNOSIS — R262 Difficulty in walking, not elsewhere classified: Secondary | ICD-10-CM

## 2018-05-22 DIAGNOSIS — M6281 Muscle weakness (generalized): Secondary | ICD-10-CM

## 2018-05-22 NOTE — Therapy (Signed)
[facility] [address] , [place], [address] Phone: [phone]   Fax:  [phone]  Physical Therapy Treatment  Patient Details  Name: Nicholas Delacruz MRN: [id number] Date of Birth: 12/19/1966 Referring Provider: Joshua Landau   Encounter Date: 05/22/2018  PT End of Session - 05/22/18 1558    Visit Number  14    Number of Visits  18    Date for PT Re-Evaluation  05/30/18 mini-reassess complete 05/09/18    Authorization Type  [employer]    Authorization Time Period  4/24-->05/30/2018    Authorization - Visit Number  14    Authorization - Number of Visits  60 after 30 needs medical review    PT Start Time  1518    PT Stop Time  1559    PT Time Calculation (min)  41 min    Activity Tolerance  Patient tolerated treatment well;No increased pain    Behavior During Therapy  WFL for tasks assessed/performed       Past Medical History:  Diagnosis Date  . Arthritis   . Diabetes mellitus without complication (HCC)   . GERD (gastroesophageal reflux disease)   . High blood pressure   . Obstructive sleep apnea on CPAP    BIPAP  . PONV (postoperative nausea and vomiting)    blood pressure dropped in recovery  . Primary localized osteoarthritis of left knee 03/27/2018    Past Surgical History:  Procedure Laterality Date  . APPENDECTOMY    . KNEE ARTHROPLASTY    . KNEE ARTHROSCOPY Right 01/02/2015   Procedure: RIGHT KNEE ARTHROSCOPY, LATERAL AND MEDIAL MENISECTOMY;  Surgeon: Stanley E Harrison, MD;  Location: [facility];  Service: Orthopedics;  Laterality: Right;  . KNEE ARTHROSCOPY WITH MEDIAL MENISECTOMY Left 06/25/2014   Procedure: KNEE ARTHROSCOPY WITH MEDIAL MENISECTOMY;  Surgeon: Stanley E Harrison, MD;  Location: [facility];  Service: Orthopedics;  Laterality: Left;  . KNEE SURGERY Left   . MENISCUS DEBRIDEMENT Right 01/02/2015   Procedure: DEBRIDEMENT OF RIGHT KNEE JOINT;  Surgeon: Stanley E Harrison, MD;  Location: [facility];   Service: Orthopedics;  Laterality: Right;  . PARTIAL KNEE ARTHROPLASTY Left 03/27/2018   Procedure: UNICOMPARTMENTAL LEFT KNEE;  Surgeon: Landau, Joshua, MD;  Location: [facility];  Service: Orthopedics;  Laterality: Left;  . right knee sark    . SHOULDER SURGERY     left  . TOTAL KNEE ARTHROPLASTY Right 08/30/2016   Procedure: TOTAL KNEE ARTHROPLASTY;  Surgeon: Joshua Landau, MD;  Location: [facility];  Service: Orthopedics;  Laterality: Right;  . TOTAL SHOULDER ARTHROPLASTY      There were no vitals filed for this visit.  Subjective Assessment - 05/22/18 1520    Subjective  Pt stated knee is okay today, does c/o sharp pain with gait on medial aspect of anterior knee, hamstrings continue to hurt but feeling better.      Patient Stated Goals  To have no pain and bend his knee better.     Currently in Pain?  Yes    Pain Score  3     Pain Location  Knee    Pain Orientation  Left    Pain Descriptors / Indicators  Sharp    Pain Type  Acute pain    Pain Onset  1 to 4 weeks ago    Pain Frequency  Intermittent    Aggravating Factors   walking, bending    Pain Relieving Factors  ice, minimal use of pain meds      Effect of Pain on Daily Activities  limits                       [facility] Adult PT Treatment/Exercise - 05/22/18 0001      Knee/Hip Exercises: Aerobic   Stationary Bike  25mn seat 19 full revolution; not included in charges.       Knee/Hip Exercises: Standing   Forward Lunges  15 reps;Both;Limitations    Forward Lunges Limitations  6in step    Lateral Step Up  15 reps;Hand Hold: 1;Step Height: 4"    Forward Step Up  15 reps;Hand Hold: 1;Step Height: 6"    Step Down  Left;15 reps;Hand Hold: 1;Step Height: 4"    Functional Squat  15 reps cueing for mechanics to reduce anterior stress    Wall Squat  10 reps;5 seconds    SLS with Vectors  5x 5" 1 finger assistance      Knee/Hip Exercises: Supine   Short Arc Quad Sets  15 reps    Knee Extension Limitations  4    Knee Flexion  Limitations  123      Manual Therapy   Manual Therapy  Joint mobilization    Manual therapy comments  performed separately from all other skilled treatments     Joint Mobilization  patella mobs all directions; PA tibofemoral joint mobs for knee extension               PT Short Term Goals - 05/09/18 1740      PT SHORT TERM GOAL #1   Title  Pt Lt knee extension to be below 4 to allow a normal heel toe gait pattern    Baseline  5/15: AROM 6-117 degrees    Status  Partially Met      PT SHORT TERM GOAL #2   Title  PT lt knee flexion to be to 110 to allow pt to sit for 30-40 minutes in comfort to enjoy a meal     Baseline  5/15: AROM 6-117 degrees, able to sit for 20-30 minutes    Status  Partially Met      PT SHORT TERM GOAL #3   Title  Pt strength in LE to be improved to allow pt to come from sit to stand without the use of UE support     Baseline  05/09/18: improving, continues to have gluteal weakness.  Able to complete 5STS no HHA in 22.48"    Status  Achieved      PT SHORT TERM GOAL #4   Title  PT pain to be no grater than a 3/10 to allow pt to be comfortable walking for 20 minutes to complete short shopping trips     Baseline  05/09/18: average pain scale range 3-4/10, able to walk for 10-15 minutes    Status  On-going        PT Long Term Goals - 05/09/18 1747      PT LONG TERM GOAL #1   Title  PT to be able to single leg stance on both LE for at least 15 seconds to allow pt to feel confident walking without an assistive device.     Baseline  05/09/18: Best SLS 3 reps Lt: 17", 21", 10" Rt: 26", 12", 7"    Status  Achieved      PT LONG TERM GOAL #2   Title  PT strength in LT LE to be increased to allow pt to ascend and descend 10  steps in a reciprocal manner.     Baseline  05/09/18: pt continues to demonstrate difficulty with reciprocal manner,  Able to complete reciprocal manner with 2HHA and 4in step height; step to pattern ascending 7in step, unable to complete  descending    Status  On-going      PT LONG TERM GOAL #3   Title  Pt pain level to be no greater than a 1/10 in his lt knee to allow him to walk for over an hour to go out to functions/shopping.     Baseline  05/09/18:  Average pain scale range 3-4/10, able to walk for 10-15 minutes    Status  On-going      PT LONG TERM GOAL #4   Title  PT Lt knee ROM to improve to 120  allow pt to be able to squat to pick items off the floor with ease     Baseline  05/09/18: AROM 6-117 degrees    Status  On-going            Plan - 05/22/18 1553    Clinical Impression Statement  Continued session focus with knee mobility and functional strengthening.  Pt continues to be limited by pain with functional activities stating sharp pain medial knee with step and squat training.  Good patella mobility noted with no pain during manual movements.  Progressed to wall squats for strengthening to assess pain tolerance, pt reports difficulty due to weakness.  No reports of increased pain at EOS.  AROM 4-123 degrees.    Rehab Potential  Good    PT Frequency  3x / week    PT Duration  6 weeks    PT Treatment/Interventions  ADLs/Self Care Home Management;Cryotherapy;Therapeutic activities;Therapeutic exercise;Balance training;Gait training;Stair training;Functional mobility training;Patient/family education;Manual techniques    PT Next Visit Plan  Continue knee mobility for extension>flexion and progress functional strengthening.  Manual for edema control ROM and pain PRN.      PT Home Exercise Plan  eval:  quad set, active hamstring stretch and heelslide; 5/13: prone quad stretch, prone knee hang, SAQ       Patient will benefit from skilled therapeutic intervention in order to improve the following deficits and impairments:  Abnormal gait, Decreased activity tolerance, Decreased balance, Decreased mobility, Decreased range of motion, Difficulty walking, Increased edema, Pain, Impaired perceived functional  ability  Visit Diagnosis: Stiffness of left knee, not elsewhere classified  Localized edema  Difficulty in walking, not elsewhere classified  Muscle weakness (generalized)     Problem List Patient Active Problem List   Diagnosis Date Noted  . Primary localized osteoarthritis of left knee 03/27/2018  . S/P total knee arthroplasty 08/30/2016  . Weakness of left leg 03/13/2014  . Difficulty in walking(719.7) 03/13/2014  . Arthritis of knee, left 01/21/2014  . Cervicalgia 11/08/2011  . RHEUMATOID ARTHRITIS 02/01/2011  . RUPTURE ROTATOR CUFF 02/01/2011  . IMPINGEMENT SYNDROME 12/07/2010  . Primary localized osteoarthritis of right knee 09/28/2009  . Essential hypertension 05/13/2009   Casey Cockerham, LPTA; CBIS [phone]  Cockerham, Casey Jo 05/22/2018, 4:02 PM  [facility] [address] [place], [place], [address] Phone: [phone]   Fax:  [phone]  Name: Samer A Ingram MRN: [id number] Date of Birth: 04/18/1966   

## 2018-05-23 ENCOUNTER — Encounter (HOSPITAL_COMMUNITY): Payer: 59

## 2018-05-23 DIAGNOSIS — G4730 Sleep apnea, unspecified: Secondary | ICD-10-CM | POA: Diagnosis not present

## 2018-05-23 DIAGNOSIS — Z1389 Encounter for screening for other disorder: Secondary | ICD-10-CM | POA: Diagnosis not present

## 2018-05-23 DIAGNOSIS — E1165 Type 2 diabetes mellitus with hyperglycemia: Secondary | ICD-10-CM | POA: Diagnosis not present

## 2018-05-25 ENCOUNTER — Ambulatory Visit (HOSPITAL_COMMUNITY): Payer: 59

## 2018-05-25 DIAGNOSIS — M6281 Muscle weakness (generalized): Secondary | ICD-10-CM

## 2018-05-25 DIAGNOSIS — R262 Difficulty in walking, not elsewhere classified: Secondary | ICD-10-CM

## 2018-05-25 DIAGNOSIS — M25662 Stiffness of left knee, not elsewhere classified: Secondary | ICD-10-CM | POA: Diagnosis not present

## 2018-05-25 DIAGNOSIS — R600 Localized edema: Secondary | ICD-10-CM

## 2018-05-25 NOTE — Therapy (Signed)
[facility] [address] [place], [place], [address] Phone: [phone]   Fax:  [phone]  Physical Therapy Treatment  Patient Details  Name: Nicholas Delacruz MRN: [id number] Date of Birth: 1966-05-27 Referring Provider: Marchia Bond   Encounter Date: 05/25/2018  PT End of Session - 05/25/18 1517    Visit Number  15    Number of Visits  18    Date for PT Re-Evaluation  05/30/18 mini-reassess complete 05/09/18    Authorization Type  [employer]    Authorization Time Period  4/24-->05/30/2018    Authorization - Visit Number  15    Authorization - Number of Visits  60 after 30 needs medical review    PT Start Time  1194    PT Stop Time  1558    PT Time Calculation (min)  42 min    Activity Tolerance  Patient tolerated treatment well;No increased pain    Behavior During Therapy  WFL for tasks assessed/performed       Past Medical History:  Diagnosis Date  . Arthritis   . Diabetes mellitus without complication ([place])   . GERD (gastroesophageal reflux disease)   . High blood pressure   . Obstructive sleep apnea on CPAP    BIPAP  . PONV (postoperative nausea and vomiting)    blood pressure dropped in recovery  . Primary localized osteoarthritis of left knee 03/27/2018    Past Surgical History:  Procedure Laterality Date  . APPENDECTOMY    . KNEE ARTHROPLASTY    . KNEE ARTHROSCOPY Right 01/02/2015   Procedure: RIGHT KNEE ARTHROSCOPY, LATERAL AND MEDIAL MENISECTOMY;  Surgeon: Carole Civil, MD;  Location: [facility];  Service: Orthopedics;  Laterality: Right;  . KNEE ARTHROSCOPY WITH MEDIAL MENISECTOMY Left 06/25/2014   Procedure: KNEE ARTHROSCOPY WITH MEDIAL MENISECTOMY;  Surgeon: Carole Civil, MD;  Location: [facility];  Service: Orthopedics;  Laterality: Left;  . KNEE SURGERY Left   . MENISCUS DEBRIDEMENT Right 01/02/2015   Procedure: DEBRIDEMENT OF RIGHT KNEE JOINT;  Surgeon: Carole Civil, MD;  Location: [facility];   Service: Orthopedics;  Laterality: Right;  . PARTIAL KNEE ARTHROPLASTY Left 03/27/2018   Procedure: UNICOMPARTMENTAL LEFT KNEE;  Surgeon: Marchia Bond, MD;  Location: [facility];  Service: Orthopedics;  Laterality: Left;  . right knee sark    . SHOULDER SURGERY     left  . TOTAL KNEE ARTHROPLASTY Right 08/30/2016   Procedure: TOTAL KNEE ARTHROPLASTY;  Surgeon: Marchia Bond, MD;  Location: [facility];  Service: Orthopedics;  Laterality: Right;  . TOTAL SHOULDER ARTHROPLASTY      There were no vitals filed for this visit.  Subjective Assessment - 05/25/18 1519    Subjective  Pt states he ist still having knee pain and rates it about 3/10.    Patient Stated Goals  To have no pain and bend his knee better.     Currently in Pain?  Yes    Pain Score  3     Pain Location  Knee    Pain Orientation  Left    Pain Descriptors / Indicators  Sharp    Pain Type  Acute pain    Pain Onset  More than a month ago    Pain Frequency  Intermittent    Aggravating Factors   walking, bending    Pain Relieving Factors  ice, minimal use of pain meds    Effect of Pain on Daily Activities  limits  [facility] Adult PT Treatment/Exercise - 05/25/18 0001      Knee/Hip Exercises: Aerobic   Stationary Bike  27mn seat 19 full revolution; not included in charges.       Knee/Hip Exercises: Machines for Strengthening   Cybex Knee Extension  3 plates, BLE, 2x15    Cybex Knee Flexion  5.5plates, BLE, 2x15    Other Machine  power tower, 25deg, 2x15reps      Knee/Hip Exercises: Standing   Lateral Step Up  Left;15 reps;Hand Hold: 0;Step Height: 6"    Forward Step Up  Left;15 reps;Hand Hold: 0;Step Height: 6"    Other Standing Knee Exercises  sidestepping [id number] RTB    Other Standing Knee Exercises  fwd/lateral heel taps on 2" step x10 reps each      Knee/Hip Exercises: Seated   Long Arc Quad  Left;2 sets;15 reps;Weights;Limitations    Long Arc Quad Weight  5 lbs.    Long ArCSX Corporationimitations  3-5" holds     Sit to [employer]2 sets;10 reps;without UE support 2" step under RLE to improve weight shift over LLE      Knee/Hip Exercises: Supine   Knee Extension Limitations  3    Knee Flexion Limitations  120      Manual Therapy   Manual Therapy  Joint mobilization    Manual therapy comments  performed separately from all other skilled treatments     Joint Mobilization  patella mobs all directions; PA tibofemoral joint mobs for knee extension             PT Education - 05/25/18 1517    Education provided  Yes    Person(s) Educated  Patient    Methods  Explanation;Demonstration    Comprehension  Verbalized understanding;Returned demonstration       PT Short Term Goals - 05/09/18 1740      PT SHORT TERM GOAL #1   Title  Pt Lt knee extension to be below 4 to allow a normal heel toe gait pattern    Baseline  5/15: AROM 6-117 degrees    Status  Partially Met      PT SHORT TERM GOAL #2   Title  PT lt knee flexion to be to 110 to allow pt to sit for 30-40 minutes in comfort to enjoy a meal     Baseline  5/15: AROM 6-117 degrees, able to sit for 20-30 minutes    Status  Partially Met      PT SHORT TERM GOAL #3   Title  Pt strength in LE to be improved to allow pt to come from sit to stand without the use of UE support     Baseline  05/09/18: improving, continues to have gluteal weakness.  Able to complete 5STS no HHA in 22.48"    Status  Achieved      PT SHORT TERM GOAL #4   Title  PT pain to be no grater than a 3/10 to allow pt to be comfortable walking for 20 minutes to complete short shopping trips     Baseline  05/09/18: average pain scale range 3-4/10, able to walk for 10-15 minutes    Status  On-going        PT Long Term Goals - 05/09/18 1747      PT LONG TERM GOAL #1   Title  PT to be able to single leg stance on both LE for at least 15 seconds to allow pt to feel confident  walking without an assistive device.     Baseline  05/09/18: Best SLS 3 reps Lt: 17", 21", 10" Rt: 26",  12", 7"    Status  Achieved      PT LONG TERM GOAL #2   Title  PT strength in LT LE to be increased to allow pt to ascend and descend 10 steps in a reciprocal manner.     Baseline  05/09/18: pt continues to demonstrate difficulty with reciprocal manner,  Able to complete reciprocal manner with 2HHA and 4in step height; step to pattern ascending 7in step, unable to complete descending    Status  On-going      PT LONG TERM GOAL #3   Title  Pt pain level to be no greater than a 1/10 in his lt knee to allow him to walk for over an hour to go out to functions/shopping.     Baseline  05/09/18:  Average pain scale range 3-4/10, able to walk for 10-15 minutes    Status  On-going      PT LONG TERM GOAL #4   Title  PT Lt knee ROM to improve to 120  allow pt to be able to squat to pick items off the floor with ease     Baseline  05/09/18: AROM 6-117 degrees    Status  On-going            Plan - 05/25/18 1604    Clinical Impression Statement  Progressed pt's quad, hip, and functional strengthening this date since pt stating that he is still having difficulty with steps and STS due to weakness and some pain. Pt tolerated progressions well, verbalizing noticeable increase in challenge. Began cybex machines this date for quad and HS strengthening. Progressed step ups and added heel taps and side stepping. Ended with joint mobs to patella and tibiofemoral joint. AROM 3 to 120dg this date. Continue as planned, progressing as able.    Rehab Potential  Good    PT Frequency  3x / week    PT Duration  6 weeks    PT Treatment/Interventions  ADLs/Self Care Home Management;Cryotherapy;Therapeutic activities;Therapeutic exercise;Balance training;Gait training;Stair training;Functional mobility training;Patient/family education;Manual techniques    PT Next Visit Plan  continue cybex machines and progressed strenghtening; Continue knee mobility for extension>flexion and progress functional strengthening.  Manual  for edema control ROM and pain PRN.      PT Home Exercise Plan  eval:  quad set, active hamstring stretch and heelslide; 5/13: prone quad stretch, prone knee hang, SAQ    Consulted and Agree with Plan of Care  Patient       Patient will benefit from skilled therapeutic intervention in order to improve the following deficits and impairments:  Abnormal gait, Decreased activity tolerance, Decreased balance, Decreased mobility, Decreased range of motion, Difficulty walking, Increased edema, Pain, Impaired perceived functional ability  Visit Diagnosis: Stiffness of left knee, not elsewhere classified  Localized edema  Difficulty in walking, not elsewhere classified  Muscle weakness (generalized)     Problem List Patient Active Problem List   Diagnosis Date Noted  . Primary localized osteoarthritis of left knee 03/27/2018  . S/P total knee arthroplasty 08/30/2016  . Weakness of left leg 03/13/2014  . Difficulty in walking(719.7) 03/13/2014  . Arthritis of knee, left 01/21/2014  . Cervicalgia 11/08/2011  . RHEUMATOID ARTHRITIS 02/01/2011  . RUPTURE ROTATOR CUFF 02/01/2011  . IMPINGEMENT SYNDROME 12/07/2010  . Primary localized osteoarthritis of right knee 09/28/2009  . Essential hypertension 05/13/2009  [facility], [facility] [address] [place], [place], [address] Phone: [phone]   Fax:  [phone]  Name: Nicholas Delacruz MRN: [id number] Date of Birth: 1966/06/25

## 2018-05-28 ENCOUNTER — Encounter (HOSPITAL_COMMUNITY): Payer: Self-pay

## 2018-05-28 ENCOUNTER — Ambulatory Visit (HOSPITAL_COMMUNITY): Payer: 59 | Attending: Orthopedic Surgery

## 2018-05-28 DIAGNOSIS — R600 Localized edema: Secondary | ICD-10-CM | POA: Insufficient documentation

## 2018-05-28 DIAGNOSIS — M6281 Muscle weakness (generalized): Secondary | ICD-10-CM | POA: Diagnosis present

## 2018-05-28 DIAGNOSIS — R262 Difficulty in walking, not elsewhere classified: Secondary | ICD-10-CM

## 2018-05-28 DIAGNOSIS — M25662 Stiffness of left knee, not elsewhere classified: Secondary | ICD-10-CM | POA: Insufficient documentation

## 2018-05-28 NOTE — Therapy (Signed)
[facility] [place], [place], [address] Phone: [phone]   Fax:  [phone]  Physical Therapy Treatment  Patient Details  Name: Nicholas Delacruz MRN: [id number] Date of Birth: 16-Feb-1966 Referring Provider: Marchia Bond   Encounter Date: 05/28/2018  PT End of Session - 05/28/18 1520    Visit Number  16    Number of Visits  18    Date for PT Re-Evaluation  05/30/18 mini-reassess complete 05/09/18    Authorization Type  [employer] care    Authorization Time Period  4/24-->05/30/2018    Authorization - Visit Number  16    Authorization - Number of Visits  60 after 30 needs medical review    PT Start Time  7510    PT Stop Time  1601    PT Time Calculation (min)  44 min    Activity Tolerance  Patient tolerated treatment well;No increased pain    Behavior During Therapy  WFL for tasks assessed/performed       Past Medical History:  Diagnosis Date  . Arthritis   . Diabetes mellitus without complication ([place])   . GERD (gastroesophageal reflux disease)   . High blood pressure   . Obstructive sleep apnea on CPAP    BIPAP  . PONV (postoperative nausea and vomiting)    blood pressure dropped in recovery  . Primary localized osteoarthritis of left knee 03/27/2018    Past Surgical History:  Procedure Laterality Date  . APPENDECTOMY    . KNEE ARTHROPLASTY    . KNEE ARTHROSCOPY Right 01/02/2015   Procedure: RIGHT KNEE ARTHROSCOPY, LATERAL AND MEDIAL MENISECTOMY;  Surgeon: Carole Civil, MD;  Location: [facility];  Service: Orthopedics;  Laterality: Right;  . KNEE ARTHROSCOPY WITH MEDIAL MENISECTOMY Left 06/25/2014   Procedure: KNEE ARTHROSCOPY WITH MEDIAL MENISECTOMY;  Surgeon: Carole Civil, MD;  Location: [facility];  Service: Orthopedics;  Laterality: Left;  . KNEE SURGERY Left   . MENISCUS DEBRIDEMENT Right 01/02/2015   Procedure: DEBRIDEMENT OF RIGHT KNEE JOINT;  Surgeon: Carole Civil, MD;  Location: [facility];  Service:  Orthopedics;  Laterality: Right;  . PARTIAL KNEE ARTHROPLASTY Left 03/27/2018   Procedure: UNICOMPARTMENTAL LEFT KNEE;  Surgeon: Marchia Bond, MD;  Location: [facility];  Service: Orthopedics;  Laterality: Left;  . right knee sark    . SHOULDER SURGERY     left  . TOTAL KNEE ARTHROPLASTY Right 08/30/2016   Procedure: TOTAL KNEE ARTHROPLASTY;  Surgeon: Marchia Bond, MD;  Location: [facility];  Service: Orthopedics;  Laterality: Right;  . TOTAL SHOULDER ARTHROPLASTY      There were no vitals filed for this visit.  Subjective Assessment - 05/28/18 1520    Subjective  Pt reports that he felt really good after his last session with the increased strengthening but states that his L HS has been really bothering him since Saturday after he tried to bush-hog. He is not sure if it was him having to work the [employer] or what.     Patient Stated Goals  To have no pain and bend his knee better.     Currently in Pain?  Yes    Pain Score  3     Pain Location  Knee    Pain Orientation  Right;Posterior    Pain Descriptors / Indicators  Sharp    Pain Type  Acute pain    Pain Onset  More than a month ago    Pain Frequency  Intermittent  Aggravating Factors   walking, bending    Pain Relieving Factors  ice, minimal use of pain meds    Effect of Pain on Daily Activities  limits             [facility] Adult PT Treatment/Exercise - 05/28/18 0001      Knee/Hip Exercises: Stretches   Active Hamstring Stretch  Left;3 reps;30 seconds;Limitations    Active Hamstring Stretch Limitations  standing, 12" step    Gastroc Stretch  3 reps;30 seconds;Limitations    Gastroc Stretch Limitations  slant board      Knee/Hip Exercises: Aerobic   Stationary Bike  50mn seat 18 full revolution; not included in charges.       Knee/Hip Exercises: Machines for Strengthening   Cybex Knee Extension  3 plates, BLE, 2x15    Cybex Knee Flexion  5.5plates, BLE, 2x15      Knee/Hip Exercises: Standing   Knee Flexion  Left;15 reps     Knee Flexion Limitations  2#    Lateral Step Up  Left;15 reps;Hand Hold: 0;Step Height: 6"    Forward Step Up  Left;15 reps;Hand Hold: 0;Step Height: 6"    Stairs  x4RT on 7" and 4" side, step-to and reciprocally with 1UE assist    Walking with Sports Cord  retro amb against purple band [id number]    Other Standing Knee Exercises  fwd/lateral heel taps on 2" step x15 reps each      Knee/Hip Exercises: Supine   Bridges  Both;15 reps    Knee Extension Limitations  2    Knee Flexion Limitations  117      Manual Therapy   Manual Therapy  Soft tissue mobilization    Manual therapy comments  performed separately from all other skilled treatments     Soft tissue mobilization  STM to L HS for pain control            PT Education - 05/28/18 1520    Education provided  Yes    Education Details  exercise technique, will reassess next visit    Person(s) Educated  Patient    Methods  Explanation;Demonstration    Comprehension  Verbalized understanding;Returned demonstration       PT Short Term Goals - 05/09/18 1740      PT SHORT TERM GOAL #1   Title  Pt Lt knee extension to be below 4 to allow a normal heel toe gait pattern    Baseline  5/15: AROM 6-117 degrees    Status  Partially Met      PT SHORT TERM GOAL #2   Title  PT lt knee flexion to be to 110 to allow pt to sit for 30-40 minutes in comfort to enjoy a meal     Baseline  5/15: AROM 6-117 degrees, able to sit for 20-30 minutes    Status  Partially Met      PT SHORT TERM GOAL #3   Title  Pt strength in LE to be improved to allow pt to come from sit to stand without the use of UE support     Baseline  05/09/18: improving, continues to have gluteal weakness.  Able to complete 5STS no HHA in 22.48"    Status  Achieved      PT SHORT TERM GOAL #4   Title  PT pain to be no grater than a 3/10 to allow pt to be comfortable walking for 20 minutes to complete short shopping trips  Baseline  05/09/18: average pain scale range  3-4/10, able to walk for 10-15 minutes    Status  On-going        PT Long Term Goals - 05/09/18 1747      PT LONG TERM GOAL #1   Title  PT to be able to single leg stance on both LE for at least 15 seconds to allow pt to feel confident walking without an assistive device.     Baseline  05/09/18: Best SLS 3 reps Lt: 17", 21", 10" Rt: 26", 12", 7"    Status  Achieved      PT LONG TERM GOAL #2   Title  PT strength in LT LE to be increased to allow pt to ascend and descend 10 steps in a reciprocal manner.     Baseline  05/09/18: pt continues to demonstrate difficulty with reciprocal manner,  Able to complete reciprocal manner with 2HHA and 4in step height; step to pattern ascending 7in step, unable to complete descending    Status  On-going      PT LONG TERM GOAL #3   Title  Pt pain level to be no greater than a 1/10 in his lt knee to allow him to walk for over an hour to go out to functions/shopping.     Baseline  05/09/18:  Average pain scale range 3-4/10, able to walk for 10-15 minutes    Status  On-going      PT LONG TERM GOAL #4   Title  PT Lt knee ROM to improve to 120  allow pt to be able to squat to pick items off the floor with ease     Baseline  05/09/18: AROM 6-117 degrees    Status  On-going            Plan - 05/28/18 1602    Clinical Impression Statement  Pt presents to therapy stating his L HS has been bothering him since Saturday after he bush-hogged. Today's session performed within pain tolerance. Pt able to tolerate all therex this date without increases in L HS pain. His main concern/complaint at this point is stair ambulation and states he still has to perform descent step-to with UE support. PT had pt perform multiple rounds on the stairs and pt stating his L knee feels weak which is what is causing his limitation. PT educated pt to begin practicing stair ambulation reciprocally at home and continue to strengthen his quad to assist with this. Ended with manual for L  HS to decrease pain. AROM 2-117deg this date. Pt due for reassessment next visit.    Rehab Potential  Good    PT Frequency  3x / week    PT Duration  6 weeks    PT Treatment/Interventions  ADLs/Self Care Home Management;Cryotherapy;Therapeutic activities;Therapeutic exercise;Balance training;Gait training;Stair training;Functional mobility training;Patient/family education;Manual techniques    PT Next Visit Plan  reassessment; continue cybex machines and progressed strenghtening; Continue knee mobility for extension>flexion and progress functional strengthening.  Manual for edema control ROM and pain PRN.      PT Home Exercise Plan  eval:  quad set, active hamstring stretch and heelslide; 5/13: prone quad stretch, prone knee hang, SAQ; 6/3: stair ambulation    Consulted and Agree with Plan of Care  Patient       Patient will benefit from skilled therapeutic intervention in order to improve the following deficits and impairments:  Abnormal gait, Decreased activity tolerance, Decreased balance, Decreased mobility, Decreased range of motion, Difficulty  walking, Increased edema, Pain, Impaired perceived functional ability  Visit Diagnosis: Stiffness of left knee, not elsewhere classified  Localized edema  Difficulty in walking, not elsewhere classified  Muscle weakness (generalized)     Problem List Patient Active Problem List   Diagnosis Date Noted  . Primary localized osteoarthritis of left knee 03/27/2018  . S/P total knee arthroplasty 08/30/2016  . Weakness of left leg 03/13/2014  . Difficulty in walking(719.7) 03/13/2014  . Arthritis of knee, left 01/21/2014  . Cervicalgia 11/08/2011  . RHEUMATOID ARTHRITIS 02/01/2011  . RUPTURE ROTATOR CUFF 02/01/2011  . IMPINGEMENT SYNDROME 12/07/2010  . Primary localized osteoarthritis of right knee 09/28/2009  . Essential hypertension 05/13/2009       Geraldine Solar PT, DPT  [facility] [address] [place], [place], [address] Phone: [phone]   Fax:  [phone]  Name: Nicholas Delacruz MRN: [id number] Date of Birth: 1966/02/26

## 2018-05-30 ENCOUNTER — Ambulatory Visit (HOSPITAL_COMMUNITY): Payer: 59

## 2018-05-30 DIAGNOSIS — M25662 Stiffness of left knee, not elsewhere classified: Secondary | ICD-10-CM

## 2018-05-30 DIAGNOSIS — R262 Difficulty in walking, not elsewhere classified: Secondary | ICD-10-CM

## 2018-05-30 DIAGNOSIS — R600 Localized edema: Secondary | ICD-10-CM

## 2018-05-30 DIAGNOSIS — M6281 Muscle weakness (generalized): Secondary | ICD-10-CM

## 2018-05-30 NOTE — Patient Instructions (Signed)
Access Code: [email or web address]  URL: [email or web address]  Date: 05/30/2018  Prepared by: Geraldine Solar   Exercises Seated Long Arc Quad with Ankle Weight - 10 reps - 3 sets - 1x daily - 7x weekly Sitting Knee Extension with Resistance - 10 reps - 3 sets - 1x daily - 7x weekly Seated Hamstring Curl with Anchored Resistance - 10 reps - 3 sets - 1x daily - 7x weekly Side Stepping with Resistance at Feet - 10 reps - 3 sets - 1x daily - 7x weekly Standing Hip Abduction with Theraband Resistance - 10 reps - 3 sets - 1x daily - 7x weekly Lateral Step Up - 10 reps - 3 sets - 1x daily - 7x weekly Forward Step Up - 10 reps - 3 sets - 1x daily - 7x weekly

## 2018-05-30 NOTE — Therapy (Signed)
[facility] [address] [place], [place], [address] Phone: [phone]   Fax:  [phone]  Physical Therapy Treatment  Patient Details  Name: Nicholas Delacruz MRN: [id number] Date of Birth: 02-Sep-1966 Referring Provider: Marchia Bond   Encounter Date: 05/30/2018  PT End of Session - 05/30/18 1517    Visit Number  17    Number of Visits  18    Date for PT Re-Evaluation  05/30/18 mini-reassess complete 05/09/18    Authorization Type  [employer] care    Authorization Time Period  4/24-->05/30/2018    Authorization - Visit Number  13    Authorization - Number of Visits  60 after 30 needs medical review    PT Start Time  0370    PT Stop Time  1540    PT Time Calculation (min)  25 min    Activity Tolerance  Patient tolerated treatment well;No increased pain    Behavior During Therapy  WFL for tasks assessed/performed       Past Medical History:  Diagnosis Date  . Arthritis   . Diabetes mellitus without complication ([place])   . GERD (gastroesophageal reflux disease)   . High blood pressure   . Obstructive sleep apnea on CPAP    BIPAP  . PONV (postoperative nausea and vomiting)    blood pressure dropped in recovery  . Primary localized osteoarthritis of left knee 03/27/2018    Past Surgical History:  Procedure Laterality Date  . APPENDECTOMY    . KNEE ARTHROPLASTY    . KNEE ARTHROSCOPY Right 01/02/2015   Procedure: RIGHT KNEE ARTHROSCOPY, LATERAL AND MEDIAL MENISECTOMY;  Surgeon: Carole Civil, MD;  [facility];  Service: Orthopedics;  Laterality: Right;  . KNEE ARTHROSCOPY WITH MEDIAL MENISECTOMY Left 06/25/2014   Procedure: KNEE ARTHROSCOPY WITH MEDIAL MENISECTOMY;  Surgeon: Carole Civil, MD;  Location[facility];  Service: Orthopedics;  Laterality: Left;  . KNEE SURGERY Left   . MENISCUS DEBRIDEMENT Right 01/02/2015   Procedure: DEBRIDEMENT OF RIGHT KNEE JOINT;  Surgeon: Carole Civil, MD;  Location: [facility];  Service:  Orthopedics;  Laterality: Right;  . PARTIAL KNEE ARTHROPLASTY Left 03/27/2018   Procedure: UNICOMPARTMENTAL LEFT KNEE;  Surgeon: Marchia Bond, MD;  Location: [facility];  Service: Orthopedics;  Laterality: Left;  . right knee sark    . SHOULDER SURGERY     left  . TOTAL KNEE ARTHROPLASTY Right 08/30/2016   Procedure: TOTAL KNEE ARTHROPLASTY;  Surgeon: Marchia Bond, MD;  Location: [facility];  Service: Orthopedics;  Laterality: Right;  . TOTAL SHOULDER ARTHROPLASTY      There were no vitals filed for this visit.  Subjective Assessment - 05/30/18 1518    Subjective  Pt states that he felt pretty good following Monday's session but states that it kept him awake all night that night. He states his L knee pain is about 2.5/10 at the moment at his anterior knee.     Patient Stated Goals  To have no pain and bend his knee better.     Currently in Pain?  Yes    Pain Score  3     Pain Location  Knee    Pain Orientation  Left;Lateral    Pain Descriptors / Indicators  Sharp    Pain Type  Acute pain    Pain Onset  More than a month ago    Pain Frequency  Intermittent    Aggravating Factors   walking, bending  Pain Relieving Factors  ice, minimal use of pain meds    Effect of Pain on Daily Activities  limits         [facility] PT Assessment - 05/30/18 0001      Assessment   Medical Diagnosis  LT partial knee replacement     Referring Provider  Marchia Bond    Onset Date/Surgical Date  03/27/18    Next MD Visit  06/11/2018    Prior Therapy  [facility]      Functional Tests   Functional tests  Single leg stance      AROM   Left Knee Extension  3 was 5    Left Knee Flexion  120 was 117             PT Short Term Goals - 05/30/18 1519      PT SHORT TERM GOAL #1   Title  Pt Lt knee extension to be below 4 to allow a normal heel toe gait pattern    Baseline  6/5: 3-120deg    Status  Achieved      PT SHORT TERM GOAL #2   Title  PT lt knee flexion to be to 110 to allow pt to sit for 30-40 minutes  in comfort to enjoy a meal     Baseline  6/5: 3-120deg, can sit unlimited amounts of time    Status  Achieved      PT SHORT TERM GOAL #3   Title  Pt strength in LE to be improved to allow pt to come from sit to stand without the use of UE support     Baseline  05/09/18: improving, continues to have gluteal weakness.  Able to complete 5STS no HHA in 22.48"    Status  Achieved      PT SHORT TERM GOAL #4   Title  PT pain to be no grater than a 3/10 to allow pt to be comfortable walking for 20 minutes to complete short shopping trips     Baseline  6/5: some days its below 3 and some days it's above 3 but he can walk comfortably 10-15 mins    Status  Partially Met        PT Long Term Goals - 05/30/18 1521      PT LONG TERM GOAL #1   Title  PT to be able to single leg stance on both LE for at least 15 seconds to allow pt to feel confident walking without an assistive device.     Baseline  05/09/18: Best SLS 3 reps Lt: 17", 21", 10" Rt: 26", 12", 7"    Status  Achieved      PT LONG TERM GOAL #2   Title  PT strength in LT LE to be increased to allow pt to ascend and descend 10 steps in a reciprocal manner.     Baseline  6/5: able to ascend/descend reciprocally on 7" side with 1-2 HR assist througout, reported pain but able to complete     Status  Partially Met      PT LONG TERM GOAL #3   Title  Pt pain level to be no greater than a 1/10 in his lt knee to allow him to walk for over an hour to go out to functions/shopping.     Baseline  6/5: some days its below 3 and some days it's above 3 but he can walk comfortably 10-15 mins    Status  On-going  PT LONG TERM GOAL #4   Title  PT Lt knee ROM to improve to 120  allow pt to be able to squat to pick items off the floor with ease     Baseline  6/5: 3-120deg    Status  Achieved            Plan - 05/30/18 1552    Clinical Impression Statement  PT reassessed pt's goals and outcome measures this date. Pt has made great progress  overall since starting therapy. His AROM was 3-120deg this date and his tolerance to sitting has improved. He still has some pain with walking long distances and his main concern at this point is ambulating stairs reciprocally. While he could demo reciprocal pattern in clinic this date with 1-2 handrails, he states that he had increased knee pain and is not confident in it; he also acknowledges that his R knee is also playing a role in his stair ambulation limitations. PT educated pt that he was having difficulty with stairs for 3-4 years prior to his knee surgeries and it will take time to naturally improve. At this point, pt does not require any more skilled PT intervention as he is [facility] with all functional activities. PT updated strengthening HEP and provided pt with local gyms to access to continue his strengthening. Pt agreeable with this plan    Rehab Potential  Good    PT Frequency  3x / week    PT Duration  6 weeks    PT Treatment/Interventions  ADLs/Self Care Home Management;Cryotherapy;Therapeutic activities;Therapeutic exercise;Balance training;Gait training;Stair training;Functional mobility training;Patient/family education;Manual techniques    PT Next Visit Plan  discharged to HEP    PT Home Exercise Plan  eval:  quad set, active hamstring stretch and heelslide; 5/13: prone quad stretch, prone knee hang, SAQ; 6/3: stair ambulation; 6/5: seated LAQ, seated HS curls with band, bridging, fwd/lat step ups, sidestepping, hip abd, bridging    Consulted and Agree with Plan of Care  Patient       Patient will benefit from skilled therapeutic intervention in order to improve the following deficits and impairments:  Abnormal gait, Decreased activity tolerance, Decreased balance, Decreased mobility, Decreased range of motion, Difficulty walking, Increased edema, Pain, Impaired perceived functional ability  Visit Diagnosis: Stiffness of left knee, not elsewhere classified  Localized  edema  Difficulty in walking, not elsewhere classified  Muscle weakness (generalized)     Problem List Patient Active Problem List   Diagnosis Date Noted  . Primary localized osteoarthritis of left knee 03/27/2018  . S/P total knee arthroplasty 08/30/2016  . Weakness of left leg 03/13/2014  . Difficulty in walking(719.7) 03/13/2014  . Arthritis of knee, left 01/21/2014  . Cervicalgia 11/08/2011  . RHEUMATOID ARTHRITIS 02/01/2011  . RUPTURE ROTATOR CUFF 02/01/2011  . IMPINGEMENT SYNDROME 12/07/2010  . Primary localized osteoarthritis of right knee 09/28/2009  . Essential hypertension 05/13/2009     PHYSICAL THERAPY DISCHARGE SUMMARY  Visits from Start of Care: 17  Current functional level related to goals / functional outcomes: See above   Remaining deficits: See above   Education / Equipment: See above  Plan: Patient agrees to discharge.  Patient goals were met. Patient is being discharged due to meeting the stated rehab goals.  ?????        Geraldine Solar PT, [facility] [address] [place], [place], [address] Phone: [phone]   Fax:  [phone]  Name: Nicholas Delacruz  MRN: [id number] Date of Birth: August 03, 1966

## 2018-06-01 ENCOUNTER — Ambulatory Visit (HOSPITAL_COMMUNITY): Payer: 59

## 2018-06-06 DIAGNOSIS — G4733 Obstructive sleep apnea (adult) (pediatric): Secondary | ICD-10-CM | POA: Diagnosis not present

## 2018-06-11 DIAGNOSIS — M545 Low back pain: Secondary | ICD-10-CM | POA: Diagnosis not present

## 2018-07-05 DIAGNOSIS — R7989 Other specified abnormal findings of blood chemistry: Secondary | ICD-10-CM | POA: Diagnosis not present

## 2018-07-06 DIAGNOSIS — G4733 Obstructive sleep apnea (adult) (pediatric): Secondary | ICD-10-CM | POA: Diagnosis not present

## 2018-07-23 DIAGNOSIS — M25512 Pain in left shoulder: Secondary | ICD-10-CM | POA: Diagnosis not present

## 2018-07-23 DIAGNOSIS — M545 Low back pain: Secondary | ICD-10-CM | POA: Diagnosis not present

## 2018-07-27 ENCOUNTER — Other Ambulatory Visit: Payer: Self-pay | Admitting: Orthopedic Surgery

## 2018-07-27 DIAGNOSIS — M47816 Spondylosis without myelopathy or radiculopathy, lumbar region: Secondary | ICD-10-CM

## 2018-07-27 DIAGNOSIS — R7989 Other specified abnormal findings of blood chemistry: Secondary | ICD-10-CM | POA: Diagnosis not present

## 2018-08-06 ENCOUNTER — Ambulatory Visit
Admission: RE | Admit: 2018-08-06 | Discharge: 2018-08-06 | Disposition: A | Discharge status: Home / Self Care | Payer: 59 | Source: Ambulatory Visit | Attending: Orthopedic Surgery | Admitting: Orthopedic Surgery

## 2018-08-06 DIAGNOSIS — G4733 Obstructive sleep apnea (adult) (pediatric): Secondary | ICD-10-CM | POA: Diagnosis not present

## 2018-08-06 DIAGNOSIS — M47816 Spondylosis without myelopathy or radiculopathy, lumbar region: Secondary | ICD-10-CM

## 2018-08-06 DIAGNOSIS — M545 Low back pain: Secondary | ICD-10-CM | POA: Diagnosis not present

## 2018-08-30 DIAGNOSIS — R7989 Other specified abnormal findings of blood chemistry: Secondary | ICD-10-CM | POA: Diagnosis not present

## 2018-08-31 DIAGNOSIS — G4733 Obstructive sleep apnea (adult) (pediatric): Secondary | ICD-10-CM | POA: Diagnosis not present

## 2018-08-31 DIAGNOSIS — Z96659 Presence of unspecified artificial knee joint: Secondary | ICD-10-CM | POA: Diagnosis not present

## 2018-09-04 DIAGNOSIS — J329 Chronic sinusitis, unspecified: Secondary | ICD-10-CM | POA: Diagnosis not present

## 2018-09-04 DIAGNOSIS — G4730 Sleep apnea, unspecified: Secondary | ICD-10-CM | POA: Diagnosis not present

## 2018-09-05 DIAGNOSIS — M545 Low back pain: Secondary | ICD-10-CM | POA: Diagnosis not present

## 2018-09-06 DIAGNOSIS — G4733 Obstructive sleep apnea (adult) (pediatric): Secondary | ICD-10-CM | POA: Diagnosis not present

## 2018-09-11 DIAGNOSIS — M545 Low back pain: Secondary | ICD-10-CM | POA: Diagnosis not present

## 2018-09-11 DIAGNOSIS — M461 Sacroiliitis, not elsewhere classified: Secondary | ICD-10-CM | POA: Diagnosis not present

## 2018-09-28 DIAGNOSIS — R7989 Other specified abnormal findings of blood chemistry: Secondary | ICD-10-CM | POA: Diagnosis not present

## 2018-10-04 DIAGNOSIS — M461 Sacroiliitis, not elsewhere classified: Secondary | ICD-10-CM | POA: Diagnosis not present

## 2018-10-06 DIAGNOSIS — G4733 Obstructive sleep apnea (adult) (pediatric): Secondary | ICD-10-CM | POA: Diagnosis not present

## 2018-10-10 DIAGNOSIS — M25562 Pain in left knee: Secondary | ICD-10-CM | POA: Diagnosis not present

## 2018-10-22 DIAGNOSIS — Z23 Encounter for immunization: Secondary | ICD-10-CM | POA: Diagnosis not present

## 2018-11-06 DIAGNOSIS — G4733 Obstructive sleep apnea (adult) (pediatric): Secondary | ICD-10-CM | POA: Diagnosis not present

## 2018-11-07 DIAGNOSIS — M25562 Pain in left knee: Secondary | ICD-10-CM | POA: Diagnosis not present

## 2018-11-28 DIAGNOSIS — M25561 Pain in right knee: Secondary | ICD-10-CM | POA: Diagnosis not present

## 2018-11-28 DIAGNOSIS — Z96659 Presence of unspecified artificial knee joint: Secondary | ICD-10-CM | POA: Diagnosis not present

## 2018-11-28 DIAGNOSIS — G4733 Obstructive sleep apnea (adult) (pediatric): Secondary | ICD-10-CM | POA: Diagnosis not present

## 2018-11-28 DIAGNOSIS — M461 Sacroiliitis, not elsewhere classified: Secondary | ICD-10-CM | POA: Diagnosis not present

## 2018-11-30 DIAGNOSIS — M25562 Pain in left knee: Secondary | ICD-10-CM | POA: Diagnosis not present

## 2018-12-31 DIAGNOSIS — R7989 Other specified abnormal findings of blood chemistry: Secondary | ICD-10-CM | POA: Diagnosis not present

## 2019-01-02 DIAGNOSIS — M545 Low back pain: Secondary | ICD-10-CM | POA: Diagnosis not present

## 2019-01-02 DIAGNOSIS — R7989 Other specified abnormal findings of blood chemistry: Secondary | ICD-10-CM | POA: Diagnosis not present

## 2019-01-02 DIAGNOSIS — M461 Sacroiliitis, not elsewhere classified: Secondary | ICD-10-CM | POA: Diagnosis not present

## 2019-01-04 DIAGNOSIS — M25562 Pain in left knee: Secondary | ICD-10-CM | POA: Diagnosis not present

## 2019-01-04 IMAGING — DX DG KNEE 1-2V PORT*L*
1 series · 2 of 2 positions shown · non-contrast
Comparison: Left knee series dated June 10, 2014

CLINICAL DATA: The patient has undergone left unicompartmental knee
joint replacement.

EXAM:
PORTABLE LEFT KNEE - 1-2 VIEW

[Series 1: knee · 0.14mm/px · 2 of 2 slices shown]
[im 1/2]
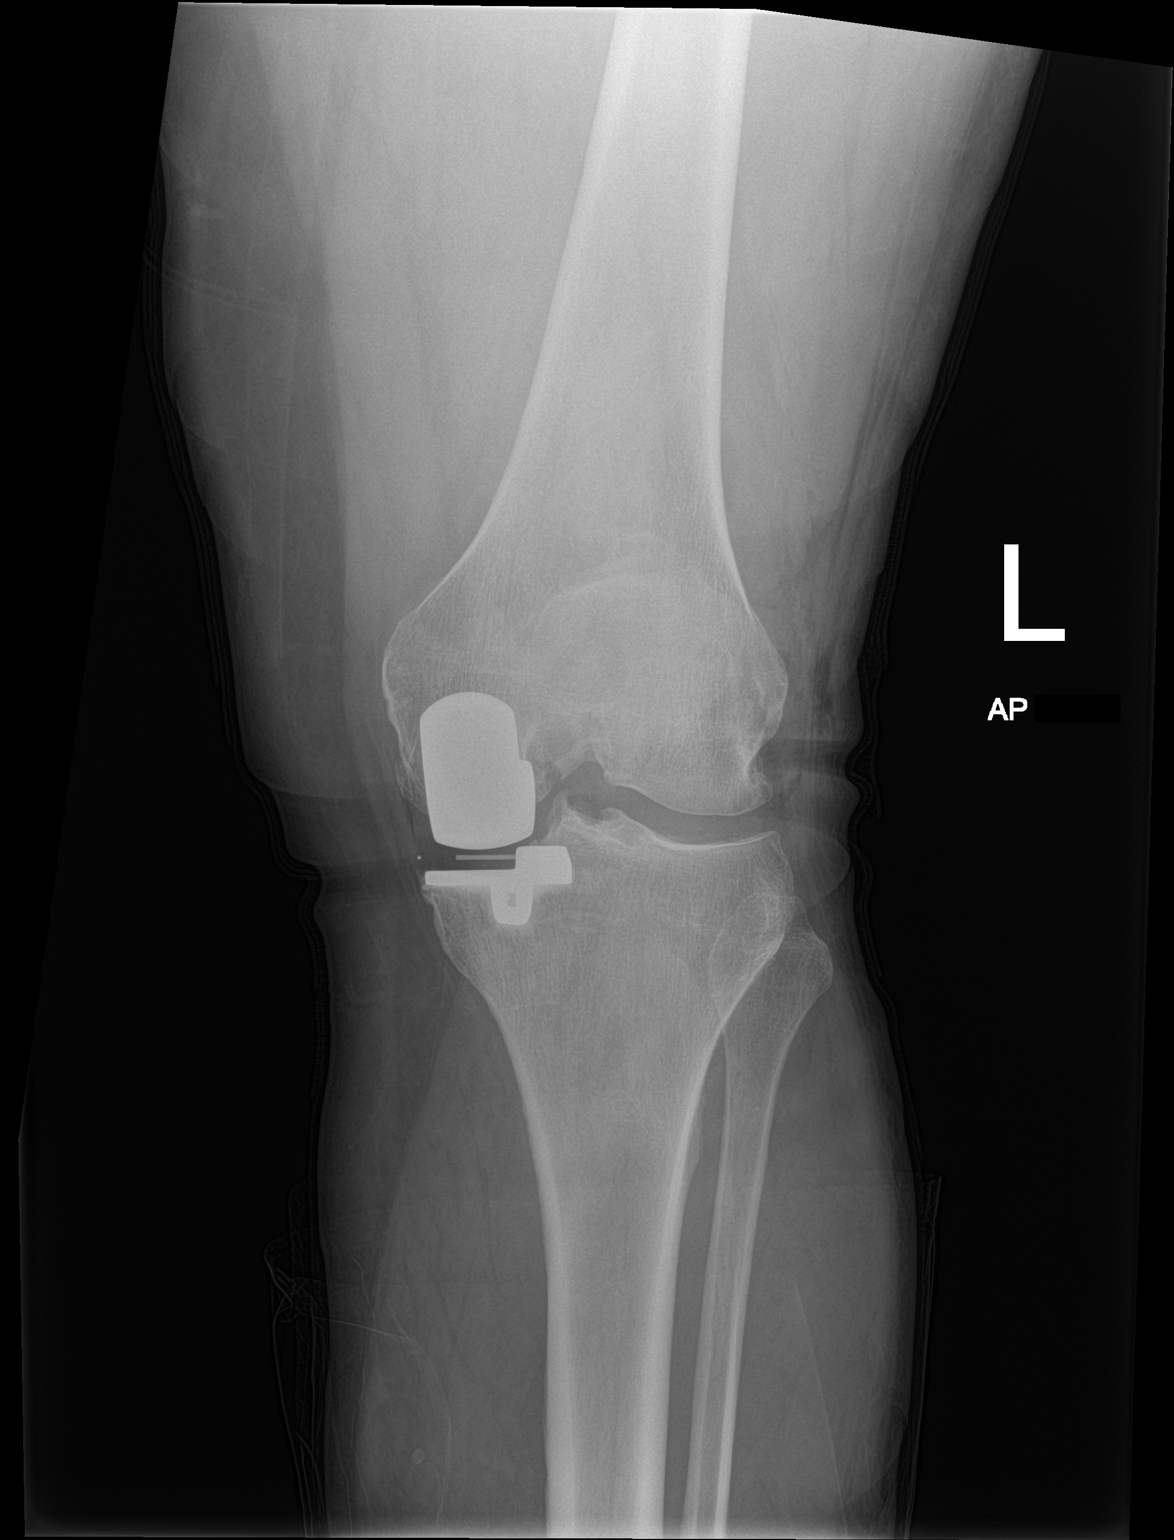
[im 2/2]
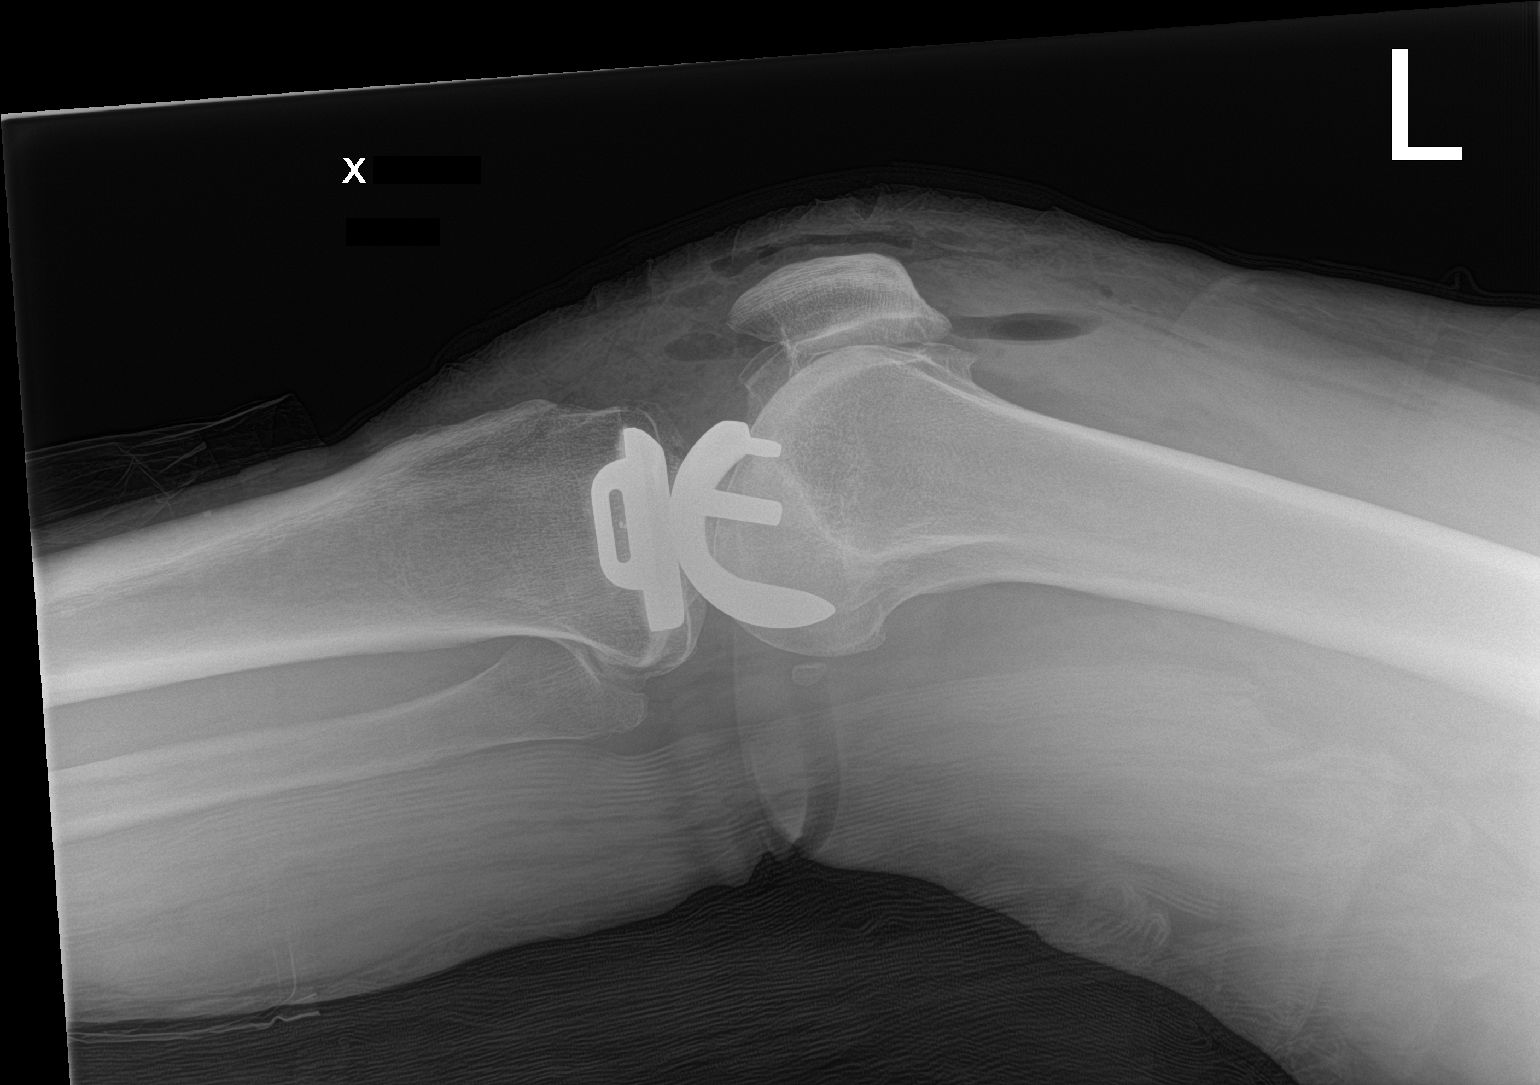

[2 of 2 positions shown; findings below may reference images not displayed]

FINDINGS: The patient has undergone a unicompartmental left knee joint
prosthesis placement. Radiographic positioning of the prosthetic
components is good. The interface with the native bone appears
normal. The lateral joint compartment and the patellofemoral
compartments are grossly normal. There is fluid and air within the
joint space.
IMPRESSION: No immediate postprocedure complication following left medial knee
joint replacement.

## 2019-01-31 DIAGNOSIS — R7989 Other specified abnormal findings of blood chemistry: Secondary | ICD-10-CM | POA: Diagnosis not present

## 2019-02-04 DIAGNOSIS — E119 Type 2 diabetes mellitus without complications: Secondary | ICD-10-CM | POA: Diagnosis not present

## 2019-02-04 DIAGNOSIS — E6609 Other obesity due to excess calories: Secondary | ICD-10-CM | POA: Diagnosis not present

## 2019-02-04 DIAGNOSIS — Z0001 Encounter for general adult medical examination with abnormal findings: Secondary | ICD-10-CM | POA: Diagnosis not present

## 2019-02-04 DIAGNOSIS — Z1389 Encounter for screening for other disorder: Secondary | ICD-10-CM | POA: Diagnosis not present

## 2019-02-04 DIAGNOSIS — Z6831 Body mass index (BMI) 31.0-31.9, adult: Secondary | ICD-10-CM | POA: Diagnosis not present

## 2019-02-04 DIAGNOSIS — J019 Acute sinusitis, unspecified: Secondary | ICD-10-CM | POA: Diagnosis not present

## 2019-02-06 DIAGNOSIS — M25562 Pain in left knee: Secondary | ICD-10-CM | POA: Diagnosis not present

## 2019-03-04 DIAGNOSIS — R7989 Other specified abnormal findings of blood chemistry: Secondary | ICD-10-CM | POA: Diagnosis not present

## 2019-03-08 DIAGNOSIS — G4733 Obstructive sleep apnea (adult) (pediatric): Secondary | ICD-10-CM | POA: Diagnosis not present

## 2019-03-20 DIAGNOSIS — M25562 Pain in left knee: Secondary | ICD-10-CM | POA: Diagnosis not present

## 2019-12-05 ENCOUNTER — Other Ambulatory Visit: Payer: Self-pay | Admitting: Orthopedic Surgery

## 2019-12-11 ENCOUNTER — Other Ambulatory Visit: Payer: Self-pay | Admitting: Orthopedic Surgery

## 2019-12-11 DIAGNOSIS — M25511 Pain in right shoulder: Secondary | ICD-10-CM

## 2020-01-07 ENCOUNTER — Other Ambulatory Visit: Payer: 59

## 2020-01-08 ENCOUNTER — Ambulatory Visit
Admission: RE | Admit: 2020-01-08 | Discharge: 2020-01-08 | Disposition: A | Discharge status: Home / Self Care | Payer: Medicare Other | Source: Ambulatory Visit | Attending: Orthopedic Surgery | Admitting: Orthopedic Surgery

## 2020-01-08 ENCOUNTER — Other Ambulatory Visit: Payer: Self-pay

## 2020-01-08 DIAGNOSIS — M25511 Pain in right shoulder: Secondary | ICD-10-CM

## 2020-02-29 ENCOUNTER — Ambulatory Visit: Payer: Medicare Other | Attending: Internal Medicine

## 2020-02-29 DIAGNOSIS — Z23 Encounter for immunization: Secondary | ICD-10-CM | POA: Insufficient documentation

## 2020-02-29 NOTE — Progress Notes (Signed)
   Covid-19 Vaccination Clinic  Name:  Nicholas Delacruz    MRN: [id number] DOB: 1966/08/15  02/29/2020  [name] was observed post Covid-19 immunization for 15 minutes without incident. He was provided with Vaccine Information Sheet and instruction to access the V-Safe system.   [name] was instructed to call 911 with any severe reactions post vaccine: Marland Kitchen Difficulty breathing  . Swelling of face and throat  . A fast heartbeat  . A bad rash all over body  . Dizziness and weakness   Immunizations Administered    Name Date Dose VIS Date Route   Moderna COVID-19 Vaccine 02/29/2020 11:40 AM 0.5 mL 11/26/2019 Intramuscular   Manufacturer: Moderna   LotIE:[id number]   [id number]

## 2020-04-01 ENCOUNTER — Ambulatory Visit: Payer: Medicare Other | Attending: Internal Medicine

## 2020-04-01 DIAGNOSIS — Z23 Encounter for immunization: Secondary | ICD-10-CM

## 2020-04-01 NOTE — Progress Notes (Signed)
   Covid-19 Vaccination Clinic  Name:  Nicholas Delacruz    MRN: [id number] DOB: 12/25/1966  04/01/2020  [name] was observed post Covid-19 immunization for 15 minutes without incident. He was provided with Vaccine Information Sheet and instruction to access the V-Safe system.   [name] was instructed to call 911 with any severe reactions post vaccine: Marland Kitchen Difficulty breathing  . Swelling of face and throat  . A fast heartbeat  . A bad rash all over body  . Dizziness and weakness   Immunizations Administered    Name Date Dose VIS Date Route   Moderna COVID-19 Vaccine 04/01/2020  1:59 PM 0.5 mL 11/26/2019 Intramuscular   Manufacturer: Levan Hurst   LotUD:[id number]   [place]:[id number]

## 2020-12-03 ENCOUNTER — Ambulatory Visit: Payer: Self-pay

## 2020-12-03 ENCOUNTER — Ambulatory Visit: Payer: Medicare Other | Attending: Internal Medicine

## 2020-12-03 DIAGNOSIS — Z23 Encounter for immunization: Secondary | ICD-10-CM

## 2020-12-03 NOTE — Telephone Encounter (Signed)
   [facility]            [name] Male, 54 y.o., May 29, 1966  MRN:  [id number] Phone:  [phone] [name])       PCP:  Sharilyn Sites, MD Primary Cvg:  Medicare/Medicare Part A And B  Next Appt With Lab ([address] [place], [place] [address]) 12/03/2020 at 2:15 PM         Message from Beverley Fiedler sent at 12/03/2020 9:31 AM EST  Summary: Clinical Advice   Patient wants to know can he still get his vaccine shot after receiving a steroid shot in his back yesterday          Call History   Type Contact Phone/Fax User  12/03/2020 09:31 AM EST Phone (Incoming) Moga, Makiah Clauson (Self) [phone] Lemmie Evens) Roberson, Nate A    Instructed pt. He could get COVID 19 vaccine. Verbalizes understanding.

## 2020-12-03 NOTE — Progress Notes (Signed)
   Covid-19 Vaccination Clinic  Name:  Nicholas Delacruz    MRN: [id number] DOB: 06-Jul-1966  12/03/2020  [name] was observed post Covid-19 immunization for 15 minutes without incident. He was provided with Vaccine Information Sheet and instruction to access the V-Safe system.   [name] was instructed to call 911 with any severe reactions post vaccine: Marland Kitchen Difficulty breathing  . Swelling of face and throat  . A fast heartbeat  . A bad rash all over body  . Dizziness and weakness   Immunizations Administered    No immunizations on file.

## 2022-11-09 ENCOUNTER — Other Ambulatory Visit: Payer: Self-pay

## 2022-11-09 ENCOUNTER — Emergency Department (HOSPITAL_COMMUNITY)
Admission: EM | Admit: 2022-11-09 | Discharge: 2022-11-09 | Disposition: A | Discharge status: Home / Self Care | Payer: Medicare Other | Attending: Emergency Medicine | Admitting: Emergency Medicine

## 2022-11-09 ENCOUNTER — Encounter (HOSPITAL_COMMUNITY): Payer: Self-pay

## 2022-11-09 DIAGNOSIS — T7840XA Allergy, unspecified, initial encounter: Secondary | ICD-10-CM | POA: Diagnosis present

## 2022-11-09 LAB — COMPREHENSIVE METABOLIC PANEL
ALT: 14 U/L (ref 0–44)
AST: 14 U/L — ABNORMAL LOW (ref 15–41)
Albumin: 3.6 g/dL (ref 3.5–5.0)
Alkaline Phosphatase: 70 U/L (ref 38–126)
Anion gap: 11 (ref 5–15)
BUN: 12 mg/dL (ref 6–20)
CO2: 26 mmol/L (ref 22–32)
Calcium: 9.1 mg/dL (ref 8.9–10.3)
Chloride: 98 mmol/L (ref 98–111)
Creatinine, Ser: 0.82 mg/dL (ref 0.61–1.24)
GFR, Estimated: >60 mL/min (ref >60)
Glucose, Bld: 152 mg/dL — ABNORMAL HIGH (ref 70–99)
Potassium: 3.7 mmol/L (ref 3.5–5.1)
Sodium: 135 mmol/L (ref 135–145)
Total Bilirubin: 0.4 mg/dL (ref 0.3–1.2)
Total Protein: 7.9 g/dL (ref 6.5–8.1)

## 2022-11-09 LAB — CBC WITH DIFFERENTIAL/PLATELET
Abs Immature Granulocytes: 0.04 10*3/uL (ref 0.00–0.07)
Basophils Absolute: 0 10*3/uL (ref 0.0–0.1)
Basophils Relative: 0 %
Eosinophils Absolute: 0.3 10*3/uL (ref 0.0–0.5)
Eosinophils Relative: 3 %
HCT: 37.1 % — ABNORMAL LOW (ref 39.0–52.0)
Hemoglobin: 11.1 g/dL — ABNORMAL LOW (ref 13.0–17.0)
Immature Granulocytes: 0 %
Lymphocytes Relative: 15 %
Lymphs Abs: 1.8 10*3/uL (ref 0.7–4.0)
MCH: 23.5 pg — ABNORMAL LOW (ref 26.0–34.0)
MCHC: 29.9 g/dL — ABNORMAL LOW (ref 30.0–36.0)
MCV: 78.4 fL — ABNORMAL LOW (ref 80.0–100.0)
Monocytes Absolute: 0.8 10*3/uL (ref 0.1–1.0)
Monocytes Relative: 7 %
Neutro Abs: 8.6 10*3/uL — ABNORMAL HIGH (ref 1.7–7.7)
Neutrophils Relative %: 75 %
Platelets: 510 10*3/uL — ABNORMAL HIGH (ref 150–400)
RBC: 4.73 MIL/uL (ref 4.22–5.81)
RDW: 15.5 % (ref 11.5–15.5)
WBC: 11.6 10*3/uL — ABNORMAL HIGH (ref 4.0–10.5)
nRBC: 0 % (ref 0.0–0.2)

## 2022-11-09 MED ORDER — SODIUM CHLORIDE-SODIUM BICARB 1.57 G NA PACK: 1.0000 | PACK | Freq: Two times a day (BID) | NASAL | 0 refills | Status: DC | PRN

## 2022-11-09 MED ORDER — PREDNISONE 20 MG PO TABS: ORAL_TABLET | ORAL | 0 refills | Status: DC

## 2022-11-09 MED ORDER — FAMOTIDINE IN NACL 20-0.9 MG/50ML-% IV SOLN
20.0000 mg | Freq: Once | INTRAVENOUS | Status: AC
  Administered 2022-11-09: 20 mg via INTRAVENOUS
  Filled 2022-11-09: qty 50

## 2022-11-09 MED ORDER — DIPHENHYDRAMINE HCL 25 MG PO TABS: 25.0000 mg | ORAL_TABLET | Freq: Four times a day (QID) | ORAL | 0 refills | Status: DC | PRN

## 2022-11-09 MED ORDER — OXYMETAZOLINE HCL 0.05 % NA SOLN
1.0000 | Freq: Once | NASAL | Status: AC
  Administered 2022-11-09: 1 via NASAL
  Filled 2022-11-09: qty 30

## 2022-11-09 MED ORDER — DIPHENHYDRAMINE HCL 50 MG/ML IJ SOLN
25.0000 mg | Freq: Once | INTRAMUSCULAR | Status: AC
  Administered 2022-11-09: 25 mg via INTRAVENOUS
  Filled 2022-11-09: qty 1

## 2022-11-09 MED ORDER — METHYLPREDNISOLONE SODIUM SUCC 125 MG IJ SOLR
125.0000 mg | Freq: Once | INTRAMUSCULAR | Status: AC
  Administered 2022-11-09: 125 mg via INTRAVENOUS
  Filled 2022-11-09: qty 2

## 2022-11-09 NOTE — ED Triage Notes (Signed)
Pt presents with allergic reaction to something unknown. Pt states he woke with his eye and lips swollen and itching to body. Pt states that he can feel his throat swollen some but breathing is ok and in no obvious distress.

## 2022-11-09 NOTE — ED Provider Notes (Signed)
[facility] EMERGENCY DEPARTMENT Provider Note   CSN: [id number] Arrival date & time: 11/09/22  0121     History  Chief Complaint  Patient presents with   Allergic Reaction    Nicholas Delacruz is a 56 y.o. male.  Patient states that he woke up today with hives all over and itching really bad, swollen lips, swollen eyes and a scratchiness in his throat.  Patient states he thought he had allergic reaction.  He is never had allergies before.  Had a little bit of nausea but no persistent symptoms.  Benadryl which helped with some of the rash.   Allergic Reaction      Home Medications Prior to Admission medications   Medication Sig Start Date End Date Taking? Authorizing Provider  baclofen (LIORESAL) 10 MG tablet Take 1 tablet (10 mg total) by mouth 3 (three) times daily. As needed for muscle spasm 03/27/18   Marchia Bond, MD  diltiazem (CARDIZEM CD) 300 MG 24 hr capsule Take 300 mg by mouth at bedtime.  05/25/16   [provider]  labetalol (NORMODYNE) 200 MG tablet Take 200 mg by mouth 2 (two) times daily.  06/28/11   [provider]  losartan-hydrochlorothiazide (HYZAAR) 100-25 MG tablet Take 1 tablet by mouth daily.  09/04/17   [provider]  metFORMIN (GLUCOPHAGE) 500 MG tablet Take 500 mg by mouth 2 (two) times daily with a meal.    [provider]  omeprazole (PRILOSEC) 40 MG capsule Take 40 mg by mouth daily.  09/29/17   [provider]  ondansetron (ZOFRAN) 4 MG tablet Take 1 tablet (4 mg total) by mouth every 8 (eight) hours as needed for nausea or vomiting. 03/27/18   Marchia Bond, MD  oxyCODONE (ROXICODONE) 5 MG immediate release tablet Take 1 tablet (5 mg total) by mouth every 4 (four) hours as needed for severe pain. 03/27/18   Marchia Bond, MD  rivaroxaban (XARELTO) 10 MG TABS tablet Take 1 tablet (10 mg total) by mouth daily. 03/27/18   Marchia Bond, MD  sennosides-docusate sodium (SENOKOT-S) 8.6-50 MG tablet Take 2 tablets by  mouth daily. 03/27/18   Marchia Bond, MD  VOLTAREN 1 % GEL APPLY 4GM TOPICALLY 4 TIMES DAILY AS DIRECTED. 12/06/19   Carole Civil, MD  zolpidem (AMBIEN) 10 MG tablet Take 10 mg by mouth at bedtime.     [provider]      Allergies    Levofloxacin    Review of Systems   Review of Systems  Physical Exam Updated Vital Signs BP (!) 157/84   Pulse 68   Resp 17   Ht 6' (1.829 m)   Wt 124.7 kg   SpO2 96%   BMI 37.30 kg/m  Physical Exam Vitals and nursing note reviewed.  Constitutional:      Appearance: He is well-developed.  HENT:     Head: Normocephalic and atraumatic.     Nose: Nose normal. No congestion or rhinorrhea.     Mouth/Throat:     Comments: Edematous lips and periorbital edema as well.  No sublingual edema.  No stridor. Cardiovascular:     Rate and Rhythm: Normal rate.  Pulmonary:     Effort: Pulmonary effort is normal. No respiratory distress.  Abdominal:     General: There is no distension.  Musculoskeletal:        General: Normal range of motion.     Cervical back: Normal range of motion.  Neurological:     Mental Status:  He is alert.     ED Results / Procedures / Treatments   Labs (all labs ordered are listed, but only abnormal results are displayed) Labs Reviewed  CBC WITH DIFFERENTIAL/PLATELET - Abnormal; Notable for the following components:      Result Value   WBC 11.6 (*)    Hemoglobin 11.1 (*)    HCT 37.1 (*)    MCV 78.4 (*)    MCH 23.5 (*)    MCHC 29.9 (*)    Platelets 510 (*)    Neutro Abs 8.6 (*)    All other components within normal limits  COMPREHENSIVE METABOLIC PANEL - Abnormal; Notable for the following components:   Glucose, Bld 152 (*)    AST 14 (*)    All other components within normal limits    EKG None  Radiology No results found.  Procedures Procedures    Medications Ordered in ED Medications  methylPREDNISolone sodium succinate (SOLU-MEDROL) 125 mg/2 mL injection 125 mg (125 mg Intravenous  Given 11/09/22 0212)  famotidine (PEPCID) IVPB 20 mg premix (0 mg Intravenous Stopped 11/09/22 0300)  diphenhydrAMINE (BENADRYL) injection 25 mg (25 mg Intravenous Given 11/09/22 0213)    ED Course/ Medical Decision Making/ A&P                           Medical Decision Making Amount and/or Complexity of Data Reviewed Labs: ordered.  Risk OTC drugs. Prescription drug management.   Seems consistent with likely allergic reaction possibly than anaphylaxis however this time the rash is improved is not having other nausea and now just has angioedema.  If it was just angioedema get blamed on medication or something else but when he had the rash and nausea as well concern for allergic causes we will treat for the same and observe for improvement but more so to ensure is not worsening.  Observed for multiple hours without recurrent of symptoms.  Patient will do a steroid burst at home with intermittent Benadryl as needed.  Final Clinical Impression(s) / ED Diagnoses Final diagnoses:  None    Rx / DC Orders ED Discharge Orders     None         Jairy Angulo, Corene Cornea, MD 11/09/22 0532

## 2023-05-17 ENCOUNTER — Ambulatory Visit: Payer: 59 | Admitting: "Endocrinology

## 2023-07-22 ENCOUNTER — Ambulatory Visit
Admission: EM | Admit: 2023-07-22 | Discharge: 2023-07-22 | Disposition: A | Discharge status: Home / Self Care | Payer: 59 | Attending: Family Medicine | Admitting: Family Medicine

## 2023-07-22 DIAGNOSIS — U071 COVID-19: Secondary | ICD-10-CM

## 2023-07-22 MED ORDER — PROMETHAZINE-DM 6.25-15 MG/5ML PO SYRP: 5.0000 mL | ORAL_SOLUTION | Freq: Four times a day (QID) | ORAL | 0 refills | Status: DC | PRN

## 2023-07-22 MED ORDER — MOLNUPIRAVIR EUA 200MG CAPSULE: 4.0000 | ORAL_CAPSULE | Freq: Two times a day (BID) | ORAL | 0 refills | Status: AC

## 2023-07-22 NOTE — ED Triage Notes (Signed)
Pt reports he has a fever, cough, chest congestion, and chest x 1 day.  Took ibuprofen    Home covid test was positive.

## 2023-07-22 NOTE — ED Provider Notes (Signed)
[facility]    CSN: [id number] Arrival date & time: 07/22/23  1451      History   Chief Complaint No chief complaint on file.   HPI Nicholas Delacruz is a 57 y.o. male.   Presenting today with 1 day history of fever, productive cough, chest congestion, nasal congestion, fatigue.  Denies chest pain, shortness of breath, abdominal pain, nausea vomiting or diarrhea.  So far taking ibuprofen with minimal relief.  Home COVID test positive this morning.  No known history of chronic pulmonary disease.    Past Medical History:  Diagnosis Date   Arthritis    Diabetes mellitus without complication (HCC)    GERD (gastroesophageal reflux disease)    High blood pressure    Obstructive sleep apnea on CPAP    BIPAP   PONV (postoperative nausea and vomiting)    blood pressure dropped in recovery   Primary localized osteoarthritis of left knee 03/27/2018    Patient Active Problem List   Diagnosis Date Noted   Primary localized osteoarthritis of left knee 03/27/2018   S/P total knee arthroplasty 08/30/2016   Weakness of left leg 03/13/2014   Difficulty in walking(719.7) 03/13/2014   Arthritis of knee, left 01/21/2014   Cervicalgia 11/08/2011   RHEUMATOID ARTHRITIS 02/01/2011   RUPTURE ROTATOR CUFF 02/01/2011   IMPINGEMENT SYNDROME 12/07/2010   Primary localized osteoarthritis of right knee 09/28/2009   Essential hypertension 05/13/2009    Past Surgical History:  Procedure Laterality Date   APPENDECTOMY     KNEE ARTHROPLASTY     KNEE ARTHROSCOPY Right 01/02/2015   Procedure: RIGHT KNEE ARTHROSCOPY, LATERAL AND MEDIAL MENISECTOMY;  Surgeon: Vickki Hearing, MD;  Location: [facility];  Service: Orthopedics;  Laterality: Right;   KNEE ARTHROSCOPY WITH MEDIAL MENISECTOMY Left 06/25/2014   Procedure: KNEE ARTHROSCOPY WITH MEDIAL MENISECTOMY;  Surgeon: Vickki Hearing, MD;  Location: [facility];  Service: Orthopedics;  Laterality: Left;   KNEE SURGERY Left    MENISCUS DEBRIDEMENT  Right 01/02/2015   Procedure: DEBRIDEMENT OF RIGHT KNEE JOINT;  Surgeon: Vickki Hearing, MD;  Location: [facility];  Service: Orthopedics;  Laterality: Right;   PARTIAL KNEE ARTHROPLASTY Left 03/27/2018   Procedure: UNICOMPARTMENTAL LEFT KNEE;  Surgeon: Teryl Lucy, MD;  Location: [facility];  Service: Orthopedics;  Laterality: Left;   right knee sark     SHOULDER SURGERY     left   TOTAL KNEE ARTHROPLASTY Right 08/30/2016   Procedure: TOTAL KNEE ARTHROPLASTY;  Surgeon: Teryl Lucy, MD;  Location: [facility];  Service: Orthopedics;  Laterality: Right;   TOTAL SHOULDER ARTHROPLASTY         Home Medications    Prior to Admission medications   Medication Sig Start Date End Date Taking? Authorizing Provider  molnupiravir EUA (LAGEVRIO) 200 mg CAPS capsule Take 4 capsules (800 mg total) by mouth 2 (two) times daily for 5 days. 07/22/23 07/27/23 Yes Particia Nearing, PA-C  promethazine-dextromethorphan (PROMETHAZINE-DM) 6.25-15 MG/5ML syrup Take 5 mLs by mouth 4 (four) times daily as needed. 07/22/23  Yes Particia Nearing, PA-C  baclofen (LIORESAL) 10 MG tablet Take 1 tablet (10 mg total) by mouth 3 (three) times daily. As needed for muscle spasm 03/27/18   Teryl Lucy, MD  diltiazem (CARDIZEM CD) 300 MG 24 hr capsule Take 300 mg by mouth at bedtime.  05/25/16   [provider]  diphenhydrAMINE (BENADRYL) 25 MG tablet Take 1 tablet (25 mg total) by mouth every 6 (six) hours as needed. 11/09/22  Mesner, Barbara Cower, MD  labetalol (NORMODYNE) 200 MG tablet Take 200 mg by mouth 2 (two) times daily.  06/28/11   [provider]  losartan-hydrochlorothiazide (HYZAAR) 100-25 MG tablet Take 1 tablet by mouth daily.  09/04/17   [provider]  metFORMIN (GLUCOPHAGE) 500 MG tablet Take 500 mg by mouth 2 (two) times daily with a meal.    [provider]  omeprazole (PRILOSEC) 40 MG capsule Take 40 mg by mouth daily.  09/29/17   [provider]  ondansetron (ZOFRAN) 4 MG  tablet Take 1 tablet (4 mg total) by mouth every 8 (eight) hours as needed for nausea or vomiting. 03/27/18   Teryl Lucy, MD  oxyCODONE (ROXICODONE) 5 MG immediate release tablet Take 1 tablet (5 mg total) by mouth every 4 (four) hours as needed for severe pain. 03/27/18   Teryl Lucy, MD  predniSONE (DELTASONE) 20 MG tablet 3 tabs po day one, then 2 tabs daily x 4 days 11/09/22   Mesner, Barbara Cower, MD  rivaroxaban (XARELTO) 10 MG TABS tablet Take 1 tablet (10 mg total) by mouth daily. 03/27/18   Teryl Lucy, MD  sennosides-docusate sodium (SENOKOT-S) 8.6-50 MG tablet Take 2 tablets by mouth daily. 03/27/18   Teryl Lucy, MD  Sodium Chloride-Sodium Bicarb 1.57 g PACK Place 1 each into the nose 2 (two) times daily as needed for up to 5 days. 11/09/22 11/14/22  Mesner, Barbara Cower, MD  VOLTAREN 1 % GEL APPLY 4GM TOPICALLY 4 TIMES DAILY AS DIRECTED. 12/06/19   Vickki Hearing, MD  zolpidem (AMBIEN) 10 MG tablet Take 10 mg by mouth at bedtime.     [provider]    Family History Family History  Problem Relation Age of Onset   Alzheimer's disease Father    Arthritis Unknown     Social History Social History   Tobacco Use   Smoking status: Never   Smokeless tobacco: Never  Vaping Use   Vaping status: Never Used  Substance Use Topics   Alcohol use: Yes    Comment: socially   Drug use: No     Allergies   Levofloxacin   Review of Systems Review of Systems Per HPI  Physical Exam Triage Vital Signs ED Triage Vitals [07/22/23 1512]  Encounter Vitals Group     BP (!) 144/85     Systolic BP Percentile      Diastolic BP Percentile      Pulse Rate 80     Resp 18     Temp 99 F (37.2 C)     Temp Source Oral     SpO2 95 %     Weight      Height      Head Circumference      Peak Flow      Pain Score 4     Pain Loc      Pain Education      Exclude from Growth Chart    No data found.  Updated Vital Signs BP (!) 144/85 (BP Location: Right Arm)   Pulse 80   Temp  99 F (37.2 C) (Oral)   Resp 18   SpO2 95%   Visual Acuity Right Eye Distance:   Left Eye Distance:   Bilateral Distance:    Right Eye Near:   Left Eye Near:    Bilateral Near:     Physical Exam Vitals and nursing note reviewed.  Constitutional:      Appearance: He is well-developed.  HENT:  Head: Atraumatic.     Right Ear: External ear normal.     Left Ear: External ear normal.     Nose: Rhinorrhea present.     Mouth/Throat:     Pharynx: Posterior oropharyngeal erythema present. No oropharyngeal exudate.  Eyes:     Conjunctiva/sclera: Conjunctivae normal.     Pupils: Pupils are equal, round, and reactive to light.  Cardiovascular:     Rate and Rhythm: Normal rate and regular rhythm.  Pulmonary:     Effort: Pulmonary effort is normal. No respiratory distress.     Breath sounds: No wheezing or rales.  Musculoskeletal:        General: Normal range of motion.     Cervical back: Normal range of motion and neck supple.  Lymphadenopathy:     Cervical: No cervical adenopathy.  Skin:    General: Skin is warm and dry.  Neurological:     Mental Status: He is alert and oriented to person, place, and time.  Psychiatric:        Behavior: Behavior normal.      UC Treatments / Results  Labs (all labs ordered are listed, but only abnormal results are displayed) Labs Reviewed - No data to display  EKG   Radiology No results found.  Procedures Procedures (including critical care time)  Medications Ordered in UC Medications - No data to display  Initial Impression / Assessment and Plan / UC Course  I have reviewed the triage vital signs and the nursing notes.  Pertinent labs & imaging results that were available during my care of the patient were reviewed by me and considered in my medical decision making (see chart for details).     Vitals and exam reassuring today, consistent with COVID-19 which was positive on home test today.  Will treat with molnupiravir,  Phenergan DM, supportive over-the-counter medications and home care.  Return for worsening symptoms.  Final Clinical Impressions(s) / UC Diagnoses   Final diagnoses:  COVID-19     Discharge Instructions      in addition to the prescribed medications, you may take Flonase, Coricidin HBP, plain Mucinex.  Drink plenty of fluids, get lots of rest.    ED Prescriptions     Medication Sig Dispense Auth. Provider   molnupiravir EUA (LAGEVRIO) 200 mg CAPS capsule Take 4 capsules (800 mg total) by mouth 2 (two) times daily for 5 days. 40 capsule Particia Nearing, [place]   promethazine-dextromethorphan (PROMETHAZINE-DM) 6.25-15 MG/5ML syrup Take 5 mLs by mouth 4 (four) times daily as needed. 100 mL Particia Nearing, [place]      PDMP not reviewed this encounter.   Particia Nearing, [place] 07/22/23 1542

## 2023-07-22 NOTE — Discharge Instructions (Signed)
in addition to the prescribed medications, you may take Flonase, Coricidin HBP, plain Mucinex.  Drink plenty of fluids, get lots of rest.

## 2023-08-04 ENCOUNTER — Encounter: Payer: Self-pay | Admitting: Emergency Medicine

## 2023-08-04 ENCOUNTER — Ambulatory Visit
Admission: EM | Admit: 2023-08-04 | Discharge: 2023-08-04 | Disposition: A | Discharge status: Home / Self Care | Payer: 59 | Attending: Family Medicine | Admitting: Family Medicine

## 2023-08-04 DIAGNOSIS — L0100 Impetigo, unspecified: Secondary | ICD-10-CM

## 2023-08-04 MED ORDER — MUPIROCIN 2 % EX OINT: 1.0000 | TOPICAL_OINTMENT | Freq: Two times a day (BID) | CUTANEOUS | 0 refills | Status: AC

## 2023-08-04 MED ORDER — CEPHALEXIN 500 MG PO CAPS: 500.0000 mg | ORAL_CAPSULE | Freq: Two times a day (BID) | ORAL | 0 refills | Status: DC

## 2023-08-04 NOTE — Discharge Instructions (Signed)
I suspect the issue to be a bacterial infection from irritation of the area from being sick. I have sent an antibiotic and ointment to help with this, but if you are not improving or worsen follow up for re-check

## 2023-08-04 NOTE — ED Provider Notes (Signed)
[facility]    CSN: [id number] Arrival date & time: 08/04/23  0808      History   Chief Complaint No chief complaint on file.   HPI Nicholas Delacruz is a 57 y.o. male.   Patient presenting today with several day history of red pimple type bumps just below the nostrils and now surrounding erythema, edema and drainage to the areas.  States he had COVID 2 weeks ago and had a lot of nasal congestion at that time.  States those symptoms have cleared but now the irritation below his nose has developed.  He is unsure if it is a fever blister.  So far not trying anything for symptoms.  States he did squeeze one of the areas and drainage came out.     Past Medical History:  Diagnosis Date   Arthritis    Diabetes mellitus without complication (HCC)    GERD (gastroesophageal reflux disease)    High blood pressure    Obstructive sleep apnea on CPAP    BIPAP   PONV (postoperative nausea and vomiting)    blood pressure dropped in recovery   Primary localized osteoarthritis of left knee 03/27/2018    Patient Active Problem List   Diagnosis Date Noted   Primary localized osteoarthritis of left knee 03/27/2018   S/P total knee arthroplasty 08/30/2016   Weakness of left leg 03/13/2014   Difficulty in walking(719.7) 03/13/2014   Arthritis of knee, left 01/21/2014   Cervicalgia 11/08/2011   RHEUMATOID ARTHRITIS 02/01/2011   RUPTURE ROTATOR CUFF 02/01/2011   IMPINGEMENT SYNDROME 12/07/2010   Primary localized osteoarthritis of right knee 09/28/2009   Essential hypertension 05/13/2009    Past Surgical History:  Procedure Laterality Date   APPENDECTOMY     KNEE ARTHROPLASTY     KNEE ARTHROSCOPY Right 01/02/2015   Procedure: RIGHT KNEE ARTHROSCOPY, LATERAL AND MEDIAL MENISECTOMY;  Surgeon: Vickki Hearing, MD;  Location: [facility];  Service: Orthopedics;  Laterality: Right;   KNEE ARTHROSCOPY WITH MEDIAL MENISECTOMY Left 06/25/2014   Procedure: KNEE ARTHROSCOPY WITH MEDIAL  MENISECTOMY;  Surgeon: Vickki Hearing, MD;  Location: [facility];  Service: Orthopedics;  Laterality: Left;   KNEE SURGERY Left    MENISCUS DEBRIDEMENT Right 01/02/2015   Procedure: DEBRIDEMENT OF RIGHT KNEE JOINT;  Surgeon: Vickki Hearing, MD;  Location: [facility];  Service: Orthopedics;  Laterality: Right;   PARTIAL KNEE ARTHROPLASTY Left 03/27/2018   Procedure: UNICOMPARTMENTAL LEFT KNEE;  Surgeon: Teryl Lucy, MD;  Location: [facility];  Service: Orthopedics;  Laterality: Left;   right knee sark     SHOULDER SURGERY     left   TOTAL KNEE ARTHROPLASTY Right 08/30/2016   Procedure: TOTAL KNEE ARTHROPLASTY;  Surgeon: Teryl Lucy, MD;  Location: [facility];  Service: Orthopedics;  Laterality: Right;   TOTAL SHOULDER ARTHROPLASTY         Home Medications    Prior to Admission medications   Medication Sig Start Date End Date Taking? Authorizing Provider  cephALEXin (KEFLEX) 500 MG capsule Take 1 capsule (500 mg total) by mouth 2 (two) times daily. 08/04/23  Yes Particia Nearing, PA-C  mupirocin ointment (BACTROBAN) 2 % Apply 1 Application topically 2 (two) times daily. 08/04/23  Yes Particia Nearing, PA-C  diltiazem (CARDIZEM CD) 300 MG 24 hr capsule Take 300 mg by mouth at bedtime.  05/25/16   [provider]  labetalol (NORMODYNE) 200 MG tablet Take 200 mg by mouth 2 (two) times daily.  06/28/11  [provider]  losartan-hydrochlorothiazide (HYZAAR) 100-25 MG tablet Take 1 tablet by mouth daily.  09/04/17   [provider]  metFORMIN (GLUCOPHAGE) 500 MG tablet Take 500 mg by mouth 2 (two) times daily with a meal.    [provider]  omeprazole (PRILOSEC) 40 MG capsule Take 40 mg by mouth daily.  09/29/17   [provider]  ondansetron (ZOFRAN) 4 MG tablet Take 1 tablet (4 mg total) by mouth every 8 (eight) hours as needed for nausea or vomiting. 03/27/18   Teryl Lucy, MD  VOLTAREN 1 % GEL APPLY 4GM TOPICALLY 4 TIMES DAILY AS DIRECTED. 12/06/19    Vickki Hearing, MD  zolpidem (AMBIEN) 10 MG tablet Take 10 mg by mouth at bedtime.     [provider]    Family History Family History  Problem Relation Age of Onset   Alzheimer's disease Father    Arthritis Unknown     Social History Social History   Tobacco Use   Smoking status: Never   Smokeless tobacco: Never  Vaping Use   Vaping status: Never Used  Substance Use Topics   Alcohol use: Yes    Comment: socially   Drug use: No     Allergies   Levofloxacin   Review of Systems Review of Systems Per HPI  Physical Exam Triage Vital Signs ED Triage Vitals  Encounter Vitals Group     BP 08/04/23 0812 (!) 165/93     Systolic BP Percentile --      Diastolic BP Percentile --      Pulse Rate 08/04/23 0812 75     Resp 08/04/23 0812 16     Temp 08/04/23 0812 98.2 F (36.8 C)     Temp Source 08/04/23 0812 Oral     SpO2 08/04/23 0812 97 %     Weight --      Height --      Head Circumference --      Peak Flow --      Pain Score 08/04/23 0815 5     Pain Loc --      Pain Education --      Exclude from Growth Chart --    No data found.  Updated Vital Signs BP (!) 165/93 (BP Location: Right Arm)   Pulse 75   Temp 98.2 F (36.8 C) (Oral)   Resp 16   SpO2 97%   Visual Acuity Right Eye Distance:   Left Eye Distance:   Bilateral Distance:    Right Eye Near:   Left Eye Near:    Bilateral Near:     Physical Exam Vitals and nursing note reviewed.  Constitutional:      Appearance: Normal appearance.  HENT:     Head: Atraumatic.  Eyes:     Extraocular Movements: Extraocular movements intact.     Conjunctiva/sclera: Conjunctivae normal.  Cardiovascular:     Rate and Rhythm: Normal rate and regular rhythm.  Pulmonary:     Effort: Pulmonary effort is normal.     Breath sounds: Normal breath sounds.  Musculoskeletal:        General: Normal range of motion.     Cervical back: Normal range of motion and neck supple.  Skin:    General: Skin is  warm and dry.     Findings: Erythema present.     Comments: Area between upper lip and nostrils erythematous, edematous, slightly crusted and with pustules  Neurological:     Mental Status: He  is oriented to person, place, and time.  Psychiatric:        Mood and Affect: Mood normal.        Thought Content: Thought content normal.        Judgment: Judgment normal.      UC Treatments / Results  Labs (all labs ordered are listed, but only abnormal results are displayed) Labs Reviewed - No data to display  EKG   Radiology No results found.  Procedures Procedures (including critical care time)  Medications Ordered in UC Medications - No data to display  Initial Impression / Assessment and Plan / UC Course  I have reviewed the triage vital signs and the nursing notes.  Pertinent labs & imaging results that were available during my care of the patient were reviewed by me and considered in my medical decision making (see chart for details).     More consistent with a bacterial infection than a fever blister based on exam.  Treat with Keflex, mupirocin, keep area clean.  Return for worsening symptoms.  Final Clinical Impressions(s) / UC Diagnoses   Final diagnoses:  Impetigo     Discharge Instructions      I suspect the issue to be a bacterial infection from irritation of the area from being sick. I have sent an antibiotic and ointment to help with this, but if you are not improving or worsen follow up for re-check    ED Prescriptions     Medication Sig Dispense Auth. Provider   cephALEXin (KEFLEX) 500 MG capsule Take 1 capsule (500 mg total) by mouth 2 (two) times daily. 14 capsule Particia Nearing, [place]   mupirocin ointment (BACTROBAN) 2 % Apply 1 Application topically 2 (two) times daily. 22 g Particia Nearing, [place]      PDMP not reviewed this encounter.   Roosvelt Maser [place], [place] 08/04/23 [phone]

## 2023-08-04 NOTE — ED Triage Notes (Signed)
Hx of covid 2 weeks ago.  Was given covid medication.  3 days ago, he developed a blister on upper lip.

## 2024-03-27 ENCOUNTER — Encounter (HOSPITAL_COMMUNITY): Payer: Self-pay

## 2024-03-27 ENCOUNTER — Observation Stay (HOSPITAL_COMMUNITY)
Admission: EM | Admit: 2024-03-27 | Discharge: 2024-03-28 | Disposition: A | Discharge status: Home / Self Care | Attending: Internal Medicine | Admitting: Internal Medicine

## 2024-03-27 DIAGNOSIS — Z79899 Other long term (current) drug therapy: Secondary | ICD-10-CM | POA: Insufficient documentation

## 2024-03-27 DIAGNOSIS — E8809 Other disorders of plasma-protein metabolism, not elsewhere classified: Secondary | ICD-10-CM | POA: Insufficient documentation

## 2024-03-27 DIAGNOSIS — Z7984 Long term (current) use of oral hypoglycemic drugs: Secondary | ICD-10-CM | POA: Insufficient documentation

## 2024-03-27 DIAGNOSIS — E1140 Type 2 diabetes mellitus with diabetic neuropathy, unspecified: Secondary | ICD-10-CM | POA: Diagnosis not present

## 2024-03-27 DIAGNOSIS — F1910 Other psychoactive substance abuse, uncomplicated: Secondary | ICD-10-CM | POA: Insufficient documentation

## 2024-03-27 DIAGNOSIS — I6523 Occlusion and stenosis of bilateral carotid arteries: Secondary | ICD-10-CM | POA: Diagnosis not present

## 2024-03-27 DIAGNOSIS — I639 Cerebral infarction, unspecified: Principal | ICD-10-CM

## 2024-03-27 DIAGNOSIS — R410 Disorientation, unspecified: Secondary | ICD-10-CM | POA: Diagnosis present

## 2024-03-27 DIAGNOSIS — F1410 Cocaine abuse, uncomplicated: Secondary | ICD-10-CM | POA: Diagnosis not present

## 2024-03-27 DIAGNOSIS — E1165 Type 2 diabetes mellitus with hyperglycemia: Secondary | ICD-10-CM | POA: Insufficient documentation

## 2024-03-27 DIAGNOSIS — G4733 Obstructive sleep apnea (adult) (pediatric): Secondary | ICD-10-CM | POA: Insufficient documentation

## 2024-03-27 DIAGNOSIS — D75839 Thrombocytosis, unspecified: Secondary | ICD-10-CM | POA: Diagnosis not present

## 2024-03-27 DIAGNOSIS — I6381 Other cerebral infarction due to occlusion or stenosis of small artery: Secondary | ICD-10-CM | POA: Diagnosis not present

## 2024-03-27 DIAGNOSIS — D649 Anemia, unspecified: Secondary | ICD-10-CM | POA: Insufficient documentation

## 2024-03-27 DIAGNOSIS — R479 Unspecified speech disturbances: Secondary | ICD-10-CM | POA: Insufficient documentation

## 2024-03-27 DIAGNOSIS — I429 Cardiomyopathy, unspecified: Secondary | ICD-10-CM | POA: Diagnosis not present

## 2024-03-27 DIAGNOSIS — R9082 White matter disease, unspecified: Secondary | ICD-10-CM | POA: Insufficient documentation

## 2024-03-27 DIAGNOSIS — Z7901 Long term (current) use of anticoagulants: Secondary | ICD-10-CM | POA: Insufficient documentation

## 2024-03-27 DIAGNOSIS — Z6831 Body mass index (BMI) 31.0-31.9, adult: Secondary | ICD-10-CM | POA: Diagnosis not present

## 2024-03-27 DIAGNOSIS — E876 Hypokalemia: Secondary | ICD-10-CM | POA: Diagnosis not present

## 2024-03-27 DIAGNOSIS — E66811 Obesity, class 1: Secondary | ICD-10-CM | POA: Diagnosis not present

## 2024-03-27 DIAGNOSIS — F1090 Alcohol use, unspecified, uncomplicated: Secondary | ICD-10-CM | POA: Diagnosis not present

## 2024-03-27 DIAGNOSIS — Z1152 Encounter for screening for COVID-19: Secondary | ICD-10-CM | POA: Diagnosis not present

## 2024-03-27 DIAGNOSIS — R4182 Altered mental status, unspecified: Secondary | ICD-10-CM

## 2024-03-27 DIAGNOSIS — M1712 Unilateral primary osteoarthritis, left knee: Secondary | ICD-10-CM | POA: Diagnosis not present

## 2024-03-27 DIAGNOSIS — E46 Unspecified protein-calorie malnutrition: Secondary | ICD-10-CM | POA: Diagnosis not present

## 2024-03-27 DIAGNOSIS — I427 Cardiomyopathy due to drug and external agent: Secondary | ICD-10-CM

## 2024-03-27 NOTE — ED Triage Notes (Addendum)
 [facility] from home. CC of confusion. Per EMS patient has been confused since 11am yesterday. Said "the numbers on facebook" but would not elaborate. EMS stroke screen neg.  Patient ambulatory and on cell phone upon arrival. When asked during triage, he said "he just can't focus"   Per ems he stopped his gabapentin 2 days ago after being on it for a month.

## 2024-03-28 ENCOUNTER — Other Ambulatory Visit (HOSPITAL_COMMUNITY): Payer: Self-pay | Admitting: *Deleted

## 2024-03-28 ENCOUNTER — Other Ambulatory Visit: Payer: Self-pay

## 2024-03-28 ENCOUNTER — Emergency Department (HOSPITAL_COMMUNITY)

## 2024-03-28 ENCOUNTER — Observation Stay (HOSPITAL_COMMUNITY)

## 2024-03-28 ENCOUNTER — Observation Stay (HOSPITAL_BASED_OUTPATIENT_CLINIC_OR_DEPARTMENT_OTHER)

## 2024-03-28 ENCOUNTER — Encounter (HOSPITAL_COMMUNITY): Payer: Self-pay | Admitting: Internal Medicine

## 2024-03-28 ENCOUNTER — Telehealth: Payer: Self-pay

## 2024-03-28 DIAGNOSIS — E46 Unspecified protein-calorie malnutrition: Secondary | ICD-10-CM

## 2024-03-28 DIAGNOSIS — D649 Anemia, unspecified: Secondary | ICD-10-CM | POA: Diagnosis not present

## 2024-03-28 DIAGNOSIS — I6389 Other cerebral infarction: Secondary | ICD-10-CM | POA: Diagnosis not present

## 2024-03-28 DIAGNOSIS — E1165 Type 2 diabetes mellitus with hyperglycemia: Secondary | ICD-10-CM | POA: Insufficient documentation

## 2024-03-28 DIAGNOSIS — E876 Hypokalemia: Secondary | ICD-10-CM | POA: Diagnosis not present

## 2024-03-28 DIAGNOSIS — R4182 Altered mental status, unspecified: Secondary | ICD-10-CM | POA: Insufficient documentation

## 2024-03-28 DIAGNOSIS — R410 Disorientation, unspecified: Secondary | ICD-10-CM | POA: Diagnosis not present

## 2024-03-28 DIAGNOSIS — E66811 Obesity, class 1: Secondary | ICD-10-CM

## 2024-03-28 DIAGNOSIS — E8809 Other disorders of plasma-protein metabolism, not elsewhere classified: Secondary | ICD-10-CM | POA: Insufficient documentation

## 2024-03-28 DIAGNOSIS — D75839 Thrombocytosis, unspecified: Secondary | ICD-10-CM | POA: Insufficient documentation

## 2024-03-28 DIAGNOSIS — I639 Cerebral infarction, unspecified: Secondary | ICD-10-CM | POA: Diagnosis not present

## 2024-03-28 DIAGNOSIS — F1910 Other psychoactive substance abuse, uncomplicated: Secondary | ICD-10-CM | POA: Insufficient documentation

## 2024-03-28 LAB — URINALYSIS, W/ REFLEX TO CULTURE (INFECTION SUSPECTED)
Bacteria, UA: NONE SEEN
Bilirubin Urine: NEGATIVE
Glucose, UA: NEGATIVE mg/dL
Hgb urine dipstick: NEGATIVE
Ketones, ur: 5 mg/dL — AB
Leukocytes,Ua: NEGATIVE
Nitrite: NEGATIVE
Protein, ur: 100 mg/dL — AB
Specific Gravity, Urine: 1.02 (ref 1.005–1.030)
pH: 7 (ref 5.0–8.0)

## 2024-03-28 LAB — BLOOD GAS, VENOUS
Acid-Base Excess: 7.5 mmol/L — ABNORMAL HIGH (ref 0.0–2.0)
Bicarbonate: 31.1 mmol/L — ABNORMAL HIGH (ref 20.0–28.0)
Drawn by: 1528
O2 Saturation: 96.4 %
Patient temperature: 36.8
pCO2, Ven: 39 mmHg — ABNORMAL LOW (ref 44–60)
pH, Ven: 7.51 — ABNORMAL HIGH (ref 7.25–7.43)
pO2, Ven: 66 mmHg — ABNORMAL HIGH (ref 32–45)

## 2024-03-28 LAB — ECHOCARDIOGRAM COMPLETE
AR max vel: 2.5 cm2
AV Area VTI: 2.79 cm2
AV Area mean vel: 2.42 cm2
AV Mean grad: 6.2 mmHg
AV Peak grad: 11.6 mmHg
Ao pk vel: 1.7 m/s
Area-P 1/2: 2.05 cm2
Calc EF: 39.1 %
Height: 72 in
S' Lateral: 3.8 cm
Single Plane A2C EF: 40.9 %
Single Plane A4C EF: 36.4 %
Weight: 3703.73 [oz_av]

## 2024-03-28 LAB — HEMOGLOBIN A1C
Hgb A1c MFr Bld: 8.5 % — ABNORMAL HIGH (ref 4.8–5.6)
Mean Plasma Glucose: 197.25 mg/dL

## 2024-03-28 LAB — MAGNESIUM: Magnesium: 1.7 mg/dL (ref 1.7–2.4)

## 2024-03-28 LAB — CBC WITH DIFFERENTIAL/PLATELET
Abs Immature Granulocytes: 0.03 10*3/uL (ref 0.00–0.07)
Basophils Absolute: 0 10*3/uL (ref 0.0–0.1)
Basophils Relative: 0 %
Eosinophils Absolute: 0.1 10*3/uL (ref 0.0–0.5)
Eosinophils Relative: 1 %
HCT: 35.1 % — ABNORMAL LOW (ref 39.0–52.0)
Hemoglobin: 11 g/dL — ABNORMAL LOW (ref 13.0–17.0)
Immature Granulocytes: 0 %
Lymphocytes Relative: 13 %
Lymphs Abs: 1.3 10*3/uL (ref 0.7–4.0)
MCH: 25.7 pg — ABNORMAL LOW (ref 26.0–34.0)
MCHC: 31.3 g/dL (ref 30.0–36.0)
MCV: 82 fL (ref 80.0–100.0)
Monocytes Absolute: 0.7 10*3/uL (ref 0.1–1.0)
Monocytes Relative: 7 %
Neutro Abs: 8.2 10*3/uL — ABNORMAL HIGH (ref 1.7–7.7)
Neutrophils Relative %: 79 %
Platelets: 474 10*3/uL — ABNORMAL HIGH (ref 150–400)
RBC: 4.28 MIL/uL (ref 4.22–5.81)
RDW: 15.2 % (ref 11.5–15.5)
WBC: 10.3 10*3/uL (ref 4.0–10.5)
nRBC: 0 % (ref 0.0–0.2)

## 2024-03-28 LAB — COMPREHENSIVE METABOLIC PANEL WITH GFR
ALT: 15 U/L (ref 0–44)
AST: 13 U/L — ABNORMAL LOW (ref 15–41)
Albumin: 3.4 g/dL — ABNORMAL LOW (ref 3.5–5.0)
Alkaline Phosphatase: 51 U/L (ref 38–126)
Anion gap: 10 (ref 5–15)
BUN: 11 mg/dL (ref 6–20)
CO2: 26 mmol/L (ref 22–32)
Calcium: 9.3 mg/dL (ref 8.9–10.3)
Chloride: 100 mmol/L (ref 98–111)
Creatinine, Ser: 0.69 mg/dL (ref 0.61–1.24)
GFR, Estimated: >60 mL/min (ref >60)
Glucose, Bld: 173 mg/dL — ABNORMAL HIGH (ref 70–99)
Potassium: 3.3 mmol/L — ABNORMAL LOW (ref 3.5–5.1)
Sodium: 136 mmol/L (ref 135–145)
Total Bilirubin: 0.6 mg/dL (ref 0.0–1.2)
Total Protein: 7.2 g/dL (ref 6.5–8.1)

## 2024-03-28 LAB — GLUCOSE, CAPILLARY
Glucose-Capillary: 152 mg/dL — ABNORMAL HIGH (ref 70–99)
Glucose-Capillary: 130 mg/dL — ABNORMAL HIGH (ref 70–99)

## 2024-03-28 LAB — LIPID PANEL
Cholesterol: 196 mg/dL (ref 0–200)
HDL: 43 mg/dL (ref >40)
LDL Cholesterol: 124 mg/dL — ABNORMAL HIGH (ref 0–99)
Total CHOL/HDL Ratio: 4.6 ratio
Triglycerides: 145 mg/dL (ref <150)
VLDL: 29 mg/dL (ref 0–40)

## 2024-03-28 LAB — RAPID URINE DRUG SCREEN, HOSP PERFORMED
Amphetamines: NOT DETECTED
Barbiturates: NOT DETECTED
Benzodiazepines: NOT DETECTED
Cocaine: POSITIVE — AB
Opiates: NOT DETECTED
Tetrahydrocannabinol: NOT DETECTED

## 2024-03-28 LAB — RESP PANEL BY RT-PCR (RSV, FLU A&B, COVID)  RVPGX2
Influenza A by PCR: NEGATIVE
Influenza B by PCR: NEGATIVE
Resp Syncytial Virus by PCR: NEGATIVE
SARS Coronavirus 2 by RT PCR: NEGATIVE

## 2024-03-28 LAB — CBG MONITORING, ED: Glucose-Capillary: 190 mg/dL — ABNORMAL HIGH (ref 70–99)

## 2024-03-28 LAB — TROPONIN I (HIGH SENSITIVITY)
Troponin I (High Sensitivity): 9 ng/L (ref <18)
Troponin I (High Sensitivity): 9 ng/L (ref <18)

## 2024-03-28 LAB — ETHANOL: Alcohol, Ethyl (B): <10 mg/dL (ref <10)

## 2024-03-28 LAB — PHOSPHORUS: Phosphorus: 3.3 mg/dL (ref 2.5–4.6)

## 2024-03-28 LAB — HIV ANTIBODY (ROUTINE TESTING W REFLEX): HIV Screen 4th Generation wRfx: NONREACTIVE

## 2024-03-28 MED ORDER — CLOPIDOGREL BISULFATE 75 MG PO TABS
300.0000 mg | ORAL_TABLET | Freq: Once | ORAL | Status: AC
  Administered 2024-03-28: 300 mg via ORAL
  Filled 2024-03-28: qty 4

## 2024-03-28 MED ORDER — IOHEXOL 350 MG/ML SOLN
75.0000 mL | Freq: Once | INTRAVENOUS | Status: AC | PRN
  Administered 2024-03-28: 75 mL via INTRAVENOUS

## 2024-03-28 MED ORDER — ATORVASTATIN CALCIUM 80 MG PO TABS: 80.0000 mg | ORAL_TABLET | Freq: Every day | ORAL | 2 refills | Status: DC

## 2024-03-28 MED ORDER — INSULIN ASPART 100 UNIT/ML IJ SOLN
0.0000 [IU] | Freq: Three times a day (TID) | INTRAMUSCULAR | Status: DC
  Administered 2024-03-28: 3 [IU] via SUBCUTANEOUS
  Administered 2024-03-28: 2 [IU] via SUBCUTANEOUS

## 2024-03-28 MED ORDER — GLUCERNA SHAKE PO LIQD: 237.0000 mL | Freq: Three times a day (TID) | ORAL | Status: DC

## 2024-03-28 MED ORDER — STROKE: EARLY STAGES OF RECOVERY BOOK: Freq: Once | Status: AC

## 2024-03-28 MED ORDER — SODIUM CHLORIDE 0.9 % IV SOLN: INTRAVENOUS | Status: DC

## 2024-03-28 MED ORDER — ATORVASTATIN CALCIUM 40 MG PO TABS
80.0000 mg | ORAL_TABLET | Freq: Every day | ORAL | Status: DC
  Administered 2024-03-28: 80 mg via ORAL
  Filled 2024-03-28: qty 2

## 2024-03-28 MED ORDER — POTASSIUM CHLORIDE CRYS ER 20 MEQ PO TBCR
40.0000 meq | EXTENDED_RELEASE_TABLET | Freq: Once | ORAL | Status: AC
  Administered 2024-03-28: 40 meq via ORAL
  Filled 2024-03-28: qty 2

## 2024-03-28 MED ORDER — ASPIRIN 325 MG PO TABS
325.0000 mg | ORAL_TABLET | Freq: Once | ORAL | Status: AC
  Administered 2024-03-28: 325 mg via ORAL
  Filled 2024-03-28: qty 1

## 2024-03-28 MED ORDER — ASPIRIN 81 MG PO TBEC: 81.0000 mg | DELAYED_RELEASE_TABLET | Freq: Every day | ORAL | 2 refills | Status: AC

## 2024-03-28 MED ORDER — CLOPIDOGREL BISULFATE 75 MG PO TABS: 75.0000 mg | ORAL_TABLET | Freq: Every day | ORAL | 0 refills | Status: AC

## 2024-03-28 NOTE — Telephone Encounter (Signed)
 30 day cardiac event monitor ordered to be shipped to pt's home

## 2024-03-28 NOTE — TOC CM/SW Note (Addendum)
 Transition of Care [facility]) - Inpatient Brief Assessment   Patient Details  Name: Nicholas Delacruz MRN: [id number] Date of Birth: 01-26-1966  [facility] CM/SW Contact:    Villa Herb, LCSWA Phone Number: 03/28/2024, 10:29 AM   Clinical Narrative: CSW updated that PT is recommending OP PT. CSW spoke with pt and he is agreeable to OP PT at [facility]. CSW made referral. TOC signing off.   Transition of Care Asessment: Insurance and Status: Insurance coverage has been reviewed Patient has primary care physician: Yes Home environment has been reviewed: From home Prior level of function:: Independent Prior/Current Home Services: No current home services Social Drivers of Health Review: SDOH reviewed no interventions necessary Readmission risk has been reviewed: Yes Transition of care needs: no transition of care needs at this time

## 2024-03-28 NOTE — Evaluation (Signed)
 Physical Therapy Evaluation Patient Details Name: Nicholas Delacruz MRN: [id number] DOB: 06-Dec-1966 Today's Date: 03/28/2024  History of Present Illness  Nicholas Delacruz is a 58 y.o. male with medical history significant of hypertension, T2DM, OSA on CPAP, GERD who presents to the emergency department from home via EMS due to 2-day onset of confusion.  Patient states that he feels like he has difficulty in being able to get his words out, despite knowing what he wants to say.  Patient states that he was taking gabapentin due to back pain for a month and that his symptoms started 2 days after stopping the medication.  He denies slurred speech, chest pain, shortness of breath, difficulty in ambulation.1. Acute finding is a Left hemisphere lacunar infarct involving the  left corona radiata and lentiform. No associated hemorrhage or mass  effect.  Clinical Impression  PT states that he is significantly better than yesterday but still feels off.  Noted slight expressive aphasia.  Pt able to ambulate on flat surface without assistive device but therapist notes slight unsteadiness although no loss of balance or fall.  Pt will benefit from OP PT to work on higher standing balance activities to return to previous functional level.         If plan is discharge home, recommend the following: Help with stairs or ramp for entrance   Can travel by private vehicle    yes    Equipment Recommendations None recommended by PT  Recommendations for Other Services   speech   Functional Status Assessment Patient has had a recent decline in their functional status and demonstrates the ability to make significant improvements in function in a reasonable and predictable amount of time.     Precautions / Restrictions Precautions Precautions: None Restrictions Weight Bearing Restrictions Per Provider Order: No      Mobility  Bed Mobility Overal bed mobility: Independent                   Transfers Overall transfer level: Independent                      Ambulation/Gait Ambulation/Gait assistance: Independent Gait Distance (Feet): 180 Feet Assistive device: None Gait Pattern/deviations: WFL(Within Functional Limits)       General Gait Details: slight unsteadiness noted but no loss of balance.    Balance  Sitting good  Standing fair +                                           Pertinent Vitals/Pain Pain Assessment Pain Assessment: No/denies pain    Home Living Family/patient expects to be discharged to:: Private residence Living Arrangements: Parent Available Help at Discharge: Family Type of Home: House Home Access: Ramped entrance       Home Layout: One level Home Equipment: Cane - single point;Shower seat;Grab bars - Chartered loss adjuster (2 wheels) Additional Comments: normally does not use an assistive device    Prior Function Prior Level of Function : Independent/Modified Independent             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     Extremity/Trunk Assessment   Upper Extremity Assessment Upper Extremity Assessment: Overall WFL for tasks assessed    Lower Extremity Assessment Lower Extremity Assessment: Overall WFL for tasks assessed    Cervical / Trunk Assessment Cervical /  Trunk Assessment: Normal  Communication   Communication Communication: Impaired Factors Affecting Communication: Difficulty expressing self    Cognition Arousal: Alert Behavior During Therapy: WFL for tasks assessed/performed                           PT - Cognition Comments: Pt states he is better than yesterday but still is not back to baseline Following commands: Intact       Cueing Cueing Techniques: Verbal cues            Assessment/Plan    PT Assessment Patient needs continued PT services  PT Problem List Decreased balance;Decreased  strength       PT Treatment Interventions Therapeutic activities;Balance training    PT Goals (Current goals can be found in the Care Plan section)       Frequency Min 2X/week    AM-PAC PT "6 Clicks" Mobility  Outcome Measure Help needed turning from your back to your side while in a flat bed without using bedrails?: None Help needed moving from lying on your back to sitting on the side of a flat bed without using bedrails?: None Help needed moving to and from a bed to a chair (including a wheelchair)?: None Help needed standing up from a chair using your arms (e.g., wheelchair or bedside chair)?: None Help needed to walk in hospital room?: None Help needed climbing 3-5 steps with a railing? : None 6 Click Score: 24    End of Session Equipment Utilized During Treatment: Gait belt Activity Tolerance: Patient tolerated treatment well Patient left: with call bell/phone within reach;in bed (Pt states he did not sleep at all last night and wants to take a nap) Nurse Communication: Mobility status PT Visit Diagnosis: Unsteadiness on feet (R26.81)    Time: 1015-1030 PT Time Calculation (min) (ACUTE ONLY): 15 min   Charges:   PT Evaluation $PT Eval Low Complexity: 1 Low   PT General Charges $$ ACUTE PT VISIT: 1 Visit        Virgina Organ, PT CLT [phone]  03/28/2024, 10:43 AM

## 2024-03-28 NOTE — Progress Notes (Signed)
*  PRELIMINARY RESULTS* Echocardiogram 2D Echocardiogram has been performed.  Stacey Drain 03/28/2024, 3:00 PM

## 2024-03-28 NOTE — Consult Note (Signed)
 I connected with  Nicholas Delacruz on 03/28/24 by a video enabled telemedicine application and verified that I am speaking with the correct person using two identifiers.   I discussed the limitations of evaluation and management by telemedicine. The patient expressed understanding and agreed to proceed.  Location of patient: [facility] [place] Location physician: [facility]  Neurology Consultation Reason for Consult: Stroke Referring Physician: Dr. Frankey Shown  CC: Confusion  History is obtained from: Patient, chart review  HPI: Nicholas Delacruz is a 58 y.o. male with past medical history of hypertension, type 2 diabetes, obstructive sleep apnea on CPAP who presented with confusion.  Blood pressure on arrival was 170/100 mmHg.  UDS positive for cocaine.  CT head was done which showed age-indeterminate lacunar infarct in right basal ganglia as well as remote infarcts in left corona radiata and left cerebellar hemisphere.  Therefore neurology was consulted.  Today patient states he feels less confused.  Denies any other concerns.  Last known normal: Unclear but about 2 days ago Event happened at home No tPA as outside window No thrombectomy as no large vessel occlusion mRS 0  ROS: All other systems reviewed and negative except as noted in the HPI.  Past Medical History:  Diagnosis Date   Arthritis    Diabetes mellitus without complication (HCC)    GERD (gastroesophageal reflux disease)    High blood pressure    Obstructive sleep apnea on CPAP    BIPAP   PONV (postoperative nausea and vomiting)    blood pressure dropped in recovery   Primary localized osteoarthritis of left knee 03/27/2018    Family History  Problem Relation Age of Onset   Alzheimer's disease Father    Arthritis Unknown     Social History:  reports that he has never smoked. He has never used smokeless tobacco. He reports current alcohol use. He reports that he does not use  drugs.   Medications Prior to Admission  Medication Sig Dispense Refill Last Dose/Taking   cephALEXin (KEFLEX) 500 MG capsule Take 1 capsule (500 mg total) by mouth 2 (two) times daily. 14 capsule 0    diltiazem (CARDIZEM CD) 300 MG 24 hr capsule Take 300 mg by mouth at bedtime.       labetalol (NORMODYNE) 200 MG tablet Take 200 mg by mouth 2 (two) times daily.       losartan-hydrochlorothiazide (HYZAAR) 100-25 MG tablet Take 1 tablet by mouth daily.       metFORMIN (GLUCOPHAGE) 500 MG tablet Take 500 mg by mouth 2 (two) times daily with a meal.      mupirocin ointment (BACTROBAN) 2 % Apply 1 Application topically 2 (two) times daily. 22 g 0    omeprazole (PRILOSEC) 40 MG capsule Take 40 mg by mouth daily.       ondansetron (ZOFRAN) 4 MG tablet Take 1 tablet (4 mg total) by mouth every 8 (eight) hours as needed for nausea or vomiting. 10 tablet 0    VOLTAREN 1 % GEL APPLY 4GM TOPICALLY 4 TIMES DAILY AS DIRECTED. 100 g 0    zolpidem (AMBIEN) 10 MG tablet Take 10 mg by mouth at bedtime.          Exam: Current vital signs: BP (!) 164/95 (BP Location: Left Arm)   Pulse 84   Temp (!) 97.5 F (36.4 C) (Oral)   Resp 17   Ht 6' (1.829 m)   Wt 105 kg   SpO2 97%   BMI  31.39 kg/m  Vital signs in last 24 hours: Temp:  [97.5 F (36.4 C)-98.6 F (37 C)] 97.5 F (36.4 C) (04/03 0429) Pulse Rate:  [50-84] 84 (04/03 0541) Resp:  [11-20] 17 (04/03 0541) BP: (122-174)/(70-100) 164/95 (04/03 0429) SpO2:  [90 %-99 %] 97 % (04/03 0541) FiO2 (%):  [21 %] 21 % (04/03 0541) Weight:  [105 kg-108.9 kg] 105 kg (04/03 0429)   Physical Exam  Constitutional: Appears well-developed and well-nourished.  Psych: Affect appropriate to situation Neuro: AOx3, no aphasia, no dysarthria, cranial nerves grossly intact, antigravity strength in all 4 extremities without drift, sensory intact to light touch, FTN intact bilaterally  NIHSS 0  I have reviewed labs in epic and the results pertinent to this  consultation are: CBC:  Recent Labs  Lab 03/28/24 0055  WBC 10.3  NEUTROABS 8.2*  HGB 11.0*  HCT 35.1*  MCV 82.0  PLT 474*    Basic Metabolic Panel:  Lab Results  Component Value Date   NA 136 03/28/2024   K 3.3 (L) 03/28/2024   CO2 26 03/28/2024   GLUCOSE 173 (H) 03/28/2024   BUN 11 03/28/2024   CREATININE 0.69 03/28/2024   CALCIUM 9.3 03/28/2024   GFRNONAA >60 03/28/2024   GFRAA >60 03/28/2018   Lipid Panel:  Lab Results  Component Value Date   LDLCALC 124 (H) 03/28/2024   HgbA1c:  Lab Results  Component Value Date   HGBA1C 6.8 (H) 03/21/2018   Urine Drug Screen:     Component Value Date/Time   LABOPIA NONE DETECTED 03/28/2024 0258   COCAINSCRNUR POSITIVE (A) 03/28/2024 0258   LABBENZ NONE DETECTED 03/28/2024 0258   AMPHETMU NONE DETECTED 03/28/2024 0258   THCU NONE DETECTED 03/28/2024 0258   LABBARB NONE DETECTED 03/28/2024 0258    Alcohol Level     Component Value Date/Time   ETH <10 03/28/2024 0055     I have reviewed the images obtained:   CT Head without contrast 03/27/2024: . Age-indeterminate lacunar infarct within the right basal ganglia. Moderate global parenchymal volume loss. 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 Mild progressive exophthalmos. No retro-orbital mass or fluid collection. The extraocular musculature is normal in appears unchanged examination.  MRI Brain without contrast: Final report pending.    ASSESSMENT/PLAN: 58 year old male with multiple medical comorbidities as noted in HPI presented with confusion.  MRI brain showed acute stroke.  Of note UDS also positive for cocaine.  Acute ischemic stroke -Etiology: Likely embolic  Recommendations: -Will get CTA head and neck to look for carotid and intra cranial stenosis. -If there is intracranial stenosis, recommend aspirin 81 mg and Plavix 75 mg daily for 3 months followed by monotherapy with aspirin 81 mg daily -If there is no  intracranial stenosis, recommend aspirin 81 mg and Plavix and 5 mg daily for 3 weeks followed by monotherapy with aspirin 81 mg daily -Recommend atorvastatin 80 mg daily for goal LDL less than 70 -TTE pending.  If negative recommend 30-day cardiac monitor to look for paroxysmal A-fib -PT/OT -Goal blood pressure: Normotension -Counseled patient about cocaine cessation -Follow-up with neurology in 3 months (order placed) -Discussed plan with patient and Dr. Sherryll Burger   Thank you for allowing [place] to participate in the care of this patient. If you have any further questions, please contact  me or neurohospitalist.   Lindie Spruce [facility]

## 2024-03-28 NOTE — Telephone Encounter (Signed)
-----   Message from Ellsworth Lennox sent at 03/28/2024  4:08 PM EDT ----- Regarding: 30-day monitor This patient needs a 30-day monitor for CVA per Dr. Sherryll Burger.   Thanks,  [place]

## 2024-03-28 NOTE — Plan of Care (Signed)
   Problem: Education: Goal: Knowledge of General Education information will improve Description Including pain rating scale, medication(s)/side effects and non-pharmacologic comfort measures Outcome: Progressing   Problem: Health Behavior/Discharge Planning: Goal: Ability to manage health-related needs will improve Outcome: Progressing

## 2024-03-28 NOTE — ED Provider Notes (Signed)
 [place] [facility]MENT AT [facility] Provider Note   CSN: [id number] Arrival date & time: 03/27/24  2340     History  Chief Complaint  Patient presents with   Can't Focus    Nicholas Delacruz is a 58 y.o. male with PMH as listed below who was [facility] from home. CC of confusion. Per EMS patient has been confused since approximately 11am yesterday. Said "the numbers on facebook" but would not elaborate.  On arrival to the ED he is alert and oriented x 4 and able to give a clear history.  Accompanied by his son. When asked during triage, he said "he just can't focus." He denies any new numbness/tingling, asymmetric weakness, slurred speech, trouble swallowing, trouble walking, falls, head trauma, lightheadedness, chest pain, shortness of breath, abdominal pain, nausea and diarrhea constipation, urinary symptoms.  States that 2 days ago around when the symptoms started he had stopped his gabapentin after being on it for a month for diabetic neuropathy.  He denies alcohol or drug use.  Patient lives at home.  Past Medical History:  Diagnosis Date   Arthritis    Diabetes mellitus without complication (HCC)    GERD (gastroesophageal reflux disease)    High blood pressure    Obstructive sleep apnea on CPAP    BIPAP   PONV (postoperative nausea and vomiting)    blood pressure dropped in recovery   Primary localized osteoarthritis of left knee 03/27/2018       Home Medications Prior to Admission medications   Medication Sig Start Date End Date Taking? Authorizing Provider  cephALEXin (KEFLEX) 500 MG capsule Take 1 capsule (500 mg total) by mouth 2 (two) times daily. 08/04/23   Particia Nearing, PA-C  diltiazem (CARDIZEM CD) 300 MG 24 hr capsule Take 300 mg by mouth at bedtime.  05/25/16   [provider]  labetalol (NORMODYNE) 200 MG tablet Take 200 mg by mouth 2 (two) times daily.  06/28/11   [provider]  losartan-hydrochlorothiazide (HYZAAR) 100-25  MG tablet Take 1 tablet by mouth daily.  09/04/17   [provider]  metFORMIN (GLUCOPHAGE) 500 MG tablet Take 500 mg by mouth 2 (two) times daily with a meal.    [provider]  mupirocin ointment (BACTROBAN) 2 % Apply 1 Application topically 2 (two) times daily. 08/04/23   Particia Nearing, PA-C  omeprazole (PRILOSEC) 40 MG capsule Take 40 mg by mouth daily.  09/29/17   [provider]  ondansetron (ZOFRAN) 4 MG tablet Take 1 tablet (4 mg total) by mouth every 8 (eight) hours as needed for nausea or vomiting. 03/27/18   Teryl Lucy, MD  VOLTAREN 1 % GEL APPLY 4GM TOPICALLY 4 TIMES DAILY AS DIRECTED. 12/06/19   Vickki Hearing, MD  zolpidem (AMBIEN) 10 MG tablet Take 10 mg by mouth at bedtime.     [provider]      Allergies    Levofloxacin    Review of Systems   Review of Systems A 10 point review of systems was performed and is negative unless otherwise reported in HPI.  Physical Exam Updated Vital Signs BP (!) 164/95 (BP Location: Left Arm)   Pulse (!) 50   Temp (!) 97.5 F (36.4 C) (Oral)   Resp 18   Ht 6' (1.829 m)   Wt 105 kg   SpO2 96%   BMI 31.39 kg/m  Physical Exam General: Normal appearing male, lying in bed.  HEENT: PERRLA, EOMI, no  nystagmus, Sclera anicteric, MMM, trachea midline. Symmetric face. No facial droop. Tongue protrudes midline.  Cardiology: RRR, no murmurs/rubs/gallops. BL radial and DP pulses equal bilaterally.  Resp: Normal respiratory rate and effort. CTAB, no wheezes, rhonchi, crackles.  Abd: Soft, non-tender, non-distended. No rebound tenderness or guarding.  GU: Deferred. MSK: No peripheral edema or signs of trauma. Extremities without deformity or TTP.  Skin: warm, dry.  Neuro: A&Ox4, CNs II-XII grossly intact. 5/5 strength all extremities. Sensation grossly intact. Normal speech. Intermittently slow to respond to questions.  Psych: Normal mood and affect.   1a  Level of consciousness: 0=alert;  keenly responsive  1b. LOC questions:  0=Performs both tasks correctly  1c. LOC commands: 0=Performs both tasks correctly  2.  Best Gaze: 0=normal  3.  Visual: 0=No visual loss  4. Facial Palsy: 0=Normal symmetric movement  5a.  Motor left arm: 0=No drift, limb holds 90 (or 45) degrees for full 10 seconds  5b.  Motor right arm: 0=No drift, limb holds 90 (or 45) degrees for full 10 seconds  6a. motor left leg: 0=No drift, limb holds 90 (or 45) degrees for full 10 seconds  6b  Motor right leg:  0=No drift, limb holds 90 (or 45) degrees for full 10 seconds  7. Limb Ataxia: 0=Absent  8.  Sensory: 0=Normal; no sensory loss  9. Best Language:  0=No aphasia, normal  10. Dysarthria: 0=Normal  11. Extinction and Inattention: 0=No abnormality   Total:   0        ED Results / Procedures / Treatments   Labs (all labs ordered are listed, but only abnormal results are displayed) Labs Reviewed  CBC WITH DIFFERENTIAL/PLATELET - Abnormal; Notable for the following components:      Result Value   Hemoglobin 11.0 (*)    HCT 35.1 (*)    MCH 25.7 (*)    Platelets 474 (*)    Neutro Abs 8.2 (*)    All other components within normal limits  COMPREHENSIVE METABOLIC PANEL WITH GFR - Abnormal; Notable for the following components:   Potassium 3.3 (*)    Glucose, Bld 173 (*)    Albumin 3.4 (*)    AST 13 (*)    All other components within normal limits  URINALYSIS, W/ REFLEX TO CULTURE (INFECTION SUSPECTED) - Abnormal; Notable for the following components:   Ketones, ur 5 (*)    Protein, ur 100 (*)    All other components within normal limits  RAPID URINE DRUG SCREEN, HOSP PERFORMED - Abnormal; Notable for the following components:   Cocaine POSITIVE (*)    All other components within normal limits  BLOOD GAS, VENOUS - Abnormal; Notable for the following components:   pH, Ven 7.51 (*)    pCO2, Ven 39 (*)    pO2, Ven 66 (*)    Bicarbonate 31.1 (*)    Acid-Base Excess 7.5 (*)    All other  components within normal limits  CBG MONITORING, ED - Abnormal; Notable for the following components:   Glucose-Capillary 190 (*)    All other components within normal limits  RESP PANEL BY RT-PCR (RSV, FLU A&B, COVID)  RVPGX2  MAGNESIUM  ETHANOL  TROPONIN I (HIGH SENSITIVITY)  TROPONIN I (HIGH SENSITIVITY)    EKG EKG Interpretation Date/Time:  Wednesday March 27 2024 23:50:14 EDT Ventricular Rate:  65 PR Interval:  141 QRS Duration:  101 QT Interval:  422 QTC Calculation: 439 R Axis:   52  Text Interpretation: Sinus rhythm Abnormal R-wave  progression, early transition LVH with secondary repolarization abnormality Confirmed by Vivi Barrack [phone]) on 03/28/2024 5:05:33 AM  Radiology CT Head Wo Contrast Result Date: 03/28/2024 CLINICAL DATA:  Altered mental status EXAM: CT HEAD WITHOUT CONTRAST TECHNIQUE: Contiguous axial images were obtained from the base of the skull through the vertex without intravenous contrast. RADIATION DOSE REDUCTION: This exam was performed according to the departmental dose-optimization program which includes automated exposure control, adjustment of the mA and/or kV according to patient size and/or use of iterative reconstruction technique. COMPARISON:  11/23/2010 FINDINGS: Brain: Moderate global parenchymal volume loss. Age-indeterminate lacunar infarct within the right basal ganglia. 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 No abnormal mass effect or midline shift. No abnormal intra or extra-axial mass lesion or fluid collection. Ventricular size is. Cerebellum is otherwise unremarkable. Vascular: No hyperdense vessel or unexpected calcification. Skull: Normal. Negative for fracture or focal lesion. Sinuses/Orbits: There is mild progressive exophthalmos. No retro-orbital mass or fluid collection. The extraocular musculature is normal in appears unchanged examination. Extensive postsurgical changes are seen visualized paranasal  sinuses. There is opacification of several air cells as well as the residual left and right maxillary sinus. Bony thickening and sclerosis involving the anterior and lateral wall of the residual maxillary sinus in keeping with changes sinusitis. Other: Mastoid air cells and middle ear cavities are clear. IMPRESSION: 1. Age-indeterminate lacunar infarct within the right basal ganglia. If indicated, this could be confirmed with MRI examination. 2. Moderate global parenchymal volume loss. 3. 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 4. Mild progressive exophthalmos. No retro-orbital mass or fluid collection. The extraocular musculature is normal in appears unchanged examination. 5. Extensive postsurgical changes within the visualized paranasal sinuses. Moderate diffuse paranasal sinus disease with evidence of sinusitis involving residual left maxillary sinus. Electronically Signed   By: Helyn Numbers M.D.   On: 03/28/2024 00:29    Procedures Procedures    Medications Ordered in ED Medications - No data to display  ED Course/ Medical Decision Making/ A&P                          Medical Decision Making Amount and/or Complexity of Data Reviewed Labs: ordered. Radiology: ordered.  Risk Decision regarding hospitalization.    This patient presents to the ED for concern of AMS, this involves an extensive number of treatment options, and is a complaint that carries with it a high risk of complications and morbidity.  I considered the following differential and admission for this acute, potentially life threatening condition.   MDM:    Ddx of acute altered mental status or encephalopathy considered but not limited to: -Intracranial abnormalities  - no reported head trauma. CTH performed which demonstrates age-indeterminate infarct in basal ganglia an dremote infarcts as well. No ICH or hydrocephalus.  Patient will likely need stroke workup and MRI  brain. -Infection such as UTI, PNA - no focal infectious symptoms or f/c. No signs of infection. -Toxic ingestion - consider side effects of gabapentin, though per patient's history it seems as though the sxs started after stopping it? -No notable electrolyte abnormalities or hyper/hypoglycemia -No hypercarbia or hypoxia -EKG and troponin w/o e/o ACS or arrhythmia -UDS is positive for cocaine, consider possible substance use as cause   Clinical Course as of 03/28/24 0508  Thu Mar 28, 2024  0249 CT head demonstrates possible age-indeterminate infarct in the basal ganglia.  As well as remote infarcts.  This could be the etiology of patient's symptoms however does not necessarily localize any has no other symptoms.  Labs including CMP, VBG, troponin, EtOH, respiratory panel, glucose, magnesium, and CBC do not reveal any alternate etiology. [HN]    Clinical Course User Index [HN] Loetta Rough, MD    Labs: I Ordered, and personally interpreted labs.  The pertinent results include: Those listed above  Imaging Studies ordered: I ordered imaging studies including CT head I independently visualized and interpreted imaging. I agree with the radiologist interpretation  Additional history obtained from chart review, son at bedside.    Cardiac Monitoring: The patient was maintained on a cardiac monitor.  I personally viewed and interpreted the cardiac monitored which showed an underlying rhythm of: Normal sinus rhythm, sinus bradycardia  Reevaluation: After the interventions noted above, I reevaluated the patient and found that they have :stayed the same  Social Determinants of Health:  lives independently  Disposition: Admit to hospitalist  Co morbidities that complicate the patient evaluation  Past Medical History:  Diagnosis Date   Arthritis    Diabetes mellitus without complication (HCC)    GERD (gastroesophageal reflux disease)    High blood pressure    Obstructive sleep  apnea on CPAP    BIPAP   PONV (postoperative nausea and vomiting)    blood pressure dropped in recovery   Primary localized osteoarthritis of left knee 03/27/2018     Medicines No orders of the defined types were placed in this encounter.   I have reviewed the patients home medicines and have made adjustments as needed  Problem List / ED Course: Problem List Items Addressed This Visit       Other   * (Principal) Altered mental status - Primary   Other Visit Diagnoses       Basal ganglia infarction [facility])                       This note was created using dictation software, which may contain spelling or grammatical errors.    Loetta Rough, MD 03/28/24 [phone]

## 2024-03-28 NOTE — ED Notes (Signed)
 Patient transported to CT

## 2024-03-28 NOTE — Discharge Summary (Signed)
 Physician Discharge Summary  Nicholas Delacruz HYQ:[id number] DOB: Sep 14, 1966 DOA: 03/27/2024  PCP: Assunta Found, MD  Admit date: 03/27/2024  Discharge date: 03/28/2024  Admitted From: Home  Disposition: Home  Recommendations for Outpatient Follow-up:  Follow up with PCP in 1-2 weeks Follow-up with cardiology with referral sent regarding cardiomyopathy 30-day cardiac monitor to be sent home for evaluation of atrial fibrillation Continue aspirin and Plavix as prescribed for 3 weeks and then aspirin only thereafter Continue on atorvastatin 80 mg daily Continue other home medications as prior Follow-up with neurology in 3 months with referral sent Advised on cocaine cessation  Home Health: None, outpatient physical therapy  Equipment/Devices: None  Discharge Condition:Stable  CODE STATUS: Full  Diet recommendation: Heart Healthy/carb modified  Brief/Interim Summary: Nicholas Delacruz is a 58 y.o. male with medical history significant of hypertension, T2DM, OSA on CPAP, GERD who presents to the emergency department from home via EMS due to 2-day onset of confusion.  Patient states that he feels like he has difficulty in being able to get his words out, despite knowing what he wants to say.  Patient was admitted for acute CVA confirmed on imaging.  He was seen by neurology and had CTA head and neck with no LVO noted.  He has had 2D echocardiogram with findings of some cardiomyopathy with LVEF 40-45% and some diastolic dysfunction with global hypokinesis.  He is noted to have history of cocaine use and this is likely related.  He will need close follow-up with cardiology outpatient and will remain on aspirin and Plavix as prescribed as well as atorvastatin and follow-up with neurology in the next 3 months with referral sent.  He will also go home with cardiac monitor.  He was seen by PT with recommendations for outpatient PT services.  No other acute events or concerns noted.  Discharge  Diagnoses:  Principal Problem:   Confusion Active Problems:   Normocytic anemia   Thrombocytosis   Hypokalemia   Hypoalbuminemia due to protein-calorie malnutrition (HCC)   Type 2 diabetes mellitus with hyperglycemia (HCC)   Drug abuse (HCC)  Principal discharge diagnosis: Acute ischemic CVA.  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  Discharge Instructions  Discharge Instructions     Ambulatory referral to Cardiology   Complete by: As directed    Ambulatory referral to Neurology   Complete by: As directed    An appointment is requested in approximately: 8-12 weeks   Ambulatory referral to Physical Therapy   Complete by: As directed    Diet - low sodium heart healthy   Complete by: As directed    Increase activity slowly   Complete by: As directed       Allergies as of 03/28/2024       Reactions   Levofloxacin Anaphylaxis        Medication List     STOP taking these medications    cephALEXin 500 MG capsule Commonly known as: KEFLEX   doxycycline 100 MG capsule Commonly known as: VIBRAMYCIN   losartan 100 MG tablet Commonly known as: COZAAR   methylPREDNISolone 4 MG Tbpk tablet Commonly known as: MEDROL DOSEPAK   ondansetron 4 MG disintegrating tablet Commonly known as: ZOFRAN-ODT       TAKE these medications    Ambien 10 MG tablet Generic drug: zolpidem Take 10 mg by mouth at bedtime.   aspirin EC 81 MG tablet Take 1 tablet (81 mg total) by mouth daily. Swallow whole.   atorvastatin 80 MG tablet Commonly known as:  LIPITOR Take 1 tablet (80 mg total) by mouth daily. Start taking on: March 29, 2024   clopidogrel 75 MG tablet Commonly known as: Plavix Take 1 tablet (75 mg total) by mouth daily for 21 days.   diclofenac 50 MG tablet Commonly known as: CATAFLAM Take 50 mg by mouth 3 (three) times daily as needed.   diclofenac 75 MG EC tablet Commonly known as: VOLTAREN Take 75 mg by mouth 2 (two) times daily as needed.   diltiazem 300 MG  24 hr capsule Commonly known as: CARDIZEM CD Take 300 mg by mouth at bedtime.   escitalopram 20 MG tablet Commonly known as: LEXAPRO Take 20 mg by mouth daily.   fluticasone 50 MCG/ACT nasal spray Commonly known as: FLONASE Place 2 sprays into both nostrils daily.   gabapentin 300 MG capsule Commonly known as: NEURONTIN Take 300 mg by mouth 3 (three) times daily.   HYDROcodone-acetaminophen 10-325 MG tablet Commonly known as: NORCO Take 1 tablet by mouth every 4 (four) hours.   ibuprofen 800 MG tablet Commonly known as: ADVIL Take 800 mg by mouth 3 (three) times daily as needed.   labetalol 300 MG tablet Commonly known as: NORMODYNE Take 300 mg by mouth 2 (two) times daily.   losartan-hydrochlorothiazide 100-25 MG tablet Commonly known as: HYZAAR Take 1 tablet by mouth daily.   metFORMIN 1000 MG tablet Commonly known as: GLUCOPHAGE Take 1,000 mg by mouth 2 (two) times daily.   mupirocin ointment 2 % Commonly known as: BACTROBAN Apply 1 Application topically 2 (two) times daily.   omeprazole 40 MG capsule Commonly known as: PRILOSEC Take 40 mg by mouth daily.   ondansetron 4 MG tablet Commonly known as: Zofran Take 1 tablet (4 mg total) by mouth every 8 (eight) hours as needed for nausea or vomiting.   triamcinolone cream 0.1 % Commonly known as: KENALOG Apply 1 Application topically 2 (two) times daily.        Follow-up Information     Assunta Found, MD. Schedule an appointment as soon as possible for a visit in 1 week(s).   Specialty: Family Medicine Contact information: [address] [place] [address] [phone]         [facility] [place]. Go to.   Specialty: Cardiology Contact information: [address] [place] [address] [phone]        [facility]. Go to.   Specialty: Neurology Contact information: [address] [place]  [address] [phone]               Allergies  Allergen Reactions   Levofloxacin Anaphylaxis    Consultations: Neurology   Procedures/Studies: ECHOCARDIOGRAM COMPLETE Result Date: 03/28/2024    ECHOCARDIOGRAM REPORT   Patient Name:   Nicholas Delacruz Date of Exam: 03/28/2024 Medical Rec #:  [id number]        Height:       72.0 in Accession #:    [id number]       Weight:       231.5 lb Date of Birth:  [birth year]         BSA:          2.267 m Patient Age:    57 years         BP:           162/92 mmHg Patient Gender: M                HR:  58 bpm. [facility] Procedure: 2D Echo, Cardiac Doppler and Color Doppler (Both Spectral and Color            Flow Doppler were utilized during procedure). Indications:    Stroke l63.9  History:        Patient has no prior history of Echocardiogram examinations.                 Risk Factors:Hypertension and Diabetes. Obstructive sleep apnea                 on CPAP (From Hx), Hx of drug abuse & Altered mental status.  Sonographer:    Celesta Gentile RCS Referring Phys: [id number] Jinnifer Montejano D [facility] IMPRESSIONS  1. Left ventricular ejection fraction, by estimation, is 40 to 45%. The left ventricle has mildly decreased function. The left ventricle demonstrates global hypokinesis. There is moderate left ventricular hypertrophy. Left ventricular diastolic parameters are consistent with Grade I diastolic dysfunction (impaired relaxation).  2. Right ventricular systolic function is normal. The right ventricular size is normal.  3. Left atrial size was moderately dilated.  4. Right atrial size was mildly dilated.  5. The mitral valve is normal in structure. Trivial mitral valve regurgitation. No evidence of mitral stenosis.  6. The aortic valve was not well visualized. Aortic valve regurgitation is not visualized. No aortic stenosis is present.  7. The inferior vena cava is normal in size with greater than 50% respiratory variability, suggesting right atrial  pressure of 3 mmHg. FINDINGS  Left Ventricle: Left ventricular ejection fraction, by estimation, is 40 to 45%. The left ventricle has mildly decreased function. The left ventricle demonstrates global hypokinesis. The left ventricular internal cavity size was normal in size. There is  moderate left ventricular hypertrophy. Left ventricular diastolic parameters are consistent with Grade I diastolic dysfunction (impaired relaxation). Normal left ventricular filling pressure. Right Ventricle: The right ventricular size is normal. Right vetricular wall thickness was not well visualized. Right ventricular systolic function is normal. Left Atrium: Left atrial size was moderately dilated. Right Atrium: Right atrial size was mildly dilated. Pericardium: There is no evidence of pericardial effusion. Mitral Valve: The mitral valve is normal in structure. Trivial mitral valve regurgitation. No evidence of mitral valve stenosis. Tricuspid Valve: The tricuspid valve is normal in structure. Tricuspid valve regurgitation is not demonstrated. No evidence of tricuspid stenosis. Aortic Valve: The aortic valve was not well visualized. Aortic valve regurgitation is not visualized. No aortic stenosis is present. Aortic valve mean gradient measures 6.2 mmHg. Aortic valve peak gradient measures 11.6 mmHg. Aortic valve area, by VTI measures 2.79 cm. Pulmonic Valve: The pulmonic valve was not well visualized. Pulmonic valve regurgitation is not visualized. No evidence of pulmonic stenosis. Aorta: The aortic root was not well visualized and the ascending aorta was not well visualized. Venous: The inferior vena cava is normal in size with greater than 50% respiratory variability, suggesting right atrial pressure of 3 mmHg. IAS/Shunts: No atrial level shunt detected by color flow Doppler.  LEFT VENTRICLE PLAX 2D LVIDd:         4.60 cm      Diastology LVIDs:         3.80 cm      LV e' medial:    5.55 cm/s LV PW:         1.40 cm      LV E/e'  medial:  14.1 LV IVS:        1.40 cm  LV e' lateral:   9.46 cm/s LVOT diam:     2.10 cm      LV E/e' lateral: 8.3 LV SV:         87 LV SV Index:   38 LVOT Area:     3.46 cm  LV Volumes (MOD) LV vol d, MOD A2C: 192.5 ml LV vol d, MOD A4C: 165.0 ml LV vol s, MOD A2C: 113.8 ml LV vol s, MOD A4C: 105.0 ml LV SV MOD A2C:     78.7 ml LV SV MOD A4C:     165.0 ml LV SV MOD BP:      76.7 ml RIGHT VENTRICLE RV S prime:     15.10 cm/s TAPSE (M-mode): 2.3 cm LEFT ATRIUM             Index        RIGHT ATRIUM           Index LA diam:        4.00 cm 1.76 cm/m   RA Area:     23.50 cm LA Vol (A2C):   95.4 ml 42.08 ml/m  RA Volume:   79.80 ml  35.20 ml/m LA Vol (A4C):   95.7 ml 42.21 ml/m LA Biplane Vol: 98.3 ml 43.36 ml/m  AORTIC VALVE AV Area (Vmax):    2.50 cm AV Area (Vmean):   2.42 cm AV Area (VTI):     2.79 cm AV Vmax:           170.14 cm/s AV Vmean:          116.922 cm/s AV VTI:            0.313 m AV Peak Grad:      11.6 mmHg AV Mean Grad:      6.2 mmHg LVOT Vmax:         123.00 cm/s LVOT Vmean:        81.700 cm/s LVOT VTI:          0.252 m LVOT/AV VTI ratio: 0.80 MITRAL VALVE MV Area (PHT): 2.05 cm    SHUNTS MV Decel Time: 370 msec    Systemic VTI:  0.25 m MV E velocity: 78.40 cm/s  Systemic Diam: 2.10 cm MV A velocity: 92.50 cm/s MV E/A ratio:  0.85 Dina Rich MD Electronically signed by Dina Rich MD Signature Date/Time: 03/28/2024/3:20:51 PM    Final    US Carotid Bilateral Result Date: 03/28/2024 CLINICAL DATA:  58 year old male with confusion EXAM: BILATERAL CAROTID DUPLEX ULTRASOUND TECHNIQUE: Wallace Cullens scale imaging, color Doppler and duplex ultrasound were performed of bilateral carotid and vertebral arteries in the neck. COMPARISON:  None Available. FINDINGS: Criteria: Quantification of carotid stenosis is based on velocity parameters that correlate the residual internal carotid diameter with NASCET-based stenosis levels, using the diameter of the distal internal carotid lumen as the denominator  for stenosis measurement. The following velocity measurements were obtained: RIGHT ICA:  Systolic 75 cm/sec, Diastolic 26 cm/sec CCA:  79 cm/sec SYSTOLIC ICA/CCA RATIO:  1.0 ECA:  142 cm/sec LEFT ICA:  Systolic 73 cm/sec, Diastolic 19 cm/sec CCA:  87 cm/sec SYSTOLIC ICA/CCA RATIO:  0.8 ECA:  123 cm/sec Right Brachial SBP: Not acquired Left Brachial SBP: Not acquired RIGHT CAROTID ARTERY: No significant calcified disease of the right common carotid artery. Intermediate waveform maintained. Heterogeneous plaque without significant calcifications at the right carotid bifurcation. Low resistance waveform of the right ICA. No significant tortuosity. RIGHT VERTEBRAL ARTERY: Antegrade flow with low resistance waveform. LEFT CAROTID  ARTERY: No significant calcified disease of the left common carotid artery. Intermediate waveform maintained. Heterogeneous plaque at the left carotid bifurcation without significant calcifications. Low resistance waveform of the left ICA. LEFT VERTEBRAL ARTERY:  Antegrade flow with low resistance waveform. IMPRESSION: Color duplex indicates heterogeneous plaque, with no hemodynamically significant stenosis by duplex criteria in the extracranial cerebrovascular circulation. Signed, Yvone Neu. Miachel Roux, RPVI [facility] Electronically Signed   By: Gilmer Mor D.O.   On: 03/28/2024 14:09   CT ANGIO HEAD NECK W WO CM Result Date: 03/28/2024 CLINICAL DATA:  58 year old male neurologic deficit, altered mental status. Acute left corona radiata and lentiform lacunar infarct on MRI. EXAM: CT ANGIOGRAPHY HEAD AND NECK TECHNIQUE: Multidetector CT imaging of the head and neck was performed using the standard protocol during bolus administration of intravenous contrast. Multiplanar CT image reconstructions and MIPs were obtained to evaluate the vascular anatomy. Carotid stenosis measurements (when applicable) are obtained utilizing NASCET  criteria, using the distal internal carotid diameter as the denominator. RADIATION DOSE REDUCTION: This exam was performed according to the departmental dose-optimization program which includes automated exposure control, adjustment of the mA and/or kV according to patient size and/or use of iterative reconstruction technique. CONTRAST:  75mL OMNIPAQUE IOHEXOL 350 MG/ML SOLN COMPARISON:  Brain MRI and head CT earlier today. FINDINGS: CTA NECK Skeleton: Cervical spine degeneration superimposed on degenerative appearing ankylosis of C3-C4. Bulky facet arthropathy. Diffuse idiopathic skeletal hyperostosis (DISH). Bulky upper thoracic spine degeneration also. No acute osseous abnormality identified. Upper chest: Visible lungs appear hyperinflated. Negative visible mediastinum. Other neck: Neck soft tissue spaces are within normal limits. Aortic arch: Calcified aortic atherosclerosis.  3 vessel arch. Right carotid system: Patent right CCA with soft and calcified plaque but no significant stenosis. Bulky calcified plaque at the right ICA origin, soft plaque posteriorly and at the bulb. Tortuous right ICA but no significant stenosis to the skull base. Left carotid system: Similar plaque and tortuosity with no significant stenosis to the skull base. Vertebral arteries: Minimal plaque at the right subclavian and right vertebral artery origins. Right vertebral artery appears dominant and patent to the skull base with no significant stenosis. Proximal left subclavian calcified plaque without stenosis. Left vertebral artery origin is normal. Non dominant left vertebral artery remains diminutive but patent to the skull base with no stenosis identified. CTA HEAD Posterior circulation: Dominant right vertebral V4 segment with moderate to severe calcified plaque (series 5, image 30), moderate stenosis. Distal left vertebral artery is normal to the left PICA which is patent. Small left V4 contribution to the vertebrobasilar  junction. Distal right vertebral, vertebrobasilar junction, basilar arteries are patent without stenosis. Patent SCA and PCA origins. Right posterior communicating artery is present, the left is diminutive or absent. Bilateral PCAs remain patent but there is severe stenosis bilaterally. Severe stenosis of the left P1 P2 junction and right P2 segment on series 9, image 22. Additional moderate stenosis of the distal left P2 just proximal to the vessel bifurcation. Anterior circulation: Both ICA siphons are patent. Left ICA petrous segment soft and calcified plaque such as series 8, image 128. Calcified plaque continues through the cavernous segment. There is up to moderate left ICA siphon stenosis. Contralateral right ICA more mild to moderate calcified plaque. Only mild right siphon stenosis. Normal right posterior communicating artery origin. Patent carotid termini, MCA and ACA origins. Tortuous A1 segments, the right is dominant. Normal anterior communicating artery. Bilateral ACA branches are within normal limits.  Right MCA M1 segment and bifurcation are patent without stenosis. Right MCA branches are within normal limits. Left MCA M1 segment and bifurcation are patent without stenosis. Left MCA branches are within normal limits. Venous sinuses: Patent. Anatomic variants: Dominant right and diminutive left vertebral arteries. Dominant right ACA A1 segment. Review of the MIP images confirms the above findings IMPRESSION: 1. Negative for large vessel occlusion. But Positive for extensive atherosclerosis in the Head and Neck with significant stenoses: - Moderate stenosis Left ICA siphon. - Moderate stenosis of the dominant Right Vertebral V4 segment. - up to Severe stenosis of bilateral PCA P1/P2 segments. 2.  Aortic Atherosclerosis (ICD10-I70.0). 3. Bulky cervical and upper thoracic spinal degeneration. Electronically Signed   By: Odessa Fleming M.D.   On: 03/28/2024 09:51   MR BRAIN WO CONTRAST Result Date:  03/28/2024 CLINICAL DATA:  58 year old male neurologic deficit, altered mental status. EXAM: MRI HEAD WITHOUT CONTRAST TECHNIQUE: Multiplanar, multiecho pulse sequences of the brain and surrounding structures were obtained without intravenous contrast. COMPARISON:  Head CT 0018 hours today. FINDINGS: Brain: Confluent restricted diffusion in the left corona radiata tracking into the lentiform, an area of about 2.4 cm on series 7, image 22 (series 6, image 35). No other restricted diffusion. Chronic infarct with encephalomalacia and hemosiderin in the left cerebellum PICA territory. Right anterior external capsule white matter T2 FLAIR hyperintensity is chronic and corresponds to the CT finding earlier today. No midline shift, mass effect, evidence of mass lesion, ventriculomegaly, extra-axial collection or acute intracranial hemorrhage. Cervicomedullary junction and pituitary are within normal limits. No other chronic cerebral blood products. No cerebral cortical encephalomalacia is identified. Vascular: Major intracranial vascular flow voids are preserved. Skull and upper cervical spine: Mildly motion degraded visible cervical spine with C3-C4 ankylosis suspected. Background bone marrow signal within normal limits. Sinuses/Orbits: Postoperative changes to the globes. Scattered paranasal sinus mucosal thickening and mucous retention cysts. Other: Visible internal auditory structures appear normal. Well aerated mastoids. IMPRESSION: 1. Acute finding is a Left hemisphere lacunar infarct involving the left corona radiata and lentiform. No associated hemorrhage or mass effect. 2. Otherwise chronic white matter disease and a chronic left cerebellum PICA territory infarct. Electronically Signed   By: Odessa Fleming M.D.   On: 03/28/2024 09:43   CT Head Wo Contrast Result Date: 03/28/2024 CLINICAL DATA:  Altered mental status EXAM: CT HEAD WITHOUT CONTRAST TECHNIQUE: Contiguous axial images were obtained from the base of the  skull through the vertex without intravenous contrast. RADIATION DOSE REDUCTION: This exam was performed according to the departmental dose-optimization program which includes automated exposure control, adjustment of the mA and/or kV according to patient size and/or use of iterative reconstruction technique. COMPARISON:  11/23/2010 FINDINGS: Brain: Moderate global parenchymal volume loss. Age-indeterminate lacunar infarct within the right basal ganglia. 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 No abnormal mass effect or midline shift. No abnormal intra or extra-axial mass lesion or fluid collection. Ventricular size is. Cerebellum is otherwise unremarkable. Vascular: No hyperdense vessel or unexpected calcification. Skull: Normal. Negative for fracture or focal lesion. Sinuses/Orbits: There is mild progressive exophthalmos. No retro-orbital mass or fluid collection. The extraocular musculature is normal in appears unchanged examination. Extensive postsurgical changes are seen visualized paranasal sinuses. There is opacification of several air cells as well as the residual left and right maxillary sinus. Bony thickening and sclerosis involving the anterior and lateral wall of the residual maxillary sinus in keeping with changes sinusitis. Other: Mastoid air cells and middle  ear cavities are clear. IMPRESSION: 1. Age-indeterminate lacunar infarct within the right basal ganglia. If indicated, this could be confirmed with MRI examination. 2. Moderate global parenchymal volume loss. 3. 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 4. Mild progressive exophthalmos. No retro-orbital mass or fluid collection. The extraocular musculature is normal in appears unchanged examination. 5. Extensive postsurgical changes within the visualized paranasal sinuses. Moderate diffuse paranasal sinus disease with evidence of sinusitis involving residual left maxillary  sinus. Electronically Signed   By: Helyn Numbers M.D.   On: 03/28/2024 00:29     Discharge Exam: Vitals:   03/28/24 0926 03/28/24 1408  BP: (!) 162/92 (!) 157/86  Pulse: (!) 58 61  Resp: 18 18  Temp:  (!) 97.4 F (36.3 C)  SpO2: 98% 98%   Vitals:   03/28/24 0429 03/28/24 0541 03/28/24 0926 03/28/24 1408  BP: (!) 164/95  (!) 162/92 (!) 157/86  Pulse: (!) 50 84 (!) 58 61  Resp: 18 17 18 18   Temp: (!) 97.5 F (36.4 C)   (!) 97.4 F (36.3 C)  TempSrc: Oral   Oral  SpO2: 96% 97% 98% 98%  Weight: 105 kg     Height: 6' (1.829 m)       General: Pt is alert, awake, not in acute distress Cardiovascular: RRR, S1/S2 +, no rubs, no gallops Respiratory: CTA bilaterally, no wheezing, no rhonchi Abdominal: Soft, NT, ND, bowel sounds + Extremities: no edema, no cyanosis    The results of significant diagnostics from this hospitalization (including imaging, microbiology, ancillary and laboratory) are listed below for reference.     Microbiology: Recent Results (from the past 240 hours)  Resp panel by RT-PCR (RSV, Flu A&B, Covid) Anterior Nasal Swab     Status: None   Collection Time: 03/28/24 12:35 AM   Specimen: Anterior Nasal Swab  Result Value Ref Range Status   SARS Coronavirus 2 by RT PCR NEGATIVE NEGATIVE Final    Comment: (NOTE) SARS-CoV-2 target nucleic acids are NOT DETECTED.  The SARS-CoV-2 RNA is generally detectable in upper respiratory specimens during the acute phase of infection. The lowest concentration of SARS-CoV-2 viral copies this assay can detect is 138 copies/mL. A negative result does not preclude SARS-Cov-2 infection and should not be used as the sole basis for treatment or other patient management decisions. A negative result may occur with  improper specimen collection/handling, submission of specimen other than nasopharyngeal swab, presence of viral mutation(s) within the areas targeted by this assay, and inadequate number of viral copies(<138  copies/mL). A negative result must be combined with clinical observations, patient history, and epidemiological information. The expected result is Negative.  Fact Sheet for Patients:  BloggerCourse.com  Fact Sheet for Healthcare Providers:  SeriousBroker.it  This test is no t yet approved or cleared by the [place] FDA and  has been authorized for detection and/or diagnosis of SARS-CoV-2 by FDA under an Emergency Use Authorization (EUA). This EUA will remain  in effect (meaning this test can be used) for the duration of the COVID-19 declaration under Section 564(b)(1) of the Act, 21 U.S.C.section 360bbb-3(b)(1), unless the authorization is terminated  or revoked sooner.       Influenza A by PCR NEGATIVE NEGATIVE Final   Influenza B by PCR NEGATIVE NEGATIVE Final    Comment: (NOTE) The Xpert Xpress SARS-CoV-2/FLU/RSV plus assay is intended as an aid in the diagnosis of influenza from Nasopharyngeal swab specimens and should not be used as a sole  basis for treatment. Nasal washings and aspirates are unacceptable for Xpert Xpress SARS-CoV-2/FLU/RSV testing.  Fact Sheet for Patients: BloggerCourse.com  Fact Sheet for Healthcare Providers: SeriousBroker.it  This test is not yet approved or cleared by the [place] FDA and has been authorized for detection and/or diagnosis of SARS-CoV-2 by FDA under an Emergency Use Authorization (EUA). This EUA will remain in effect (meaning this test can be used) for the duration of the COVID-19 declaration under Section 564(b)(1) of the Act, 21 U.S.C. section 360bbb-3(b)(1), unless the authorization is terminated or revoked.     Resp Syncytial Virus by PCR NEGATIVE NEGATIVE Final    Comment: (NOTE) Fact Sheet for Patients: BloggerCourse.com  Fact Sheet for Healthcare  Providers: SeriousBroker.it  This test is not yet approved or cleared by the [place] FDA and has been authorized for detection and/or diagnosis of SARS-CoV-2 by FDA under an Emergency Use Authorization (EUA). This EUA will remain in effect (meaning this test can be used) for the duration of the COVID-19 declaration under Section 564(b)(1) of the Act, 21 U.S.C. section 360bbb-3(b)(1), unless the authorization is terminated or revoked.  Performed at [facility], [address] [place], [place] [address]      Labs: BNP (last 3 results) No results for input(s): "BNP" in the last 8760 hours. Basic Metabolic Panel: Recent Labs  Lab 03/28/24 0055 03/28/24 0305  NA 136  --   K 3.3*  --   CL 100  --   CO2 26  --   GLUCOSE 173*  --   BUN 11  --   CREATININE 0.69  --   CALCIUM 9.3  --   MG 1.7  --   PHOS  --  3.3   Liver Function Tests: Recent Labs  Lab 03/28/24 0055  AST 13*  ALT 15  ALKPHOS 51  BILITOT 0.6  PROT 7.2  ALBUMIN 3.4*   No results for input(s): "LIPASE", "AMYLASE" in the last 168 hours. No results for input(s): "AMMONIA" in the last 168 hours. CBC: Recent Labs  Lab 03/28/24 0055  WBC 10.3  NEUTROABS 8.2*  HGB 11.0*  HCT 35.1*  MCV 82.0  PLT 474*   Cardiac Enzymes: No results for input(s): "CKTOTAL", "CKMB", "CKMBINDEX", "TROPONINI" in the last 168 hours. BNP: Invalid input(s): "POCBNP" CBG: Recent Labs  Lab 03/28/24 0030 03/28/24 0752 03/28/24 1142  GLUCAP 190* 152* 130*   D-Dimer No results for input(s): "DDIMER" in the last 72 hours. Hgb A1c Recent Labs    03/28/24 0055  HGBA1C 8.5*   Lipid Profile Recent Labs    03/28/24 0305  CHOL 196  HDL 43  LDLCALC 124*  TRIG 145  CHOLHDL 4.6   Thyroid function studies No results for input(s): "TSH", "T4TOTAL", "T3FREE", "THYROIDAB" in the last 72 hours.  Invalid input(s): "FREET3" Anemia work up No results for input(s): "VITAMINB12", "FOLATE",  "FERRITIN", "TIBC", "IRON", "RETICCTPCT" in the last 72 hours. Urinalysis    Component Value Date/Time   COLORURINE YELLOW 03/28/2024 0258   APPEARANCEUR CLEAR 03/28/2024 0258   LABSPEC 1.020 03/28/2024 0258   PHURINE 7.0 03/28/2024 0258   GLUCOSEU NEGATIVE 03/28/2024 0258   HGBUR NEGATIVE 03/28/2024 0258   BILIRUBINUR NEGATIVE 03/28/2024 0258   KETONESUR 5 (A) 03/28/2024 0258   PROTEINUR 100 (A) 03/28/2024 0258   NITRITE NEGATIVE 03/28/2024 0258   LEUKOCYTESUR NEGATIVE 03/28/2024 0258   Sepsis Labs Recent Labs  Lab 03/28/24 0055  WBC 10.3   Microbiology Recent Results (from the past 240 hours)  Resp panel by RT-PCR (RSV, Flu A&B, Covid) Anterior Nasal Swab     Status: None   Collection Time: 03/28/24 12:35 AM   Specimen: Anterior Nasal Swab  Result Value Ref Range Status   SARS Coronavirus 2 by RT PCR NEGATIVE NEGATIVE Final    Comment: (NOTE) SARS-CoV-2 target nucleic acids are NOT DETECTED.  The SARS-CoV-2 RNA is generally detectable in upper respiratory specimens during the acute phase of infection. The lowest concentration of SARS-CoV-2 viral copies this assay can detect is 138 copies/mL. A negative result does not preclude SARS-Cov-2 infection and should not be used as the sole basis for treatment or other patient management decisions. A negative result may occur with  improper specimen collection/handling, submission of specimen other than nasopharyngeal swab, presence of viral mutation(s) within the areas targeted by this assay, and inadequate number of viral copies(<138 copies/mL). A negative result must be combined with clinical observations, patient history, and epidemiological information. The expected result is Negative.  Fact Sheet for Patients:  BloggerCourse.com  Fact Sheet for Healthcare Providers:  SeriousBroker.it  This test is no t yet approved or cleared by the [place] FDA and  has been  authorized for detection and/or diagnosis of SARS-CoV-2 by FDA under an Emergency Use Authorization (EUA). This EUA will remain  in effect (meaning this test can be used) for the duration of the COVID-19 declaration under Section 564(b)(1) of the Act, 21 U.S.C.section 360bbb-3(b)(1), unless the authorization is terminated  or revoked sooner.       Influenza A by PCR NEGATIVE NEGATIVE Final   Influenza B by PCR NEGATIVE NEGATIVE Final    Comment: (NOTE) The Xpert Xpress SARS-CoV-2/FLU/RSV plus assay is intended as an aid in the diagnosis of influenza from Nasopharyngeal swab specimens and should not be used as a sole basis for treatment. Nasal washings and aspirates are unacceptable for Xpert Xpress SARS-CoV-2/FLU/RSV testing.  Fact Sheet for Patients: BloggerCourse.com  Fact Sheet for Healthcare Providers: SeriousBroker.it  This test is not yet approved or cleared by the [place] FDA and has been authorized for detection and/or diagnosis of SARS-CoV-2 by FDA under an Emergency Use Authorization (EUA). This EUA will remain in effect (meaning this test can be used) for the duration of the COVID-19 declaration under Section 564(b)(1) of the Act, 21 U.S.C. section 360bbb-3(b)(1), unless the authorization is terminated or revoked.     Resp Syncytial Virus by PCR NEGATIVE NEGATIVE Final    Comment: (NOTE) Fact Sheet for Patients: BloggerCourse.com  Fact Sheet for Healthcare Providers: SeriousBroker.it  This test is not yet approved or cleared by the [place] FDA and has been authorized for detection and/or diagnosis of SARS-CoV-2 by FDA under an Emergency Use Authorization (EUA). This EUA will remain in effect (meaning this test can be used) for the duration of the COVID-19 declaration under Section 564(b)(1) of the Act, 21 U.S.C. section 360bbb-3(b)(1), unless the  authorization is terminated or revoked.  Performed at [facility], [address], [place], [place] [address]      Time coordinating discharge: 35 minutes  SIGNED:   Erick Blinks, DO [facility] 03/28/2024, 3:47 PM  If 7PM-7AM, please contact night-coverage [email or web address]

## 2024-03-28 NOTE — Evaluation (Signed)
 Occupational Therapy Evaluation Patient Details Name: Nicholas Delacruz MRN: [id number] DOB: 1966-01-23 Today's Date: 03/28/2024   History of Present Illness   Nicholas Delacruz is a 58 y.o. male with medical history significant of hypertension, T2DM, OSA on CPAP, GERD who presents to the emergency department from home via EMS due to 2-day onset of confusion.  Patient states that he feels like he has difficulty in being able to get his words out, despite knowing what he wants to say.  Patient states that he was taking gabapentin due to back pain for a month and that his symptoms started 2 days after stopping the medication.  He denies slurred speech, chest pain, shortness of breath, difficulty in ambulation. (per DO)     Clinical Impressions Pt agreeable to OT evaluation. Pt reports recent history of herniated disk in back. Reports no issues today other than the back and speech deficits. Pt ambulated independent, donned shoes while standing, and used the bathroom independently. Difficulty tracking but pt reported he had just woke up. No significant concerns. Pt left in the bed with call bell within reach. Pt is not recommended for further acute OT services and will be discharged to care of nursing staff for remaining length of stay.              Functional Status Assessment   Patient has not had a recent decline in their functional status     Equipment Recommendations   None recommended by OT             Precautions/Restrictions   Precautions Precautions: None Restrictions Weight Bearing Restrictions Per Provider Order: No     Mobility Bed Mobility Overal bed mobility: Independent                  Transfers Overall transfer level: Independent                        Balance Overall balance assessment: Independent                                         ADL either performed or assessed with clinical judgement   ADL Overall ADL's  : Independent                                             Vision Baseline Vision/History: 1 Wears glasses Ability to See in Adequate Light: 1 Impaired Patient Visual Report: No change from baseline Vision Assessment?: Yes Tracking/Visual Pursuits: Other (comment) (Difficulty tracking in all field but pt stated he had just woken up. Pt had no visual concerns.) Visual Fields: No apparent deficits Additional Comments: Exophthalmos     Perception Perception: Not tested       Praxis Praxis: Not tested       Pertinent Vitals/Pain Pain Assessment Pain Assessment: No/denies pain     Extremity/Trunk Assessment Upper Extremity Assessment Upper Extremity Assessment: Overall WFL for tasks assessed   Lower Extremity Assessment Lower Extremity Assessment: Defer to PT evaluation   Cervical / Trunk Assessment Cervical / Trunk Assessment: Normal   Communication Communication Communication: Impaired Factors Affecting Communication: Other (comment);Difficulty expressing self (mild issues with word finding/ jumbled speech.)   Cognition Arousal: Alert Behavior During Therapy: [facility] for tasks assessed/performed  Cognition: No apparent impairments                               Following commands: Intact       Cueing  General Comments   Cueing Techniques: Verbal cues                 Home Living Family/patient expects to be discharged to:: Private residence Living Arrangements: Parent Available Help at Discharge: Family Type of Home: House Home Access: Ramped entrance     Home Layout: One level     Bathroom Shower/Tub: Producer, television/film/video: Handicapped height Bathroom Accessibility: Yes How Accessible: Accessible via wheelchair;Accessible via walker Home Equipment: Cane - single point;Shower seat;Grab bars - Chartered loss adjuster (2 wheels)          Prior Functioning/Environment Prior Level of Function :  Independent/Modified Independent             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     AM-PAC OT "6 Clicks" Daily Activity     Outcome Measure Help from another person eating meals?: None Help from another person taking care of personal grooming?: None Help from another person toileting, which includes using toliet, bedpan, or urinal?: None Help from another person bathing (including washing, rinsing, drying)?: None Help from another person to put on and taking off regular upper body clothing?: None Help from another person to put on and taking off regular lower body clothing?: None 6 Click Score: 24   End of Session Nurse Communication: Mobility status  Activity Tolerance: Patient tolerated treatment well Patient left: in bed;with call bell/phone within reach  OT Visit Diagnosis: Cognitive communication deficit (R41.841) Symptoms and signs involving cognitive functions:  (unknown)                Time: 1610-9604 OT Time Calculation (min): 12 min Charges:  OT General Charges $OT Visit: 1 Visit OT Evaluation $OT Eval Low Complexity: 1 Low  Dawnisha Marquina OT, MOT   Danie Chandler 03/28/2024, 9:19 AM

## 2024-03-28 NOTE — H&P (Signed)
 History and Physical    Patient: Nicholas Delacruz ZOX:[id number] DOB: [birth year] DOA: 03/27/2024 DOS: the patient was seen and examined on 03/28/2024 PCP: Assunta Found, MD  Patient coming from: Home  Chief Complaint:  Chief Complaint  Patient presents with   Can't Focus   HPI: Nicholas Delacruz is a 58 y.o. male with medical history significant of hypertension, T2DM, OSA on CPAP, GERD who presents to the emergency department from home via EMS due to 2-day onset of confusion.  Patient states that he feels like he has difficulty in being able to get his words out, despite knowing what he wants to say.  Patient states that he was taking gabapentin due to back pain for a month and that his symptoms started 2 days after stopping the medication.  He denies slurred speech, chest pain, shortness of breath, difficulty in ambulation.  ED Course:  In the emergency department, BP was 174/100, other vital signs were within normal range.  Workup in the ED CBC showed normocytic anemia and thrombocytosis.  BMP was positive for hypokalemia and hyperglycemia, albumin 3.4, urine drug screen was positive for cocaine, urinalysis was normal, troponin x 2 was flat at 9.  Influenza A, B, SARS-CoV-2, RSV was negative. CTA head without contrast showed age-indeterminate lacunar infarct within the right basal ganglia.  Moderate global parenchymal volume loss.  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 Hospitalist was asked to admit patient for further evaluation and management.  Review of Systems: Review of systems as noted in the HPI. All other systems reviewed and are negative.   Past Medical History:  Diagnosis Date   Arthritis    Diabetes mellitus without complication (HCC)    GERD (gastroesophageal reflux disease)    High blood pressure    Obstructive sleep apnea on CPAP    BIPAP   PONV (postoperative nausea and vomiting)    blood pressure dropped in recovery   Primary  localized osteoarthritis of left knee 03/27/2018   Past Surgical History:  Procedure Laterality Date   APPENDECTOMY     KNEE ARTHROPLASTY     KNEE ARTHROSCOPY Right 01/02/2015   Procedure: RIGHT KNEE ARTHROSCOPY, LATERAL AND MEDIAL MENISECTOMY;  Surgeon: Vickki Hearing, MD;  Location: [facility];  Service: Orthopedics;  Laterality: Right;   KNEE ARTHROSCOPY WITH MEDIAL MENISECTOMY Left 06/25/2014   Procedure: KNEE ARTHROSCOPY WITH MEDIAL MENISECTOMY;  Surgeon: Vickki Hearing, MD;  Location: [facility];  Service: Orthopedics;  Laterality: Left;   KNEE SURGERY Left    MENISCUS DEBRIDEMENT Right 01/02/2015   Procedure: DEBRIDEMENT OF RIGHT KNEE JOINT;  Surgeon: Vickki Hearing, MD;  Location: [facility];  Service: Orthopedics;  Laterality: Right;   PARTIAL KNEE ARTHROPLASTY Left 03/27/2018   Procedure: UNICOMPARTMENTAL LEFT KNEE;  Surgeon: Teryl Lucy, MD;  Location: [facility];  Service: Orthopedics;  Laterality: Left;   right knee sark     SHOULDER SURGERY     left   TOTAL KNEE ARTHROPLASTY Right 08/30/2016   Procedure: TOTAL KNEE ARTHROPLASTY;  Surgeon: Teryl Lucy, MD;  Location: [facility];  Service: Orthopedics;  Laterality: Right;   TOTAL SHOULDER ARTHROPLASTY      Social History:  reports that he has never smoked. He has never used smokeless tobacco. He reports current alcohol use. He reports that he does not use drugs.   Allergies  Allergen Reactions   Levofloxacin Anaphylaxis    Family History  Problem Relation Age of Onset   Alzheimer's disease Father  Arthritis Unknown      Prior to Admission medications   Medication Sig Start Date End Date Taking? Authorizing Provider  cephALEXin (KEFLEX) 500 MG capsule Take 1 capsule (500 mg total) by mouth 2 (two) times daily. 08/04/23   Particia Nearing, PA-C  diltiazem (CARDIZEM CD) 300 MG 24 hr capsule Take 300 mg by mouth at bedtime.  05/25/16   [provider]  labetalol (NORMODYNE) 200 MG tablet Take 200 mg by mouth 2 (two)  times daily.  06/28/11   [provider]  losartan-hydrochlorothiazide (HYZAAR) 100-25 MG tablet Take 1 tablet by mouth daily.  09/04/17   [provider]  metFORMIN (GLUCOPHAGE) 500 MG tablet Take 500 mg by mouth 2 (two) times daily with a meal.    [provider]  mupirocin ointment (BACTROBAN) 2 % Apply 1 Application topically 2 (two) times daily. 08/04/23   Particia Nearing, PA-C  omeprazole (PRILOSEC) 40 MG capsule Take 40 mg by mouth daily.  09/29/17   [provider]  ondansetron (ZOFRAN) 4 MG tablet Take 1 tablet (4 mg total) by mouth every 8 (eight) hours as needed for nausea or vomiting. 03/27/18   Teryl Lucy, MD  VOLTAREN 1 % GEL APPLY 4GM TOPICALLY 4 TIMES DAILY AS DIRECTED. 12/06/19   Vickki Hearing, MD  zolpidem (AMBIEN) 10 MG tablet Take 10 mg by mouth at bedtime.     [provider]    Physical Exam: BP (!) 164/95 (BP Location: Left Arm)   Pulse 84   Temp (!) 97.5 F (36.4 C) (Oral)   Resp 17   Ht 6' (1.829 m)   Wt 105 kg   SpO2 97%   BMI 31.39 kg/m   General: 58 y.o. year-old male well developed well nourished in no acute distress.  Alert and oriented x3. HEENT: NCAT, EOMI Neck: Supple, trachea medial Cardiovascular: Regular rate and rhythm with no rubs or gallops.  No thyromegaly or JVD noted.  No lower extremity edema. 2/4 pulses in all 4 extremities. Respiratory: Clear to auscultation with no wheezes or rales. Good inspiratory effort. Abdomen: Soft, nontender nondistended with normal bowel sounds x4 quadrants. Muskuloskeletal: No cyanosis, clubbing or edema noted bilaterally Neuro: CN II-XII intact, strength 5/5 x 4, sensation, reflexes intact Skin: No ulcerative lesions noted or rashes Psychiatry: Judgement and insight appear normal. Mood is appropriate for condition and setting          Labs on Admission:  Basic Metabolic Panel: Recent Labs  Lab 03/28/24 0055  NA 136  K 3.3*  CL 100  CO2 26  GLUCOSE  173*  BUN 11  CREATININE 0.69  CALCIUM 9.3  MG 1.7   Liver Function Tests: Recent Labs  Lab 03/28/24 0055  AST 13*  ALT 15  ALKPHOS 51  BILITOT 0.6  PROT 7.2  ALBUMIN 3.4*   No results for input(s): "LIPASE", "AMYLASE" in the last 168 hours. No results for input(s): "AMMONIA" in the last 168 hours. CBC: Recent Labs  Lab 03/28/24 0055  WBC 10.3  NEUTROABS 8.2*  HGB 11.0*  HCT 35.1*  MCV 82.0  PLT 474*   Cardiac Enzymes: No results for input(s): "CKTOTAL", "CKMB", "CKMBINDEX", "TROPONINI" in the last 168 hours.  BNP (last 3 results) No results for input(s): "BNP" in the last 8760 hours.  ProBNP (last 3 results) No results for input(s): "PROBNP" in the last 8760 hours.  CBG: Recent Labs  Lab 03/28/24 0030  GLUCAP 190*    Radiological Exams  on Admission: CT Head Wo Contrast Result Date: 03/28/2024 CLINICAL DATA:  Altered mental status EXAM: CT HEAD WITHOUT CONTRAST TECHNIQUE: Contiguous axial images were obtained from the base of the skull through the vertex without intravenous contrast. RADIATION DOSE REDUCTION: This exam was performed according to the departmental dose-optimization program which includes automated exposure control, adjustment of the mA and/or kV according to patient size and/or use of iterative reconstruction technique. COMPARISON:  11/23/2010 FINDINGS: Brain: Moderate global parenchymal volume loss. Age-indeterminate lacunar infarct within the right basal ganglia. 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 No abnormal mass effect or midline shift. No abnormal intra or extra-axial mass lesion or fluid collection. Ventricular size is. Cerebellum is otherwise unremarkable. Vascular: No hyperdense vessel or unexpected calcification. Skull: Normal. Negative for fracture or focal lesion. Sinuses/Orbits: There is mild progressive exophthalmos. No retro-orbital mass or fluid collection. The extraocular musculature is normal in  appears unchanged examination. Extensive postsurgical changes are seen visualized paranasal sinuses. There is opacification of several air cells as well as the residual left and right maxillary sinus. Bony thickening and sclerosis involving the anterior and lateral wall of the residual maxillary sinus in keeping with changes sinusitis. Other: Mastoid air cells and middle ear cavities are clear. IMPRESSION: 1. Age-indeterminate lacunar infarct within the right basal ganglia. If indicated, this could be confirmed with MRI examination. 2. Moderate global parenchymal volume loss. 3. 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 4. Mild progressive exophthalmos. No retro-orbital mass or fluid collection. The extraocular musculature is normal in appears unchanged examination. 5. Extensive postsurgical changes within the visualized paranasal sinuses. Moderate diffuse paranasal sinus disease with evidence of sinusitis involving residual left maxillary sinus. Electronically Signed   By: Helyn Numbers M.D.   On: 03/28/2024 00:29    EKG: I independently viewed the EKG done and my findings are as followed: Normal sinus rhythm at a rate of 65 bpm  Assessment/Plan Present on Admission: **None**  Principal Problem:   Confusion Active Problems:   Normocytic anemia   Thrombocytosis   Hypokalemia   Hypoalbuminemia due to protein-calorie malnutrition (HCC)   Type 2 diabetes mellitus with hyperglycemia (HCC)   Drug abuse (HCC)  Confusion, rule out acute ischemic stroke Patient will be admitted to telemetry unit  Bilateral carotid ultrasound in the morning Echocardiogram in the morning MRI of brain without contrast in the morning Continue aspirin and statin Continue fall precautions and neuro checks Lipid panel and hemoglobin A1c will be checked Continue PT/SLP/OT eval and treat Bedside swallow eval by nursing prior to diet Neurologist will be consulted admission await  further recommendation  Normocytic anemia Hemoglobin 11.0, this is at baseline Continue to monitor with morning labs  Thrombocytosis possibly reactive Platelets 510, continue to monitor platelet levels  Hypokalemia K+ 3.3, this will be replenished  Hypoalbuminemia possible secondary to mild protein calorie malnutrition Albumin 3.4, protein supplement will be provided  Type 2 Diabetes mellitus with hyperglycemia Continue ISS and hypoglycemia protocol  Drug abuse Patient was positive for cocaine He was counseled on cocaine use cessation  Class I obesity (BMI 31.39) Diet and lifestyle modification  DVT prophylaxis: SCDs  Code Status: Full code  Family Communication: None at bedside  Consults: Neurology  Severity of Illness: The appropriate patient status for this patient is OBSERVATION. Observation status is judged to be reasonable and necessary in order to provide the required intensity of service to ensure the patient's safety. The patient's presenting symptoms, physical  exam findings, and initial radiographic and laboratory data in the context of their medical condition is felt to place them at decreased risk for further clinical deterioration. Furthermore, it is anticipated that the patient will be medically stable for discharge from the hospital within 2 midnights of admission.   Author: Frankey Shown, DO 03/28/2024 7:26 AM  For on call review [email or web address].

## 2024-03-28 NOTE — Evaluation (Signed)
 Speech Language Pathology Evaluation Patient Details Name: Nicholas Delacruz MRN: [id number] DOB: [birth year] Today's Date: 03/28/2024 Time: 6213-0865 SLP Time Calculation (min) (ACUTE ONLY): 29 min  Problem List:  Patient Active Problem List   Diagnosis Date Noted   Altered mental status 03/28/2024   Confusion 03/28/2024   Normocytic anemia 03/28/2024   Thrombocytosis 03/28/2024   Hypokalemia 03/28/2024   Hypoalbuminemia due to protein-calorie malnutrition (HCC) 03/28/2024   Type 2 diabetes mellitus with hyperglycemia (HCC) 03/28/2024   Drug abuse (HCC) 03/28/2024   Primary localized osteoarthritis of left knee 03/27/2018   S/P total knee arthroplasty 08/30/2016   Weakness of left leg 03/13/2014   Difficulty walking 03/13/2014   Arthritis of knee, left 01/21/2014   Cervicalgia 11/08/2011   Rheumatoid arthritis (HCC) 02/01/2011   RUPTURE ROTATOR CUFF 02/01/2011   IMPINGEMENT SYNDROME 12/07/2010   Primary localized osteoarthritis of right knee 09/28/2009   Essential hypertension 05/13/2009   Past Medical History:  Past Medical History:  Diagnosis Date   Arthritis    Diabetes mellitus without complication (HCC)    GERD (gastroesophageal reflux disease)    High blood pressure    Obstructive sleep apnea on CPAP    BIPAP   PONV (postoperative nausea and vomiting)    blood pressure dropped in recovery   Primary localized osteoarthritis of left knee 03/27/2018   Past Surgical History:  Past Surgical History:  Procedure Laterality Date   APPENDECTOMY     KNEE ARTHROPLASTY     KNEE ARTHROSCOPY Right 01/02/2015   Procedure: RIGHT KNEE ARTHROSCOPY, LATERAL AND MEDIAL MENISECTOMY;  Surgeon: Vickki Hearing, MD;  Location: [facility];  Service: Orthopedics;  Laterality: Right;   KNEE ARTHROSCOPY WITH MEDIAL MENISECTOMY Left 06/25/2014   Procedure: KNEE ARTHROSCOPY WITH MEDIAL MENISECTOMY;  Surgeon: Vickki Hearing, MD;  Location: [facility];  Service: Orthopedics;  Laterality: Left;   KNEE  SURGERY Left    MENISCUS DEBRIDEMENT Right 01/02/2015   Procedure: DEBRIDEMENT OF RIGHT KNEE JOINT;  Surgeon: Vickki Hearing, MD;  Location: [facility];  Service: Orthopedics;  Laterality: Right;   PARTIAL KNEE ARTHROPLASTY Left 03/27/2018   Procedure: UNICOMPARTMENTAL LEFT KNEE;  Surgeon: Teryl Lucy, MD;  Location: [facility];  Service: Orthopedics;  Laterality: Left;   right knee sark     SHOULDER SURGERY     left   TOTAL KNEE ARTHROPLASTY Right 08/30/2016   Procedure: TOTAL KNEE ARTHROPLASTY;  Surgeon: Teryl Lucy, MD;  Location: [facility];  Service: Orthopedics;  Laterality: Right;   TOTAL SHOULDER ARTHROPLASTY     HPI:  [name] is a 58 y.o. male with past medical history of hypertension, type 2 diabetes, obstructive sleep apnea on CPAP who presented with confusion.  Blood pressure on arrival was 170/100 mmHg.  UDS positive for cocaine.  CT head was done which showed age-indeterminate lacunar infarct in right basal ganglia as well as remote infarcts in left corona radiata and left cerebellar hemisphere.SLE requested. Pt passed the [place] swallow screen and has been started on a diet and reportedly doing well.   Assessment / Plan / Recommendation Clinical Impression  Cognitive linguistic evaluation completed at bedside. Pt likely very close to his baseline per his report. He indicates that he was confused yesterday and that his speech was "slurred", but that he is much improved today. He indicates that his speech is not yet back to baseline, but close. Language skills are intact. Pt recalled 2/4 words independently and recalled the remaining two words with a category  cue. He reports that he lives with his mother and is independent with driving, medication management, housekeeping, cooking, etc. No further SLP services indicated at this time. Pt was encouraged to reduce rate of speech and over-articulate to aid in speech intelligibility (however his speech was greater than 95% intelligible in  conversation today).    SLP Assessment  SLP Recommendation/Assessment: Patient does not need any further Speech Lanaguage Pathology Services SLP Visit Diagnosis: Cognitive communication deficit (R41.841);Dysarthria and anarthria (R47.1)    Recommendations for follow up therapy are one component of a multi-disciplinary discharge planning process, led by the attending physician.  Recommendations may be updated based on patient status, additional functional criteria and insurance authorization.    Follow Up Recommendations  No SLP follow up    Assistance Recommended at Discharge  None  Functional Status Assessment Patient has had a recent decline in their functional status and demonstrates the ability to make significant improvements in function in a reasonable and predictable amount of time.  Frequency and Duration           SLP Evaluation Cognition  Overall Cognitive Status: No family/caregiver present to determine baseline cognitive functioning Arousal/Alertness: Awake/alert Orientation Level: Oriented X4 Year: 2025 Month: April Day of Week: Correct Attention: Sustained (Serial 7s with one error in 5/5) Sustained Attention: Appears intact Memory: Impaired Memory Impairment: Retrieval deficit (2/4 without cues, 2/2 with category cue) Awareness: Appears intact Problem Solving: Appears intact Safety/Judgment: Appears intact       Comprehension  Auditory Comprehension Overall Auditory Comprehension: Appears within functional limits for tasks assessed Yes/No Questions: Within Functional Limits Commands: Within Functional Limits Conversation: Complex Visual Recognition/Discrimination Discrimination: Within Function Limits Reading Comprehension Reading Status: Not tested    Expression Expression Primary Mode of Expression: Verbal Verbal Expression Overall Verbal Expression: Appears within functional limits for tasks assessed Initiation: No impairment Automatic Speech:  Name;Social Response;Month of year Level of Generative/Spontaneous Verbalization: Conversation Repetition: No impairment Naming: No impairment Pragmatics: No impairment Interfering Components: Speech intelligibility (very mild "slur" but improved) Non-Verbal Means of Communication: Not applicable Written Expression Dominant Hand: Right Written Expression: Not tested   Oral / Motor  Oral Motor/Sensory Function Overall Oral Motor/Sensory Function: Within functional limits Motor Speech Overall Motor Speech: Appears within functional limits for tasks assessed Respiration: Within functional limits Phonation: Normal Resonance: Within functional limits Articulation: Within functional limitis Intelligibility: Intelligibility reduced Motor Planning: Witnin functional limits Motor Speech Errors: Not applicable Effective Techniques: Slow rate;Over-articulate           Thank you,  Havery Moros, CCC-SLP [phone]  Edythe Riches 03/28/2024, 4:01 PM

## 2024-03-29 ENCOUNTER — Other Ambulatory Visit: Payer: Self-pay

## 2024-03-29 DIAGNOSIS — I639 Cerebral infarction, unspecified: Secondary | ICD-10-CM

## 2024-03-29 NOTE — Progress Notes (Deleted)
 Per Dr. Sherryll Burger (hospitalist):  Hello, pt will need 30 day cardiac monitor for stroke thank    Monitor orders entered and pt information entered into [employer].

## 2024-03-29 NOTE — Progress Notes (Signed)
 Error

## 2024-04-12 ENCOUNTER — Ambulatory Visit: Attending: Cardiology

## 2024-04-12 DIAGNOSIS — I639 Cerebral infarction, unspecified: Secondary | ICD-10-CM

## 2024-04-18 ENCOUNTER — Encounter (HOSPITAL_COMMUNITY): Payer: Self-pay

## 2024-04-18 ENCOUNTER — Other Ambulatory Visit: Payer: Self-pay

## 2024-04-18 ENCOUNTER — Ambulatory Visit (HOSPITAL_COMMUNITY): Attending: Family Medicine

## 2024-04-18 DIAGNOSIS — I69398 Other sequelae of cerebral infarction: Secondary | ICD-10-CM | POA: Diagnosis present

## 2024-04-18 DIAGNOSIS — R2689 Other abnormalities of gait and mobility: Secondary | ICD-10-CM | POA: Diagnosis present

## 2024-04-18 DIAGNOSIS — M6281 Muscle weakness (generalized): Secondary | ICD-10-CM | POA: Diagnosis present

## 2024-04-18 DIAGNOSIS — Z8673 Personal history of transient ischemic attack (TIA), and cerebral infarction without residual deficits: Secondary | ICD-10-CM | POA: Insufficient documentation

## 2024-04-18 NOTE — Therapy (Signed)
 OUTPATIENT PHYSICAL THERAPY NEURO EVALUATION   Patient Name: Nicholas Delacruz MRN: [id number] DOB:07/24/1966, 58 y.o., male Today's Date: 04/18/2024   PCP:  weakness after cva   REFERRING PROVIDER: Minus Amel, MD  END OF SESSION:  PT End of Session - 04/18/24 1436     Visit Number 1    Number of Visits 7    Date for PT Re-Evaluation 05/30/24    Authorization Type [employer] Medicare    PT Start Time 1436    PT Stop Time 1514    PT Time Calculation (min) 38 min    Activity Tolerance Patient tolerated treatment well    Behavior During Therapy WFL for tasks assessed/performed             Past Medical History:  Diagnosis Date   Arthritis    Diabetes mellitus without complication (HCC)    GERD (gastroesophageal reflux disease)    High blood pressure    Obstructive sleep apnea on CPAP    BIPAP   PONV (postoperative nausea and vomiting)    blood pressure dropped in recovery   Primary localized osteoarthritis of left knee 03/27/2018   Past Surgical History:  Procedure Laterality Date   APPENDECTOMY     KNEE ARTHROPLASTY     KNEE ARTHROSCOPY Right 01/02/2015   Procedure: RIGHT KNEE ARTHROSCOPY, LATERAL AND MEDIAL MENISECTOMY;  Surgeon: Darrin Emerald, MD;  Location: [facility];  Service: Orthopedics;  Laterality: Right;   KNEE ARTHROSCOPY WITH MEDIAL MENISECTOMY Left 06/25/2014   Procedure: KNEE ARTHROSCOPY WITH MEDIAL MENISECTOMY;  Surgeon: Darrin Emerald, MD;  Location: [facility];  Service: Orthopedics;  Laterality: Left;   KNEE SURGERY Left    MENISCUS DEBRIDEMENT Right 01/02/2015   Procedure: DEBRIDEMENT OF RIGHT KNEE JOINT;  Surgeon: Darrin Emerald, MD;  Location: [facility];  Service: Orthopedics;  Laterality: Right;   PARTIAL KNEE ARTHROPLASTY Left 03/27/2018   Procedure: UNICOMPARTMENTAL LEFT KNEE;  Surgeon: Osa Blase, MD;  Location: [facility];  Service: Orthopedics;  Laterality: Left;   right knee sark     SHOULDER SURGERY     left   TOTAL KNEE ARTHROPLASTY Right  08/30/2016   Procedure: TOTAL KNEE ARTHROPLASTY;  Surgeon: Osa Blase, MD;  Location: [facility];  Service: Orthopedics;  Laterality: Right;   TOTAL SHOULDER ARTHROPLASTY     Patient Active Problem List   Diagnosis Date Noted   Altered mental status 03/28/2024   Confusion 03/28/2024   Normocytic anemia 03/28/2024   Thrombocytosis 03/28/2024   Hypokalemia 03/28/2024   Hypoalbuminemia due to protein-calorie malnutrition (HCC) 03/28/2024   Type 2 diabetes mellitus with hyperglycemia (HCC) 03/28/2024   Drug abuse (HCC) 03/28/2024   Primary localized osteoarthritis of left knee 03/27/2018   S/P total knee arthroplasty 08/30/2016   Weakness of left leg 03/13/2014   Difficulty walking 03/13/2014   Arthritis of knee, left 01/21/2014   Cervicalgia 11/08/2011   Rheumatoid arthritis (HCC) 02/01/2011   RUPTURE ROTATOR CUFF 02/01/2011   IMPINGEMENT SYNDROME 12/07/2010   Primary localized osteoarthritis of right knee 09/28/2009   Essential hypertension 05/13/2009    ONSET DATE: April 1  REFERRING DIAG:  weakness after cva    THERAPY DIAG:  Muscle weakness (generalized)  Impaired balance as late effect of cerebrovascular accident (CVA)  History of CVA (cerebrovascular accident)  Rationale for Evaluation and Treatment: Rehabilitation  SUBJECTIVE:  SUBJECTIVE STATEMENT: Pt reports he had a CVA on April 1st, Has trouble standing sometimes and thinking. Balance is the biggest issue with standing. Feels okay when walking. Some issues with speaking. Word finding being biggest issue. Also reports grip strength feels weaker bilaterally.  Pt accompanied by: self  PERTINENT HISTORY: MRI showed chronic CVA, prior to April's  PAIN:  Are you having pain? No  PRECAUTIONS: None  RED FLAGS: None   WEIGHT  BEARING RESTRICTIONS: No  FALLS: Has patient fallen in last 6 months? Yes. Number of falls 1. 1 fall after leaving hospital. Marvell Slider after STS transfers, took a few steps, and lost balance, fell against wall and slid down  LIVING ENVIRONMENT: Lives with: lives with their family Lives in: House/apartment Stairs: Yes: External: 3 steps; on right going up Has following equipment at home: None  PLOF: Independent  PATIENT GOALS: "To get back to normal"  OBJECTIVE:  Note: Objective measures were completed at Evaluation unless otherwise noted.  DIAGNOSTIC FINDINGS: IMPRESSION: 1. Age-indeterminate lacunar infarct within the right basal ganglia. If indicated, this could be confirmed with MRI examination. 2. Moderate global parenchymal volume loss. 3. 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 4. Mild progressive exophthalmos. No retro-orbital mass or fluid collection. The extraocular musculature is normal in appears unchanged examination. 5. Extensive postsurgical changes within the visualized paranasal sinuses. Moderate diffuse paranasal sinus disease with evidence of sinusitis involving residual left maxillary sinus.  IMPRESSION: 1. Acute finding is a Left hemisphere lacunar infarct involving the left corona radiata and lentiform. No associated hemorrhage or mass effect.   2. Otherwise chronic white matter disease and a chronic left cerebellum PICA territory infarct.  COGNITION: Overall cognitive status: Within functional limits for tasks assessed   SENSATION: WFL  COORDINATION: WNL- Heel to shin, alternating foot tap, finger to nose   MUSCLE TONE:   MUSCLE LENGTH:   DTRs:    POSTURE: rounded shoulders and forward head  LOWER EXTREMITY ROM:     Active  Right Eval Left Eval  Hip flexion    Hip extension    Hip abduction    Hip adduction    Hip internal rotation    Hip external rotation    Knee flexion    Knee extension     Ankle dorsiflexion    Ankle plantarflexion    Ankle inversion    Ankle eversion     (Blank rows = not tested)  LOWER EXTREMITY MMT:    MMT Right Eval Left Eval  Hip flexion 3+ 4-  Hip extension 3+ 3+  Hip abduction 4- 4-  Hip adduction    Hip internal rotation    Hip external rotation    Knee flexion 3+ 3+  Knee extension 4- 4-  Ankle dorsiflexion 4- 4-  Ankle plantarflexion    Ankle inversion    Ankle eversion    (Blank rows = not tested)  BED MOBILITY:  Findings: Sit to supine Complete Independence Supine to sit Complete Independence Rolling to Right Complete Independence Rolling to Left Complete Independence  TRANSFERS: Sit to stand: Modified independence  Assistive device utilized: Arm rests     Stand to sit: Complete Independence  Assistive device utilized: None      RAMP:    CURB:    STAIRS: Not tested GAIT: Findings: Gait Characteristics: step through pattern, decreased step length- Right, decreased step length- Left, antalgic, and decreased trunk rotation, Distance walked: 100, Assistive device utilized:None, Level of assistance: Complete Independence,  and Comments: Pt w/ no overt LOB or unsteadiness, labored movement during transfers, antalgic type gait but reports no pain.   FUNCTIONAL TESTS:  5 times sit to stand: 27sec 2 minute walk test:   4 Stage balance:   BLE,: 30"  Semitandem: 30"  SLS: R=6", L=9" MCSIB:  EO/ FS=30" EC/FS=30" EO/SS=30" EC/SS= 16"   PATIENT SURVEYS:  Total ABC score: 1110 / 1600 = 69.4 %                                                                                                                              TREATMENT DATE:  04/18/24: PT Eval and HEP    PATIENT EDUCATION: Education details: PT evaluation, objective findings, POC, Importance of HEP, Precautions, Clinic policies  Person educated: Patient Education method: Explanation and Demonstration Education comprehension: verbalized understanding and  returned demonstration  HOME EXERCISE PROGRAM: Access Code: [email or web address] URL: [email or web address] Date: 04/18/2024 Prepared by: Virgia Griffins Powell-Butler  Exercises - Single Leg Stance with Support  - 2 x daily - 7 x weekly - 3 sets - 30 hold - Sit to Stand  - 2 x daily - 7 x weekly - 2 sets - 10 reps - Standing Hip Extension  - 2 x daily - 7 x weekly - 2 sets - 10 reps - Standing Knee Flexion  - 2 x daily - 7 x weekly - 2 sets - 10 reps - Standing Hip Abduction with Counter Support  - 2 x daily - 7 x weekly - 2 sets - 10 reps  GOALS: Goals reviewed with patient? No  SHORT TERM GOALS: Target date: 05/02/24  Patient will be independent with performance of HEP to demonstrate adequate self management of symptoms.  Baseline:  Goal status: INITIAL  2.   Patient will report at least a 25% improvement with function or pain overall since beginning PT. Baseline:  Goal status: INITIAL   LONG TERM GOALS: Target date: 05/30/24  Patient will improve ABC Scale score by 10% to demonstrate improved perceived function while meeting MCID.  Baseline: see above Goal status: INITIAL 2.  Patient will be able to maintain SL balance on both Lower extremities at least 15" in order to show improved SL balance for improved function and stair navigation  Baseline: see above Goal status: INITIAL 3.  Patient will score at least a  4+ on LE MMT to show increased LE strength and/or power and improve ambulation/gait mechanics.  Baseline: see above Goal status: INITIAL 4. Patient will be able to perform 5xSTS test in 15 seconds or less to demonstrate improved LE power/strength for gait mechanics.   Baseline: see above Goal status: INITIAL  ASSESSMENT:  CLINICAL IMPRESSION: Patient is a 58 y.o. male who was seen today for physical therapy evaluation and treatment for  weakness after cva  On this date, patient demonstrates decreased LE strength and power bilaterally, impaired single leg balance, and  decreased activity tolerance/endurance which may all be  contributing to his increased risk of falls, altered gait, and overall decrease in function. Patient will benefit from skilled physical therapy in order to address the above to return to PLOF and improve QOL.    OBJECTIVE IMPAIRMENTS: Abnormal gait, decreased activity tolerance, decreased balance, decreased endurance, difficulty walking, decreased strength, and improper body mechanics.   ACTIVITY LIMITATIONS: standing, squatting, stairs, and transfers  PARTICIPATION LIMITATIONS: cleaning, laundry, shopping, community activity, occupation, and yard work  [facility] POTENTIAL: Good  CLINICAL DECISION MAKING: Stable/uncomplicated  EVALUATION COMPLEXITY: Low  PLAN:  PT FREQUENCY: 2x/week  PT DURATION: 6 weeks  PLANNED INTERVENTIONS: 97164- PT Re-evaluation, 97110-Therapeutic exercises, 97530- Therapeutic activity, [id number]- Neuromuscular re-education, 97535- Self Care, 69629- Manual therapy, [id number]- Gait training, Patient/Family education, Balance training, Stair training, and DME instructions  PLAN FOR NEXT SESSION: , Assess stair navigation, FGA or berg or DGI, progress LE strengthening    6:30 PM, 04/18/24 Marysue Sola, PT, DPT [facility] Medicare Auth Request Information  Date of referral: 04/11/24 Referring provider: Minus Amel, MD Referring diagnosis (ICD 10)?  M62.81 Treatment diagnosis (ICD 10)? (if different than referring diagnosis) M62.81, I69.398, R26.89, Z86.73  Functional Tool Score: Total ABC score: 1110 / 1600 = 69.4 % 4 Stage balance:   BLE,: 30"  Semitandem: 30"  SLS: R=6", L=9" MCSIB:  EO/ FS=30" EC/FS=30" EO/SS=30" EC/SS= 16"  5 times sit to stand: 27sec   What was this (referring dx) caused by? Other: CVA  Lonne Roan of Condition: Initial Onset (within last 3 months)   Laterality: Both  Current Functional Measure Score: Other ABC Scale  Objective  measurements identify impairments when they are compared to normal values, the uninvolved extremity, and prior level of function.  [x]  Yes  []  No  Objective assessment of functional ability: Moderate functional limitations   Briefly describe symptoms: See above  How did symptoms start: CVA  Average pain intensity:  Last 24 hours: N/A  Past week: N/A Not pain, Balance issues increasing fall risk  How often does the pt experience symptoms? Occasionally, Not pain, Balance issues increasing fall risk  How much have the symptoms interfered with usual daily activities? Moderately  How has condition changed since care began at this facility? No change  In general, how is the patients overall health? Good   BACK PAIN (STarT Back Screening Tool) No

## 2024-04-23 ENCOUNTER — Ambulatory Visit (HOSPITAL_COMMUNITY)

## 2024-05-02 ENCOUNTER — Encounter (HOSPITAL_COMMUNITY): Payer: Self-pay

## 2024-05-02 ENCOUNTER — Ambulatory Visit (HOSPITAL_COMMUNITY): Attending: Family Medicine

## 2024-05-02 DIAGNOSIS — R2689 Other abnormalities of gait and mobility: Secondary | ICD-10-CM | POA: Diagnosis present

## 2024-05-02 DIAGNOSIS — M6281 Muscle weakness (generalized): Secondary | ICD-10-CM | POA: Diagnosis present

## 2024-05-02 DIAGNOSIS — Z8673 Personal history of transient ischemic attack (TIA), and cerebral infarction without residual deficits: Secondary | ICD-10-CM | POA: Insufficient documentation

## 2024-05-02 DIAGNOSIS — I69398 Other sequelae of cerebral infarction: Secondary | ICD-10-CM | POA: Insufficient documentation

## 2024-05-02 NOTE — Therapy (Signed)
 OUTPATIENT PHYSICAL THERAPY NEURO EVALUATION   Patient Name: Nicholas Delacruz MRN: [id number] DOB:1966-08-05, 58 y.o., male Today's Date: 05/02/2024   PCP:  weakness after cva   REFERRING PROVIDER: Minus Amel, MD  END OF SESSION:  PT End of Session - 05/02/24 1355     Visit Number 2    Number of Visits 7    Date for PT Re-Evaluation 05/30/24    Authorization Type [employer]    Authorization Time Period Check auth    PT Start Time 1356    PT Stop Time 1440    PT Time Calculation (min) 44 min    Activity Tolerance Patient tolerated treatment well    Behavior During Therapy WFL for tasks assessed/performed             Past Medical History:  Diagnosis Date   Arthritis    Diabetes mellitus without complication (HCC)    GERD (gastroesophageal reflux disease)    High blood pressure    Obstructive sleep apnea on CPAP    BIPAP   PONV (postoperative nausea and vomiting)    blood pressure dropped in recovery   Primary localized osteoarthritis of left knee 03/27/2018   Past Surgical History:  Procedure Laterality Date   APPENDECTOMY     KNEE ARTHROPLASTY     KNEE ARTHROSCOPY Right 01/02/2015   Procedure: RIGHT KNEE ARTHROSCOPY, LATERAL AND MEDIAL MENISECTOMY;  Surgeon: Darrin Emerald, MD;  Location: [facility];  Service: Orthopedics;  Laterality: Right;   KNEE ARTHROSCOPY WITH MEDIAL MENISECTOMY Left 06/25/2014   Procedure: KNEE ARTHROSCOPY WITH MEDIAL MENISECTOMY;  Surgeon: Darrin Emerald, MD;  Location: [facility];  Service: Orthopedics;  Laterality: Left;   KNEE SURGERY Left    MENISCUS DEBRIDEMENT Right 01/02/2015   Procedure: DEBRIDEMENT OF RIGHT KNEE JOINT;  Surgeon: Darrin Emerald, MD;  Location: [facility];  Service: Orthopedics;  Laterality: Right;   PARTIAL KNEE ARTHROPLASTY Left 03/27/2018   Procedure: UNICOMPARTMENTAL LEFT KNEE;  Surgeon: Osa Blase, MD;  Location: [facility];  Service: Orthopedics;  Laterality: Left;   right knee sark     SHOULDER SURGERY     left    TOTAL KNEE ARTHROPLASTY Right 08/30/2016   Procedure: TOTAL KNEE ARTHROPLASTY;  Surgeon: Osa Blase, MD;  Location: [facility];  Service: Orthopedics;  Laterality: Right;   TOTAL SHOULDER ARTHROPLASTY     Patient Active Problem List   Diagnosis Date Noted   Altered mental status 03/28/2024   Confusion 03/28/2024   Normocytic anemia 03/28/2024   Thrombocytosis 03/28/2024   Hypokalemia 03/28/2024   Hypoalbuminemia due to protein-calorie malnutrition (HCC) 03/28/2024   Type 2 diabetes mellitus with hyperglycemia (HCC) 03/28/2024   Drug abuse (HCC) 03/28/2024   Primary localized osteoarthritis of left knee 03/27/2018   S/P total knee arthroplasty 08/30/2016   Weakness of left leg 03/13/2014   Difficulty walking 03/13/2014   Arthritis of knee, left 01/21/2014   Cervicalgia 11/08/2011   Rheumatoid arthritis (HCC) 02/01/2011   RUPTURE ROTATOR CUFF 02/01/2011   IMPINGEMENT SYNDROME 12/07/2010   Primary localized osteoarthritis of right knee 09/28/2009   Essential hypertension 05/13/2009    ONSET DATE: April 1  REFERRING DIAG:  weakness after cva    THERAPY DIAG:  Muscle weakness (generalized)  Impaired balance as late effect of cerebrovascular accident (CVA)  History of CVA (cerebrovascular accident)  Rationale for Evaluation and Treatment: Rehabilitation  SUBJECTIVE:  SUBJECTIVE STATEMENT: Pt reports he hadn't been feeling too good since last visit. Still most trouble with balance and speaking. Reports he's come close to falling but hasn't had a fall since last visit. Reports he was trying to get up from bed, he was leaning forward and couldn't straighten up in time. Reports he has been doing HEP daily.    Pt reports he had a CVA on April 1st, Has trouble standing sometimes and thinking.  Balance is the biggest issue with standing. Feels okay when walking. Some issues with speaking. Word finding being biggest issue. Also reports grip strength feels weaker bilaterally.  Pt accompanied by: self  PERTINENT HISTORY: MRI showed chronic CVA, prior to April's  PAIN:  Are you having pain? No  PRECAUTIONS: None  RED FLAGS: None   WEIGHT BEARING RESTRICTIONS: No  FALLS: Has patient fallen in last 6 months? Yes. Number of falls 1. 1 fall after leaving hospital. Marvell Slider after STS transfers, took a few steps, and lost balance, fell against wall and slid down  LIVING ENVIRONMENT: Lives with: lives with their family Lives in: House/apartment Stairs: Yes: External: 3 steps; on right going up Has following equipment at home: None  PLOF: Independent  PATIENT GOALS: "To get back to normal"  OBJECTIVE:  Note: Objective measures were completed at Evaluation unless otherwise noted.  DIAGNOSTIC FINDINGS: IMPRESSION: 1. Age-indeterminate lacunar infarct within the right basal ganglia. If indicated, this could be confirmed with MRI examination. 2. Moderate global parenchymal volume loss. 3. 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 4. Mild progressive exophthalmos. No retro-orbital mass or fluid collection. The extraocular musculature is normal in appears unchanged examination. 5. Extensive postsurgical changes within the visualized paranasal sinuses. Moderate diffuse paranasal sinus disease with evidence of sinusitis involving residual left maxillary sinus.  IMPRESSION: 1. Acute finding is a Left hemisphere lacunar infarct involving the left corona radiata and lentiform. No associated hemorrhage or mass effect.   2. Otherwise chronic white matter disease and a chronic left cerebellum PICA territory infarct.  COGNITION: Overall cognitive status: Within functional limits for tasks assessed   SENSATION: WFL  COORDINATION: WNL- Heel to  shin, alternating foot tap, finger to nose   MUSCLE TONE:   MUSCLE LENGTH:   DTRs:    POSTURE: rounded shoulders and forward head  LOWER EXTREMITY ROM:     Active  Right Eval Left Eval  Hip flexion    Hip extension    Hip abduction    Hip adduction    Hip internal rotation    Hip external rotation    Knee flexion    Knee extension    Ankle dorsiflexion    Ankle plantarflexion    Ankle inversion    Ankle eversion     (Blank rows = not tested)  LOWER EXTREMITY MMT:    MMT Right Eval Left Eval  Hip flexion 3+ 4-  Hip extension 3+ 3+  Hip abduction 4- 4-  Hip adduction    Hip internal rotation    Hip external rotation    Knee flexion 3+ 3+  Knee extension 4- 4-  Ankle dorsiflexion 4- 4-  Ankle plantarflexion    Ankle inversion    Ankle eversion    (Blank rows = not tested)  BED MOBILITY:  Findings: Sit to supine Complete Independence Supine to sit Complete Independence Rolling to Right Complete Independence Rolling to Left Complete Independence  TRANSFERS: Sit to stand: Modified independence  Assistive device utilized: Arm rests  Stand to sit: Complete Independence  Assistive device utilized: None      RAMP:    CURB:    STAIRS: Not tested GAIT: Findings: Gait Characteristics: step through pattern, decreased step length- Right, decreased step length- Left, antalgic, and decreased trunk rotation, Distance walked: 100, Assistive device utilized:None, Level of assistance: Complete Independence, and Comments: Pt w/ no overt LOB or unsteadiness, labored movement during transfers, antalgic type gait but reports no pain.   FUNCTIONAL TESTS:  5 times sit to stand: 27sec 2 minute walk test: 348 ft 4 Stage balance:   BLE,: 30"  Semitandem: 30"  SLS: R=6", L=9" MCSIB:  EO/ FS=30" EC/FS=30" EO/SS=30" EC/SS= 16" DGI: 18/24  PATIENT SURVEYS:  Total ABC score: 1110 / 1600 = 69.4 %                                                                                                                               TREATMENT DATE:  05/02/24:  Review of goals and HEP Standing hip abd, 10x Standing hip ext, 10x Standing hip flex, 10x SLS, 30" at a time, pt having to catch self once t/o  test  Tandem Walking: 62ft x2  +horizontal head turns, 34ft x 2  +vertical head turns, 20 ftx2  STS, feet on foam, 10x Lunges onto BOSU, 10x on each LE, prog from BUE -->no UE on last two reps Side steps on aeromat, in // bars, down and back 3x, no UE use  DGI 1. Gait level surface (3) Normal: Walks 20', no assistive devices, good sped, no evidence for imbalance, normal gait pattern 2. Change in gait speed (1) Moderate Impairment: Makes only minor adjustments to walking speed, or accomplishes a change in speed with significant gait deviations, or changes speed but has significant gait deviations, or changes speed but loses balance but is able to recover and continue walking. 3. Gait with horizontal head turns (2) Mild Impairment: Performs head turns smoothly with slight change in gait velocity, i.e., minor disruption to smooth gait path or uses walking aid. 4. Gait with vertical head turns (2) Mild Impairment: Performs head turns smoothly with slight change in gait velocity, i.e., minor disruption to smooth gait path or uses walking aid. 5. Gait and pivot turn (2) Mild Impairment: Pivot turns safely in > 3 seconds and stops with no loss of balance. 6. Step over obstacle (3) Normal: Is able to step over the box without changing gait speed, no evidence of imbalance. 7. Step around obstacles (3) Normal: Is able to walk around cones safely without changing gait speed; no evidence of imbalance. 8. Stairs (2) Mild Impairment: Alternating feet, must use rail.  TOTAL SCORE: 18 / 24   04/18/24:  PT Eval and HEP    PATIENT EDUCATION: Education details: PT evaluation, objective findings, POC, Importance of HEP, Precautions, Clinic policies  Person  educated: Patient Education method: Explanation and Demonstration Education comprehension: verbalized understanding and returned demonstration  HOME EXERCISE PROGRAM: Access Code: [email or web address] URL: [email or web address] Date: 04/18/2024 Prepared by: Virgia Griffins Powell-Butler  Exercises - Single Leg Stance with Support  - 2 x daily - 7 x weekly - 3 sets - 30 hold - Sit to Stand  - 2 x daily - 7 x weekly - 2 sets - 10 reps - Standing Hip Extension  - 2 x daily - 7 x weekly - 2 sets - 10 reps - Standing Knee Flexion  - 2 x daily - 7 x weekly - 2 sets - 10 reps - Standing Hip Abduction with Counter Support  - 2 x daily - 7 x weekly - 2 sets - 10 reps  GOALS: Goals reviewed with patient? Yes  SHORT TERM GOALS: Target date: 05/02/24  Patient will be independent with performance of HEP to demonstrate adequate self management of symptoms.  Baseline:  Goal status: INITIAL  2.   Patient will report at least a 25% improvement with function or pain overall since beginning PT. Baseline:  Goal status: INITIAL   LONG TERM GOALS: Target date: 05/30/24  Patient will improve ABC Scale score by 10% to demonstrate improved perceived function while meeting MCID.  Baseline: see above Goal status: INITIAL 2.  Patient will be able to maintain SL balance on both Lower extremities at least 15" in order to show improved SL balance for improved function and stair navigation  Baseline: see above Goal status: INITIAL 3.  Patient will score at least a  4+ on LE MMT to show increased LE strength and/or power and improve ambulation/gait mechanics.  Baseline: see above Goal status: INITIAL 4. Patient will be able to perform 5xSTS test in 15 seconds or less to demonstrate improved LE power/strength for gait mechanics.   Baseline: see above Goal status: INITIAL  ASSESSMENT:  CLINICAL IMPRESSION: Session began with review of set goals and review of HEP. Patient required verbal cueing to recall exercises  of HEP but performs with good carryover. Patient demo improved SL balance bilaterally with only having to use UE support one time t/o 30" interval. Patient reports mild low back pain and SOB following test. During DGI, pt demo most difficulty with horizontal head turns and vertical head turns, and change in gait speed. Remainder of sessio spent combining dynamic challenges. Inc difficulty with tandem walking with head turns, patient losing balance consistently and requiring a step to recover. Good balance during STS with feet on foam. Intermittent UE support needed during lunges and side steps.  Patient will benefit from continued skilled physical therapy in order to address lingering balance deficits, LE strength, and endurance in order to reduce fall risk and improve QOL.     Patient is a 58 y.o. male who was seen today for physical therapy evaluation and treatment for  weakness after cva  On this date, patient demonstrates decreased LE strength and power bilaterally, impaired single leg balance, and decreased activity tolerance/endurance which may all be contributing to his increased risk of falls, altered gait, and overall decrease in function. Patient will benefit from skilled physical therapy in order to address the above to return to PLOF and improve QOL.    OBJECTIVE IMPAIRMENTS: Abnormal gait, decreased activity tolerance, decreased balance, decreased endurance, difficulty walking, decreased strength, and improper body mechanics.   ACTIVITY LIMITATIONS: standing, squatting, stairs, and transfers  PARTICIPATION LIMITATIONS: cleaning, laundry, shopping, community activity, occupation, and yard work  [facility] POTENTIAL: Good  CLINICAL DECISION MAKING: Stable/uncomplicated  EVALUATION COMPLEXITY: Low  PLAN:  PT  FREQUENCY: 2x/week  PT DURATION: 6 weeks  PLANNED INTERVENTIONS: 97164- PT Re-evaluation, 97110-Therapeutic exercises, 97530- Therapeutic activity, 97112- Neuromuscular  re-education, 97535- Self Care, [id number]- Manual therapy, [phone]- Gait training, Patient/Family education, Balance training, Stair training, and DME instructions  PLAN FOR NEXT SESSION: Assess stair navigation, progress LE strengthening    3:00 PM, 05/02/24 Nevia Henkin Powell-Butler, PT, DPT [facility] [place]

## 2024-05-08 ENCOUNTER — Ambulatory Visit (HOSPITAL_COMMUNITY)

## 2024-05-09 ENCOUNTER — Encounter (HOSPITAL_COMMUNITY): Admitting: Speech Pathology

## 2024-05-09 ENCOUNTER — Telehealth (HOSPITAL_COMMUNITY): Payer: Self-pay | Admitting: Speech Pathology

## 2024-05-09 NOTE — Telephone Encounter (Signed)
 Telephone Call:  Pt did not show for his outpatient SLP evaluation on 05/09/24. SLP left a voicemail asking Pt to return phone call to reschedule and to remind him of his PT appointment next Wednesday 05/15/24 at 3:15 PM.  Thank you,  Claudetta Cuba, CCC-SLP [phone]

## 2024-05-13 DIAGNOSIS — I498 Other specified cardiac arrhythmias: Secondary | ICD-10-CM | POA: Diagnosis not present

## 2024-05-15 ENCOUNTER — Ambulatory Visit (HOSPITAL_COMMUNITY): Admitting: Physical Therapy

## 2024-05-15 DIAGNOSIS — M6281 Muscle weakness (generalized): Secondary | ICD-10-CM | POA: Diagnosis not present

## 2024-05-15 DIAGNOSIS — Z8673 Personal history of transient ischemic attack (TIA), and cerebral infarction without residual deficits: Secondary | ICD-10-CM

## 2024-05-15 DIAGNOSIS — I69398 Other sequelae of cerebral infarction: Secondary | ICD-10-CM

## 2024-05-15 NOTE — Therapy (Signed)
 OUTPATIENT PHYSICAL THERAPY TREATMENT   Patient Name: Nicholas Delacruz MRN: [id number] DOB:1966/02/21, 58 y.o., male Today's Date: 05/15/2024   PCP:  weakness after cva   REFERRING PROVIDER: Minus Amel, MD  END OF SESSION:  PT End of Session - 05/15/24 1519     Visit Number 3    Number of Visits 7    Date for PT Re-Evaluation 05/30/24    Authorization Type [employer]    Authorization Time Period Check auth    PT Start Time 1520    PT Stop Time 1600    PT Time Calculation (min) 40 min    Activity Tolerance Patient tolerated treatment well    Behavior During Therapy WFL for tasks assessed/performed             Past Medical History:  Diagnosis Date   Arthritis    Diabetes mellitus without complication (HCC)    GERD (gastroesophageal reflux disease)    High blood pressure    Obstructive sleep apnea on CPAP    BIPAP   PONV (postoperative nausea and vomiting)    blood pressure dropped in recovery   Primary localized osteoarthritis of left knee 03/27/2018   Past Surgical History:  Procedure Laterality Date   APPENDECTOMY     KNEE ARTHROPLASTY     KNEE ARTHROSCOPY Right 01/02/2015   Procedure: RIGHT KNEE ARTHROSCOPY, LATERAL AND MEDIAL MENISECTOMY;  Surgeon: Darrin Emerald, MD;  Location: [facility];  Service: Orthopedics;  Laterality: Right;   KNEE ARTHROSCOPY WITH MEDIAL MENISECTOMY Left 06/25/2014   Procedure: KNEE ARTHROSCOPY WITH MEDIAL MENISECTOMY;  Surgeon: Darrin Emerald, MD;  Location: [facility];  Service: Orthopedics;  Laterality: Left;   KNEE SURGERY Left    MENISCUS DEBRIDEMENT Right 01/02/2015   Procedure: DEBRIDEMENT OF RIGHT KNEE JOINT;  Surgeon: Darrin Emerald, MD;  Location: [facility];  Service: Orthopedics;  Laterality: Right;   PARTIAL KNEE ARTHROPLASTY Left 03/27/2018   Procedure: UNICOMPARTMENTAL LEFT KNEE;  Surgeon: Osa Blase, MD;  Location: [facility];  Service: Orthopedics;  Laterality: Left;   right knee sark     SHOULDER SURGERY     left    TOTAL KNEE ARTHROPLASTY Right 08/30/2016   Procedure: TOTAL KNEE ARTHROPLASTY;  Surgeon: Osa Blase, MD;  Location: [facility];  Service: Orthopedics;  Laterality: Right;   TOTAL SHOULDER ARTHROPLASTY     Patient Active Problem List   Diagnosis Date Noted   Altered mental status 03/28/2024   Confusion 03/28/2024   Normocytic anemia 03/28/2024   Thrombocytosis 03/28/2024   Hypokalemia 03/28/2024   Hypoalbuminemia due to protein-calorie malnutrition (HCC) 03/28/2024   Type 2 diabetes mellitus with hyperglycemia (HCC) 03/28/2024   Drug abuse (HCC) 03/28/2024   Primary localized osteoarthritis of left knee 03/27/2018   S/P total knee arthroplasty 08/30/2016   Weakness of left leg 03/13/2014   Difficulty walking 03/13/2014   Arthritis of knee, left 01/21/2014   Cervicalgia 11/08/2011   Rheumatoid arthritis (HCC) 02/01/2011   RUPTURE ROTATOR CUFF 02/01/2011   IMPINGEMENT SYNDROME 12/07/2010   Primary localized osteoarthritis of right knee 09/28/2009   Essential hypertension 05/13/2009    ONSET DATE: April 1  REFERRING DIAG:  weakness after cva    THERAPY DIAG:  Muscle weakness (generalized)  Impaired balance as late effect of cerebrovascular accident (CVA)  History of CVA (cerebrovascular accident)  Rationale for Evaluation and Treatment: Rehabilitation  SUBJECTIVE:  SUBJECTIVE STATEMENT: Pt reports 4-5/10 pain in his back.  States he's been missing his appts because of his back.  States he rescheduled his speech evaluation. Admits to not doing his HEP.  States he has fallen 2X since he was last here due to bending forward to pick something up and falling forward.     Pt reports he had a CVA on April 1st, Has trouble standing sometimes and thinking. Balance is the biggest issue with standing.  Feels okay when walking. Some issues with speaking. Word finding being biggest issue. Also reports grip strength feels weaker bilaterally.  Pt accompanied by: self  PERTINENT HISTORY: MRI showed chronic CVA, prior to April's  PAIN:  Are you having pain? No  PRECAUTIONS: None  RED FLAGS: None   WEIGHT BEARING RESTRICTIONS: No  FALLS: Has patient fallen in last 6 months? Yes. Number of falls 1. 1 fall after leaving hospital. Marvell Slider after STS transfers, took a few steps, and lost balance, fell against wall and slid down  LIVING ENVIRONMENT: Lives with: lives with their family Lives in: House/apartment Stairs: Yes: External: 3 steps; on right going up Has following equipment at home: None  PLOF: Independent  PATIENT GOALS: "To get back to normal"  OBJECTIVE:  Note: Objective measures were completed at Evaluation unless otherwise noted.  DIAGNOSTIC FINDINGS: IMPRESSION: 1. Age-indeterminate lacunar infarct within the right basal ganglia. If indicated, this could be confirmed with MRI examination. 2. Moderate global parenchymal volume loss. 3. 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 4. Mild progressive exophthalmos. No retro-orbital mass or fluid collection. The extraocular musculature is normal in appears unchanged examination. 5. Extensive postsurgical changes within the visualized paranasal sinuses. Moderate diffuse paranasal sinus disease with evidence of sinusitis involving residual left maxillary sinus.  IMPRESSION: 1. Acute finding is a Left hemisphere lacunar infarct involving the left corona radiata and lentiform. No associated hemorrhage or mass effect.   2. Otherwise chronic white matter disease and a chronic left cerebellum PICA territory infarct.  COGNITION: Overall cognitive status: Within functional limits for tasks assessed   SENSATION: WFL  COORDINATION: WNL- Heel to shin, alternating foot tap, finger to  nose   MUSCLE TONE:   MUSCLE LENGTH:   DTRs:    POSTURE: rounded shoulders and forward head  LOWER EXTREMITY ROM:     Active  Right Eval Left Eval  Hip flexion    Hip extension    Hip abduction    Hip adduction    Hip internal rotation    Hip external rotation    Knee flexion    Knee extension    Ankle dorsiflexion    Ankle plantarflexion    Ankle inversion    Ankle eversion     (Blank rows = not tested)  LOWER EXTREMITY MMT:    MMT Right Eval Left Eval  Hip flexion 3+ 4-  Hip extension 3+ 3+  Hip abduction 4- 4-  Hip adduction    Hip internal rotation    Hip external rotation    Knee flexion 3+ 3+  Knee extension 4- 4-  Ankle dorsiflexion 4- 4-  Ankle plantarflexion    Ankle inversion    Ankle eversion    (Blank rows = not tested)  BED MOBILITY:  Findings: Sit to supine Complete Independence Supine to sit Complete Independence Rolling to Right Complete Independence Rolling to Left Complete Independence  TRANSFERS: Sit to stand: Modified independence  Assistive device utilized: Arm rests  Stand to sit: Complete Independence  Assistive device utilized: None      STAIRS: Not tested GAIT: Findings: Gait Characteristics: step through pattern, decreased step length- Right, decreased step length- Left, antalgic, and decreased trunk rotation, Distance walked: 100, Assistive device utilized:None, Level of assistance: Complete Independence, and Comments: Pt w/ no overt LOB or unsteadiness, labored movement during transfers, antalgic type gait but reports no pain.   FUNCTIONAL TESTS:  5 times sit to stand: 27sec 2 minute walk test: 348 ft 4 Stage balance:   BLE,: 30"  Semitandem: 30"  SLS: R=6", L=9" MCSIB:  EO/ FS=30" EC/FS=30" EO/SS=30" EC/SS= 16" DGI: 18/24  PATIENT SURVEYS:  Total ABC score: 1110 / 1600 = 69.4 %                                                                                                                               TREATMENT DATE:  02/16/24 Standing heel raises on incline 20X  Toe raises on decline 20X  Hip abduction 2X10 Red theraband  Hip extension 2X10 Red theraband  Hip flexion 2X10 Red theraband  Lunges onto BOSU  SLS 30" each X 2 each with intermittent HHA  Tandem stance 30"X2  Vector stance 10X5" Sit to stands feet on aeromat no UE 10X Balance beam tandem 1RT Balance beam side stepping 1RT  05/02/24:  Review of goals and HEP Standing hip abd, 10x Standing hip ext, 10x Standing hip flex, 10x SLS, 30" at a time, pt having to catch self once t/o  test  Tandem Walking: 89ft x2  +horizontal head turns, 73ft x 2  +vertical head turns, 20 ftx2  STS, feet on foam, 10x Lunges onto BOSU, 10x on each LE, prog from BUE -->no UE on last two reps Side steps on aeromat, in // bars, down and back 3x, no UE use  DGI 1. Gait level surface (3) Normal: Walks 20', no assistive devices, good sped, no evidence for imbalance, normal gait pattern 2. Change in gait speed (1) Moderate Impairment: Makes only minor adjustments to walking speed, or accomplishes a change in speed with significant gait deviations, or changes speed but has significant gait deviations, or changes speed but loses balance but is able to recover and continue walking. 3. Gait with horizontal head turns (2) Mild Impairment: Performs head turns smoothly with slight change in gait velocity, i.e., minor disruption to smooth gait path or uses walking aid. 4. Gait with vertical head turns (2) Mild Impairment: Performs head turns smoothly with slight change in gait velocity, i.e., minor disruption to smooth gait path or uses walking aid. 5. Gait and pivot turn (2) Mild Impairment: Pivot turns safely in > 3 seconds and stops with no loss of balance. 6. Step over obstacle (3) Normal: Is able to step over the box without changing gait speed, no evidence of imbalance. 7. Step around obstacles (3) Normal: Is able to walk around cones  safely without changing  gait speed; no evidence of imbalance. 8. Stairs (2) Mild Impairment: Alternating feet, must use rail.  TOTAL SCORE: 18 / 24   04/18/24:  PT Eval and HEP    PATIENT EDUCATION: Education details: PT evaluation, objective findings, POC, Importance of HEP, Precautions, Clinic policies  Person educated: Patient Education method: Explanation and Demonstration Education comprehension: verbalized understanding and returned demonstration  HOME EXERCISE PROGRAM: Access Code: [email or web address] URL: [email or web address] Date: 04/18/2024 Prepared by: Virgia Griffins Powell-Butler  Exercises - Single Leg Stance with Support  - 2 x daily - 7 x weekly - 3 sets - 30 hold - Sit to Stand  - 2 x daily - 7 x weekly - 2 sets - 10 reps - Standing Hip Extension  - 2 x daily - 7 x weekly - 2 sets - 10 reps - Standing Knee Flexion  - 2 x daily - 7 x weekly - 2 sets - 10 reps - Standing Hip Abduction with Counter Support  - 2 x daily - 7 x weekly - 2 sets - 10 reps  GOALS: Goals reviewed with patient? Yes  SHORT TERM GOALS: Target date: 05/02/24  Patient will be independent with performance of HEP to demonstrate adequate self management of symptoms.  Baseline:  Goal status: INITIAL  2.   Patient will report at least a 25% improvement with function or pain overall since beginning PT. Baseline:  Goal status: INITIAL   LONG TERM GOALS: Target date: 05/30/24  Patient will improve ABC Scale score by 10% to demonstrate improved perceived function while meeting MCID.  Baseline: see above Goal status: INITIAL 2.  Patient will be able to maintain SL balance on both Lower extremities at least 15" in order to show improved SL balance for improved function and stair navigation  Baseline: see above Goal status: INITIAL 3.  Patient will score at least a  4+ on LE MMT to show increased LE strength and/or power and improve ambulation/gait mechanics.  Baseline: see above Goal status:  INITIAL 4. Patient will be able to perform 5xSTS test in 15 seconds or less to demonstrate improved LE power/strength for gait mechanics.   Baseline: see above Goal status: INITIAL  ASSESSMENT:  CLINICAL IMPRESSION: Continued with focus on improving LE strength and stability.  Added theraband to hip exercises to increase resistance and added foam to sit to stand to further increase challenge.  This was the hardest activity for pt.  Also began dynamic balance challenges using foam aeromat. All activities completed with close supervision.  Pt able to self correct all minimal LOB.  5-10 sec max ability with single leg stance before needing UE to stabilize.  Added vectors to further improve glut and LE strength.  Suggested pt use reacher for items on floor rather than trying to pick up until he gets stronger for safety reasons.  Urged to do HEP for best results with therapy. Patient will benefit from continued skilled physical therapy in order to address lingering balance deficits, LE strength, and endurance in order to reduce fall risk and improve QOL.   Patient is a 58 y.o. male who was seen today for physical therapy evaluation and treatment for  weakness after cva  On this date, patient demonstrates decreased LE strength and power bilaterally, impaired single leg balance, and decreased activity tolerance/endurance which may all be contributing to his increased risk of falls, altered gait, and overall decrease in function. Patient will benefit from skilled physical therapy in order to address the above to return to  PLOF and improve QOL.    OBJECTIVE IMPAIRMENTS: Abnormal gait, decreased activity tolerance, decreased balance, decreased endurance, difficulty walking, decreased strength, and improper body mechanics.   ACTIVITY LIMITATIONS: standing, squatting, stairs, and transfers  PARTICIPATION LIMITATIONS: cleaning, laundry, shopping, community activity, occupation, and yard work  [facility]  POTENTIAL: Good  CLINICAL DECISION MAKING: Stable/uncomplicated  EVALUATION COMPLEXITY: Low  PLAN:  PT FREQUENCY: 2x/week  PT DURATION: 6 weeks  PLANNED INTERVENTIONS: 97164- PT Re-evaluation, 97110-Therapeutic exercises, 97530- Therapeutic activity, [id number]- Neuromuscular re-education, 97535- Self Care, 16109- Manual therapy, [phone]- Gait training, Patient/Family education, Balance training, Stair training, and DME instructions  PLAN FOR NEXT SESSION: Assess stair navigation, progress LE strengthening    4:20 PM, 05/15/24 Tamia Powell-Butler, PT, DPT [facility] [place]

## 2024-05-16 ENCOUNTER — Ambulatory Visit: Admitting: Internal Medicine

## 2024-05-21 ENCOUNTER — Ambulatory Visit (HOSPITAL_COMMUNITY)

## 2024-05-21 ENCOUNTER — Encounter: Payer: Self-pay | Admitting: Emergency Medicine

## 2024-05-21 ENCOUNTER — Other Ambulatory Visit: Payer: Self-pay

## 2024-05-21 ENCOUNTER — Ambulatory Visit
Admission: EM | Admit: 2024-05-21 | Discharge: 2024-05-21 | Disposition: A | Discharge status: Home / Self Care | Attending: Family Medicine | Admitting: Family Medicine

## 2024-05-21 ENCOUNTER — Telehealth (HOSPITAL_COMMUNITY): Payer: Self-pay

## 2024-05-21 DIAGNOSIS — M79604 Pain in right leg: Secondary | ICD-10-CM | POA: Diagnosis not present

## 2024-05-21 MED ORDER — DICLOFENAC SODIUM 1 % EX GEL: 2.0000 g | Freq: Four times a day (QID) | CUTANEOUS | 0 refills | Status: DC

## 2024-05-21 NOTE — Telephone Encounter (Signed)
 No show #1 for PT, called and spoke to patient who thought apt scheduled for tomorrow. Pt stated he plans to go to ER as concerned with blood clot. Therapist urged to go to ER is suspect. Reminded next apt scheduled and encouraged to call and cancel/reschedule if unable to make it to future apts.   Minor Amble, LPTA/CLT; Johnye Napoleon [phone]

## 2024-05-21 NOTE — Discharge Instructions (Addendum)
 You may apply the Voltaren  gel to the area for pain and inflammation as needed, use warm Epsom salt soaks, massage, stretches.  Follow-up if worsening or unresolving

## 2024-05-21 NOTE — ED Provider Notes (Signed)
 [facility]    CSN: [id number] Arrival date & time: 05/21/24  1442      History   Chief Complaint Chief Complaint  Patient presents with   Leg Pain    HPI Nicholas Delacruz is a 58 y.o. male.   Patient presenting today with several day history of right anterior lower leg pain after doing a lot of physical activity this weekend.  He denies swelling, discoloration, decreased range of motion, numbness, tingling, weakness.  States the pain is worse when he tries to flex his foot.  Denies associated chest pain, shortness of breath, palpitations, dizziness.  Not trying anything over-the-counter for symptoms.    Past Medical History:  Diagnosis Date   Arthritis    Diabetes mellitus without complication (HCC)    GERD (gastroesophageal reflux disease)    High blood pressure    Obstructive sleep apnea on CPAP    BIPAP   PONV (postoperative nausea and vomiting)    blood pressure dropped in recovery   Primary localized osteoarthritis of left knee 03/27/2018    Patient Active Problem List   Diagnosis Date Noted   Altered mental status 03/28/2024   Confusion 03/28/2024   Normocytic anemia 03/28/2024   Thrombocytosis 03/28/2024   Hypokalemia 03/28/2024   Hypoalbuminemia due to protein-calorie malnutrition (HCC) 03/28/2024   Type 2 diabetes mellitus with hyperglycemia (HCC) 03/28/2024   Drug abuse (HCC) 03/28/2024   Primary localized osteoarthritis of left knee 03/27/2018   S/P total knee arthroplasty 08/30/2016   Weakness of left leg 03/13/2014   Difficulty walking 03/13/2014   Arthritis of knee, left 01/21/2014   Cervicalgia 11/08/2011   Rheumatoid arthritis (HCC) 02/01/2011   RUPTURE ROTATOR CUFF 02/01/2011   IMPINGEMENT SYNDROME 12/07/2010   Primary localized osteoarthritis of right knee 09/28/2009   Essential hypertension 05/13/2009    Past Surgical History:  Procedure Laterality Date   APPENDECTOMY     KNEE ARTHROPLASTY     KNEE ARTHROSCOPY Right 01/02/2015    Procedure: RIGHT KNEE ARTHROSCOPY, LATERAL AND MEDIAL MENISECTOMY;  Surgeon: Darrin Emerald, MD;  Location: [facility];  Service: Orthopedics;  Laterality: Right;   KNEE ARTHROSCOPY WITH MEDIAL MENISECTOMY Left 06/25/2014   Procedure: KNEE ARTHROSCOPY WITH MEDIAL MENISECTOMY;  Surgeon: Darrin Emerald, MD;  Location: [facility];  Service: Orthopedics;  Laterality: Left;   KNEE SURGERY Left    MENISCUS DEBRIDEMENT Right 01/02/2015   Procedure: DEBRIDEMENT OF RIGHT KNEE JOINT;  Surgeon: Darrin Emerald, MD;  Location: [facility];  Service: Orthopedics;  Laterality: Right;   PARTIAL KNEE ARTHROPLASTY Left 03/27/2018   Procedure: UNICOMPARTMENTAL LEFT KNEE;  Surgeon: Osa Blase, MD;  Location: [facility];  Service: Orthopedics;  Laterality: Left;   right knee sark     SHOULDER SURGERY     left   TOTAL KNEE ARTHROPLASTY Right 08/30/2016   Procedure: TOTAL KNEE ARTHROPLASTY;  Surgeon: Osa Blase, MD;  Location: [facility];  Service: Orthopedics;  Laterality: Right;   TOTAL SHOULDER ARTHROPLASTY         Home Medications    Prior to Admission medications   Medication Sig Start Date End Date Taking? Authorizing Provider  diclofenac  Sodium (VOLTAREN  ARTHRITIS PAIN) 1 % GEL Apply 2 g topically 4 (four) times daily. 05/21/24  Yes Corbin Dess, PA-C  aspirin  EC 81 MG tablet Take 1 tablet (81 mg total) by mouth daily. Swallow whole. 03/28/24 03/28/25  Mason Sole, Pratik D, DO  atorvastatin  (LIPITOR) 80 MG tablet Take 1 tablet (80 mg total)  by mouth daily. 03/29/24 04/28/24  Mason Sole, Pratik D, DO  diclofenac  (CATAFLAM) 50 MG tablet Take 50 mg by mouth 3 (three) times daily as needed. 01/08/24   [provider]  diclofenac  (VOLTAREN ) 75 MG EC tablet Take 75 mg by mouth 2 (two) times daily as needed. 01/17/24   [provider]  diltiazem  (CARDIZEM  CD) 300 MG 24 hr capsule Take 300 mg by mouth at bedtime.  05/25/16   [provider]  escitalopram (LEXAPRO) 20 MG tablet Take 20 mg by mouth daily.  03/28/24   [provider]  fluticasone  (FLONASE ) 50 MCG/ACT nasal spray Place 2 sprays into both nostrils daily. 02/14/24   [provider]  gabapentin  (NEURONTIN ) 300 MG capsule Take 300 mg by mouth 3 (three) times daily. 02/24/24   [provider]  HYDROcodone -acetaminophen  (NORCO) 10-325 MG tablet Take 1 tablet by mouth every 4 (four) hours. 02/26/24   [provider]  ibuprofen  (ADVIL ) 800 MG tablet Take 800 mg by mouth 3 (three) times daily as needed. 02/28/24   [provider]  labetalol  (NORMODYNE ) 300 MG tablet Take 300 mg by mouth 2 (two) times daily. 03/21/24   [provider]  losartan -hydrochlorothiazide  (HYZAAR) 100-25 MG tablet Take 1 tablet by mouth daily.  09/04/17   [provider]  metFORMIN  (GLUCOPHAGE ) 1000 MG tablet Take 1,000 mg by mouth 2 (two) times daily. 03/04/24   [provider]  mupirocin  ointment (BACTROBAN ) 2 % Apply 1 Application topically 2 (two) times daily. 08/04/23   Corbin Dess, PA-C  omeprazole (PRILOSEC) 40 MG capsule Take 40 mg by mouth daily.  09/29/17   [provider]  ondansetron  (ZOFRAN ) 4 MG tablet Take 1 tablet (4 mg total) by mouth every 8 (eight) hours as needed for nausea or vomiting. 03/27/18   Osa Blase, MD  triamcinolone  cream (KENALOG ) 0.1 % Apply 1 Application topically 2 (two) times daily. 03/19/24   [provider]  zolpidem  (AMBIEN ) 10 MG tablet Take 10 mg by mouth at bedtime.     [provider]    Family History Family History  Problem Relation Age of Onset   Alzheimer's disease Father    Arthritis Unknown     Social History Social History   Tobacco Use   Smoking status: Never   Smokeless tobacco: Never  Vaping Use   Vaping status: Never Used  Substance Use Topics   Alcohol use: Yes    Comment: socially   Drug use: No     Allergies   Levofloxacin   Review of Systems Review of Systems Per HPI  Physical Exam Triage  Vital Signs ED Triage Vitals  Encounter Vitals Group     BP 05/21/24 1559 (!) 177/96     Systolic BP Percentile --      Diastolic BP Percentile --      Pulse Rate 05/21/24 1559 61     Resp 05/21/24 1559 20     Temp 05/21/24 1559 98.1 F (36.7 C)     Temp Source 05/21/24 1559 Oral     SpO2 05/21/24 1559 96 %     Weight --      Height --      Head Circumference --      Peak Flow --      Pain Score 05/21/24 1557 5     Pain Loc --      Pain Education --      Exclude from Growth Chart --  No data found.  Updated Vital Signs BP (!) 177/96 (BP Location: Right Arm)   Pulse 61   Temp 98.1 F (36.7 C) (Oral)   Resp 20   SpO2 96%   Visual Acuity Right Eye Distance:   Left Eye Distance:   Bilateral Distance:    Right Eye Near:   Left Eye Near:    Bilateral Near:     Physical Exam Vitals and nursing note reviewed.  Constitutional:      Appearance: Normal appearance.  HENT:     Head: Atraumatic.  Eyes:     Extraocular Movements: Extraocular movements intact.     Conjunctiva/sclera: Conjunctivae normal.  Cardiovascular:     Rate and Rhythm: Normal rate and regular rhythm.  Pulmonary:     Effort: Pulmonary effort is normal.     Breath sounds: Normal breath sounds.  Musculoskeletal:        General: Tenderness present. No swelling, deformity or signs of injury. Normal range of motion.     Cervical back: Normal range of motion and neck supple.     Comments: Right anterior lower leg tender to palpation particularly distally, worse with flexion of foot.  No posterior calf tenderness to palpation, edema, discoloration and negative Homans' sign, squeeze test  Skin:    General: Skin is warm and dry.     Findings: No bruising or erythema.  Neurological:     General: No focal deficit present.     Mental Status: He is oriented to person, place, and time.     Motor: No weakness.     Gait: Gait normal.     Comments: Right lower extremity neurovascularly intact  Psychiatric:         Mood and Affect: Mood normal.        Thought Content: Thought content normal.        Judgment: Judgment normal.      UC Treatments / Results  Labs (all labs ordered are listed, but only abnormal results are displayed) Labs Reviewed - No data to display  EKG   Radiology No results found.  Procedures Procedures (including critical care time)  Medications Ordered in UC Medications - No data to display  Initial Impression / Assessment and Plan / UC Course  I have reviewed the triage vital signs and the nursing notes.  Pertinent labs & imaging results that were available during my care of the patient were reviewed by me and considered in my medical decision making (see chart for details).     Suspect muscular strain of the anterior right lower leg.  Very low suspicion for DVT today so we will forego DVT venous ultrasound with shared decision making.  Treat with Voltaren  gel, massage, stretches, Epsom salt soaks and PCP follow-up.  Return for any worsening symptoms.  Final Clinical Impressions(s) / UC Diagnoses   Final diagnoses:  Right leg pain     Discharge Instructions      You may apply the Voltaren  gel to the area for pain and inflammation as needed, use warm Epsom salt soaks, massage, stretches.  Follow-up if worsening or unresolving  ED Prescriptions     Medication Sig Dispense Auth. Provider   diclofenac  Sodium (VOLTAREN  ARTHRITIS PAIN) 1 % GEL Apply 2 g topically 4 (four) times daily. 150 g Corbin Dess, [place]      PDMP not reviewed this encounter.   Corbin Dess, PA-C 05/21/24 1643

## 2024-05-21 NOTE — ED Triage Notes (Addendum)
 Pt reports right lower leg pain since walking up steps this weekend. Pt reports had stroke x5 weeks ago and is concerned for possible blood clot in leg. Denies being on blood thinner at this time.

## 2024-05-29 ENCOUNTER — Other Ambulatory Visit (HOSPITAL_COMMUNITY): Payer: Self-pay | Admitting: Internal Medicine

## 2024-05-29 ENCOUNTER — Ambulatory Visit (HOSPITAL_COMMUNITY)

## 2024-05-29 ENCOUNTER — Telehealth: Payer: Self-pay

## 2024-05-29 DIAGNOSIS — M79604 Pain in right leg: Secondary | ICD-10-CM

## 2024-05-29 NOTE — Telephone Encounter (Signed)
 Patient discharged from hospital on 4/27.  Due to scheduling and communication error on our part, he was turned away at his May establish visit due to arriving at wrong location.  Patient requested to come to [facility] to establish his care moving forward.    Dr. Renna Cary reviewed his schedule for service recovery and can work patient in on 06/12/24 at 840am.  Sending to scheduling team to get placed on the schedule for that day and time and confirm with patient.   Thanks to all involved.

## 2024-05-29 NOTE — Telephone Encounter (Signed)
 Patient has been added to the schedule.  Patient has been made aware.  Instructions given and address for new building.

## 2024-06-03 ENCOUNTER — Ambulatory Visit (HOSPITAL_COMMUNITY)
Admission: RE | Admit: 2024-06-03 | Discharge: 2024-06-03 | Disposition: A | Discharge status: Home / Self Care | Source: Ambulatory Visit | Attending: Internal Medicine | Admitting: Internal Medicine

## 2024-06-03 DIAGNOSIS — M79604 Pain in right leg: Secondary | ICD-10-CM | POA: Diagnosis present

## 2024-06-05 ENCOUNTER — Ambulatory Visit: Admitting: Diagnostic Neuroimaging

## 2024-06-05 ENCOUNTER — Encounter: Payer: Self-pay | Admitting: Diagnostic Neuroimaging

## 2024-06-05 VITALS — BP 178/82 | HR 62 | Ht 72.0 in | Wt 239.0 lb

## 2024-06-05 DIAGNOSIS — E1165 Type 2 diabetes mellitus with hyperglycemia: Secondary | ICD-10-CM | POA: Diagnosis not present

## 2024-06-05 DIAGNOSIS — E785 Hyperlipidemia, unspecified: Secondary | ICD-10-CM

## 2024-06-05 DIAGNOSIS — I429 Cardiomyopathy, unspecified: Secondary | ICD-10-CM

## 2024-06-05 DIAGNOSIS — I63412 Cerebral infarction due to embolism of left middle cerebral artery: Secondary | ICD-10-CM

## 2024-06-05 DIAGNOSIS — I63112 Cerebral infarction due to embolism of left vertebral artery: Secondary | ICD-10-CM

## 2024-06-05 DIAGNOSIS — I10 Essential (primary) hypertension: Secondary | ICD-10-CM | POA: Diagnosis not present

## 2024-06-05 MED ORDER — CLOPIDOGREL BISULFATE 75 MG PO TABS
75.0000 mg | ORAL_TABLET | Freq: Every day | ORAL | 0 refills | Status: DC
Start: 2024-06-05 — End: 2024-07-25

## 2024-06-05 NOTE — Patient Instructions (Addendum)
  LEFT corona radiata ischemic infarction (03/28/24; chronic left cerebellar infarction) - aspirin  81 + plavix  75 x 3 months, then aspirin  81 alone (patient completed 21 days; will renew for 2 more months of clopidogrel  to complete ~3 months) - continue atorvastatin  80 - continue diabetes control - improved BP control (172/82 today) - follow up with PCP and cardiology - recommend long term implanted loop recorder (rule out afib)

## 2024-06-05 NOTE — Progress Notes (Signed)
 [facility]  PATIENT: Nicholas Delacruz DOB: 05/04/1966  REFERRING CLINICIAN: Arleene Lack, MD HISTORY FROM: patient  REASON FOR VISIT: new consult   HISTORICAL  CHIEF COMPLAINT:  Chief Complaint  Patient presents with   Follow-up    Pt alone, rm 6 states that he is following up from hospital. Has difficulty with getting words out. Denied physical diabilities. He has regained strength. States he has come a long way with thinking but still has difficulty    HISTORY OF PRESENT ILLNESS:   UPDATE (06/05/24, VRP): 58 year old male here for evaluation of stroke follow-up.  Patient presented to hospital/2/25 due to onset of 2 days of confusion, word finding difficulty.  Was found to have acute left basal corona radiata ischemic infarction.  Stroke workup was completed.  He was managed medically.  He was discharged home the next day.  Since that time he is doing well.  He is tolerating medications.  He feels like he is getting better day by day.  Still has some mild word finding and spelling difficulties.  His right side feels stronger.  PRIOR HPI (03/28/24 discharge summary, Dr. Hartley Line): 58 y.o. male with medical history significant of hypertension, T2DM, OSA on CPAP, GERD who presents to the emergency department from home via EMS due to 2-day onset of confusion.  Patient states that he feels like he has difficulty in being able to get his words out, despite knowing what he wants to say.  Patient was admitted for acute CVA confirmed on imaging.  He was seen by neurology and had CTA head and neck with no LVO noted.  He has had 2D echocardiogram with findings of some cardiomyopathy with LVEF 40-45% and some diastolic dysfunction with global hypokinesis.  He is noted to have history of cocaine use and this is likely related.  He will need close follow-up with cardiology outpatient and will remain on aspirin  and Plavix  as prescribed as well as atorvastatin  and follow-up with  neurology in the next 3 months with referral sent.  He will also go home with cardiac monitor.  He was seen by PT with recommendations for outpatient PT services.  No other acute events or concerns noted.   REVIEW OF SYSTEMS: Full 14 system review of systems performed and negative with exception of: as per HPI.  ALLERGIES: Allergies  Allergen Reactions   Levofloxacin Anaphylaxis    HOME MEDICATIONS: Outpatient Medications Prior to Visit  Medication Sig Dispense Refill   aspirin  EC 81 MG tablet Take 1 tablet (81 mg total) by mouth daily. Swallow whole. (Patient taking differently: Take 325 mg by mouth daily. Swallow whole.) 150 tablet 2   diltiazem  (CARDIZEM  CD) 300 MG 24 hr capsule Take 300 mg by mouth at bedtime.      escitalopram (LEXAPRO) 20 MG tablet Take 20 mg by mouth daily.     fluticasone  (FLONASE ) 50 MCG/ACT nasal spray Place 2 sprays into both nostrils daily.     HYDROcodone -acetaminophen  (NORCO) 10-325 MG tablet Take 1 tablet by mouth every 4 (four) hours.     ibuprofen  (ADVIL ) 800 MG tablet Take 800 mg by mouth 3 (three) times daily as needed.     labetalol  (NORMODYNE ) 300 MG tablet Take 300 mg by mouth 2 (two) times daily.     losartan -hydrochlorothiazide  (HYZAAR) 100-25 MG tablet Take 1 tablet by mouth daily.      metFORMIN  (GLUCOPHAGE ) 1000 MG tablet Take 500 mg by mouth 2 (two) times daily.  mupirocin  ointment (BACTROBAN ) 2 % Apply 1 Application topically 2 (two) times daily. 22 g 0   omeprazole (PRILOSEC) 40 MG capsule Take 40 mg by mouth daily.      ondansetron  (ZOFRAN ) 4 MG tablet Take 1 tablet (4 mg total) by mouth every 8 (eight) hours as needed for nausea or vomiting. 10 tablet 0   zolpidem  (AMBIEN ) 10 MG tablet Take 10 mg by mouth at bedtime.      atorvastatin  (LIPITOR) 80 MG tablet Take 1 tablet (80 mg total) by mouth daily. 30 tablet 2   diclofenac  (CATAFLAM) 50 MG tablet Take 50 mg by mouth 3 (three) times daily as needed.     diclofenac  (VOLTAREN ) 75 MG EC  tablet Take 75 mg by mouth 2 (two) times daily as needed.     diclofenac  Sodium (VOLTAREN  ARTHRITIS PAIN) 1 % GEL Apply 2 g topically 4 (four) times daily. 150 g 0   gabapentin  (NEURONTIN ) 300 MG capsule Take 300 mg by mouth 3 (three) times daily.     triamcinolone  cream (KENALOG ) 0.1 % Apply 1 Application topically 2 (two) times daily.     No facility-administered medications prior to visit.    PAST MEDICAL HISTORY: Past Medical History:  Diagnosis Date   Arthritis    Diabetes mellitus without complication (HCC)    GERD (gastroesophageal reflux disease)    High blood pressure    Obstructive sleep apnea on CPAP    BIPAP   PONV (postoperative nausea and vomiting)    blood pressure dropped in recovery   Primary localized osteoarthritis of left knee 03/27/2018    PAST SURGICAL HISTORY: Past Surgical History:  Procedure Laterality Date   APPENDECTOMY     KNEE ARTHROPLASTY     KNEE ARTHROSCOPY Right 01/02/2015   Procedure: RIGHT KNEE ARTHROSCOPY, LATERAL AND MEDIAL MENISECTOMY;  Surgeon: Darrin Emerald, MD;  Location: [facility];  Service: Orthopedics;  Laterality: Right;   KNEE ARTHROSCOPY WITH MEDIAL MENISECTOMY Left 06/25/2014   Procedure: KNEE ARTHROSCOPY WITH MEDIAL MENISECTOMY;  Surgeon: Darrin Emerald, MD;  Location: [facility];  Service: Orthopedics;  Laterality: Left;   KNEE SURGERY Left    MENISCUS DEBRIDEMENT Right 01/02/2015   Procedure: DEBRIDEMENT OF RIGHT KNEE JOINT;  Surgeon: Darrin Emerald, MD;  Location: [facility];  Service: Orthopedics;  Laterality: Right;   PARTIAL KNEE ARTHROPLASTY Left 03/27/2018   Procedure: UNICOMPARTMENTAL LEFT KNEE;  Surgeon: Osa Blase, MD;  Location: [facility];  Service: Orthopedics;  Laterality: Left;   right knee sark     SHOULDER SURGERY     left   TOTAL KNEE ARTHROPLASTY Right 08/30/2016   Procedure: TOTAL KNEE ARTHROPLASTY;  Surgeon: Osa Blase, MD;  Location: [facility];  Service: Orthopedics;  Laterality: Right;   TOTAL SHOULDER ARTHROPLASTY       FAMILY HISTORY: Family History  Problem Relation Age of Onset   Alzheimer's disease Father    Arthritis Unknown     SOCIAL HISTORY: Social History   Socioeconomic History   Marital status: Divorced    Spouse name: Not on file   Number of children: Not on file   Years of education: 69 th grad   Highest education level: Not on file  Occupational History   Occupation: [employer]  Tobacco Use   Smoking status: Never   Smokeless tobacco: Never  Vaping Use   Vaping status: Never Used  Substance and Sexual Activity   Alcohol use: Yes    Comment: socially   Drug  use: No   Sexual activity: Yes    Birth control/protection: None  Other Topics Concern   Not on file  Social History Narrative   Not on file   Social Drivers of Health   Financial Resource Strain: Not on file  Food Insecurity: No Food Insecurity (03/28/2024)   Hunger Vital Sign    Worried About Running Out of Food in the Last Year: Never true    Ran Out of Food in the Last Year: Never true  Transportation Needs: No Transportation Needs (03/28/2024)   PRAPARE - Administrator, Civil Service (Medical): No    Lack of Transportation (Non-Medical): No  Physical Activity: Not on file  Stress: Not on file  Social Connections: Unknown (03/28/2024)   Social Connection and Isolation Panel [NHANES]    Frequency of Communication with Friends and Family: Not on file    Frequency of Social Gatherings with Friends and Family: Not on file    Attends Religious Services: Not on file    Active Member of Clubs or Organizations: Not on file    Attends Banker Meetings: Not on file    Marital Status: Divorced  Intimate Partner Violence: Not At Risk (03/28/2024)   Humiliation, Afraid, Rape, and Kick questionnaire    Fear of Current or Ex-Partner: No    Emotionally Abused: No    Physically Abused: No    Sexually Abused: No     PHYSICAL EXAM  GENERAL EXAM/CONSTITUTIONAL: Vitals:  Vitals:    06/05/24 1026  BP: (!) 178/82  Pulse: 62  Weight: 239 lb (108.4 kg)  Height: 6' (1.829 m)   Body mass index is 32.41 kg/m. Wt Readings from Last 3 Encounters:  06/05/24 239 lb (108.4 kg)  03/28/24 231 lb 7.7 oz (105 kg)  11/09/22 275 lb (124.7 kg)   Patient is in no distress; well developed, nourished and groomed; neck is supple  CARDIOVASCULAR: Examination of carotid arteries is normal; no carotid bruits Regular rate and rhythm, no murmurs Examination of peripheral vascular system by observation and palpation is normal  EYES: Ophthalmoscopic exam of optic discs and posterior segments is normal; no papilledema or hemorrhages No results found.  MUSCULOSKELETAL: Gait, strength, tone, movements noted in Neurologic exam below  NEUROLOGIC: MENTAL STATUS:      No data to display         awake, alert, oriented to person, place and time recent and remote memory intact normal attention and concentration language fluent, comprehension intact, naming intact; MILD WORD FINDING DIFF; MILD PARAPHASIC ERRORS (PENCIL INSTEAD OF PEN; STAPLER INSTEAD OF STAPLE) fund of knowledge appropriate  CRANIAL NERVE:  2nd - no papilledema on fundoscopic exam 2nd, 3rd, 4th, 6th - pupils equal and reactive to light, visual fields full to confrontation, extraocular muscles intact, no nystagmus 5th - facial sensation symmetric 7th - facial strength symmetric 8th - hearing intact 9th - palate elevates symmetrically, uvula midline 11th - shoulder shrug symmetric 12th - tongue protrusion midline  MOTOR:  normal bulk and tone, full strength in the BUE, BLE  SENSORY:  normal and symmetric to light touch, temperature, vibration  COORDINATION:  finger-nose-finger, fine finger movements normal  REFLEXES:  deep tendon reflexes TRACE and symmetric  GAIT/STATION:  narrow based gait     DIAGNOSTIC DATA (LABS, IMAGING, TESTING) - I reviewed patient records, labs, notes, testing and imaging  myself where available.  Lab Results  Component Value Date   WBC 10.3 03/28/2024   HGB 11.0 (L)  03/28/2024   HCT 35.1 (L) 03/28/2024   MCV 82.0 03/28/2024   PLT 474 (H) 03/28/2024      Component Value Date/Time   NA 136 03/28/2024 0055   K 3.3 (L) 03/28/2024 0055   CL 100 03/28/2024 0055   CO2 26 03/28/2024 0055   GLUCOSE 173 (H) 03/28/2024 0055   BUN 11 03/28/2024 0055   CREATININE 0.69 03/28/2024 0055   CALCIUM  9.3 03/28/2024 0055   PROT 7.2 03/28/2024 0055   ALBUMIN 3.4 (L) 03/28/2024 0055   AST 13 (L) 03/28/2024 0055   ALT 15 03/28/2024 0055   ALKPHOS 51 03/28/2024 0055   BILITOT 0.6 03/28/2024 0055   GFRNONAA >60 03/28/2024 0055   GFRAA >60 03/28/2018 0516   Lab Results  Component Value Date   CHOL 196 03/28/2024   HDL 43 03/28/2024   LDLCALC 124 (H) 03/28/2024   TRIG 145 03/28/2024   CHOLHDL 4.6 03/28/2024   Lab Results  Component Value Date   HGBA1C 8.5 (H) 03/28/2024   No results found for: VITAMINB12 No results found for: TSH  03/28/24 CTA head / neck 1. Negative for large vessel occlusion. But Positive for extensive atherosclerosis in the Head and Neck with significant stenoses: - Moderate stenosis Left ICA siphon. - Moderate stenosis of the dominant Right Vertebral V4 segment. - up to Severe stenosis of bilateral PCA P1/P2 segments.   2.  Aortic Atherosclerosis (ICD10-I70.0).   3. Bulky cervical and upper thoracic spinal degeneration.   03/28/24 MRI brain [I reviewed images myself and agree with interpretation. 1.6cm acute infarct in the left corona radiata. Chronic infarct in the left inferior cerebellum. -VRP]  1. Acute finding is a Left hemisphere lacunar infarct involving the left corona radiata and lentiform. No associated hemorrhage or mass effect. 2. Otherwise chronic white matter disease and a chronic left cerebellum PICA territory infarct.  03/28/24 TTE 1. Left ventricular ejection fraction, by estimation, is 40 to 45%. The  left  ventricle has mildly decreased function. The left ventricle  demonstrates global hypokinesis. There is moderate left ventricular  hypertrophy. Left ventricular diastolic  parameters are consistent with Grade I diastolic dysfunction (impaired  relaxation).   2. Right ventricular systolic function is normal. The right ventricular  size is normal.   3. Left atrial size was moderately dilated.   4. Right atrial size was mildly dilated.   5. The mitral valve is normal in structure. Trivial mitral valve  regurgitation. No evidence of mitral stenosis.   6. The aortic valve was not well visualized. Aortic valve regurgitation  is not visualized. No aortic stenosis is present.   7. The inferior vena cava is normal in size with greater than 50%  respiratory variability, suggesting right atrial pressure of 3 mmHg.     30 day cardiac monitor   30 day monitor. Data available from 70% of planned monitored time   Min HR 43, Max HR 100, avg HR 61   Rare supraventricular ectopy   Rare ventricular ectopy   Symptoms correlated with sinus rhythm with rare PACs and PVCs    ASSESSMENT AND PLAN  58 y.o. year old male here with:  Dx:  1. Cerebrovascular accident (CVA) due to embolism of left middle cerebral artery (HCC)   2. Cerebrovascular accident (CVA) due to embolism of left vertebral artery (HCC)   3. Essential hypertension   4. Type 2 diabetes mellitus with hyperglycemia, without long-term current use of insulin  (HCC)   5. Cardiomyopathy, unspecified type (HCC)  6. Hyperlipidemia, unspecified hyperlipidemia type     PLAN:  LEFT corona radiata ischemic infarction (03/28/24;could be due to artery-artery embolism from left ICA stenosis; also with chronic left cerebellar infarction, raises concern for underlying cardioembolic source) - aspirin  81 + plavix  75 x 3 months, then aspirin  81 alone (patient completed 21 days; will renew for 2 more months of clopidogrel  to complete ~3 months) - continue  atorvastatin  80 - continue diabetes control - improved BP control (172/82 today) - follow up with PCP and cardiology - recommend long term implanted loop recorder (rule out afib)  Orders Placed This Encounter  Procedures   Ambulatory referral to Cardiac Electrophysiology   Meds ordered this encounter  Medications   clopidogrel  (PLAVIX ) 75 MG tablet    Sig: Take 1 tablet (75 mg total) by mouth daily.    Dispense:  60 tablet    Refill:  0   Return for return to PCP, pending if symptoms worsen or fail to improve.  I reviewed images, labs, notes, records myself. I summarized findings and reviewed with patient, for this high risk condition (embolic strokes) requiring high complexity decision making.    Omega Bible, MD 06/05/2024, 11:06 AM Certified in Neurology, Neurophysiology and Neuroimaging  [facility] [address] [place], [place] [address] [phone]

## 2024-06-11 ENCOUNTER — Encounter (HOSPITAL_COMMUNITY): Admitting: Speech Pathology

## 2024-06-12 ENCOUNTER — Ambulatory Visit: Admitting: Cardiology

## 2024-06-18 ENCOUNTER — Encounter (HOSPITAL_COMMUNITY): Payer: Self-pay | Admitting: Speech Pathology

## 2024-06-19 ENCOUNTER — Telehealth (HOSPITAL_COMMUNITY): Payer: Self-pay

## 2024-06-19 ENCOUNTER — Encounter (HOSPITAL_COMMUNITY)

## 2024-06-19 NOTE — Therapy (Signed)
 PHYSICAL THERAPY DISCHARGE SUMMARY  Visits from Start of Care: 3  Current functional level related to goals / functional outcomes: See last visit on 05/15/24   Remaining deficits: See last visit on 05/15/24   Education / Equipment: See last visit on 05/15/24   Patient agrees to discharge. Patient goals were not met. Patient is being discharged due to not returning since the last visit. Patient requests to be discharged on 06/19/24, after two no show  6:17 PM, 06/19/24 Rosaria Settler, PT, DPT [facility] [place]

## 2024-06-19 NOTE — Telephone Encounter (Signed)
 2nd no show, called and spoke to pt who stated he has a lot going on right now. Stated his mother now in [place] center following fracture. Pt wishes to be DC'd. All remaining apts deleted.   Augustin Mclean, LPTA/CLT; WILLAIM [phone]

## 2024-06-24 ENCOUNTER — Ambulatory Visit: Attending: Cardiology | Admitting: Cardiology

## 2024-06-24 ENCOUNTER — Ambulatory Visit: Admitting: Internal Medicine

## 2024-06-24 ENCOUNTER — Encounter: Payer: Self-pay | Admitting: Cardiology

## 2024-06-24 VITALS — BP 176/91 | HR 65 | Ht 72.0 in | Wt 246.0 lb

## 2024-06-24 DIAGNOSIS — E1165 Type 2 diabetes mellitus with hyperglycemia: Secondary | ICD-10-CM

## 2024-06-24 DIAGNOSIS — E782 Mixed hyperlipidemia: Secondary | ICD-10-CM | POA: Diagnosis not present

## 2024-06-24 DIAGNOSIS — I10 Essential (primary) hypertension: Secondary | ICD-10-CM | POA: Diagnosis not present

## 2024-06-24 DIAGNOSIS — I5020 Unspecified systolic (congestive) heart failure: Secondary | ICD-10-CM | POA: Diagnosis not present

## 2024-06-24 MED ORDER — HYDRALAZINE HCL 100 MG PO TABS: 100.0000 mg | ORAL_TABLET | Freq: Two times a day (BID) | ORAL | 3 refills | Status: AC

## 2024-06-24 MED ORDER — ATORVASTATIN CALCIUM 80 MG PO TABS: 80.0000 mg | ORAL_TABLET | Freq: Every day | ORAL | 3 refills | Status: AC

## 2024-06-24 NOTE — Progress Notes (Signed)
 Cardiology Office Note:  .   Date:  06/24/2024  ID:  Nicholas Delacruz, DOB 10-09-1966, MRN [id number] PCP: Marvine Rush, MD  [facility] Providers Cardiologist:  Newman Lawrence, MD PCP: Marvine Rush, MD  Chief Complaint  Patient presents with   Cardiomyopathy     Nicholas Delacruz is a 58 y.o. 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  Patient was hospitalized in 03/2024 with 2 days of confusion.  Workup showed acute left hemisphere lacunar infarct, without large vessel occlusion.  Echocardiogram showed mild reduced EF at 40-45% with global hypokinesis.  This was thought to be related to his history of cocaine abuse, found positive for cocaine testing during his hospitalization.  He admits to prior cocaine use, but is not using cocaine currently, does not smoke or drink regularly.  Blood pressure remains elevated.  He denies any complains of chest pain, shortness of breath.  He has no significant residual neurodeficit from the stroke, other than occasional memory related issues.   Vitals:   06/24/24 1047  BP: (!) 176/91  Pulse: 65  SpO2: 96%      Review of Systems  Cardiovascular:  Negative for chest pain, dyspnea on exertion, leg swelling, palpitations and syncope.        Studies Reviewed: SABRA        EKG 06/24/2024: Sinus bradycardia Left ventricular hypertrophy with repolarization abnormality ( Sokolow-Lyon ) When compared with ECG of 27-Mar-2024 23:50, No significant change was found    Labs 03/2024: Chol 196, TG 145, HDL 43, LDL 124 HbA1C 8.5% Hb 11.0 Cr 0.69  Monitor 03/2024:   30 day monitor. Data available from 70% of planned monitored time   Min HR 43, Max HR 100, avg HR 61   Rare supraventricular ectopy   Rare ventricular ectopy   Symptoms correlated with sinus  rhythm with rare PACs and PVCs  Echocardiogram 03/2024:  1. Left ventricular ejection fraction, by estimation, is 40 to 45%. The  left ventricle has mildly decreased function. The left ventricle  demonstrates global hypokinesis. There is moderate left ventricular  hypertrophy. Left ventricular diastolic  parameters are consistent with Grade I diastolic dysfunction (impaired  relaxation).   2. Right ventricular systolic function is normal. The right ventricular  size is normal.   3. Left atrial size was moderately dilated.   4. Right atrial size was mildly dilated.   5. The mitral valve is normal in structure. Trivial mitral valve  regurgitation. No evidence of mitral stenosis.   6. The aortic valve was not well visualized. Aortic valve regurgitation  is not visualized. No aortic stenosis is present.   7. The inferior vena cava is normal in size with greater than 50%  respiratory variability, suggesting right atrial pressure of 3 mmHg.   Brain MRI 03/2024: 1. Acute finding is a Left hemisphere lacunar infarct involving the left corona radiata and lentiform. No associated hemorrhage or mass effect.  2. Otherwise chronic white matter disease and a chronic left cerebellum PICA territory infarct.        Physical Exam Vitals and nursing note reviewed.  Constitutional:      General: He is not in acute distress. Neck:     Vascular: No JVD.   Cardiovascular:     Rate and Rhythm: Normal rate and regular rhythm.  Heart sounds: Normal heart sounds. No murmur heard. Pulmonary:     Effort: Pulmonary effort is normal.     Breath sounds: Normal breath sounds. No wheezing or rales.   Musculoskeletal:     Right lower leg: No edema.     Left lower leg: No edema.      VISIT DIAGNOSES:   ICD-10-CM   1. HFrEF (heart failure with reduced ejection fraction) (HCC)  I50.20 EKG 12-Lead    Basic metabolic panel with GFR    ECHOCARDIOGRAM COMPLETE    Basic metabolic panel with GFR    CT  CORONARY MORPH W/CTA COR W/SCORE W/CA W/CM &/OR WO/CM    2. Mixed hyperlipidemia  E78.2 Lipid panel    Lipid panel    3. Essential hypertension  I10 AMB Referral to [facility] Pharm-D    4. Type 2 diabetes mellitus with hyperglycemia, without long-term current use of insulin  [facility])  E11.65        Nicholas Delacruz is a 58 y.o. male with hypertension, hyperlipidemia, type 2 diabetes mellitus, cardiomyopathy EF 45 to 50%, h/o cocaine abuse, h/o stroke, OSA on CPAP, GERD Assessment & Plan  Cardiomyopathy: EF 40-45% without any signs of heart failure on exam.  This was noted in the setting of stroke, as well as presence of cocaine.  Will repeat echocardiogram next month.  If EF remains low, he may need optimal GDMT for HFrEF.  In the meantime, I will obtain coronary CT angiogram to evaluate for any obstructive CAD as etiology of his low EF.  Hypertension: Uncontrolled, currently on diltiazem  300 mg daily, labetalol  300 mg twice daily, losartan -hydrochlorothiazide  100-25 mg daily. Added hydralazine 100 mg twice daily. Recommend follow-up with Pharm.D. for hypertension management.  If blood pressure remains uncontrolled, he may need workup for secondary hypertension.  H/o stroke: Currently on aspirin  and Plavix , defer duration decision to neurology. Continue Lipitor 80 mg daily. Check lipid panel.  Type 2 diabetes mellitus: Continue follow-up with PCP and endocrinology. Goal A1c <7%.  H/o cocaine abuse: Strongly recommend complete cessation.       Meds ordered this encounter  Medications   hydrALAZINE (APRESOLINE) 100 MG tablet    Sig: Take 1 tablet (100 mg total) by mouth 2 (two) times daily.    Dispense:  180 tablet    Refill:  3   atorvastatin  (LIPITOR) 80 MG tablet    Sig: Take 1 tablet (80 mg total) by mouth daily.    Dispense:  90 tablet    Refill:  3     F/u in 3 months  Signed, Newman JINNY Lawrence, MD

## 2024-06-24 NOTE — Patient Instructions (Addendum)
 Medication Instructions:  START Hydralazine 100 mg twice daily *If you need a refill on your cardiac medications before your next appointment, please call your pharmacy*  Lab Work: BMP Lipid Panel   If you have labs (blood work) drawn today and your tests are completely normal, you will receive your results only by: MyChart Message (if you have MyChart) OR A paper copy in the mail If you have any lab test that is abnormal or we need to change your treatment, we will call you to review the results.  Testing/Procedures: ECHOCARDIOGRAM Your physician has requested that you have an echocardiogram. Echocardiography is a painless test that uses sound waves to create images of your heart. It provides your doctor with information about the size and shape of your heart and how well your heart's chambers and valves are working. This procedure takes approximately one hour. There are no restrictions for this procedure. Please do NOT wear cologne, perfume, aftershave, or lotions (deodorant is allowed). Please arrive 15 minutes prior to your appointment time.  Please note: We ask at that you not bring children with you during ultrasound (echo/ vascular) testing. Due to room size and safety concerns, children are not allowed in the ultrasound rooms during exams. Our front office staff cannot provide observation of children in our lobby area while testing is being conducted. An adult accompanying a patient to their appointment will only be allowed in the ultrasound room at the discretion of the ultrasound technician under special circumstances. We apologize for any inconvenience.    CORONARY CTA Cardiac CT Angiography (CTA), is a special type of CT scan that uses a computer to produce multi-dimensional views of major blood vessels throughout the body. In CT angiography, a contrast material is injected through an IV to help visualize the blood vessels.  Please see instruction sheet below (someone will call you  to schedule this once approval from insurance)  Follow-Up: At [facility], you and your health needs are our priority.  As part of our continuing mission to provide you with exceptional heart care, our providers are all part of one team.  This team includes your primary Cardiologist (physician) and Advanced Practice Providers or APPs (Physician Assistants and Nurse Practitioners) who all work together to provide you with the care you need, when you need it.  Your next appointment:   3 months  Provider:   Newman JINNY Lawrence, MD          Your cardiac CT will be scheduled at one of the below locations:   [facility] [address] [place], [place] [address] [phone]  OR  [facility] [address] [place], [place] [address] [phone]  OR   [facility] [address] [place], [place] [address] [phone]  OR   [facility] [address] [place], [place] [address] [phone]  OR   [facility] [address]  [place], [place] [address]  If scheduled at [facility], please arrive at the [facility] and Children's Entrance (Entrance C2) of [facility] 30 minutes prior to test start time. You can use the FREE valet parking offered at entrance C (encouraged to control the heart rate for the test)  Proceed to the [facility] Radiology Department (first floor) to check-in and test prep.   All radiology patients and guests should use entrance C2 at [facility], accessed from [place]  [employer], even though the hospital's physical address listed is [address]    If scheduled at the Heart and Vascular Tower at [address], please enter the parking lot using the [address] entrance and use the FREE valet service at the patient drop-off area. Enter the buidling and check-in with  registration on the main floor.  If scheduled at [facility] or [facility], please arrive 15 mins early for check-in and test prep.  There is spacious parking and easy access to the radiology department from the [facility] V[facility] entrance. Please enter here and check-in with the desk attendant.   If scheduled at [facility], please arrive 30 minutes early for check-in and test prep.  Please follow these instructions carefully (unless otherwise directed):  An IV will be required for this test and Nitroglycerin will be given.  Hold all erectile dysfunction medications at least 3 days (72 hrs) prior to test. (Ie viagra, cialis, sildenafil, tadalafil, etc)   On the Night Before the Test: Be sure to Drink plenty of water. Do not consume any caffeinated/decaffeinated beverages or chocolate 12 hours prior to your test. Do not take any antihistamines 12 hours prior to your test.  On the Day of the Test: Drink plenty of water until 1 hour prior to the test. Do not eat any food 1 hour prior to test. You may take your regular medications prior to the test.  If you take Furosemide/Hydrochlorothiazide /Spironolactone/Chlorthalidone, please HOLD on the morning of the test. Patients who wear a continuous glucose monitor MUST remove the device prior to scanning.       After the Test: Drink plenty of water. After receiving IV contrast, you may experience a mild flushed feeling. This is normal. On occasion, you may experience a mild rash up to 24 hours after the test. This is not dangerous. If this occurs, you can take Benadryl  25 mg, Zyrtec, Claritin, or Allegra and increase your fluid intake. (Patients taking Tikosyn should avoid Benadryl , and may take Zyrtec, Claritin, or Allegra) If you experience trouble breathing, this can be serious. If it is severe call 911 IMMEDIATELY. If it is mild, please call our office.  We will call to schedule  your test 2-4 weeks out understanding that some insurance companies will need an authorization prior to the service being performed.   For more information and frequently asked questions, please visit our website : [email or web address]  For non-scheduling related questions, please contact the cardiac imaging nurse navigator should you have any questions/concerns: Cardiac Imaging Nurse Navigators Direct Office Dial: [phone]   For scheduling needs, including cancellations and rescheduling, please call [place], [phone].

## 2024-06-25 ENCOUNTER — Encounter (HOSPITAL_COMMUNITY)

## 2024-07-11 ENCOUNTER — Telehealth: Payer: Self-pay | Admitting: Diagnostic Neuroimaging

## 2024-07-11 LAB — LIPID PANEL

## 2024-07-11 NOTE — Telephone Encounter (Signed)
 Pt called and stated that  he had lost his medication and needed to get a new order  clopidogrel  (PLAVIX ) 75 MG tablet   Pt would like medication  to be sent to   [facility] [place], [place] - [phone] (Ph: [phone])

## 2024-07-12 ENCOUNTER — Ambulatory Visit: Payer: Self-pay | Admitting: *Deleted

## 2024-07-12 DIAGNOSIS — I10 Essential (primary) hypertension: Secondary | ICD-10-CM

## 2024-07-12 DIAGNOSIS — I119 Hypertensive heart disease without heart failure: Secondary | ICD-10-CM

## 2024-07-12 LAB — LIPID PANEL
Chol/HDL Ratio: 3.2 ratio (ref 0.0–5.0)
Cholesterol, Total: 121 mg/dL (ref 100–199)
HDL: 38 mg/dL — ABNORMAL LOW (ref >39)
LDL Chol Calc (NIH): 67 mg/dL (ref 0–99)
Triglycerides: 81 mg/dL (ref 0–149)
VLDL Cholesterol Cal: 16 mg/dL (ref 5–40)

## 2024-07-12 LAB — BASIC METABOLIC PANEL WITH GFR
BUN/Creatinine Ratio: 8 — ABNORMAL LOW (ref 9–20)
BUN: 7 mg/dL (ref 6–24)
CO2: 22 mmol/L (ref 20–29)
Calcium: 9.7 mg/dL (ref 8.7–10.2)
Chloride: 101 mmol/L (ref 96–106)
Creatinine, Ser: 0.89 mg/dL (ref 0.76–1.27)
Glucose: 181 mg/dL — ABNORMAL HIGH (ref 70–99)
Potassium: 4.3 mmol/L (ref 3.5–5.2)
Sodium: 139 mmol/L (ref 134–144)
eGFR: 99 mL/min/1.73 (ref >59)

## 2024-07-15 ENCOUNTER — Ambulatory Visit: Payer: Self-pay | Admitting: Cardiology

## 2024-07-16 ENCOUNTER — Encounter (HOSPITAL_COMMUNITY): Payer: Self-pay

## 2024-07-17 ENCOUNTER — Telehealth (HOSPITAL_COMMUNITY): Payer: Self-pay | Admitting: Emergency Medicine

## 2024-07-17 NOTE — Telephone Encounter (Signed)
 Attempted to call patient regarding upcoming cardiac CT appointment. Left message on voicemail with name and callback number Rockwell Alexandria RN Navigator Cardiac Imaging [facility] [phone] Office [phone] Cell

## 2024-07-18 ENCOUNTER — Ambulatory Visit (HOSPITAL_COMMUNITY): Admission: RE | Admit: 2024-07-18 | Source: Ambulatory Visit

## 2024-07-25 ENCOUNTER — Other Ambulatory Visit: Payer: Self-pay

## 2024-07-25 MED ORDER — CLOPIDOGREL BISULFATE 75 MG PO TABS: 75.0000 mg | ORAL_TABLET | Freq: Every day | ORAL | 0 refills | Status: DC

## 2024-08-07 ENCOUNTER — Ambulatory Visit (HOSPITAL_COMMUNITY)
Admission: RE | Admit: 2024-08-07 | Discharge: 2024-08-07 | Disposition: A | Discharge status: Home / Self Care | Source: Ambulatory Visit | Attending: Cardiology | Admitting: Cardiology

## 2024-08-07 DIAGNOSIS — I5020 Unspecified systolic (congestive) heart failure: Secondary | ICD-10-CM | POA: Diagnosis present

## 2024-08-07 LAB — ECHOCARDIOGRAM COMPLETE
Area-P 1/2: 2.87 cm2
S' Lateral: 5.3 cm

## 2024-08-09 NOTE — Progress Notes (Signed)
 EF remains mildly reduced.  Awaiting coronary CT angiogram for coronary evaluation.  Currently on high-dose diltiazem , labetalol , losartan -hydrochlorothiazide  for hypertension management, which is hard for control.  I suspect this is hypertensive cardiomyopathy.  We could consider changing losartan  hydrochlorothiazide  to Entresto, and perhaps spironolactone +/- Jardiance.  Consider follow-up with Pharm.D. for medication management for hypertension and hypertensive cardiomyopathy.  Thanks MJP

## 2024-08-13 ENCOUNTER — Telehealth: Payer: Self-pay | Admitting: Cardiology

## 2024-08-13 NOTE — Telephone Encounter (Signed)
 Please review result note.

## 2024-08-13 NOTE — Telephone Encounter (Signed)
 Pt calling back in. Informed him, message was sent today, Brooke, RN will c/b as soon as she can and I will forward another message back.

## 2024-08-13 NOTE — Telephone Encounter (Signed)
Patient returned RN's call regarding test results.

## 2024-08-14 ENCOUNTER — Encounter (HOSPITAL_COMMUNITY): Payer: Self-pay

## 2024-08-16 ENCOUNTER — Ambulatory Visit: Admitting: Family Medicine

## 2024-08-16 ENCOUNTER — Telehealth: Payer: Self-pay

## 2024-08-16 NOTE — Telephone Encounter (Signed)
 Copied from CRM [id number]. Topic: Appointments - Appointment Scheduling >> Aug 16, 2024  9:51 AM Debby BROCKS wrote: Patient has a new patient visit with Dr. Duanne on 08/16/2024 @ 12pm but needs to reschedule. Dr. Duanne is set to no longer seeing new patients and cannot populate a reschedule date.

## 2024-08-23 ENCOUNTER — Telehealth (HOSPITAL_COMMUNITY): Payer: Self-pay | Admitting: Emergency Medicine

## 2024-08-23 NOTE — Telephone Encounter (Signed)
 Attempted to call patient regarding upcoming cardiac CT appointment. Left message on voicemail with name and callback number Rockwell Alexandria RN Navigator Cardiac Imaging [facility] [phone] Office [phone] Cell

## 2024-08-27 ENCOUNTER — Ambulatory Visit (HOSPITAL_COMMUNITY)
Admission: RE | Admit: 2024-08-27 | Discharge: 2024-08-27 | Disposition: A | Discharge status: Home / Self Care | Source: Ambulatory Visit | Attending: Cardiology | Admitting: Cardiology

## 2024-08-27 DIAGNOSIS — I2510 Atherosclerotic heart disease of native coronary artery without angina pectoris: Secondary | ICD-10-CM | POA: Diagnosis not present

## 2024-08-27 DIAGNOSIS — R931 Abnormal findings on diagnostic imaging of heart and coronary circulation: Secondary | ICD-10-CM | POA: Insufficient documentation

## 2024-08-27 DIAGNOSIS — I5020 Unspecified systolic (congestive) heart failure: Secondary | ICD-10-CM | POA: Insufficient documentation

## 2024-08-27 LAB — POCT I-STAT CREATININE: Creatinine, Ser: 0.8 mg/dL (ref 0.61–1.24)

## 2024-08-27 MED ORDER — NITROGLYCERIN 0.4 MG SL SUBL
0.8000 mg | SUBLINGUAL_TABLET | Freq: Once | SUBLINGUAL | Status: AC
  Administered 2024-08-27: 0.8 mg via SUBLINGUAL

## 2024-08-27 MED ORDER — IOHEXOL 350 MG/ML SOLN
100.0000 mL | Freq: Once | INTRAVENOUS | Status: AC | PRN
  Administered 2024-08-27: 100 mL via INTRAVENOUS

## 2024-08-28 ENCOUNTER — Ambulatory Visit: Payer: Self-pay | Admitting: Cardiology

## 2024-08-28 ENCOUNTER — Other Ambulatory Visit: Payer: Self-pay | Admitting: Cardiology

## 2024-08-28 ENCOUNTER — Ambulatory Visit (HOSPITAL_BASED_OUTPATIENT_CLINIC_OR_DEPARTMENT_OTHER)
Admission: RE | Admit: 2024-08-28 | Discharge: 2024-08-28 | Disposition: A | Discharge status: Home / Self Care | Source: Ambulatory Visit | Attending: Cardiology | Admitting: Cardiology

## 2024-08-28 DIAGNOSIS — R931 Abnormal findings on diagnostic imaging of heart and coronary circulation: Secondary | ICD-10-CM | POA: Diagnosis not present

## 2024-08-28 DIAGNOSIS — I2510 Atherosclerotic heart disease of native coronary artery without angina pectoris: Secondary | ICD-10-CM

## 2024-08-28 NOTE — Progress Notes (Signed)
 Based on Cor CTA findings, likely needs cath. Last seen on 6/30. Recommend in office visit with me or APP to discuss coronary angiogram and possible intervention.  Thanks MJP

## 2024-09-02 NOTE — Progress Notes (Signed)
 Happy early birthday to Nicholas Delacruz.  Based on coronary CT angiogram, obstructive coronary artery disease is suspected.  I recommend cardiac catheterization for further evaluation.  Consider getting him seen by someone in the office in the next few days and set up for heart catheterization thereafter.  Okay to keep 09/24/2024 appointment with me, which can serve as post cath follow-up.  Thanks MJP

## 2024-09-03 NOTE — Progress Notes (Signed)
 Noted.  Thanks MJP

## 2024-09-03 NOTE — Progress Notes (Signed)
 The patient has been notified of the result and verbalized understanding. All questions (if any) were answered. Appt scheduled to see Orren Fabry on 09/10/24 per pt request.

## 2024-09-10 ENCOUNTER — Ambulatory Visit: Attending: Physician Assistant | Admitting: Physician Assistant

## 2024-09-10 VITALS — BP 164/92 | HR 60 | Ht 72.0 in | Wt 243.0 lb

## 2024-09-10 DIAGNOSIS — I43 Cardiomyopathy in diseases classified elsewhere: Secondary | ICD-10-CM

## 2024-09-10 DIAGNOSIS — I10 Essential (primary) hypertension: Secondary | ICD-10-CM | POA: Diagnosis not present

## 2024-09-10 DIAGNOSIS — I119 Hypertensive heart disease without heart failure: Secondary | ICD-10-CM

## 2024-09-10 DIAGNOSIS — E1165 Type 2 diabetes mellitus with hyperglycemia: Secondary | ICD-10-CM

## 2024-09-10 DIAGNOSIS — I2510 Atherosclerotic heart disease of native coronary artery without angina pectoris: Secondary | ICD-10-CM

## 2024-09-10 DIAGNOSIS — I5020 Unspecified systolic (congestive) heart failure: Secondary | ICD-10-CM | POA: Diagnosis not present

## 2024-09-10 DIAGNOSIS — I639 Cerebral infarction, unspecified: Secondary | ICD-10-CM

## 2024-09-10 MED ORDER — NITROGLYCERIN 0.4 MG SL SUBL: 0.4000 mg | SUBLINGUAL_TABLET | SUBLINGUAL | 3 refills | Status: AC | PRN

## 2024-09-10 NOTE — Progress Notes (Signed)
 Cardiology Office Note   Date:  09/10/2024  ID:  [name], DOB 1966-10-31, MRN [id number] PCP: Marvine Rush, MD  Nicholas Delacruz Cardiologist:  Newman JINNY Lawrence, MD   History of Present Illness Nicholas Delacruz is a 58 y.o. male with HTN, HLD, type 2 DM, cardiomyopathy EF 45-50%, h/o cocaine abuse, h/o stoke, OSA on CPAP, and GERD here for follow-up appointment.   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  Hospitalized 03/2024 for 2 days with confusion.  Workup showed acute left hemisphere lacunar infarct without large vessel occlusion.  Echo showed mild reduction in LVEF 40 to 45% with global hypokinesis.  This was thought to be related to history of cocaine abuse and found to be positive for cocaine testing during this hospitalization.  Admits to prior cocaine use but is not using currently, does not smoke or drink regularly.  Blood pressure remains elevated.  Denies any complaints of chest pain, shortness of breath.  No significant residual neuro defect from the stroke other than occasional memory related issues.  Was last seen in the office 06/24/2024.  Since LVEF remains low we were trying to optimize GDMT for HFrEF.  Coronary CT angiogram was ordered to evaluate any obstructive CAD as etiology for his low EF.  On 08/28/2024 he underwent coronary CT angiogram which showed a lesion in the distal LCx that was positive for FFR.  Cardiac catheterization was recommended to further evaluate this lesion.  Today, he coronary artery disease who presents for evaluation of coronary artery stenosis.  He has significant stenosis in the left anterior descending (LAD) artery. A recent CT scan showed multiple areas of coronary artery stenosis: the left main artery with less than 25% stenosis, the LAD with two areas of 25-49% and 70-99% stenosis, and a branch off the LAD with 50-69% stenosis. The left circumflex artery had stenosis of 50-69% and 25-49%, and the right  coronary artery had 25-49% stenosis with a branch showing 50-69% stenosis. A distal LAD lesion is under evaluation with fractional flow reserve (FFR) testing.  He is on Lipitor, with an LDL level of 67 mg/dL as of July, and takes aspirin  and Plavix . He does not have nitroglycerin  at home. We have provided him with a prescription.   He frequently experiences nausea, which improves with food intake. No recent chest pain or shortness of breath. He denies recent cocaine use. Blood pressure is elevated, attributed to 'white coat syndrome'.  Creatinine level was normal at 0.8 mg/dL as of September 2.  Reports no shortness of breath nor dyspnea on exertion. Reports no chest pain, pressure, or tightness. No edema, orthopnea, PND. Reports no palpitations.   Discussed the use of AI scribe software for clinical note transcription with the patient, who gave verbal consent to proceed.   ROS: pertinent ROS in HPI  Studies Reviewed EKG Interpretation Date/Time:  Tuesday September 10 2024 15:18:19 EDT Ventricular Rate:  57 PR Interval:  122 QRS Duration:  98 QT Interval:  454 QTC Calculation: 441 R Axis:   29  Text Interpretation: Sinus bradycardia Left ventricular hypertrophy with repolarization abnormality ( R in aVL , Sokolow-Lyon , Romhilt-Estes ) Cannot rule out Inferior infarct , age undetermined When compared with ECG of 24-Jun-2024 11:13, Minimal criteria for Inferior infarct are now Present Inverted T waves have replaced nonspecific T wave abnormality in Inferior leads Confirmed by Lucien Blanc [phone]) on 09/10/2024 4:02:59 PM   CT coronary angiogram 08/28/2024 EXAM: FFRCT ANALYSIS  FINDINGS: FFRct analysis was performed on the original cardiac CT angiogram dataset. Diagrammatic representation of the FFRct analysis is provided in a separate PDF document in PACS. This dictation was created using the PDF document and an interactive 3D model of the results. 3D model is not available in the  EMR/PACS. Normal FFR range is >0.80.   1. Left Main: No significant stenosis. Distal FFR 0.99.   2. LAD: Possible significant stenosis.i Mid and distal LAD/D1 could not be modeled.   3. LCX: Possible significant stenosis. 0.94, 0.90 0.63   4. Ramus: Borderline significant stenosis. 0.98, 0.90,   0.80   5. RCA: No significant stenosis. Distal FFR 0.81.   IMPRESSION: 1. Coronary CTA FFR analysis demonstrates possible hemodynamically flow limiting lesion in the distal LCx that corresponds to lesion noted on CTA. The mid and distal LAD/D1 could not be modeled.   2.  Recommend cardiac catheterization.   Wilbert Bihari, MD     Electronically Signed   By: Wilbert Bihari M.D.   On: 08/28/2024 13:07      Physical Exam VS:  BP (!) 164/92   Pulse 60   Ht 6' (1.829 m)   Wt 243 lb (110.2 kg)   SpO2 97%   BMI 32.96 kg/m        Wt Readings from Last 3 Encounters:  09/10/24 243 lb (110.2 kg)  06/24/24 246 lb (111.6 kg)  06/05/24 239 lb (108.4 kg)    GEN: Well nourished, well developed in no acute distress NECK: No JVD; No carotid bruits CARDIAC: RRR, no murmurs, rubs, gallops RESPIRATORY:  Clear to auscultation without rales, wheezing or rhonchi  ABDOMEN: Soft, non-tender, non-distended EXTREMITIES:  No edema; No deformity   ASSESSMENT AND PLAN  Coronary artery disease with severe LAD stenosis Severe LAD stenosis (70-99%) confirmed by CT. Moderate stenosis in other arteries. Cardiac catheterization planned to evaluate stenting feasibility. Discussed revascularization options. Explained procedural risks: bleeding, dye allergy (0.5%), heart attack/stroke (0.1%). Normal kidney function reduces dye risk. Preference for Dr. Wonda. - Schedule cardiac catheterization with Dr. Wonda. - Prescribe nitroglycerin  tablets for emergency use. - EKG does show new ST depressions in lead II (d/w Dr. Anner) - Order full BMP and CBC.  Mixed hyperlipidemia LDL at 67 mg/dL in July, near  target <44 mg/dL. Lipitor regimen effective.  Essential hypertension Elevated blood pressure, likely due to white coat syndrome and anxiety.  Nausea Nausea likely related to anxiety and stress, improves with food. - Discussed lifestyle modifications, including small meals. -unlikely to be anginal equivalent     Informed Consent   Shared Decision Making/Informed Consent All outpatient stress tests require an informed consent ([id number]) ATTESTATION ORDER       :[id number]} The risks [stroke (1 in 1000), death (1 in 1000), kidney failure [usually temporary] (1 in 500), bleeding (1 in 200), allergic reaction [possibly serious] (1 in 200)], benefits (diagnostic support and management of coronary artery disease) and alternatives of a cardiac catheterization were discussed in detail with Mr. Rengel and he is willing to proceed.     Dispo: He can follow-up after cardiac cath  Signed, Orren LOISE Fabry, PA-C

## 2024-09-10 NOTE — H&P (View-Only) (Signed)
 Cardiology Office Note   Date:  09/10/2024  ID:  Nicholas Delacruz, DOB 1966-10-31, MRN [id number] PCP: Marvine Rush, MD  [facility] Providers Cardiologist:  Newman JINNY Lawrence, MD   History of Present Illness Nicholas Delacruz is a 58 y.o. male with HTN, HLD, type 2 DM, cardiomyopathy EF 45-50%, h/o cocaine abuse, h/o stoke, OSA on CPAP, and GERD here for follow-up appointment.   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  Hospitalized 03/2024 for 2 days with confusion.  Workup showed acute left hemisphere lacunar infarct without large vessel occlusion.  Echo showed mild reduction in LVEF 40 to 45% with global hypokinesis.  This was thought to be related to history of cocaine abuse and found to be positive for cocaine testing during this hospitalization.  Admits to prior cocaine use but is not using currently, does not smoke or drink regularly.  Blood pressure remains elevated.  Denies any complaints of chest pain, shortness of breath.  No significant residual neuro defect from the stroke other than occasional memory related issues.  Was last seen in the office 06/24/2024.  Since LVEF remains low we were trying to optimize GDMT for HFrEF.  Coronary CT angiogram was ordered to evaluate any obstructive CAD as etiology for his low EF.  On 08/28/2024 he underwent coronary CT angiogram which showed a lesion in the distal LCx that was positive for FFR.  Cardiac catheterization was recommended to further evaluate this lesion.  Today, he coronary artery disease who presents for evaluation of coronary artery stenosis.  He has significant stenosis in the left anterior descending (LAD) artery. A recent CT scan showed multiple areas of coronary artery stenosis: the left main artery with less than 25% stenosis, the LAD with two areas of 25-49% and 70-99% stenosis, and a branch off the LAD with 50-69% stenosis. The left circumflex artery had stenosis of 50-69% and 25-49%, and the right  coronary artery had 25-49% stenosis with a branch showing 50-69% stenosis. A distal LAD lesion is under evaluation with fractional flow reserve (FFR) testing.  He is on Lipitor, with an LDL level of 67 mg/dL as of July, and takes aspirin  and Plavix . He does not have nitroglycerin  at home. We have provided him with a prescription.   He frequently experiences nausea, which improves with food intake. No recent chest pain or shortness of breath. He denies recent cocaine use. Blood pressure is elevated, attributed to 'white coat syndrome'.  Creatinine level was normal at 0.8 mg/dL as of September 2.  Reports no shortness of breath nor dyspnea on exertion. Reports no chest pain, pressure, or tightness. No edema, orthopnea, PND. Reports no palpitations.   Discussed the use of AI scribe software for clinical note transcription with the patient, who gave verbal consent to proceed.   ROS: pertinent ROS in HPI  Studies Reviewed EKG Interpretation Date/Time:  Tuesday September 10 2024 15:18:19 EDT Ventricular Rate:  57 PR Interval:  122 QRS Duration:  98 QT Interval:  454 QTC Calculation: 441 R Axis:   29  Text Interpretation: Sinus bradycardia Left ventricular hypertrophy with repolarization abnormality ( R in aVL , Sokolow-Lyon , Romhilt-Estes ) Cannot rule out Inferior infarct , age undetermined When compared with ECG of 24-Jun-2024 11:13, Minimal criteria for Inferior infarct are now Present Inverted T waves have replaced nonspecific T wave abnormality in Inferior leads Confirmed by Lucien Blanc [phone]) on 09/10/2024 4:02:59 PM   CT coronary angiogram 08/28/2024 EXAM: FFRCT ANALYSIS  FINDINGS: FFRct analysis was performed on the original cardiac CT angiogram dataset. Diagrammatic representation of the FFRct analysis is provided in a separate PDF document in PACS. This dictation was created using the PDF document and an interactive 3D model of the results. 3D model is not available in the  EMR/PACS. Normal FFR range is >0.80.   1. Left Main: No significant stenosis. Distal FFR 0.99.   2. LAD: Possible significant stenosis.i Mid and distal LAD/D1 could not be modeled.   3. LCX: Possible significant stenosis. 0.94, 0.90 0.63   4. Ramus: Borderline significant stenosis. 0.98, 0.90,   0.80   5. RCA: No significant stenosis. Distal FFR 0.81.   IMPRESSION: 1. Coronary CTA FFR analysis demonstrates possible hemodynamically flow limiting lesion in the distal LCx that corresponds to lesion noted on CTA. The mid and distal LAD/D1 could not be modeled.   2.  Recommend cardiac catheterization.   Wilbert Bihari, MD     Electronically Signed   By: Wilbert Bihari M.D.   On: 08/28/2024 13:07      Physical Exam VS:  BP (!) 164/92   Pulse 60   Ht 6' (1.829 m)   Wt 243 lb (110.2 kg)   SpO2 97%   BMI 32.96 kg/m        Wt Readings from Last 3 Encounters:  09/10/24 243 lb (110.2 kg)  06/24/24 246 lb (111.6 kg)  06/05/24 239 lb (108.4 kg)    GEN: Well nourished, well developed in no acute distress NECK: No JVD; No carotid bruits CARDIAC: RRR, no murmurs, rubs, gallops RESPIRATORY:  Clear to auscultation without rales, wheezing or rhonchi  ABDOMEN: Soft, non-tender, non-distended EXTREMITIES:  No edema; No deformity   ASSESSMENT AND PLAN  Coronary artery disease with severe LAD stenosis Severe LAD stenosis (70-99%) confirmed by CT. Moderate stenosis in other arteries. Cardiac catheterization planned to evaluate stenting feasibility. Discussed revascularization options. Explained procedural risks: bleeding, dye allergy (0.5%), heart attack/stroke (0.1%). Normal kidney function reduces dye risk. Preference for Dr. Wonda. - Schedule cardiac catheterization with Dr. Wonda. - Prescribe nitroglycerin  tablets for emergency use. - EKG does show new ST depressions in lead II (d/w Dr. Anner) - Order full BMP and CBC.  Mixed hyperlipidemia LDL at 67 mg/dL in July, near  target <44 mg/dL. Lipitor regimen effective.  Essential hypertension Elevated blood pressure, likely due to white coat syndrome and anxiety.  Nausea Nausea likely related to anxiety and stress, improves with food. - Discussed lifestyle modifications, including small meals. -unlikely to be anginal equivalent     Informed Consent   Shared Decision Making/Informed Consent All outpatient stress tests require an informed consent ([id number]) ATTESTATION ORDER       :[id number]} The risks [stroke (1 in 1000), death (1 in 1000), kidney failure [usually temporary] (1 in 500), bleeding (1 in 200), allergic reaction [possibly serious] (1 in 200)], benefits (diagnostic support and management of coronary artery disease) and alternatives of a cardiac catheterization were discussed in detail with Mr. Rengel and he is willing to proceed.     Dispo: He can follow-up after cardiac cath  Signed, Orren LOISE Fabry, PA-C

## 2024-09-10 NOTE — Patient Instructions (Addendum)
 Medication Instructions:   Your physician recommends that you continue on your current medications as directed. Please refer to the Current Medication list given to you today.   *If you need a refill on your cardiac medications before your next appointment, please call your pharmacy*   Lab Work:  PLEASE GO DOWN STAIRS  [facility]  FIRST FLOOR   ( GET OFF ELEVATORS WALK TOWARDS WAITING AREA LAB LOCATED BY PHARMACY):  BMET AND CBC TODAY      If you have labs (blood work) drawn today and your tests are completely normal, you will receive your results only by: MyChart Message (if you have MyChart) OR A paper copy in the mail If you have any lab test that is abnormal or we need to change your treatment, we will call you to review the results.     Testing/Procedures:  ON SEPTEMBER 25 Your physician has requested that you have a cardiac catheterization. Cardiac catheterization is used to diagnose and/or treat various heart conditions. Doctors may recommend this procedure for a number of different reasons. The most common reason is to evaluate chest pain. Chest pain can be a symptom of coronary artery disease (CAD), and cardiac catheterization can show whether plaque is narrowing or blocking your heart's arteries. This procedure is also used to evaluate the valves, as well as measure the blood flow and oxygen levels in different parts of your heart. For further information please visit [email or web address] Please follow instruction sheet, as given.   Follow-Up: At [facility], you and your health needs are our priority.  As part of our continuing mission to provide you with exceptional heart care, our providers are all part of one team.  This team includes your primary Cardiologist (physician) and Advanced Practice Providers or APPs (Physician Assistants and Nurse Practitioners) who all work together to provide you with the care you need, when you need it.   Your next appointment: POST  CATH  2 week(s)  AFTER SEPTEMBER 25   Provider:  Orren Fabry PA-C     We recommend signing up for the patient portal called MyChart.  Sign up information is provided on this After Visit Summary.  MyChart is used to connect with patients for Virtual Visits (Telemedicine).  Patients are able to view lab/test results, encounter notes, upcoming appointments, etc.  Non-urgent messages can be sent to your provider as well.   To learn more about what you can do with MyChart, go to [email or web address].   Other Instructions   [facility] A DEPT OF [facility] AT [address] A DEPT OF THE [facility] [address] [place] [address] Dept: [phone] Loc: [phone]  Nicholas Delacruz  09/10/2024  You are scheduled for a Cardiac Catheterization on Thursday, September 25 with Dr. Ozell Fell.   1. Please arrive at the [facility] (Main Entrance A) at [facility]: [address] [place], [place] [address] at 11:30 AM (This time is 2 hour(s) before your procedure to ensure your preparation).   Free valet parking service is available. You will check in at ADMITTING. The support person will be asked to wait in the waiting room.  It is OK to have someone drop you off and come back when you are ready to be discharged.     Special note: Every effort is made to have your procedure done on time. Please understand that emergencies sometimes delay scheduled procedures.  2. Diet: Nothing to eat after midnight. On the morning of your procedure, take your Aspirin  81 mg and your       morning medicines You may use sips of water    3. Hydration:  follow as stated above   4. Labs: You will need to have blood drawn on Tuesday, September 16 at [facility] (1st Floor), [address] [place], [place] [address]. You do not need to be fasting.  5. Medication instructions in preparation for  your procedure:   Contrast Allergy: No  6. Plan to go home the same day, you will only stay overnight if medically necessary. 7. Bring a current list of your medications and current insurance cards. 8. You MUST have a responsible person to drive you home. 9. Someone MUST be with you the first 24 hours after you arrive home or your discharge will be delayed. 10. Please wear clothes that are easy to get on and off and wear slip-on shoes.  Thank you for allowing us  to care for you!   -- [facility]

## 2024-09-16 ENCOUNTER — Encounter: Payer: Self-pay | Admitting: Family Medicine

## 2024-09-16 ENCOUNTER — Ambulatory Visit: Admitting: Family Medicine

## 2024-09-16 ENCOUNTER — Other Ambulatory Visit: Payer: Self-pay | Admitting: Family Medicine

## 2024-09-16 ENCOUNTER — Telehealth: Payer: Self-pay | Admitting: *Deleted

## 2024-09-16 VITALS — BP 132/70 | HR 61 | Temp 98.1°F | Ht 72.0 in | Wt 244.0 lb

## 2024-09-16 DIAGNOSIS — F1910 Other psychoactive substance abuse, uncomplicated: Secondary | ICD-10-CM

## 2024-09-16 DIAGNOSIS — I5020 Unspecified systolic (congestive) heart failure: Secondary | ICD-10-CM

## 2024-09-16 DIAGNOSIS — I2510 Atherosclerotic heart disease of native coronary artery without angina pectoris: Secondary | ICD-10-CM | POA: Diagnosis not present

## 2024-09-16 DIAGNOSIS — E782 Mixed hyperlipidemia: Secondary | ICD-10-CM

## 2024-09-16 DIAGNOSIS — F1990 Other psychoactive substance use, unspecified, uncomplicated: Secondary | ICD-10-CM | POA: Insufficient documentation

## 2024-09-16 DIAGNOSIS — I429 Cardiomyopathy, unspecified: Secondary | ICD-10-CM | POA: Diagnosis not present

## 2024-09-16 DIAGNOSIS — Z7984 Long term (current) use of oral hypoglycemic drugs: Secondary | ICD-10-CM

## 2024-09-16 DIAGNOSIS — I10 Essential (primary) hypertension: Secondary | ICD-10-CM

## 2024-09-16 DIAGNOSIS — E1165 Type 2 diabetes mellitus with hyperglycemia: Secondary | ICD-10-CM | POA: Diagnosis not present

## 2024-09-16 NOTE — Telephone Encounter (Signed)
 I reviewed procedure instructions with patient and sent MyChart message with instructions. Patient tells me he is going to [facility] [place] location this afteroon for BMP/CBC.

## 2024-09-16 NOTE — Progress Notes (Signed)
 Subjective:    Patient ID: Nicholas Delacruz, male    DOB: 05-Jan-1966, 58 y.o.   MRN: [id number]  HPI Patient is a 58 year old gentleman here today to establish care.  Past medical history is significant for cardiomyopathy.  During hospitalization this year for lacunar infarct, patient was found to have a reduced EF of 40 to 45%.  Workup at that time revealed coronary artery disease.:  He has significant stenosis in the left anterior descending (LAD) artery. A recent CT scan showed multiple areas of coronary artery stenosis: the left main artery with less than 25% stenosis, the LAD with two areas of 25-49% and 70-99% stenosis, and a branch off the LAD with 50-69% stenosis. The left circumflex artery had stenosis of 50-69% and 25-49%, and the right coronary artery had 25-49% stenosis with a branch showing 50-69% stenosis.   He also has a history of type 2 diabetes mellitus, obstructive sleep apnea, hypertension, and a history of cocaine use.  Patient recently had a catheterization and cardiology is planning stent placement in the next week for the 99% stenosis in the LAD.  I do not see a recent cholesterol or hemoglobin A1c to assess management.  Obviously we want to prevent progression of the other blockages in his coronary arteries.  As a result I would like to see his LDL cholesterol less than 55 and his A1c less than 6.5.  He is interested in trying Ozempic .  He has a history of bilateral knee replacements and degenerative disc disease in his lower back.  Therefore he was on hydrocodone .  He states that he takes approximately 1/day.  I reviewed his medical records and he was receiving 120 tablets/month from his previous PCP.  This will be 4 tablets a day.  I asked the patient about this and he states that he has some other medication leftover.  He states that he was not taking the medicine that often and that 1 pill a day would be sufficient.  I would be comfortable with 1 pill a day to help with back  pain however I would want to get a urine drug screen first to determine if he was abusing cocaine.  If so I would not prescribe narcotics. Past Medical History:  Diagnosis Date   Arthritis    Diabetes mellitus without complication (HCC)    GERD (gastroesophageal reflux disease)    High blood pressure    Obstructive sleep apnea on CPAP    BIPAP   PONV (postoperative nausea and vomiting)    blood pressure dropped in recovery   Primary localized osteoarthritis of left knee 03/27/2018   Past Surgical History:  Procedure Laterality Date   APPENDECTOMY     KNEE ARTHROPLASTY     KNEE ARTHROSCOPY Right 01/02/2015   Procedure: RIGHT KNEE ARTHROSCOPY, LATERAL AND MEDIAL MENISECTOMY;  Surgeon: Taft FORBES Minerva, MD;  Location: [facility];  Service: Orthopedics;  Laterality: Right;   KNEE ARTHROSCOPY WITH MEDIAL MENISECTOMY Left 06/25/2014   Procedure: KNEE ARTHROSCOPY WITH MEDIAL MENISECTOMY;  Surgeon: Taft FORBES Minerva, MD;  Location: [facility];  Service: Orthopedics;  Laterality: Left;   KNEE SURGERY Left    MENISCUS DEBRIDEMENT Right 01/02/2015   Procedure: DEBRIDEMENT OF RIGHT KNEE JOINT;  Surgeon: Taft FORBES Minerva, MD;  Location: [facility];  Service: Orthopedics;  Laterality: Right;   PARTIAL KNEE ARTHROPLASTY Left 03/27/2018   Procedure: UNICOMPARTMENTAL LEFT KNEE;  Surgeon: Josefina Chew, MD;  Location: [facility];  Service: Orthopedics;  Laterality: Left;  right knee sark     SHOULDER SURGERY     left   TOTAL KNEE ARTHROPLASTY Right 08/30/2016   Procedure: TOTAL KNEE ARTHROPLASTY;  Surgeon: Fonda Olmsted, MD;  Location: [facility] OR;  Service: Orthopedics;  Laterality: Right;   TOTAL SHOULDER ARTHROPLASTY     Current Outpatient Medications on File Prior to Visit  Medication Sig Dispense Refill   gabapentin  (NEURONTIN ) 300 MG capsule Take 300 mg by mouth 2 (two) times daily.     aspirin  EC 81 MG tablet Take 1 tablet (81 mg total) by mouth daily. Swallow whole. 150 tablet 2   atorvastatin  (LIPITOR) 80 MG tablet  Take 1 tablet (80 mg total) by mouth daily. 90 tablet 3   clopidogrel  (PLAVIX ) 75 MG tablet Take 1 tablet (75 mg total) by mouth daily. 60 tablet 0   diltiazem  (CARDIZEM  CD) 300 MG 24 hr capsule Take 300 mg by mouth at bedtime.      escitalopram (LEXAPRO) 20 MG tablet Take 20 mg by mouth daily.     fluticasone  (FLONASE ) 50 MCG/ACT nasal spray Place 2 sprays into both nostrils daily.     hydrALAZINE  (APRESOLINE ) 100 MG tablet Take 1 tablet (100 mg total) by mouth 2 (two) times daily. 180 tablet 3   HYDROcodone -acetaminophen  (NORCO) 10-325 MG tablet Take 1 tablet by mouth every 4 (four) hours.     ibuprofen  (ADVIL ) 800 MG tablet Take 800 mg by mouth 3 (three) times daily as needed.     labetalol  (NORMODYNE ) 300 MG tablet Take 300 mg by mouth 2 (two) times daily.     losartan -hydrochlorothiazide  (HYZAAR) 100-25 MG tablet Take 1 tablet by mouth daily.      metFORMIN  (GLUCOPHAGE ) 500 MG tablet Take 500 mg by mouth 2 (two) times daily.     mupirocin  ointment (BACTROBAN ) 2 % Apply 1 Application topically 2 (two) times daily. 22 g 0   nitroGLYCERIN  (NITROSTAT ) 0.4 MG SL tablet Place 1 tablet (0.4 mg total) under the tongue every 5 (five) minutes as needed for chest pain. 25 tablet 3   omeprazole (PRILOSEC) 40 MG capsule Take 40 mg by mouth daily.      ondansetron  (ZOFRAN ) 4 MG tablet Take 1 tablet (4 mg total) by mouth every 8 (eight) hours as needed for nausea or vomiting. 10 tablet 0   zolpidem  (AMBIEN ) 10 MG tablet Take 10 mg by mouth at bedtime.      No current facility-administered medications on file prior to visit.   Allergies  Allergen Reactions   Levofloxacin Anaphylaxis   Social History   Socioeconomic History   Marital status: Divorced    Spouse name: Not on file   Number of children: Not on file   Years of education: 34 th grad   Highest education level: Not on file  Occupational History   Occupation: [employer]  Tobacco Use   Smoking status: Never   Smokeless tobacco:  Never  Vaping Use   Vaping status: Never Used  Substance and Sexual Activity   Alcohol use: Yes    Comment: socially   Drug use: No   Sexual activity: Yes    Birth control/protection: None  Other Topics Concern   Not on file  Social History Narrative   Not on file   Social Drivers of Health   Financial Resource Strain: Not on file  Food Insecurity: No Food Insecurity (03/28/2024)   Hunger Vital Sign    Worried About Running Out of Food in the Last Year:  Never true    Ran Out of Food in the Last Year: Never true  Transportation Needs: No Transportation Needs (03/28/2024)   PRAPARE - Administrator, Civil Service (Medical): No    Lack of Transportation (Non-Medical): No  Physical Activity: Not on file  Stress: Not on file  Social Connections: Unknown (03/28/2024)   Social Connection and Isolation Panel    Frequency of Communication with Friends and Family: Not on file    Frequency of Social Gatherings with Friends and Family: Not on file    Attends Religious Services: Not on file    Active Member of Clubs or Organizations: Not on file    Attends Banker Meetings: Not on file    Marital Status: Divorced  Intimate Partner Violence: Not At Risk (03/28/2024)   Humiliation, Afraid, Rape, and Kick questionnaire    Fear of Current or Ex-Partner: No    Emotionally Abused: No    Physically Abused: No    Sexually Abused: No    Review of Systems  All other systems reviewed and are negative.      Objective:   Physical Exam Constitutional:      Appearance: Normal appearance. He is obese.  Neck:     Vascular: No carotid bruit.  Cardiovascular:     Rate and Rhythm: Normal rate and regular rhythm.     Pulses: Normal pulses.     Heart sounds: Normal heart sounds. No murmur heard.    No friction rub. No gallop.  Pulmonary:     Effort: Pulmonary effort is normal. No respiratory distress.     Breath sounds: Normal breath sounds. No wheezing, rhonchi or rales.   Abdominal:     General: Bowel sounds are normal.     Palpations: Abdomen is soft.  Musculoskeletal:     Right lower leg: No edema.     Left lower leg: No edema.  Neurological:     Mental Status: He is alert.           Assessment & Plan:   Coronary artery disease involving native coronary artery of native heart, unspecified whether angina present - Plan: Hemoglobin A1c, CBC with Differential/Platelet, Comprehensive metabolic panel with GFR, Lipid panel, Microalbumin/Creatinine Ratio, Urine  Cardiomyopathy, unspecified type (HCC)  Benign essential HTN  Type 2 diabetes mellitus with hyperglycemia, without long-term current use of insulin  (HCC) - Plan: Hemoglobin A1c, CBC with Differential/Platelet, Comprehensive metabolic panel with GFR, Lipid panel, Microalbumin/Creatinine Ratio, Urine  Drug abuse (HCC)  HFrEF (heart failure with reduced ejection fraction) (HCC)  Mixed hyperlipidemia  Drug use - Plan: DRUG MONITOR, PANEL 1, W/CONF, URINE Regarding his coronary artery disease, I want to make sure that we have his risk factors as well-controlled as possible.  Blood pressure today is excellent 132/70.  I will get a baseline hemoglobin A1c today and if greater than 6.5 we will plan to add Ozempic  to his metformin .  I would like to see his LDL cholesterol less than 55.  If not, I plan to add Repatha to Lipitor.  Check a urine protein creatinine ratio to assess for diabetic nephropathy.  I reviewed his hospitalization records.  There was a positive drug screen for cocaine.  His urine drug screen shows any illicit drugs, I will not prescribe contacts.  If drug screen is appropriate, I plan to continue the patient on hydrocodone /acetaminophen  7.5 mg but I will give him 30 tablets/month instead of 120.  I will refill his Ambien .  Recommended  the patient not use ibuprofen  due to the fact he is on a dual antiplatelet agent Plavix  and aspirin .

## 2024-09-17 ENCOUNTER — Ambulatory Visit: Payer: Self-pay

## 2024-09-17 ENCOUNTER — Other Ambulatory Visit: Payer: Self-pay

## 2024-09-17 ENCOUNTER — Ambulatory Visit: Payer: Self-pay | Admitting: Family Medicine

## 2024-09-17 DIAGNOSIS — I2510 Atherosclerotic heart disease of native coronary artery without angina pectoris: Secondary | ICD-10-CM

## 2024-09-17 DIAGNOSIS — E1165 Type 2 diabetes mellitus with hyperglycemia: Secondary | ICD-10-CM

## 2024-09-17 DIAGNOSIS — I5020 Unspecified systolic (congestive) heart failure: Secondary | ICD-10-CM

## 2024-09-17 DIAGNOSIS — E782 Mixed hyperlipidemia: Secondary | ICD-10-CM

## 2024-09-17 MED ORDER — OZEMPIC (0.25 OR 0.5 MG/DOSE) 2 MG/3ML ~~LOC~~ SOPN: 0.2500 mg | PEN_INJECTOR | SUBCUTANEOUS | 0 refills | Status: AC

## 2024-09-17 NOTE — Telephone Encounter (Signed)
 Please review 09/16/24 CBC/CMP for cath 09/19/24-note Hgb 9.2-thanks.  Patient tells me he was prescribed Ozempic  recently but has not started and will hold starting until after cath 09/19/24

## 2024-09-17 NOTE — Telephone Encounter (Signed)
 Copied from CRM #[id number]. Topic: Clinical - Lab/Test Results >> Sep 17, 2024  4:24 PM Martinique E wrote: Reason for CRM: Relayed noted that PCP attached, patient has further questions. Answer Assessment - Initial Assessment Questions Patient returned call and asked about lab results. This RN read Dr. Clara recommendations: Cholesterol good, stay on atorvastatin . Diabetes is poorly controlled. Add ozempic  0.25 mg sq weekly and we need to uptitrate each month if he tolerates it to a higher dose.  Stay on metformin  Hgb is low, losing blood.  Check stool for blood and check iron and b12 level.    This RN also informed patient:  Message to our lab tech to have the additional bloodwork added to your labs from yesterday. We will need you to come to the office to pick up the stool kit to check your stool for blood.  Protocols used: Information Only Call - No Triage-A-AH

## 2024-09-18 LAB — LIPID PANEL
Cholesterol: 102 mg/dL (ref <200)
HDL: 47 mg/dL (ref >=40)
LDL Cholesterol (Calc): 42 mg/dL
Non-HDL Cholesterol (Calc): 55 mg/dL (ref <130)
Total CHOL/HDL Ratio: 2.2 (calc) (ref <5.0)
Triglycerides: 57 mg/dL (ref <150)

## 2024-09-18 LAB — CBC WITH DIFFERENTIAL/PLATELET
Absolute Lymphocytes: 957 {cells}/uL (ref 850–3900)
Absolute Monocytes: 635 {cells}/uL (ref 200–950)
Basophils Absolute: 52 {cells}/uL (ref 0–200)
Basophils Relative: 0.6 %
Eosinophils Absolute: 313 {cells}/uL (ref 15–500)
Eosinophils Relative: 3.6 %
HCT: 29.9 % — ABNORMAL LOW (ref 38.5–50.0)
Hemoglobin: 9.2 g/dL — ABNORMAL LOW (ref 13.2–17.1)
MCH: 24.3 pg — ABNORMAL LOW (ref 27.0–33.0)
MCHC: 30.8 g/dL — ABNORMAL LOW (ref 32.0–36.0)
MCV: 78.9 fL — ABNORMAL LOW (ref 80.0–100.0)
MPV: 10.4 fL (ref 7.5–12.5)
Monocytes Relative: 7.3 %
Neutro Abs: 6743 {cells}/uL (ref 1500–7800)
Neutrophils Relative %: 77.5 %
Platelets: 394 Thousand/uL (ref 140–400)
RBC: 3.79 Million/uL — ABNORMAL LOW (ref 4.20–5.80)
RDW: 14.7 % (ref 11.0–15.0)
Total Lymphocyte: 11 %
WBC: 8.7 Thousand/uL (ref 3.8–10.8)

## 2024-09-18 LAB — IRON,TIBC AND FERRITIN PANEL
%SAT: 7 % — ABNORMAL LOW (ref 20–48)
Ferritin: 17 ng/mL — ABNORMAL LOW (ref 24–380)
Iron: 26 ug/dL — ABNORMAL LOW (ref 50–180)
TIBC: 354 ug/dL (ref 250–425)

## 2024-09-18 LAB — COMPREHENSIVE METABOLIC PANEL WITH GFR
AG Ratio: 1.3 (calc) (ref 1.0–2.5)
ALT: 13 U/L (ref 9–46)
AST: 12 U/L (ref 10–35)
Albumin: 4 g/dL (ref 3.6–5.1)
Alkaline phosphatase (APISO): 67 U/L (ref 35–144)
BUN: 12 mg/dL (ref 7–25)
CO2: 26 mmol/L (ref 20–32)
Calcium: 9.6 mg/dL (ref 8.6–10.3)
Chloride: 100 mmol/L (ref 98–110)
Creat: 0.81 mg/dL (ref 0.70–1.35)
Globulin: 3 g/dL (ref 1.9–3.7)
Glucose, Bld: 213 mg/dL — ABNORMAL HIGH (ref 65–99)
Potassium: 4.3 mmol/L (ref 3.5–5.3)
Sodium: 135 mmol/L (ref 135–146)
Total Bilirubin: 0.4 mg/dL (ref 0.2–1.2)
Total Protein: 7 g/dL (ref 6.1–8.1)
eGFR: 101 mL/min/1.73m2 (ref >=60)

## 2024-09-18 LAB — MICROALBUMIN / CREATININE URINE RATIO
Creatinine, Urine: 90 mg/dL (ref 20–320)
Microalb Creat Ratio: 57 mg/g{creat} — ABNORMAL HIGH (ref <30)
Microalb, Ur: 5.1 mg/dL

## 2024-09-18 LAB — DRUG MONITOR, PANEL 1, W/CONF, URINE
Amphetamines: NEGATIVE ng/mL (ref <500)
Barbiturates: NEGATIVE ng/mL (ref <300)
Benzodiazepines: NEGATIVE ng/mL (ref <100)
Benzoylecgonine: >20000 ng/mL — ABNORMAL HIGH (ref <100)
Cocaine Metabolite: POSITIVE ng/mL — AB (ref <150)
Creatinine: 81.3 mg/dL (ref >=20.0)
Marijuana Metabolite: NEGATIVE ng/mL (ref <20)
Methadone Metabolite: NEGATIVE ng/mL (ref <100)
Opiates: NEGATIVE ng/mL (ref <100)
Oxidant: NEGATIVE ug/mL (ref <200)
Oxycodone: NEGATIVE ng/mL (ref <100)
Phencyclidine: NEGATIVE ng/mL (ref <25)
pH: 6.8 (ref 4.5–9.0)

## 2024-09-18 LAB — TEST AUTHORIZATION

## 2024-09-18 LAB — HEMOGLOBIN A1C
Hgb A1c MFr Bld: 8.1 % — ABNORMAL HIGH (ref <5.7)
Mean Plasma Glucose: 186 mg/dL
eAG (mmol/L): 10.3 mmol/L

## 2024-09-18 LAB — B12 AND FOLATE PANEL
Folate: 5.9 ng/mL
Vitamin B-12: 298 pg/mL (ref 200–1100)

## 2024-09-18 LAB — DM TEMPLATE

## 2024-09-18 NOTE — Telephone Encounter (Signed)
 Per Dr Wonda 09/18/24 Secure Chat message: I reviewed his chart. I am okay to do a diagnostic cath on him. I probably will not intervene with the new anemia. For a distal circumflex lesion on CT, I probably have a high threshold to do PCI anyway unless he is having a lot of angina. Thanks

## 2024-09-19 ENCOUNTER — Telehealth: Payer: Self-pay

## 2024-09-19 ENCOUNTER — Encounter (HOSPITAL_COMMUNITY)
Admission: RE | Disposition: A | Discharge status: Home / Self Care | Source: Home / Self Care | Attending: Cardiovascular Disease

## 2024-09-19 ENCOUNTER — Ambulatory Visit (HOSPITAL_COMMUNITY)
Admission: RE | Admit: 2024-09-19 | Discharge: 2024-09-19 | Disposition: A | Discharge status: Home / Self Care | Attending: Cardiovascular Disease | Admitting: Cardiovascular Disease

## 2024-09-19 ENCOUNTER — Other Ambulatory Visit (HOSPITAL_COMMUNITY): Payer: Self-pay

## 2024-09-19 ENCOUNTER — Other Ambulatory Visit: Payer: Self-pay

## 2024-09-19 DIAGNOSIS — E119 Type 2 diabetes mellitus without complications: Secondary | ICD-10-CM | POA: Diagnosis not present

## 2024-09-19 DIAGNOSIS — G4733 Obstructive sleep apnea (adult) (pediatric): Secondary | ICD-10-CM | POA: Insufficient documentation

## 2024-09-19 DIAGNOSIS — R931 Abnormal findings on diagnostic imaging of heart and coronary circulation: Secondary | ICD-10-CM | POA: Insufficient documentation

## 2024-09-19 DIAGNOSIS — I5020 Unspecified systolic (congestive) heart failure: Secondary | ICD-10-CM | POA: Diagnosis not present

## 2024-09-19 DIAGNOSIS — I2510 Atherosclerotic heart disease of native coronary artery without angina pectoris: Secondary | ICD-10-CM | POA: Diagnosis not present

## 2024-09-19 DIAGNOSIS — I110 Hypertensive heart disease with heart failure: Secondary | ICD-10-CM | POA: Insufficient documentation

## 2024-09-19 DIAGNOSIS — Z8673 Personal history of transient ischemic attack (TIA), and cerebral infarction without residual deficits: Secondary | ICD-10-CM | POA: Diagnosis not present

## 2024-09-19 DIAGNOSIS — R110 Nausea: Secondary | ICD-10-CM | POA: Diagnosis not present

## 2024-09-19 DIAGNOSIS — Z79899 Other long term (current) drug therapy: Secondary | ICD-10-CM | POA: Insufficient documentation

## 2024-09-19 DIAGNOSIS — E782 Mixed hyperlipidemia: Secondary | ICD-10-CM | POA: Insufficient documentation

## 2024-09-19 DIAGNOSIS — Z7982 Long term (current) use of aspirin: Secondary | ICD-10-CM | POA: Insufficient documentation

## 2024-09-19 DIAGNOSIS — Z7902 Long term (current) use of antithrombotics/antiplatelets: Secondary | ICD-10-CM | POA: Insufficient documentation

## 2024-09-19 DIAGNOSIS — I429 Cardiomyopathy, unspecified: Secondary | ICD-10-CM | POA: Diagnosis not present

## 2024-09-19 HISTORY — PX: LEFT HEART CATH AND CORONARY ANGIOGRAPHY: CATH118249

## 2024-09-19 LAB — CBC
Hematocrit: 32.2 % — ABNORMAL LOW (ref 37.5–51.0)
Hemoglobin: 9.6 g/dL — ABNORMAL LOW (ref 13.0–17.7)
MCH: 24 pg — ABNORMAL LOW (ref 26.6–33.0)
MCHC: 29.8 g/dL — ABNORMAL LOW (ref 31.5–35.7)
MCV: 81 fL (ref 79–97)
Platelets: 408 x10E3/uL (ref 150–450)
RBC: 4 x10E6/uL — ABNORMAL LOW (ref 4.14–5.80)
RDW: 14.9 % (ref 11.6–15.4)
WBC: 9.3 x10E3/uL (ref 3.4–10.8)

## 2024-09-19 LAB — BASIC METABOLIC PANEL WITH GFR
BUN/Creatinine Ratio: 13 (ref 10–24)
BUN: 11 mg/dL (ref 8–27)
CO2: 24 mmol/L (ref 20–29)
Calcium: 9.8 mg/dL (ref 8.6–10.2)
Chloride: 99 mmol/L (ref 96–106)
Creatinine, Ser: 0.84 mg/dL (ref 0.76–1.27)
Glucose: 185 mg/dL — ABNORMAL HIGH (ref 70–99)
Potassium: 4.5 mmol/L (ref 3.5–5.2)
Sodium: 137 mmol/L (ref 134–144)
eGFR: 100 mL/min/1.73 (ref >59)

## 2024-09-19 LAB — GLUCOSE, CAPILLARY: Glucose-Capillary: 188 mg/dL — ABNORMAL HIGH (ref 70–99)

## 2024-09-19 SURGERY — LEFT HEART CATH AND CORONARY ANGIOGRAPHY
Anesthesia: LOCAL

## 2024-09-19 MED ORDER — VERAPAMIL HCL 2.5 MG/ML IV SOLN
INTRAVENOUS | Status: DC | PRN
  Administered 2024-09-19: 10 mL via INTRA_ARTERIAL

## 2024-09-19 MED ORDER — LIDOCAINE HCL (PF) 1 % IJ SOLN
INTRAMUSCULAR | Status: DC | PRN
  Administered 2024-09-19: 2 mL

## 2024-09-19 MED ORDER — LIDOCAINE HCL (PF) 1 % IJ SOLN
INTRAMUSCULAR | Status: AC
  Filled 2024-09-19: qty 30

## 2024-09-19 MED ORDER — SODIUM CHLORIDE 0.9% FLUSH: 3.0000 mL | INTRAVENOUS | Status: DC | PRN

## 2024-09-19 MED ORDER — FENTANYL CITRATE (PF) 100 MCG/2ML IJ SOLN
INTRAMUSCULAR | Status: DC | PRN
  Administered 2024-09-19: 25 ug via INTRAVENOUS

## 2024-09-19 MED ORDER — MIDAZOLAM HCL 2 MG/2ML IJ SOLN
INTRAMUSCULAR | Status: DC | PRN
  Administered 2024-09-19: 2 mg via INTRAVENOUS

## 2024-09-19 MED ORDER — FREE WATER: 500.0000 mL | Freq: Once | Status: DC

## 2024-09-19 MED ORDER — SODIUM CHLORIDE 0.9% FLUSH: 3.0000 mL | Freq: Two times a day (BID) | INTRAVENOUS | Status: DC

## 2024-09-19 MED ORDER — FENTANYL CITRATE (PF) 100 MCG/2ML IJ SOLN
INTRAMUSCULAR | Status: AC
  Filled 2024-09-19: qty 2

## 2024-09-19 MED ORDER — VERAPAMIL HCL 2.5 MG/ML IV SOLN
INTRAVENOUS | Status: AC
  Filled 2024-09-19: qty 2

## 2024-09-19 MED ORDER — HEPARIN SODIUM (PORCINE) 1000 UNIT/ML IJ SOLN
INTRAMUSCULAR | Status: DC | PRN
  Administered 2024-09-19: 5000 [IU] via INTRAVENOUS

## 2024-09-19 MED ORDER — CLOPIDOGREL BISULFATE 75 MG PO TABS
75.0000 mg | ORAL_TABLET | ORAL | Status: DC
Start: 2024-09-19 — End: 2024-09-19

## 2024-09-19 MED ORDER — SODIUM CHLORIDE 0.9 % IV SOLN: 250.0000 mL | INTRAVENOUS | Status: DC | PRN

## 2024-09-19 MED ORDER — IOHEXOL 350 MG/ML SOLN
INTRAVENOUS | Status: DC | PRN
  Administered 2024-09-19: 75 mL

## 2024-09-19 MED ORDER — HYDRALAZINE HCL 20 MG/ML IJ SOLN: 10.0000 mg | INTRAMUSCULAR | Status: DC | PRN

## 2024-09-19 MED ORDER — HEPARIN (PORCINE) IN NACL 1000-0.9 UT/500ML-% IV SOLN
INTRAVENOUS | Status: DC | PRN
  Administered 2024-09-19 (×2): 500 mL

## 2024-09-19 MED ORDER — MIDAZOLAM HCL 2 MG/2ML IJ SOLN
INTRAMUSCULAR | Status: AC
  Filled 2024-09-19: qty 2

## 2024-09-19 MED ORDER — ONDANSETRON HCL 4 MG/2ML IJ SOLN: 4.0000 mg | Freq: Four times a day (QID) | INTRAMUSCULAR | Status: DC | PRN

## 2024-09-19 MED ORDER — ASPIRIN 81 MG PO CHEW: 81.0000 mg | CHEWABLE_TABLET | ORAL | Status: DC

## 2024-09-19 MED ORDER — HEPARIN SODIUM (PORCINE) 1000 UNIT/ML IJ SOLN
INTRAMUSCULAR | Status: AC
Start: 2024-09-19 — End: 2024-09-19
  Filled 2024-09-19: qty 10

## 2024-09-19 MED ORDER — ACETAMINOPHEN 325 MG PO TABS
650.0000 mg | ORAL_TABLET | ORAL | Status: DC | PRN
  Administered 2024-09-19: 650 mg via ORAL
  Filled 2024-09-19: qty 2

## 2024-09-19 MED ORDER — LABETALOL HCL 5 MG/ML IV SOLN: 10.0000 mg | INTRAVENOUS | Status: DC | PRN

## 2024-09-19 SURGICAL SUPPLY — 7 items
CATH 5FR JL3.5 JR4 ANG PIG MP (CATHETERS) IMPLANT
CATH INFINITI 5FR AL1 (CATHETERS) IMPLANT
DEVICE RAD COMP TR BAND LRG (VASCULAR PRODUCTS) IMPLANT
GLIDESHEATH SLEND SS 6F .021 (SHEATH) IMPLANT
GUIDEWIRE INQWIRE 1.5J.035X260 (WIRE) IMPLANT
PACK CARDIAC CATHETERIZATION (CUSTOM PROCEDURE TRAY) ×1 IMPLANT
SET ATX-X65L (MISCELLANEOUS) IMPLANT

## 2024-09-19 NOTE — Progress Notes (Signed)
 Discharge instructions reviewed with patient and brother clarence at bedside denies questions or concern. TR Band removed no s/s of complications at incision site. PT ambulated in the hallway, was able to void without difficulty, tolerated PO Intake. Pt was escorted from the unit via wheel chair to personal vehicle.

## 2024-09-19 NOTE — Telephone Encounter (Signed)
 Pharmacy Patient Advocate Encounter   Received notification from [employer] that prior authorization for Ozempic  (0.25 or 0.5 MG/DOSE) 2MG /3ML pen-injectors is required/requested.   Insurance verification completed.   The patient is insured through [facility] .   Per test claim: PA required; PA submitted to above mentioned insurance via Lat[employer] Key/confirmation #/EOC [id number] Status is pending

## 2024-09-19 NOTE — Discharge Instructions (Signed)

## 2024-09-19 NOTE — Interval H&P Note (Signed)
 History and Physical Interval Note:  09/19/2024 1:46 PM  Nicholas Delacruz  has presented today for surgery, with the diagnosis of CAD.  The various methods of treatment have been discussed with the patient and family. After consideration of risks, benefits and other options for treatment, the patient has consented to  Procedure(s): LEFT HEART CATH AND CORONARY ANGIOGRAPHY (N/A) as a surgical intervention.  The patient's history has been reviewed, patient examined, no change in status, stable for surgery.  I have reviewed the patient's chart and labs.  Questions were answered to the patient's satisfaction.     Ozell Fell

## 2024-09-20 ENCOUNTER — Encounter (HOSPITAL_COMMUNITY): Payer: Self-pay | Admitting: Cardiovascular Disease

## 2024-09-20 NOTE — Telephone Encounter (Signed)
 Pharmacy Patient Advocate Encounter  Received notification from [employer] that Prior Authorization for Ozempic  (0.25 or 0.5 MG/DOSE) 2MG /3ML pen-injectors  has been APPROVED from 09/19/24 to 09/19/25   PA #/Case ID/Reference #: [id number]

## 2024-09-24 ENCOUNTER — Ambulatory Visit: Admitting: Cardiology

## 2024-09-26 ENCOUNTER — Ambulatory Visit: Admitting: Pharmacist

## 2024-09-30 ENCOUNTER — Other Ambulatory Visit: Payer: Self-pay | Admitting: Family Medicine

## 2024-09-30 MED ORDER — IRON (FERROUS SULFATE) 325 (65 FE) MG PO TABS: 325.0000 mg | ORAL_TABLET | Freq: Every day | ORAL | 5 refills | Status: AC

## 2024-10-06 NOTE — Progress Notes (Deleted)
 Cardiology Office Note    Date:  10/06/2024  ID:  Nicholas Delacruz, DOB [birth year], MRN [id number] PCP:  Nicholas Delacruz DASEN, MD  Cardiologist:  Nicholas JINNY Lawrence, MD  Electrophysiologist:  None   Chief Complaint: ***  History of Present Illness: Nicholas    Nicholas Delacruz is a 59 y.o. male with visit-pertinent history of hypertension, hyperlipidemia, type II DM, cardiomyopathy EF 45 to 50%, history of cocaine abuse, history of stroke, OSA on CPAP and GERD.  Patient was hospitalized in 03/2024 for 2 days with confusion.  Workup showed acute left hemisphere lacunar infarct without large vessel occlusion.  Echo showed mild reduction in LVEF 40 to 45% with global hypokinesis.  This was thought to be related to history of cocaine abuse and found to be positive for cocaine testing during his hospitalization.  Office visit in 05/2024 LVEF remained low were working to optimize GDMT for HFrEF.  Coronary CT angiogram was ordered to evaluate an obstructive CAD as etiology for low EF.  On 08/28/2024 underwent coronary CTA which showed a lesion in the distal LCx that was positive for FFR.  Cardiac catheterization was recommended.  Patient underwent LHC on 09/19/2024 that showed widely patent left main with no stenosis, diffusely diseased LAD/diagonal with distal LAD occlusion and collaterals R>L supplying the apical LAD and distal diagonal territories, nonobstructive LCx plaquing, patent dominant RCA with severe PDA stenosis, medical therapy was recommended for CAD.  Today he presents for follow-up.  He reports that he   CAD: Patient noted to have severe LAD stenosis, 79 9% confirmed by CT.  Moderate stenosis in other arteries.  Patient underwent cardiac catheterization on 09/19/2024 that showed widely patent left main with no stenosis, diffusely diseased LAD/diagonal with distal LAD occlusion and collaterals R>L supplying the apical LAD and distal diagonal territories, nonobstructive LCx plaquing, patent dominant  RCA with severe PDA stenosis, medical therapy was recommended for CAD. Cardiac cath site Reviewed ED precautions Continue  Heart failure mildly reduced EF: Echocardiogram on 08/07/2024 indicated LVEF 40 to 45%, LV with mildly decreased function, LV demonstrates global hypokinesis, LV internal cavity size was mildly to moderately dilated, diastolic parameters consistent with G2 DD, RV systolic function and size was normal, LA size is mildly dilated, no evidence of mitral regurgitation or stenosis.   Hypertension: Blood pressure today  Hyperlipidemia: Last lipid profile on Type 2 DM:   Labwork independently reviewed:   ROS: .   *** denies chest pain, shortness of breath, lower extremity edema, fatigue, palpitations, melena, hematuria, hemoptysis, diaphoresis, weakness, presyncope, syncope, orthopnea, and PND.  All other systems are reviewed and otherwise negative.  Studies Reviewed: Nicholas    EKG:  EKG is ordered today, personally reviewed, demonstrating ***     CV Studies: Cardiac studies reviewed are outlined and summarized above. Otherwise please see EMR for full report. Cardiac Studies & Procedures   ______________________________________________________________________________________________ CARDIAC CATHETERIZATION  CARDIAC CATHETERIZATION 09/19/2024  Conclusion Widely patent left main with no stenosis Diffusely diseased LAD/diagonal, with distal LAD occlusion and collaterals (R--->L) supplying the apical LAD and distal diagonal territories Nonobstructive LCx plaquing Patent, dominant RCA with severe PDA stenosis 5.   Normal LVEDP  Recommend: medical therapy for CAD  Findings Coronary Findings Diagnostic  Dominance: Right  Left Anterior Descending Collaterals Dist LAD filled by collaterals from RPDA.  Prox LAD to Mid LAD lesion is 70% stenosed. Dist LAD lesion is 100% stenosed.  First Diagonal Branch Collaterals 1st Diag filled by collaterals from RPDA.  1st Diag  lesion is 70% stenosed.  Ramus Intermedius Ramus lesion is 50% stenosed.  Right Coronary Artery  Right Posterior Descending Artery RPDA lesion is 80% stenosed.  Intervention  No interventions have been documented.     ECHOCARDIOGRAM  ECHOCARDIOGRAM COMPLETE 08/07/2024  Narrative ECHOCARDIOGRAM REPORT    Patient Name:   Nicholas Delacruz Date of Exam: 08/07/2024 Medical Rec #:  [id number]        Height:       72.0 in Accession #:    [id number]       Weight:       246.0 lb Date of Birth:  1966-07-04         BSA:          2.327 m Patient Age:    57 years         BP:           176/91 mmHg Patient Gender: M                HR:           52 bpm. Exam Location:  [address]  Procedure: 2D Echo, Cardiac Doppler, Color Doppler and 3D Echo (Both Spectral and Color Flow Doppler were utilized during procedure).  Indications:    Heart Failure with reduced ejection fraction. I50.20  History:        Patient has prior history of Echocardiogram examinations, most recent 03/28/2024. Risk Factors:Diabetes.  Sonographer:    Nicholas Delacruz RDCS Referring Phys: [id number] Nicholas Delacruz Nicholas Delacruz  IMPRESSIONS   1. No LV thrombus seen on apical views. Left ventricular ejection fraction, by estimation, is 40 to 45%. Left ventricular ejection fraction by 3D volume is 44 %. The left ventricle has mildly decreased function. The left ventricle demonstrates global hypokinesis. The left ventricular internal cavity size was mildly to moderately dilated. Left ventricular diastolic parameters are consistent with Grade II diastolic dysfunction (pseudonormalization). 2. Right ventricular systolic function is normal. The right ventricular size is normal. Tricuspid regurgitation signal is inadequate for assessing PA pressure. 3. Left atrial size was mildly dilated. 4. The mitral valve is normal in structure. No evidence of mitral valve regurgitation. No evidence of mitral stenosis. 5. The aortic valve is  tricuspid. Aortic valve regurgitation is not visualized. No aortic stenosis is present. 6. The inferior vena cava is normal in size with <50% respiratory variability, suggesting right atrial pressure of 8 mmHg.  FINDINGS Left Ventricle: No LV thrombus seen on apical views. Left ventricular ejection fraction, by estimation, is 40 to 45%. Left ventricular ejection fraction by 3D volume is 44 %. The left ventricle has mildly decreased function. The left ventricle demonstrates global hypokinesis. The left ventricular internal cavity size was mildly to moderately dilated. There is no left ventricular hypertrophy. Left ventricular diastolic parameters are consistent with Grade II diastolic dysfunction (pseudonormalization).  Right Ventricle: The right ventricular size is normal. No increase in right ventricular wall thickness. Right ventricular systolic function is normal. Tricuspid regurgitation signal is inadequate for assessing PA pressure.  Left Atrium: Left atrial size was mildly dilated.  Right Atrium: Right atrial size was normal in size.  Pericardium: There is no evidence of pericardial effusion.  Mitral Valve: The mitral valve is normal in structure. No evidence of mitral valve regurgitation. No evidence of mitral valve stenosis.  Tricuspid Valve: The tricuspid valve is normal in structure. Tricuspid valve regurgitation is trivial. No evidence of tricuspid stenosis.  Aortic Valve: The aortic valve is tricuspid. Aortic  valve regurgitation is not visualized. No aortic stenosis is present.  Pulmonic Valve: The pulmonic valve was normal in structure. Pulmonic valve regurgitation is not visualized. No evidence of pulmonic stenosis.  Aorta: The aortic root is normal in size and structure.  Venous: The inferior vena cava is normal in size with less than 50% respiratory variability, suggesting right atrial pressure of 8 mmHg.  IAS/Shunts: No atrial level shunt detected by color flow  Doppler.  Additional Comments: 3D was performed not requiring image post processing on an independent workstation and was abnormal.   LEFT VENTRICLE PLAX 2D LVIDd:         6.20 cm         Diastology LVIDs:         5.30 cm         LV e' medial:    5.48 cm/s LV PW:         1.20 cm         LV E/e' medial:  18.1 LV IVS:        1.10 cm         LV e' lateral:   7.65 cm/s LVOT diam:     2.40 cm         LV E/e' lateral: 12.9 LV SV:         143 LV SV Index:   61 LVOT Area:     4.52 cm        3D Volume EF LV 3D EF:    Left ventricul ar ejection fraction by 3D volume is 44 %.  3D Volume EF: 3D EF:        44 % LV EDV:       340 ml LV ESV:       192 ml LV SV:        148 ml  RIGHT VENTRICLE             IVC RV Basal diam:  3.40 cm     IVC diam: 1.90 cm RV Mid diam:    3.20 cm RV S prime:     13.10 cm/s TAPSE (M-mode): 2.7 cm  LEFT ATRIUM             Index        RIGHT ATRIUM           Index LA diam:        4.80 cm 2.06 cm/m   RA Area:     20.80 cm LA Vol (A2C):   83.3 ml 35.80 ml/m  RA Volume:   60.30 ml  25.92 ml/m LA Vol (A4C):   85.0 ml 36.54 ml/m LA Biplane Vol: 84.2 ml 36.19 ml/m AORTIC VALVE LVOT Vmax:   147.00 cm/s LVOT Vmean:  95.000 cm/s LVOT VTI:    0.316 m  AORTA Ao Root diam: 3.80 cm Ao Asc diam:  3.70 cm  MITRAL VALVE MV Area (PHT): 2.87 cm    SHUNTS MV Decel Time: 264 msec    Systemic VTI:  0.32 m MV E velocity: 99.00 cm/s  Systemic Diam: 2.40 cm MV A velocity: 62.40 cm/s MV E/A ratio:  1.59  Morene Brownie Electronically signed by Morene Brownie Signature Date/Time: 08/07/2024/4:16:44 PM    Final    MONITORS  CARDIAC EVENT MONITOR 04/12/2024  Narrative   30 day monitor. Data available from 70% of planned monitored time   Min HR 43, Max HR 100, avg HR 61   Rare supraventricular ectopy   Rare ventricular  ectopy   Symptoms correlated with sinus rhythm with rare PACs and PVCs   CT SCANS  CT CORONARY MORPH W/CTA COR W/SCORE  08/27/2024  Addendum 09/01/2024  9:18 AM ADDENDUM REPORT: 09/01/2024 09:15  EXAM: OVER-READ INTERPRETATION  CT CHEST  The following report is an over-read performed by radiologist Dr. Fonda Mom [facility] on 09/01/2024. This over-read does not include interpretation of cardiac or coronary anatomy or pathology. The coronary CTA interpretation by the cardiologist is attached.  COMPARISON:  None.  FINDINGS: Cardiovascular:  See findings discussed in the body of the report.  Mediastinum/Nodes: No suspicious adenopathy identified. Imaged mediastinal structures are unremarkable.  Lungs/Pleura: Imaged lungs are clear. No pleural effusion or pneumothorax.  Upper Abdomen: No acute abnormality.  Musculoskeletal: No chest wall abnormality. No acute osseous findings. There are thoracic degenerative changes.  IMPRESSION: No acute extracardiac incidental findings.   Electronically Signed By: Fonda Field M.D. On: 09/01/2024 09:15  Narrative CLINICAL DATA:  Chest pain  EXAM: Cardiac/Coronary CTA  TECHNIQUE: A non-contrast, gated CT scan was obtained with axial slices of 3 mm through the heart for calcium  scoring. Calcium  scoring was performed using the Agatston method. A 120 kV prospective, gated, contrast cardiac scan was obtained. Gantry rotation speed was 250 msecs and collimation was 0.6 mm. Two sublingual nitroglycerin  tablets (0.8 mg) were given. The 3D data set was reconstructed in 5% intervals of the 35-75% of the R-R cycle. Diastolic phases were analyzed on a dedicated workstation using MPR, MIP, and VRT modes. The patient received 95 cc of contrast.  FINDINGS: Image quality: Excellent.  Noise artifact is: Limited.  Coronary Arteries:  Normal coronary origin.  Right dominance.  Left main: The left main is a large caliber vessel with a normal take off from the left coronary cusp that trifurcates into a LAD, LCX, and ramus intermedius. There  is minimal mixed plaque in the proximal/distal LM (<25%).  Left anterior descending artery: The LAD gives off 2 patent diagonal branches. There is mild calcified plaque in the ostial/proximal LAD (25-49%). There is severe soft plaque in the distal LAD (70-99%)and then appears occluded. D1 is diffusely diseased with mild calcified plaque in the proximal vessel and moderate calcified plaque in the mid vessel (50-69%). D2 is poorly visualized.  Ramus intermedius: The ramus is diffusely diseased with mild calcified plaque in the proximal vessel (25-49% but could be>50%).  Left circumflex artery: The LCX is non-dominant and gives off 1 patent obtuse marginal branch. There is minimal mixed plaque in the ostial LCx (<25%). There is moderate mixed plaque in the proximal LCx at the takeoff of a small OM1 and distal LCx (50-69%). There is mild mixed plaque in the mid LCx (25-49%).  Right coronary artery: The RCA is dominant with normal take off from the right coronary cusp. There is mild mixed plaque in the proximal and mid RCA (25-49%). The RCA terminates as a PDA and right posterolateral branch. There is moderate calcified plaque in the mid PDA (50-69%).  Right Atrium: Right atrial size is within normal limits.  Right Ventricle: The right ventricular cavity is within normal limits.  Left Atrium: Left atrial size is normal in size with no left atrial appendage filling defect.  Left Ventricle: The ventricular cavity size is within normal limits.  Pulmonary arteries: Normal in size.  Pulmonary veins: Normal pulmonary venous drainage.  Pericardium: Normal thickness without significant effusion or calcium  presen  Cardiac valves: The aortic valve is trileaflet without significant calcification. The mitral  valve is normal without significant calcification.  Aorta: Mildly dilated aortic root measuring 39mm. Normal caliber ascending aorta.  Extra-cardiac findings: See attached  radiology report for non-cardiac structures.  IMPRESSION: 1. Coronary calcium  score of 1028. This was 97th percentile for age-, sex, and race-matched controls.  2. Total plaque volume 1148mm3 which is 91st percentile for age- and sex-matched controls (calcified plaque 231mm3; non-calcified plaque 851mm3). TPV is extensive.  3. Normal coronary origin with right dominance.  4. Severe atherosclerosis: 70-99% distal LAD (possbly occluded); 50-69% prox/distal LCx and mid D1/mid Ramus  5. Recommend cardiac catheterization.  6. Study has been submitted for FFR analysis.  RECOMMENDATIONS: 1. CAD-RADS 0: No evidence of CAD (0%). Consider non-atherosclerotic causes of chest pain.  2. CAD-RADS 1: Minimal non-obstructive CAD (0-24%). Consider non-atherosclerotic causes of chest pain. Consider preventive therapy and risk factor modification.  3. CAD-RADS 2: Mild non-obstructive CAD (25-49%). Consider non-atherosclerotic causes of chest pain. Consider preventive therapy and risk factor modification.  4. CAD-RADS 3: Moderate stenosis. Consider symptom-guided anti-ischemic pharmacotherapy as well as risk factor modification per guideline directed care. Additional analysis with CT FFR will be submitted.  5. CAD-RADS 4: Severe stenosis. (70-99% or > 50% left main). Cardiac catheterization or CT FFR is recommended. Consider symptom-guided anti-ischemic pharmacotherapy as well as risk factor modification per guideline directed care. Invasive coronary angiography recommended with revascularization per published guideline statements.  6. CAD-RADS 5: Total coronary occlusion (100%). Consider cardiac catheterization or viability assessment. Consider symptom-guided anti-ischemic pharmacotherapy as well as risk factor modification per guideline directed care.  7. CAD-RADS N: Non-diagnostic study. Obstructive CAD can't be excluded. Alternative evaluation is recommended.  Wilbert Bihari,  MD  Electronically Signed: By: Wilbert Bihari M.D. On: 08/28/2024 12:56     ______________________________________________________________________________________________       Current Reported Medications:.    No outpatient medications have been marked as taking for the 10/07/24 encounter (Appointment) with Kaire Stary D, NP.    Physical Exam:    VS:  There were no vitals taken for this visit.   Wt Readings from Last 3 Encounters:  09/19/24 240 lb (108.9 kg)  09/16/24 244 lb (110.7 kg)  09/10/24 243 lb (110.2 kg)    GEN: Well nourished, well developed in no acute distress NECK: No JVD; No carotid bruits CARDIAC: ***RRR, no murmurs, rubs, gallops RESPIRATORY:  Clear to auscultation without rales, wheezing or rhonchi  ABDOMEN: Soft, non-tender, non-distended EXTREMITIES:  No edema; No acute deformity     Asessement and Plan:.     ***     Disposition: F/u with ***  Signed, Saira Kramme D Audryana Hockenberry, NP

## 2024-10-07 ENCOUNTER — Ambulatory Visit: Admitting: Pharmacist

## 2024-10-07 ENCOUNTER — Ambulatory Visit: Admitting: Cardiology

## 2024-10-07 DIAGNOSIS — I10 Essential (primary) hypertension: Secondary | ICD-10-CM

## 2024-10-07 DIAGNOSIS — I5020 Unspecified systolic (congestive) heart failure: Secondary | ICD-10-CM

## 2024-10-07 DIAGNOSIS — I2510 Atherosclerotic heart disease of native coronary artery without angina pectoris: Secondary | ICD-10-CM

## 2024-10-07 NOTE — Progress Notes (Unsigned)
 Patient ID: Nicholas Delacruz                 DOB: [birth year]                      MRN: [id number]      HPI: Nicholas Delacruz is a 58 y.o. male referred by Dr. Elmira to HTN clinic. PMH is significant for HTN, HLD, T2DM, cardiomyopathy with EF 45-50%, CAD, h/o cocaine use, h/o stroke, OSA on CPAP, and GERD.   Recent LHC with significant stenosis in LAD artery. Taking dilt?? Could consider Entresto? And stop dilt/losart-hct  09/18/24 labs: K 4.5, Scr 0.84 (at [facility]),  Ask about home BP readings, check BP/HR  Current HTN meds: diltiazem  300 mg nightly, hydralazine  100 mg twice daily, labetalol  300 mg twice daily, losartan -hydrochlorothiazide  100-25 mg daily,  Previously tried: ?? BP goal: < 130/80 mmHg  Family History:  Family History  Problem Relation Age of Onset   Alzheimer's disease Father    Arthritis Unknown    Social History:   Diet:   Exercise:  {types:28256}  Home BP readings:  Date SBP/DBP  HR                              Average      Wt Readings from Last 3 Encounters:  09/19/24 240 lb (108.9 kg)  09/16/24 244 lb (110.7 kg)  09/10/24 243 lb (110.2 kg)   BP Readings from Last 3 Encounters:  09/19/24 (!) 151/80  09/16/24 132/70  09/10/24 (!) 164/92   Pulse Readings from Last 3 Encounters:  09/19/24 (!) 51  09/16/24 61  09/10/24 60    Renal function: CrCl cannot be calculated (Unknown ideal weight.).  Past Medical History:  Diagnosis Date   Arthritis    CAD (coronary artery disease)    CHF (congestive heart failure) (HCC)    Diabetes mellitus without complication (HCC)    Drug use    GERD (gastroesophageal reflux disease)    High blood pressure    Hyperlipidemia    Obstructive sleep apnea on CPAP    BIPAP   PONV (postoperative nausea and vomiting)    blood pressure dropped in recovery   Primary localized osteoarthritis of left knee 03/27/2018   Sleep apnea    Stroke [facility])     Current Outpatient Medications on File Prior to Visit   Medication Sig Dispense Refill   aspirin  EC 81 MG tablet Take 1 tablet (81 mg total) by mouth daily. Swallow whole. 150 tablet 2   atorvastatin  (LIPITOR) 80 MG tablet Take 1 tablet (80 mg total) by mouth daily. 90 tablet 3   clopidogrel  (PLAVIX ) 75 MG tablet Take 1 tablet (75 mg total) by mouth daily. 60 tablet 0   diltiazem  (CARDIZEM  CD) 300 MG 24 hr capsule Take 300 mg by mouth at bedtime.      escitalopram (LEXAPRO) 20 MG tablet Take 20 mg by mouth daily.     fluticasone  (FLONASE ) 50 MCG/ACT nasal spray Place 2 sprays into both nostrils daily.     gabapentin  (NEURONTIN ) 300 MG capsule Take 300 mg by mouth 2 (two) times daily. (Patient not taking: No sig reported)     hydrALAZINE  (APRESOLINE ) 100 MG tablet Take 1 tablet (100 mg total) by mouth 2 (two) times daily. (Patient taking differently: Take 50 mg by mouth daily.) 180 tablet 3   HYDROcodone -acetaminophen  (NORCO) 10-325 MG tablet Take 1  tablet by mouth every 4 (four) hours.     Iron, Ferrous Sulfate, 325 (65 Fe) MG TABS Take 325 mg by mouth daily. 30 tablet 5   labetalol  (NORMODYNE ) 300 MG tablet Take 300 mg by mouth 2 (two) times daily.     losartan -hydrochlorothiazide  (HYZAAR) 100-25 MG tablet Take 1 tablet by mouth daily.      metFORMIN  (GLUCOPHAGE ) 500 MG tablet Take 500 mg by mouth 2 (two) times daily.     mupirocin  ointment (BACTROBAN ) 2 % Apply 1 Application topically 2 (two) times daily. 22 g 0   nitroGLYCERIN  (NITROSTAT ) 0.4 MG SL tablet Place 1 tablet (0.4 mg total) under the tongue every 5 (five) minutes as needed for chest pain. 25 tablet 3   omeprazole (PRILOSEC) 40 MG capsule Take 40 mg by mouth daily.      ondansetron  (ZOFRAN ) 4 MG tablet Take 1 tablet (4 mg total) by mouth every 8 (eight) hours as needed for nausea or vomiting. 10 tablet 0   Semaglutide ,0.25 or 0.5MG /DOS, (OZEMPIC , 0.25 OR 0.5 MG/DOSE,) 2 MG/3ML SOPN Inject 0.25 mg into the skin once a week. 3 mL 0   zolpidem  (AMBIEN ) 10 MG tablet Take 10 mg by mouth at  bedtime.      No current facility-administered medications on file prior to visit.    Allergies  Allergen Reactions   Levofloxacin Anaphylaxis    There were no vitals taken for this visit.   Assessment/Plan: No BP recorded.  {Refresh Note OR Click here to enter BP  :1}***   1. Hypertension -  No problem-specific Assessment & Plan notes found for this encounter.      Thank you  Nidia Schaffer, PharmD PGY2 Cardiology Pharmacy Resident

## 2024-10-08 ENCOUNTER — Other Ambulatory Visit: Payer: Self-pay | Admitting: Family Medicine

## 2024-10-08 MED ORDER — ZOLPIDEM TARTRATE 10 MG PO TABS: 10.0000 mg | ORAL_TABLET | Freq: Every day | ORAL | 2 refills | Status: DC

## 2024-10-09 ENCOUNTER — Other Ambulatory Visit: Payer: Self-pay | Admitting: Cardiology

## 2024-10-09 ENCOUNTER — Telehealth: Payer: Self-pay | Admitting: Diagnostic Neuroimaging

## 2024-10-09 NOTE — Telephone Encounter (Signed)
 Spoke with patient and discussed Dr Chancy most recent instructions which had included to stop plavix  after taking for 2 more months post appt (3 months total with aspirin ) and then continue with aspirin  81 mg daily. Pt stated Dr Wonda performed a heart cath and then kept him on the Plavix . He is also taking aspirin  81 mg daily. He stated he was supposed to see cardiology 2 days ago but wasn't feeling well and needs to r/s. He realized during the call that he had called the wrong office. I gave him the [facility] office number and he will call them to clarify his Plavix  questions and r/s his appt. He thanked me for the call.

## 2024-10-09 NOTE — Telephone Encounter (Signed)
 Pt is requesting a refill on medication clopidogrel . Dr. Elmira did no prescribe this medication. Would Dr. Elmira like to refill this medication? Please address

## 2024-10-09 NOTE — Telephone Encounter (Signed)
 Pt is requesting a refill for clopidogrel  (PLAVIX ) 75 MG tablet. He is completely out, took last pill today, please send to  Pharmacy: [facility]

## 2024-10-09 NOTE — Telephone Encounter (Signed)
*  STAT* If patient is at the pharmacy, call can be transferred to refill team.   1. Which medications need to be refilled? (please list name of each medication and dose if known) clopidogrel  (PLAVIX ) 75 MG tablet    2. Would you like to learn more about the convenience, safety, & potential cost savings by using the [facility]? No   3. Are you open to using the [facility] (Type [facility]. ) No   4. Which pharmacy/location (including street and city if local pharmacy) is medication to be sent to?  [facility] - [place], [place] - [id number]    5. Do they need a 30 day or 90 day supply? 90 day

## 2024-10-09 NOTE — Telephone Encounter (Signed)
 Reviewed last visit note from June:  Instructions   Return for return to PCP, pending if symptoms worsen or fail to improve.   LEFT corona radiata ischemic infarction (03/28/24; chronic left cerebellar infarction) - aspirin  81 + plavix  75 x 3 months, then aspirin  81 alone (patient completed 21 days; will renew for 2 more months of clopidogrel  to complete ~3 months) - continue atorvastatin  80 - continue diabetes control - improved BP control (172/82 today) - follow up with PCP and cardiology - recommend long term implanted loop recorder (rule out afib)

## 2024-10-10 NOTE — Progress Notes (Signed)
 Cardiology Office Note    Date:  10/10/2024  ID:  OHM DENTLER, DOB 11-26-1966, MRN [id number] PCP:  Duanne Butler DASEN, MD  Cardiologist:  Newman JINNY Lawrence, MD  Electrophysiologist:  None   Chief Complaint:    History of Present Illness: Nicholas    [name] is a 58 y.o. male with history of hypertension, hyperlipidemia, type II DM, cardiomyopathy EF 45 to 50%, history of cocaine abuse, history of stroke, OSA on CPAP and GERD.  Patient was hospitalized in 03/2024 for 2 days with confusion.  Workup showed acute left hemisphere lacunar infarct without large vessel occlusion.  Echo showed mild reduction in LVEF 40 to 45% with global hypokinesis.  This was thought to be related to history of cocaine abuse and found to be positive for cocaine testing during his hospitalization.  Office visit in 05/2024 LVEF remained low were working to optimize GDMT for HFrEF.  Coronary CT angiogram was ordered to evaluate an obstructive CAD as etiology for low EF.  On 08/28/2024 underwent coronary CTA which showed a lesion in the distal LCx that was positive for FFR.  Cardiac catheterization was recommended.  Patient underwent LHC on 09/19/2024 that showed widely patent left main with no stenosis, diffusely diseased LAD/diagonal with distal LAD occlusion and collaterals R>L supplying the apical LAD and distal diagonal territories, nonobstructive LCx plaquing, patent dominant RCA with severe PDA stenosis, medical therapy was recommended for CAD.  Today he presents for follow-up.  He r  ROS: .      Studies Reviewed: .         CV Studies: Cardiac studies reviewed are outlined and summarized above. Otherwise please see EMR for full report. Cardiac Studies & Procedures   ______________________________________________________________________________________________ CARDIAC CATHETERIZATION  CARDIAC CATHETERIZATION 09/19/2024  Conclusion Widely patent left main with no stenosis Diffusely diseased  LAD/diagonal, with distal LAD occlusion and collaterals (R--->L) supplying the apical LAD and distal diagonal territories Nonobstructive LCx plaquing Patent, dominant RCA with severe PDA stenosis 5.   Normal LVEDP  Recommend: medical therapy for CAD  Findings Coronary Findings Diagnostic  Dominance: Right  Left Anterior Descending Collaterals Dist LAD filled by collaterals from RPDA.  Prox LAD to Mid LAD lesion is 70% stenosed. Dist LAD lesion is 100% stenosed.  First Diagonal Branch Collaterals 1st Diag filled by collaterals from RPDA.  1st Diag lesion is 70% stenosed.  Ramus Intermedius Ramus lesion is 50% stenosed.  Right Coronary Artery  Right Posterior Descending Artery RPDA lesion is 80% stenosed.  Intervention  No interventions have been documented.     ECHOCARDIOGRAM  ECHOCARDIOGRAM COMPLETE 08/07/2024  Narrative ECHOCARDIOGRAM REPORT    Patient Name:   Nicholas Delacruz Date of Exam: 08/07/2024 Medical Rec #:  [id number]        Height:       72.0 in Accession #:    [id number]       Weight:       246.0 lb Date of Birth:  [birth year]         BSA:          2.327 m Patient Age:    57 years         BP:           176/91 mmHg Patient Gender: M                HR:           52 bpm. Exam Location:  [facility]  Procedure:  2D Echo, Cardiac Doppler, Color Doppler and 3D Echo (Both Spectral and Color Flow Doppler were utilized during procedure).  Indications:    Heart Failure with reduced ejection fraction. I50.20  History:        Patient has prior history of Echocardiogram examinations, most recent 03/28/2024. Risk Factors:Diabetes.  Sonographer:    Augustin Seals RDCS Referring Phys: [id number] [facility] J PATWARDHAN  IMPRESSIONS   1. No LV thrombus seen on apical views. Left ventricular ejection fraction, by estimation, is 40 to 45%. Left ventricular ejection fraction by 3D volume is 44 %. The left ventricle has mildly decreased function. The left ventricle  demonstrates global hypokinesis. The left ventricular internal cavity size was mildly to moderately dilated. Left ventricular diastolic parameters are consistent with Grade II diastolic dysfunction (pseudonormalization). 2. Right ventricular systolic function is normal. The right ventricular size is normal. Tricuspid regurgitation signal is inadequate for assessing PA pressure. 3. Left atrial size was mildly dilated. 4. The mitral valve is normal in structure. No evidence of mitral valve regurgitation. No evidence of mitral stenosis. 5. The aortic valve is tricuspid. Aortic valve regurgitation is not visualized. No aortic stenosis is present. 6. The inferior vena cava is normal in size with <50% respiratory variability, suggesting right atrial pressure of 8 mmHg.  FINDINGS Left Ventricle: No LV thrombus seen on apical views. Left ventricular ejection fraction, by estimation, is 40 to 45%. Left ventricular ejection fraction by 3D volume is 44 %. The left ventricle has mildly decreased function. The left ventricle demonstrates global hypokinesis. The left ventricular internal cavity size was mildly to moderately dilated. There is no left ventricular hypertrophy. Left ventricular diastolic parameters are consistent with Grade II diastolic dysfunction (pseudonormalization).  Right Ventricle: The right ventricular size is normal. No increase in right ventricular wall thickness. Right ventricular systolic function is normal. Tricuspid regurgitation signal is inadequate for assessing PA pressure.  Left Atrium: Left atrial size was mildly dilated.  Right Atrium: Right atrial size was normal in size.  Pericardium: There is no evidence of pericardial effusion.  Mitral Valve: The mitral valve is normal in structure. No evidence of mitral valve regurgitation. No evidence of mitral valve stenosis.  Tricuspid Valve: The tricuspid valve is normal in structure. Tricuspid valve regurgitation is trivial. No  evidence of tricuspid stenosis.  Aortic Valve: The aortic valve is tricuspid. Aortic valve regurgitation is not visualized. No aortic stenosis is present.  Pulmonic Valve: The pulmonic valve was normal in structure. Pulmonic valve regurgitation is not visualized. No evidence of pulmonic stenosis.  Aorta: The aortic root is normal in size and structure.  Venous: The inferior vena cava is normal in size with less than 50% respiratory variability, suggesting right atrial pressure of 8 mmHg.  IAS/Shunts: No atrial level shunt detected by color flow Doppler.  Additional Comments: 3D was performed not requiring image post processing on an independent workstation and was abnormal.   LEFT VENTRICLE PLAX 2D LVIDd:         6.20 cm         Diastology LVIDs:         5.30 cm         LV e' medial:    5.48 cm/s LV PW:         1.20 cm         LV E/e' medial:  18.1 LV IVS:        1.10 cm         LV e' lateral:  7.65 cm/s LVOT diam:     2.40 cm         LV E/e' lateral: 12.9 LV SV:         143 LV SV Index:   61 LVOT Area:     4.52 cm        3D Volume EF LV 3D EF:    Left ventricul ar ejection fraction by 3D volume is 44 %.  3D Volume EF: 3D EF:        44 % LV EDV:       340 ml LV ESV:       192 ml LV SV:        148 ml  RIGHT VENTRICLE             IVC RV Basal diam:  3.40 cm     IVC diam: 1.90 cm RV Mid diam:    3.20 cm RV S prime:     13.10 cm/s TAPSE (M-mode): 2.7 cm  LEFT ATRIUM             Index        RIGHT ATRIUM           Index LA diam:        4.80 cm 2.06 cm/m   RA Area:     20.80 cm LA Vol (A2C):   83.3 ml 35.80 ml/m  RA Volume:   60.30 ml  25.92 ml/m LA Vol (A4C):   85.0 ml 36.54 ml/m LA Biplane Vol: 84.2 ml 36.19 ml/m AORTIC VALVE LVOT Vmax:   147.00 cm/s LVOT Vmean:  95.000 cm/s LVOT VTI:    0.316 m  AORTA Ao Root diam: 3.80 cm Ao Asc diam:  3.70 cm  MITRAL VALVE MV Area (PHT): 2.87 cm    SHUNTS MV Decel Time: 264 msec    Systemic VTI:  0.32 m MV E  velocity: 99.00 cm/s  Systemic Diam: 2.40 cm MV A velocity: 62.40 cm/s MV E/A ratio:  1.59  Morene Brownie Electronically signed by Morene Brownie Signature Date/Time: 08/07/2024/4:16:44 PM    Final    MONITORS  CARDIAC EVENT MONITOR 04/12/2024  Narrative   30 day monitor. Data available from 70% of planned monitored time   Min HR 43, Max HR 100, avg HR 61   Rare supraventricular ectopy   Rare ventricular ectopy   Symptoms correlated with sinus rhythm with rare PACs and PVCs   CT SCANS  CT CORONARY MORPH W/CTA COR W/SCORE 08/27/2024  Addendum 09/01/2024  9:18 AM ADDENDUM REPORT: 09/01/2024 09:15  EXAM: OVER-READ INTERPRETATION  CT CHEST  The following report is an over-read performed by radiologist Dr. Fonda Mom [facility] on 09/01/2024. This over-read does not include interpretation of cardiac or coronary anatomy or pathology. The coronary CTA interpretation by the cardiologist is attached.  COMPARISON:  None.  FINDINGS: Cardiovascular:  See findings discussed in the body of the report.  Mediastinum/Nodes: No suspicious adenopathy identified. Imaged mediastinal structures are unremarkable.  Lungs/Pleura: Imaged lungs are clear. No pleural effusion or pneumothorax.  Upper Abdomen: No acute abnormality.  Musculoskeletal: No chest wall abnormality. No acute osseous findings. There are thoracic degenerative changes.  IMPRESSION: No acute extracardiac incidental findings.   Electronically Signed By: Fonda Field M.D. On: 09/01/2024 09:15  Narrative CLINICAL DATA:  Chest pain  EXAM: Cardiac/Coronary CTA  TECHNIQUE: A non-contrast, gated CT scan was obtained with axial slices of 3 mm through the heart for calcium  scoring. Calcium  scoring was  performed using the Agatston method. A 120 kV prospective, gated, contrast cardiac scan was obtained. Gantry rotation speed was 250 msecs and collimation was 0.6 mm. Two sublingual  nitroglycerin  tablets (0.8 mg) were given. The 3D data set was reconstructed in 5% intervals of the 35-75% of the R-R cycle. Diastolic phases were analyzed on a dedicated workstation using MPR, MIP, and VRT modes. The patient received 95 cc of contrast.  FINDINGS: Image quality: Excellent.  Noise artifact is: Limited.  Coronary Arteries:  Normal coronary origin.  Right dominance.  Left main: The left main is a large caliber vessel with a normal take off from the left coronary cusp that trifurcates into a LAD, LCX, and ramus intermedius. There is minimal mixed plaque in the proximal/distal LM (<25%).  Left anterior descending artery: The LAD gives off 2 patent diagonal branches. There is mild calcified plaque in the ostial/proximal LAD (25-49%). There is severe soft plaque in the distal LAD (70-99%)and then appears occluded. D1 is diffusely diseased with mild calcified plaque in the proximal vessel and moderate calcified plaque in the mid vessel (50-69%). D2 is poorly visualized.  Ramus intermedius: The ramus is diffusely diseased with mild calcified plaque in the proximal vessel (25-49% but could be>50%).  Left circumflex artery: The LCX is non-dominant and gives off 1 patent obtuse marginal branch. There is minimal mixed plaque in the ostial LCx (<25%). There is moderate mixed plaque in the proximal LCx at the takeoff of a small OM1 and distal LCx (50-69%). There is mild mixed plaque in the mid LCx (25-49%).  Right coronary artery: The RCA is dominant with normal take off from the right coronary cusp. There is mild mixed plaque in the proximal and mid RCA (25-49%). The RCA terminates as a PDA and right posterolateral branch. There is moderate calcified plaque in the mid PDA (50-69%).  Right Atrium: Right atrial size is within normal limits.  Right Ventricle: The right ventricular cavity is within normal limits.  Left Atrium: Left atrial size is normal in size with no  left atrial appendage filling defect.  Left Ventricle: The ventricular cavity size is within normal limits.  Pulmonary arteries: Normal in size.  Pulmonary veins: Normal pulmonary venous drainage.  Pericardium: Normal thickness without significant effusion or calcium  presen  Cardiac valves: The aortic valve is trileaflet without significant calcification. The mitral valve is normal without significant calcification.  Aorta: Mildly dilated aortic root measuring 39mm. Normal caliber ascending aorta.  Extra-cardiac findings: See attached radiology report for non-cardiac structures.  IMPRESSION: 1. Coronary calcium  score of 1028. This was 97th percentile for age-, sex, and race-matched controls.  2. Total plaque volume 1179mm3 which is 91st percentile for age- and sex-matched controls (calcified plaque 265mm3; non-calcified plaque 854mm3). TPV is extensive.  3. Normal coronary origin with right dominance.  4. Severe atherosclerosis: 70-99% distal LAD (possbly occluded); 50-69% prox/distal LCx and mid D1/mid Ramus  5. Recommend cardiac catheterization.  6. Study has been submitted for FFR analysis.  RECOMMENDATIONS: 1. CAD-RADS 0: No evidence of CAD (0%). Consider non-atherosclerotic causes of chest pain.  2. CAD-RADS 1: Minimal non-obstructive CAD (0-24%). Consider non-atherosclerotic causes of chest pain. Consider preventive therapy and risk factor modification.  3. CAD-RADS 2: Mild non-obstructive CAD (25-49%). Consider non-atherosclerotic causes of chest pain. Consider preventive therapy and risk factor modification.  4. CAD-RADS 3: Moderate stenosis. Consider symptom-guided anti-ischemic pharmacotherapy as well as risk factor modification per guideline directed care. Additional analysis with CT FFR will be submitted.  5.  CAD-RADS 4: Severe stenosis. (70-99% or > 50% left main). Cardiac catheterization or CT FFR is recommended. Consider  symptom-guided anti-ischemic pharmacotherapy as well as risk factor modification per guideline directed care. Invasive coronary angiography recommended with revascularization per published guideline statements.  6. CAD-RADS 5: Total coronary occlusion (100%). Consider cardiac catheterization or viability assessment. Consider symptom-guided anti-ischemic pharmacotherapy as well as risk factor modification per guideline directed care.  7. CAD-RADS N: Non-diagnostic study. Obstructive CAD can't be excluded. Alternative evaluation is recommended.  Wilbert Bihari, MD  Electronically Signed: By: Wilbert Bihari M.D. On: 08/28/2024 12:56     ______________________________________________________________________________________________       Current Reported Medications:.    No outpatient medications have been marked as taking for the 10/21/24 encounter (Appointment) with Parthenia Olivia HERO, PA-C.    Physical Exam:    VS:  There were no vitals taken for this visit.   Wt Readings from Last 3 Encounters:  09/19/24 240 lb (108.9 kg)  09/16/24 244 lb (110.7 kg)  09/10/24 243 lb (110.2 kg)    GEN: Well nourished, well developed in no acute distress NECK: No JVD; No carotid bruits CARDIAC:  RRR, no murmurs, rubs, gallops RESPIRATORY:  Clear to auscultation without rales, wheezing or rhonchi  ABDOMEN: Soft, non-tender, non-distended EXTREMITIES:  No edema; No acute deformity     Asessement and Plan:.     CAD: Patient noted to have severe LAD stenosis, CT.  Moderate stenosis in other arteries.  Patient underwent cardiac catheterization on 09/19/2024 that showed widely patent left main with no stenosis, diffusely diseased LAD/diagonal with distal LAD occlusion and collaterals R>L supplying the apical LAD and distal diagonal territories, nonobstructive LCx plaquing, patent dominant RCA with severe PDA stenosis, medical therapy was recommended for CAD.   Heart failure mildly reduced EF:  Echocardiogram on 08/07/2024 indicated LVEF 40 to 45%, LV with mildly decreased function, LV demonstrates global hypokinesis, LV internal cavity size was mildly to moderately dilated, diastolic parameters consistent with G2 DD, RV systolic function and size was normal, LA size is mildly dilated, no evidence of mitral regurgitation or stenosis.   Hypertension: Blood pressure today  Hyperlipidemia: Last lipid profile on  Type 2 DM:       Disposition: F/u with    Signed, Olivia Parthenia, PA-C

## 2024-10-11 ENCOUNTER — Other Ambulatory Visit (HOSPITAL_COMMUNITY): Payer: Self-pay

## 2024-10-11 MED ORDER — CLOPIDOGREL BISULFATE 75 MG PO TABS
75.0000 mg | ORAL_TABLET | Freq: Every day | ORAL | 3 refills | Status: AC
Start: 2024-10-11 — End: ?
  Filled 2024-10-11 – 2024-11-11 (×2): qty 90, 90d supply, fill #0

## 2024-10-11 NOTE — Telephone Encounter (Signed)
 I am signing off Plavix , but will need to address changing omeprazole to pantoprazole  at upcoming office visit, along with stopping Aspirin .  Thanks MJP

## 2024-10-11 NOTE — Telephone Encounter (Signed)
 Reviewed. Prior stroke, diffuse CAD. Recommenced continuing Plavix  monotherapy, okay to discontinue Aspirin . Does not need DAPT anymore.   Rosaline, he is seeing you on 10/27, just FYI. This cam be addressed then.   Thanks MJP

## 2024-10-21 ENCOUNTER — Ambulatory Visit: Attending: Physician Assistant | Admitting: Physician Assistant

## 2024-10-22 ENCOUNTER — Other Ambulatory Visit (HOSPITAL_COMMUNITY): Payer: Self-pay

## 2024-10-28 NOTE — Patient Instructions (Incomplete)

## 2024-10-30 ENCOUNTER — Ambulatory Visit: Admitting: Nurse Practitioner

## 2024-10-30 DIAGNOSIS — Z7985 Long-term (current) use of injectable non-insulin antidiabetic drugs: Secondary | ICD-10-CM

## 2024-10-30 DIAGNOSIS — E782 Mixed hyperlipidemia: Secondary | ICD-10-CM

## 2024-10-30 DIAGNOSIS — I10 Essential (primary) hypertension: Secondary | ICD-10-CM

## 2024-10-30 DIAGNOSIS — E1165 Type 2 diabetes mellitus with hyperglycemia: Secondary | ICD-10-CM

## 2024-10-30 DIAGNOSIS — Z7984 Long term (current) use of oral hypoglycemic drugs: Secondary | ICD-10-CM

## 2024-10-30 NOTE — Progress Notes (Deleted)
 Endocrinology Consult Note       10/30/2024, 7:42 AM   Subjective:    Patient ID: Nicholas Delacruz, male    DOB: Dec 12, 1966.  Nicholas Delacruz is being seen in consultation for management of currently uncontrolled symptomatic diabetes requested by  Duanne Butler DASEN, MD.   Past Medical History:  Diagnosis Date   Arthritis    CAD (coronary artery disease)    CHF (congestive heart failure) (HCC)    Diabetes mellitus without complication (HCC)    Drug use    GERD (gastroesophageal reflux disease)    High blood pressure    Hyperlipidemia    Obstructive sleep apnea on CPAP    BIPAP   PONV (postoperative nausea and vomiting)    blood pressure dropped in recovery   Primary localized osteoarthritis of left knee 03/27/2018   Sleep apnea    Stroke [facility])     Past Surgical History:  Procedure Laterality Date   APPENDECTOMY     KNEE ARTHROPLASTY     KNEE ARTHROSCOPY Right 01/02/2015   Procedure: RIGHT KNEE ARTHROSCOPY, LATERAL AND MEDIAL MENISECTOMY;  Surgeon: Taft FORBES Minerva, MD;  Location: [facility];  Service: Orthopedics;  Laterality: Right;   KNEE ARTHROSCOPY WITH MEDIAL MENISECTOMY Left 06/25/2014   Procedure: KNEE ARTHROSCOPY WITH MEDIAL MENISECTOMY;  Surgeon: Taft FORBES Minerva, MD;  Location: [facility];  Service: Orthopedics;  Laterality: Left;   KNEE SURGERY Left    LEFT HEART CATH AND CORONARY ANGIOGRAPHY N/A 09/19/2024   Procedure: LEFT HEART CATH AND CORONARY ANGIOGRAPHY;  Surgeon: Wonda Sharper, MD;  Location: [facility] INVASIVE CV LAB;  Service: Cardiovascular;  Laterality: N/A;   MENISCUS DEBRIDEMENT Right 01/02/2015   Procedure: DEBRIDEMENT OF RIGHT KNEE JOINT;  Surgeon: Taft FORBES Minerva, MD;  Location: [facility];  Service: Orthopedics;  Laterality: Right;   PARTIAL KNEE ARTHROPLASTY Left 03/27/2018   Procedure: UNICOMPARTMENTAL LEFT KNEE;  Surgeon: Josefina Chew, MD;  Location: [facility] OR;  Service: Orthopedics;   Laterality: Left;   right knee sark     SHOULDER SURGERY     left   TOTAL KNEE ARTHROPLASTY Right 08/30/2016   Procedure: TOTAL KNEE ARTHROPLASTY;  Surgeon: Chew Josefina, MD;  Location: [facility] OR;  Service: Orthopedics;  Laterality: Right;   TOTAL SHOULDER ARTHROPLASTY      Social History   Socioeconomic History   Marital status: Divorced    Spouse name: Not on file   Number of children: Not on file   Years of education: 14 th grad   Highest education level: Not on file  Occupational History   Occupation: [employer]  Tobacco Use   Smoking status: Never   Smokeless tobacco: Never  Vaping Use   Vaping status: Never Used  Substance and Sexual Activity   Alcohol use: Yes    Comment: socially   Drug use: No   Sexual activity: Yes    Birth control/protection: None  Other Topics Concern   Not on file  Social History Narrative   Not on file   Social Drivers of Health   Financial Resource Strain: Not on file  Food Insecurity: No Food Insecurity (03/28/2024)   Hunger Vital Sign  Worried About Programme Researcher, Broadcasting/film/video in the Last Year: Never true    Ran Out of Food in the Last Year: Never true  Transportation Needs: No Transportation Needs (03/28/2024)   PRAPARE - Administrator, Civil Service (Medical): No    Lack of Transportation (Non-Medical): No  Physical Activity: Not on file  Stress: Not on file  Social Connections: Unknown (03/28/2024)   Social Connection and Isolation Panel    Frequency of Communication with Friends and Family: Not on file    Frequency of Social Gatherings with Friends and Family: Not on file    Attends Religious Services: Not on file    Active Member of Clubs or Organizations: Not on file    Attends Banker Meetings: Not on file    Marital Status: Divorced    Family History  Problem Relation Age of Onset   Alzheimer's disease Father    Arthritis Unknown     Outpatient Encounter Medications as of 10/30/2024  Medication  Sig   aspirin  EC 81 MG tablet Take 1 tablet (81 mg total) by mouth daily. Swallow whole.   atorvastatin  (LIPITOR) 80 MG tablet Take 1 tablet (80 mg total) by mouth daily.   clopidogrel  (PLAVIX ) 75 MG tablet Take 1 tablet (75 mg total) by mouth daily.   diltiazem  (CARDIZEM  CD) 300 MG 24 hr capsule Take 300 mg by mouth at bedtime.    escitalopram (LEXAPRO) 20 MG tablet Take 20 mg by mouth daily.   fluticasone  (FLONASE ) 50 MCG/ACT nasal spray Place 2 sprays into both nostrils daily.   gabapentin  (NEURONTIN ) 300 MG capsule Take 300 mg by mouth 2 (two) times daily. (Patient not taking: No sig reported)   hydrALAZINE  (APRESOLINE ) 100 MG tablet Take 1 tablet (100 mg total) by mouth 2 (two) times daily. (Patient taking differently: Take 50 mg by mouth daily.)   Iron, Ferrous Sulfate, 325 (65 Fe) MG TABS Take 325 mg by mouth daily.   labetalol  (NORMODYNE ) 300 MG tablet Take 300 mg by mouth 2 (two) times daily.   losartan -hydrochlorothiazide  (HYZAAR) 100-25 MG tablet Take 1 tablet by mouth daily.    metFORMIN  (GLUCOPHAGE ) 500 MG tablet Take 500 mg by mouth 2 (two) times daily.   mupirocin  ointment (BACTROBAN ) 2 % Apply 1 Application topically 2 (two) times daily.   nitroGLYCERIN  (NITROSTAT ) 0.4 MG SL tablet Place 1 tablet (0.4 mg total) under the tongue every 5 (five) minutes as needed for chest pain.   omeprazole (PRILOSEC) 40 MG capsule Take 40 mg by mouth daily.    ondansetron  (ZOFRAN ) 4 MG tablet Take 1 tablet (4 mg total) by mouth every 8 (eight) hours as needed for nausea or vomiting.   Semaglutide ,0.25 or 0.5MG /DOS, (OZEMPIC , 0.25 OR 0.5 MG/DOSE,) 2 MG/3ML SOPN Inject 0.25 mg into the skin once a week.   zolpidem  (AMBIEN ) 10 MG tablet Take 1 tablet (10 mg total) by mouth at bedtime.   No facility-administered encounter medications on file as of 10/30/2024.    ALLERGIES: Allergies  Allergen Reactions   Levofloxacin Anaphylaxis    VACCINATION STATUS: Immunization History  Administered  Date(s) Administered   Influenza Inj Mdck Quad Pf 10/22/2018   Influenza Inj Mdck Quad With Preservative 10/16/2017   Moderna SARS-COV2 Booster Vaccination 12/03/2020   Moderna Sars-Covid-2 Vaccination 02/29/2020, 04/01/2020    Diabetes He presents for his initial diabetic visit. He has type 2 diabetes mellitus. His disease course has been fluctuating. There are no hypoglycemic associated symptoms. There are  no hypoglycemic complications. Diabetic complications include heart disease (CHF). Risk factors for coronary artery disease include diabetes mellitus, dyslipidemia, family history, hypertension, male sex, obesity and sedentary lifestyle. Current diabetic treatment includes oral agent (monotherapy) (and Ozempic ). His weight is fluctuating minimally. He is following a generally unhealthy diet. When asked about meal planning, he reported none. He has not had a previous visit with a dietitian. He participates in exercise intermittently. An ACE inhibitor/angiotensin II receptor blocker is being taken. He does not see a podiatrist.Eye exam is current.     Review of systems  Constitutional: + Minimally fluctuating body weight, current There is no height or weight on file to calculate BMI., no fatigue, no subjective hyperthermia, no subjective hypothermia Eyes: no blurry vision, no xerophthalmia ENT: no sore throat, no nodules palpated in throat, no dysphagia/odynophagia, no hoarseness Cardiovascular: no chest pain, no shortness of breath, no palpitations, no leg swelling Respiratory: no cough, no shortness of breath Gastrointestinal: no nausea/vomiting/diarrhea Musculoskeletal: no muscle/joint aches Skin: no rashes, no hyperemia Neurological: no tremors, no numbness, no tingling, no dizziness Psychiatric: no depression, no anxiety  Objective:     There were no vitals taken for this visit.  Wt Readings from Last 3 Encounters:  09/19/24 240 lb (108.9 kg)  09/16/24 244 lb (110.7 kg)   09/10/24 243 lb (110.2 kg)     BP Readings from Last 3 Encounters:  09/19/24 (!) 151/80  09/16/24 132/70  09/10/24 (!) 164/92     Physical Exam- Limited  Constitutional:  There is no height or weight on file to calculate BMI. , not in acute distress, normal state of mind Eyes:  EOMI, no exophthalmos Neck: Supple Cardiovascular: RRR, no murmurs, rubs, or gallops, no edema Respiratory: Adequate breathing efforts, no crackles, rales, rhonchi, or wheezing Musculoskeletal: no gross deformities, strength intact in all four extremities, no gross restriction of joint movements Skin:  no rashes, no hyperemia Neurological: no tremor with outstretched hands   Diabetic Foot Exam - Simple   No data filed      CMP ( most recent) CMP     Component Value Date/Time   NA 137 09/18/2024 1516   K 4.5 09/18/2024 1516   CL 99 09/18/2024 1516   CO2 24 09/18/2024 1516   GLUCOSE 185 (H) 09/18/2024 1516   GLUCOSE 213 (H) 09/16/2024 1433   BUN 11 09/18/2024 1516   CREATININE 0.84 09/18/2024 1516   CREATININE 0.81 09/16/2024 1433   CALCIUM  9.8 09/18/2024 1516   PROT 7.0 09/16/2024 1433   ALBUMIN 3.4 (L) 03/28/2024 0055   AST 12 09/16/2024 1433   ALT 13 09/16/2024 1433   ALKPHOS 51 03/28/2024 0055   BILITOT 0.4 09/16/2024 1433   EGFR 100 09/18/2024 1516   GFRNONAA >60 03/28/2024 0055     Diabetic Labs (most recent): Lab Results  Component Value Date   HGBA1C 8.1 (H) 09/16/2024   HGBA1C 8.5 (H) 03/28/2024   HGBA1C 6.8 (H) 03/21/2018   MICROALBUR 5.1 09/16/2024     Lipid Panel ( most recent) Lipid Panel     Component Value Date/Time   CHOL 102 09/16/2024 1433   CHOL 121 07/11/2024 1053   TRIG 57 09/16/2024 1433   HDL 47 09/16/2024 1433   HDL 38 (L) 07/11/2024 1053   CHOLHDL 2.2 09/16/2024 1433   VLDL 29 03/28/2024 0305   LDLCALC 42 09/16/2024 1433   LABVLDL 16 07/11/2024 1053      No results found for: TSH, FREET4  Assessment & Plan:   1) Type 2  diabetes mellitus with hyperglycemia, without long-term current use of insulin  (HCC) (Primary)    - Steffan DELENA Boom has currently uncontrolled symptomatic type 2 DM since *** years of age, with most recent A1c of 8.1 %.   -Recent labs reviewed.  - I had a long discussion with him about the progressive nature of diabetes and the pathology behind its complications. -his diabetes is complicated by CHF, drug use and he remains at a high risk for more acute and chronic complications which include CAD, CVA, CKD, retinopathy, and neuropathy. These are all discussed in detail with him.  The following Lifestyle Medicine recommendations according to American College of Lifestyle Medicine [facility]) were discussed and offered to patient and he agrees to start the journey:  A. Whole Foods, Plant-based plate comprising of fruits and vegetables, plant-based proteins, whole-grain carbohydrates was discussed in detail with the patient.   A list for source of those nutrients were also provided to the patient.  Patient will use only water  or unsweetened tea for hydration. B.  The need to stay away from risky substances including alcohol, smoking; obtaining 7 to 9 hours of restorative sleep, at least 150 minutes of moderate intensity exercise weekly, the importance of healthy social connections,  and stress reduction techniques were discussed. C.  A full color page of  Calorie density of various food groups per pound showing examples of each food groups was provided to the patient.  - I have counseled him on diet and weight management by adopting a carbohydrate restricted/protein rich diet. Patient is encouraged to switch to unprocessed or minimally processed complex starch and increased protein intake (animal or plant source), fruits, and vegetables. -  he is advised to stick to a routine mealtimes to eat 3 meals a day and avoid unnecessary snacks (to snack only to correct hypoglycemia).   - he acknowledges that there  is a room for improvement in his food and drink choices. - Suggestion is made for him to avoid simple carbohydrates from his diet including Cakes, Sweet Desserts, Ice Cream, Soda (diet and regular), Sweet Tea, Candies, Chips, Cookies, Store Bought Juices, Alcohol in Excess of 1-2 drinks a day, Artificial Sweeteners, Coffee Creamer, and Sugar-free Products. This will help patient to have more stable blood glucose profile and potentially avoid unintended weight gain.  - he will be scheduled with Penny Crumpton, RDN, CDE for diabetes education.  - I have approached him with the following individualized plan to manage his diabetes and patient agrees:    -he is encouraged to start/continue monitoring glucose 1 times daily, before breakfast to log their readings on the clinic sheets provided, and bring them to review at follow up appointment in 3 months.  - Adjustment parameters are given to him for hypo and hyperglycemia in writing. - he is encouraged to call clinic for blood glucose levels less than 70 or above 300 mg /dl. - he is advised to continue ***, therapeutically suitable for patient . - his *** will be discontinued, risk outweighs benefit for this patient.  - Specific targets for  A1c; LDL, HDL, and Triglycerides were discussed with the patient.  2) Blood Pressure /Hypertension:  his blood pressure is controlled to target.   he is advised to continue his current medications as prescribed by PCP.  3) Lipids/Hyperlipidemia:    Review of his recent lipid panel from 09/18/24 showed controlled LDL at 42.  he is advised to continue Atorvastatin  80  mg daily at bedtime.  Side effects and precautions discussed with him.  4)  Weight/Diet:  his There is no height or weight on file to calculate BMI.  -  clearly complicating his diabetes care.   he is a candidate for weight loss. I discussed with him the fact that loss of 5 - 10% of his  current body weight will have the most impact on his diabetes  management.  Exercise, and detailed carbohydrates information provided  -  detailed on discharge instructions.  5) Chronic Care/Health Maintenance: -he is on ACEI/ARB and Statin medications and is encouraged to initiate and continue to follow up with Ophthalmology, Dentist, Podiatrist at least yearly or according to recommendations, and advised to *** stay away from smoking. I have recommended yearly flu vaccine and pneumonia vaccine at least every 5 years; moderate intensity exercise for up to 150 minutes weekly; and sleep for at least 7 hours a day.  - he is advised to maintain close follow up with Duanne Butler DASEN, MD for primary care needs, as well as his other providers for optimal and coordinated care.   - Time spent in this patient care: 60 min, which was spent in counseling him about his diabetes and the rest reviewing his blood glucose logs, discussing his hypoglycemia and hyperglycemia episodes, reviewing his current and previous labs/studies (including abstraction from other facilities) and medications doses and developing a long term treatment plan based on the latest standards of care/guidelines; and documenting his care.    Please refer to Patient Instructions for Blood Glucose Monitoring and Insulin /Medications Dosing Guide in media tab for additional information. Please also refer to Patient Self Inventory in the Media tab for reviewed elements of pertinent patient history.  Steffan DELENA Boom participated in the discussions, expressed understanding, and voiced agreement with the above plans.  All questions were answered to his satisfaction. he is encouraged to contact clinic should he have any questions or concerns prior to his return visit.     Follow up plan: - No follow-ups on file.    Benton Rio, [facility] [address] [place], [place] [address] Phone: [phone] Fax: [phone]  10/30/2024, 7:42 AM

## 2024-11-11 ENCOUNTER — Other Ambulatory Visit (HOSPITAL_COMMUNITY): Payer: Self-pay

## 2024-12-28 ENCOUNTER — Other Ambulatory Visit: Payer: Self-pay | Admitting: Family Medicine

## 2025-01-03 ENCOUNTER — Other Ambulatory Visit: Payer: Self-pay | Admitting: Family Medicine

## 2025-01-03 NOTE — Telephone Encounter (Unsigned)
 Copied from CRM #[id number]. Topic: Clinical - Medication Refill >> Jan 03, 2025 11:01 AM Zebedee SAUNDERS wrote: Medication: diltiazem  (CARDIZEM  CD) 300 MG 24 hr capsule,metFORMIN  (GLUCOPHAGE ) 500 MG tablet, omeprazole  (PRILOSEC) 40 MG capsule,   Has the patient contacted their pharmacy? Yes (Agent: If no, request that the patient contact the pharmacy for the refill. If patient does not wish to contact the pharmacy document the reason why and proceed with request.) (Agent: If yes, when and what did the pharmacy advise?)Pharmacy need script  This is the patient's preferred pharmacy:  [facility] [place], [place] - [id number] [place] [address] Phone: [phone] Fax: [phone]  Is this the correct pharmacy for this prescription? Yes If no, delete pharmacy and type the correct one.   Has the prescription been filled recently? Yes  Is the patient out of the medication? Yes  Has the patient been seen for an appointment in the last year OR does the patient have an upcoming appointment? Yes  Can we respond through MyChart? Yes  Agent: Please be advised that Rx refills may take up to 3 business days. We ask that you follow-up with your pharmacy.

## 2025-01-06 MED ORDER — OMEPRAZOLE 40 MG PO CPDR: 40.0000 mg | DELAYED_RELEASE_CAPSULE | Freq: Every day | ORAL | 1 refills | Status: AC

## 2025-01-06 MED ORDER — METFORMIN HCL 500 MG PO TABS: 500.0000 mg | ORAL_TABLET | Freq: Two times a day (BID) | ORAL | 1 refills | Status: AC

## 2025-01-06 MED ORDER — DILTIAZEM HCL ER COATED BEADS 300 MG PO CP24: 300.0000 mg | ORAL_CAPSULE | Freq: Every day | ORAL | 1 refills | Status: AC

## 2025-01-06 NOTE — Telephone Encounter (Signed)
 Requested medication (s) are due for refill today: routing for review  Requested medication (s) are on the active medication list: no  Last refill:  multiple dates  Future visit scheduled: yes  Notes to clinic:  historical medication     Requested Prescriptions  Pending Prescriptions Disp Refills   diltiazem  (CARDIZEM  CD) 300 MG 24 hr capsule      Sig: Take 1 capsule (300 mg total) by mouth at bedtime.     Cardiovascular: Calcium  Channel Blockers 3 Failed - 01/06/2025 11:10 AM      Failed - Last BP in normal range    BP Readings from Last 1 Encounters:  09/19/24 (!) 151/80         Passed - ALT in normal range and within 360 days    ALT  Date Value Ref Range Status  09/16/2024 13 9 - 46 U/L Final         Passed - AST in normal range and within 360 days    AST  Date Value Ref Range Status  09/16/2024 12 10 - 35 U/L Final         Passed - Cr in normal range and within 360 days    Creat  Date Value Ref Range Status  09/16/2024 0.81 0.70 - 1.35 mg/dL Final   Creatinine, Ser  Date Value Ref Range Status  09/18/2024 0.84 0.76 - 1.27 mg/dL Final   Creatinine, Urine  Date Value Ref Range Status  09/16/2024 90 20 - 320 mg/dL Final         Passed - Last Heart Rate in normal range    Pulse Readings from Last 1 Encounters:  09/19/24 (!) 51         Passed - Valid encounter within last 6 months    Recent Outpatient Visits           3 months ago Coronary artery disease involving native coronary artery of native heart, unspecified whether angina present   [facility] Medicine Pickard, Butler DASEN, MD               metFORMIN  (GLUCOPHAGE ) 500 MG tablet      Sig: Take 1 tablet (500 mg total) by mouth 2 (two) times daily.     Endocrinology:  Diabetes - Biguanides Failed - 01/06/2025 11:10 AM      Failed - HBA1C is between 0 and 7.9 and within 180 days    Hgb A1c MFr Bld  Date Value Ref Range Status  09/16/2024 8.1 (H) <5.7 % Final    Comment:     For someone without known diabetes, a hemoglobin A1c value of 6.5% or greater indicates that they may have  diabetes and this should be confirmed with a follow-up  test. . For someone with known diabetes, a value <7% indicates  that their diabetes is well controlled and a value  greater than or equal to 7% indicates suboptimal  control. A1c targets should be individualized based on  duration of diabetes, age, comorbid conditions, and  other considerations. . Currently, no consensus exists regarding use of hemoglobin A1c for diagnosis of diabetes for children. .          Passed - Cr in normal range and within 360 days    Creat  Date Value Ref Range Status  09/16/2024 0.81 0.70 - 1.35 mg/dL Final   Creatinine, Ser  Date Value Ref Range Status  09/18/2024 0.84 0.76 - 1.27 mg/dL Final   Creatinine,  Urine  Date Value Ref Range Status  09/16/2024 90 20 - 320 mg/dL Final         Passed - eGFR in normal range and within 360 days    GFR calc Af Amer  Date Value Ref Range Status  03/28/2018 >60 >60 mL/min Final    Comment:    (NOTE) The eGFR has been calculated using the CKD EPI equation. This calculation has not been validated in all clinical situations. eGFR's persistently <60 mL/min signify possible Chronic Kidney Disease.    GFR, Estimated  Date Value Ref Range Status  03/28/2024 >60 >60 mL/min Final    Comment:    (NOTE) Calculated using the CKD-EPI Creatinine Equation (2021)    eGFR  Date Value Ref Range Status  09/18/2024 100 >59 mL/min/1.73 Final         Passed - B12 Level in normal range and within 720 days    Vitamin B-12  Date Value Ref Range Status  09/16/2024 298 200 - 1,100 pg/mL Final    Comment:    . Please Note: Although the reference range for vitamin B12 is [phone] pg/mL, it has been reported that between 5 and 10% of patients with values between 200 and 400 pg/mL may experience neuropsychiatric and hematologic abnormalities due to occult  B12 deficiency; less than 1% of patients with values above 400 pg/mL will have symptoms. SABRA Amy - Valid encounter within last 6 months    Recent Outpatient Visits           3 months ago Coronary artery disease involving native coronary artery of native heart, unspecified whether angina present   [facility] Medicine Pickard, Butler DASEN, MD              Passed - CBC within normal limits and completed in the last 12 months    WBC  Date Value Ref Range Status  09/18/2024 9.3 3.4 - 10.8 x10E3/uL Final  09/16/2024 8.7 3.8 - 10.8 Thousand/uL Final   RBC  Date Value Ref Range Status  09/18/2024 4.00 (L) 4.14 - 5.80 x10E6/uL Final  09/16/2024 3.79 (L) 4.20 - 5.80 Million/uL Final   Hemoglobin  Date Value Ref Range Status  09/18/2024 9.6 (L) 13.0 - 17.7 g/dL Final   Hematocrit  Date Value Ref Range Status  09/18/2024 32.2 (L) 37.5 - 51.0 % Final   MCHC  Date Value Ref Range Status  09/18/2024 29.8 (L) 31.5 - 35.7 g/dL Final  90/77/7974 69.1 (L) 32.0 - 36.0 g/dL Final    Comment:    For adults, a slight decrease in the calculated MCHC value (in the range of 30 to 32 g/dL) is most likely not clinically significant; however, it should be interpreted with caution in correlation with other red cell parameters and the patient's clinical condition.    [facility]  Date Value Ref Range Status  09/18/2024 24.0 (L) 26.6 - 33.0 pg Final  09/16/2024 24.3 (L) 27.0 - 33.0 pg Final   MCV  Date Value Ref Range Status  09/18/2024 81 79 - 97 fL Final   No results found for: PLTCOUNTKUC, LABPLAT, POCPLA RDW  Date Value Ref Range Status  09/18/2024 14.9 11.6 - 15.4 % Final          omeprazole  (PRILOSEC) 40 MG capsule      Sig: Take 1 capsule (40 mg total) by mouth daily.     Gastroenterology: Proton Pump Inhibitors Passed -  01/06/2025 11:10 AM      Passed - Valid encounter within last 12 months    Recent Outpatient Visits           3 months ago  Coronary artery disease involving native coronary artery of native heart, unspecified whether angina present   [facility] Medicine Pickard, Butler DASEN, MD

## 2025-01-06 NOTE — Telephone Encounter (Signed)
 Requested medication (s) are due for refill today: routing for review  Requested medication (s) are on the active medication list: no  Last refill:  multiple dates  Future visit scheduled: yes  Notes to clinic:  historical medication     Requested Prescriptions  Pending Prescriptions Disp Refills   diltiazem  (CARDIZEM  CD) 300 MG 24 hr capsule      Sig: Take 1 capsule (300 mg total) by mouth at bedtime.     Cardiovascular: Calcium  Channel Blockers 3 Failed - 01/06/2025 11:45 AM      Failed - Last BP in normal range    BP Readings from Last 1 Encounters:  09/19/24 (!) 151/80         Passed - ALT in normal range and within 360 days    ALT  Date Value Ref Range Status  09/16/2024 13 9 - 46 U/L Final         Passed - AST in normal range and within 360 days    AST  Date Value Ref Range Status  09/16/2024 12 10 - 35 U/L Final         Passed - Cr in normal range and within 360 days    Creat  Date Value Ref Range Status  09/16/2024 0.81 0.70 - 1.35 mg/dL Final   Creatinine, Ser  Date Value Ref Range Status  09/18/2024 0.84 0.76 - 1.27 mg/dL Final   Creatinine, Urine  Date Value Ref Range Status  09/16/2024 90 20 - 320 mg/dL Final         Passed - Last Heart Rate in normal range    Pulse Readings from Last 1 Encounters:  09/19/24 (!) 51         Passed - Valid encounter within last 6 months    Recent Outpatient Visits           3 months ago Coronary artery disease involving native coronary artery of native heart, unspecified whether angina present   [facility] Medicine Pickard, Butler DASEN, MD               metFORMIN  (GLUCOPHAGE ) 500 MG tablet      Sig: Take 1 tablet (500 mg total) by mouth 2 (two) times daily.     Endocrinology:  Diabetes - Biguanides Failed - 01/06/2025 11:45 AM      Failed - HBA1C is between 0 and 7.9 and within 180 days    Hgb A1c MFr Bld  Date Value Ref Range Status  09/16/2024 8.1 (H) <5.7 % Final    Comment:     For someone without known diabetes, a hemoglobin A1c value of 6.5% or greater indicates that they may have  diabetes and this should be confirmed with a follow-up  test. . For someone with known diabetes, a value <7% indicates  that their diabetes is well controlled and a value  greater than or equal to 7% indicates suboptimal  control. A1c targets should be individualized based on  duration of diabetes, age, comorbid conditions, and  other considerations. . Currently, no consensus exists regarding use of hemoglobin A1c for diagnosis of diabetes for children. .          Passed - Cr in normal range and within 360 days    Creat  Date Value Ref Range Status  09/16/2024 0.81 0.70 - 1.35 mg/dL Final   Creatinine, Ser  Date Value Ref Range Status  09/18/2024 0.84 0.76 - 1.27 mg/dL Final   Creatinine,  Urine  Date Value Ref Range Status  09/16/2024 90 20 - 320 mg/dL Final         Passed - eGFR in normal range and within 360 days    GFR calc Af Amer  Date Value Ref Range Status  03/28/2018 >60 >60 mL/min Final    Comment:    (NOTE) The eGFR has been calculated using the CKD EPI equation. This calculation has not been validated in all clinical situations. eGFR's persistently <60 mL/min signify possible Chronic Kidney Disease.    GFR, Estimated  Date Value Ref Range Status  03/28/2024 >60 >60 mL/min Final    Comment:    (NOTE) Calculated using the CKD-EPI Creatinine Equation (2021)    eGFR  Date Value Ref Range Status  09/18/2024 100 >59 mL/min/1.73 Final         Passed - B12 Level in normal range and within 720 days    Vitamin B-12  Date Value Ref Range Status  09/16/2024 298 200 - 1,100 pg/mL Final    Comment:    . Please Note: Although the reference range for vitamin B12 is [phone] pg/mL, it has been reported that between 5 and 10% of patients with values between 200 and 400 pg/mL may experience neuropsychiatric and hematologic abnormalities due to occult  B12 deficiency; less than 1% of patients with values above 400 pg/mL will have symptoms. SABRA Amy - Valid encounter within last 6 months    Recent Outpatient Visits           3 months ago Coronary artery disease involving native coronary artery of native heart, unspecified whether angina present   [place] [facility] Medicine Pickard, Butler DASEN, MD              Passed - CBC within normal limits and completed in the last 12 months    WBC  Date Value Ref Range Status  09/18/2024 9.3 3.4 - 10.8 x10E3/uL Final  09/16/2024 8.7 3.8 - 10.8 Thousand/uL Final   RBC  Date Value Ref Range Status  09/18/2024 4.00 (L) 4.14 - 5.80 x10E6/uL Final  09/16/2024 3.79 (L) 4.20 - 5.80 Million/uL Final   Hemoglobin  Date Value Ref Range Status  09/18/2024 9.6 (L) 13.0 - 17.7 g/dL Final   Hematocrit  Date Value Ref Range Status  09/18/2024 32.2 (L) 37.5 - 51.0 % Final   MCHC  Date Value Ref Range Status  09/18/2024 29.8 (L) 31.5 - 35.7 g/dL Final  90/77/7974 69.1 (L) 32.0 - 36.0 g/dL Final    Comment:    For adults, a slight decrease in the calculated MCHC value (in the range of 30 to 32 g/dL) is most likely not clinically significant; however, it should be interpreted with caution in correlation with other red cell parameters and the patient's clinical condition.    [facility]  Date Value Ref Range Status  09/18/2024 24.0 (L) 26.6 - 33.0 pg Final  09/16/2024 24.3 (L) 27.0 - 33.0 pg Final   MCV  Date Value Ref Range Status  09/18/2024 81 79 - 97 fL Final   No results found for: PLTCOUNTKUC, LABPLAT, POCPLA RDW  Date Value Ref Range Status  09/18/2024 14.9 11.6 - 15.4 % Final          omeprazole  (PRILOSEC) 40 MG capsule      Sig: Take 1 capsule (40 mg total) by mouth daily.     Gastroenterology: Proton Pump Inhibitors Passed -  01/06/2025 11:45 AM      Passed - Valid encounter within last 12 months    Recent Outpatient Visits           3 months ago  Coronary artery disease involving native coronary artery of native heart, unspecified whether angina present   [place] [facility] Medicine Pickard, Butler DASEN, MD

## 2025-01-14 ENCOUNTER — Other Ambulatory Visit: Payer: Self-pay

## 2025-01-14 ENCOUNTER — Telehealth: Payer: Self-pay | Admitting: Family Medicine

## 2025-01-14 DIAGNOSIS — J302 Other seasonal allergic rhinitis: Secondary | ICD-10-CM

## 2025-01-14 MED ORDER — FLUTICASONE PROPIONATE 50 MCG/ACT NA SUSP: 2.0000 | Freq: Every day | NASAL | 2 refills | Status: AC

## 2025-01-14 NOTE — Telephone Encounter (Signed)
 Prescription Request  01/14/2025  LOV: 09/16/2024  What is the name of the medication or equipment? fluticasone  (FLONASE ) 50 MCG/ACT nasal spray   Have you contacted your pharmacy to request a refill? Yes   Which pharmacy would you like this sent to?  [facility] [place], [place] - [id number] [place] [address] Phone: [phone] Fax: [phone]    Patient notified that their request is being sent to the clinical staff for review and that they should receive a response within 2 business days.   Please advise at [facility] [phone]

## 2025-01-16 ENCOUNTER — Telehealth: Payer: Self-pay

## 2025-01-16 NOTE — Telephone Encounter (Signed)
 Prescription Request  01/16/2025  LOV: 09/16/24  What is the name of the medication or equipment? losartan -hydrochlorothiazide  (HYZAAR) 100-25 MG tablet [[id number]]   Have you contacted your pharmacy to request a refill? Yes   Which pharmacy would you like this sent to?  [facility] [place], [place] - [id number] [place] [address] Phone: [phone] Fax: [phone]    Patient notified that their request is being sent to the clinical staff for review and that they should receive a response within 2 business days.   Please advise at [facility] [phone]

## 2025-09-16 ENCOUNTER — Other Ambulatory Visit

## 2025-09-18 ENCOUNTER — Encounter: Admitting: Family Medicine
